# Patient Record
Sex: Female | Born: 1955
Health system: Southern US, Community
[De-identification: ages and names within clinical notes are randomized; demographics above are authoritative.]

## PROBLEM LIST (undated history)

## (undated) DIAGNOSIS — N9089 Other specified noninflammatory disorders of vulva and perineum: Secondary | ICD-10-CM

## (undated) DIAGNOSIS — D369 Benign neoplasm, unspecified site: Secondary | ICD-10-CM

## (undated) DIAGNOSIS — Z8719 Personal history of other diseases of the digestive system: Secondary | ICD-10-CM

## (undated) DIAGNOSIS — K7460 Unspecified cirrhosis of liver: Secondary | ICD-10-CM

## (undated) DIAGNOSIS — Z87440 Personal history of urinary (tract) infections: Secondary | ICD-10-CM

## (undated) DIAGNOSIS — C183 Malignant neoplasm of hepatic flexure: Secondary | ICD-10-CM

## (undated) DIAGNOSIS — D696 Thrombocytopenia, unspecified: Secondary | ICD-10-CM

## (undated) DIAGNOSIS — Z87442 Personal history of urinary calculi: Secondary | ICD-10-CM

## (undated) DIAGNOSIS — N200 Calculus of kidney: Secondary | ICD-10-CM

## (undated) DIAGNOSIS — K649 Unspecified hemorrhoids: Secondary | ICD-10-CM

## (undated) DIAGNOSIS — L732 Hidradenitis suppurativa: Secondary | ICD-10-CM

## (undated) DIAGNOSIS — R161 Splenomegaly, not elsewhere classified: Secondary | ICD-10-CM

## (undated) DIAGNOSIS — K5732 Diverticulitis of large intestine without perforation or abscess without bleeding: Secondary | ICD-10-CM

## (undated) DIAGNOSIS — R7303 Prediabetes: Secondary | ICD-10-CM

## (undated) DIAGNOSIS — K8020 Calculus of gallbladder without cholecystitis without obstruction: Secondary | ICD-10-CM

## (undated) DIAGNOSIS — B1920 Unspecified viral hepatitis C without hepatic coma: Secondary | ICD-10-CM

## (undated) DIAGNOSIS — R002 Palpitations: Secondary | ICD-10-CM

## (undated) DIAGNOSIS — R772 Abnormality of alphafetoprotein: Secondary | ICD-10-CM

## (undated) DIAGNOSIS — D126 Benign neoplasm of colon, unspecified: Principal | ICD-10-CM

## (undated) DIAGNOSIS — R319 Hematuria, unspecified: Secondary | ICD-10-CM

## (undated) HISTORY — PX: BREAST EXCISIONAL BIOPSY: SUR124

## (undated) HISTORY — DX: Palpitations: R00.2

## (undated) HISTORY — DX: Personal history of urinary (tract) infections: Z87.440

## (undated) HISTORY — DX: Hematuria, unspecified: R31.9

## (undated) HISTORY — DX: Hidradenitis suppurativa: L73.2

## (undated) HISTORY — DX: Diverticulitis of large intestine without perforation or abscess without bleeding: K57.32

## (undated) HISTORY — DX: Benign neoplasm of colon, unspecified: D12.6

## (undated) HISTORY — DX: Unspecified hemorrhoids: K64.9

## (undated) HISTORY — DX: Thrombocytopenia, unspecified: D69.6

## (undated) HISTORY — DX: Abnormality of alphafetoprotein: R77.2

## (undated) HISTORY — DX: Benign neoplasm, unspecified site: D36.9

## (undated) HISTORY — DX: Prediabetes: R73.03

## (undated) HISTORY — DX: Splenomegaly, not elsewhere classified: R16.1

## (undated) HISTORY — DX: Other specified noninflammatory disorders of vulva and perineum: N90.89

## (undated) HISTORY — PX: HEMORRHOID BANDING: SHX5850

## (undated) HISTORY — DX: Unspecified cirrhosis of liver: K74.60

## (undated) HISTORY — DX: Calculus of kidney: N20.0

## (undated) HISTORY — DX: Unspecified viral hepatitis C without hepatic coma: B19.20

## (undated) HISTORY — PX: SKIN LESION EXCISION: SHX2412

---

## 1977-05-04 HISTORY — PX: AXILLARY SURGERY: SHX892

## 2000-09-01 ENCOUNTER — Emergency Department (HOSPITAL_COMMUNITY): Admission: EM | Admit: 2000-09-01 | Discharge: 2000-09-01 | Payer: Self-pay | Admitting: *Deleted

## 2000-09-08 ENCOUNTER — Other Ambulatory Visit: Admission: RE | Admit: 2000-09-08 | Discharge: 2000-09-08 | Payer: Self-pay | Admitting: Internal Medicine

## 2000-09-27 ENCOUNTER — Ambulatory Visit (HOSPITAL_COMMUNITY): Admission: RE | Admit: 2000-09-27 | Discharge: 2000-09-27 | Payer: Self-pay | Admitting: Internal Medicine

## 2000-09-27 ENCOUNTER — Encounter: Payer: Self-pay | Admitting: Internal Medicine

## 2000-10-01 ENCOUNTER — Ambulatory Visit (HOSPITAL_COMMUNITY): Admission: RE | Admit: 2000-10-01 | Discharge: 2000-10-01 | Payer: Self-pay | Admitting: Internal Medicine

## 2000-10-01 ENCOUNTER — Encounter: Payer: Self-pay | Admitting: Internal Medicine

## 2001-01-06 ENCOUNTER — Encounter: Payer: Self-pay | Admitting: Internal Medicine

## 2001-01-06 ENCOUNTER — Ambulatory Visit (HOSPITAL_COMMUNITY): Admission: RE | Admit: 2001-01-06 | Discharge: 2001-01-06 | Payer: Self-pay | Admitting: Internal Medicine

## 2001-10-07 ENCOUNTER — Encounter: Payer: Self-pay | Admitting: Internal Medicine

## 2001-10-07 ENCOUNTER — Ambulatory Visit (HOSPITAL_COMMUNITY): Admission: RE | Admit: 2001-10-07 | Discharge: 2001-10-07 | Payer: Self-pay | Admitting: Internal Medicine

## 2001-10-29 ENCOUNTER — Inpatient Hospital Stay (HOSPITAL_COMMUNITY): Admission: EM | Admit: 2001-10-29 | Discharge: 2001-10-30 | Payer: Self-pay | Admitting: Emergency Medicine

## 2001-10-29 ENCOUNTER — Encounter: Payer: Self-pay | Admitting: Emergency Medicine

## 2001-10-31 ENCOUNTER — Encounter: Payer: Self-pay | Admitting: Family Medicine

## 2001-10-31 ENCOUNTER — Ambulatory Visit (HOSPITAL_COMMUNITY): Admission: RE | Admit: 2001-10-31 | Discharge: 2001-10-31 | Payer: Self-pay | Admitting: Family Medicine

## 2002-04-24 ENCOUNTER — Encounter: Payer: Self-pay | Admitting: Family Medicine

## 2002-04-24 ENCOUNTER — Ambulatory Visit (HOSPITAL_COMMUNITY): Admission: RE | Admit: 2002-04-24 | Discharge: 2002-04-24 | Payer: Self-pay | Admitting: Family Medicine

## 2002-11-07 ENCOUNTER — Ambulatory Visit (HOSPITAL_COMMUNITY): Admission: RE | Admit: 2002-11-07 | Discharge: 2002-11-07 | Payer: Self-pay | Admitting: Internal Medicine

## 2002-11-07 ENCOUNTER — Encounter: Payer: Self-pay | Admitting: Internal Medicine

## 2002-12-05 ENCOUNTER — Encounter: Payer: Self-pay | Admitting: Family Medicine

## 2002-12-05 ENCOUNTER — Ambulatory Visit (HOSPITAL_COMMUNITY): Admission: RE | Admit: 2002-12-05 | Discharge: 2002-12-05 | Payer: Self-pay | Admitting: Family Medicine

## 2003-01-05 ENCOUNTER — Ambulatory Visit (HOSPITAL_COMMUNITY): Admission: RE | Admit: 2003-01-05 | Discharge: 2003-01-05 | Payer: Self-pay | Admitting: Family Medicine

## 2003-01-05 ENCOUNTER — Encounter: Payer: Self-pay | Admitting: Family Medicine

## 2003-04-05 ENCOUNTER — Ambulatory Visit (HOSPITAL_COMMUNITY): Admission: RE | Admit: 2003-04-05 | Discharge: 2003-04-05 | Payer: Self-pay | Admitting: Family Medicine

## 2003-08-01 ENCOUNTER — Ambulatory Visit (HOSPITAL_COMMUNITY): Admission: RE | Admit: 2003-08-01 | Discharge: 2003-08-01 | Payer: Self-pay | Admitting: Family Medicine

## 2003-11-13 ENCOUNTER — Ambulatory Visit (HOSPITAL_COMMUNITY): Admission: RE | Admit: 2003-11-13 | Discharge: 2003-11-13 | Payer: Self-pay | Admitting: Family Medicine

## 2004-06-18 ENCOUNTER — Ambulatory Visit (HOSPITAL_COMMUNITY): Admission: RE | Admit: 2004-06-18 | Discharge: 2004-06-18 | Payer: Self-pay | Admitting: Family Medicine

## 2005-01-06 ENCOUNTER — Ambulatory Visit (HOSPITAL_COMMUNITY): Admission: RE | Admit: 2005-01-06 | Discharge: 2005-01-06 | Payer: Self-pay | Admitting: Internal Medicine

## 2005-01-21 ENCOUNTER — Ambulatory Visit (HOSPITAL_COMMUNITY): Admission: RE | Admit: 2005-01-21 | Discharge: 2005-01-21 | Payer: Self-pay | Admitting: Family Medicine

## 2005-01-26 ENCOUNTER — Ambulatory Visit (HOSPITAL_COMMUNITY): Admission: RE | Admit: 2005-01-26 | Discharge: 2005-01-26 | Payer: Self-pay | Admitting: Family Medicine

## 2005-07-31 ENCOUNTER — Ambulatory Visit: Payer: Self-pay | Admitting: Internal Medicine

## 2005-07-31 ENCOUNTER — Ambulatory Visit (HOSPITAL_COMMUNITY): Admission: RE | Admit: 2005-07-31 | Discharge: 2005-07-31 | Payer: Self-pay | Admitting: Internal Medicine

## 2005-07-31 HISTORY — PX: COLONOSCOPY: SHX174

## 2005-09-17 ENCOUNTER — Ambulatory Visit: Payer: Self-pay | Admitting: Internal Medicine

## 2005-10-20 ENCOUNTER — Ambulatory Visit (HOSPITAL_COMMUNITY): Admission: RE | Admit: 2005-10-20 | Discharge: 2005-10-20 | Payer: Self-pay | Admitting: Internal Medicine

## 2005-11-20 ENCOUNTER — Ambulatory Visit: Payer: Self-pay | Admitting: Gastroenterology

## 2006-01-18 ENCOUNTER — Ambulatory Visit (HOSPITAL_COMMUNITY): Admission: RE | Admit: 2006-01-18 | Discharge: 2006-01-18 | Payer: Self-pay | Admitting: Gastroenterology

## 2006-01-18 ENCOUNTER — Encounter (INDEPENDENT_AMBULATORY_CARE_PROVIDER_SITE_OTHER): Payer: Self-pay | Admitting: *Deleted

## 2006-01-22 ENCOUNTER — Ambulatory Visit: Payer: Self-pay | Admitting: Gastroenterology

## 2006-08-26 ENCOUNTER — Ambulatory Visit: Payer: Self-pay | Admitting: Gastroenterology

## 2006-10-23 ENCOUNTER — Encounter: Admission: RE | Admit: 2006-10-23 | Discharge: 2006-10-23 | Payer: Self-pay | Admitting: Gastroenterology

## 2007-12-22 ENCOUNTER — Other Ambulatory Visit: Admission: RE | Admit: 2007-12-22 | Discharge: 2007-12-22 | Payer: Self-pay | Admitting: Obstetrics and Gynecology

## 2007-12-27 ENCOUNTER — Encounter (HOSPITAL_COMMUNITY): Admission: RE | Admit: 2007-12-27 | Discharge: 2008-01-26 | Payer: Self-pay | Admitting: Obstetrics & Gynecology

## 2008-07-04 ENCOUNTER — Ambulatory Visit (HOSPITAL_COMMUNITY): Admission: RE | Admit: 2008-07-04 | Discharge: 2008-07-04 | Payer: Self-pay | Admitting: Obstetrics & Gynecology

## 2008-08-30 ENCOUNTER — Ambulatory Visit: Payer: Self-pay | Admitting: Gastroenterology

## 2008-09-08 ENCOUNTER — Other Ambulatory Visit: Admission: RE | Admit: 2008-09-08 | Discharge: 2008-09-08 | Payer: Self-pay | Admitting: Gastroenterology

## 2008-09-26 ENCOUNTER — Encounter: Payer: Self-pay | Admitting: Internal Medicine

## 2008-09-28 ENCOUNTER — Encounter: Admission: RE | Admit: 2008-09-28 | Discharge: 2008-09-28 | Payer: Self-pay | Admitting: Gastroenterology

## 2008-10-09 ENCOUNTER — Ambulatory Visit (HOSPITAL_COMMUNITY): Admission: RE | Admit: 2008-10-09 | Discharge: 2008-10-09 | Payer: Self-pay | Admitting: Family Medicine

## 2009-01-23 ENCOUNTER — Other Ambulatory Visit: Admission: RE | Admit: 2009-01-23 | Discharge: 2009-01-23 | Payer: Self-pay | Admitting: Obstetrics and Gynecology

## 2009-01-24 ENCOUNTER — Ambulatory Visit (HOSPITAL_COMMUNITY): Payer: Self-pay | Admitting: Internal Medicine

## 2009-01-24 ENCOUNTER — Encounter (HOSPITAL_COMMUNITY): Admission: RE | Admit: 2009-01-24 | Discharge: 2009-01-30 | Payer: Self-pay | Admitting: Oncology

## 2009-01-24 ENCOUNTER — Encounter: Payer: Self-pay | Admitting: Internal Medicine

## 2009-01-25 ENCOUNTER — Ambulatory Visit (HOSPITAL_COMMUNITY): Admission: RE | Admit: 2009-01-25 | Discharge: 2009-01-25 | Payer: Self-pay | Admitting: Obstetrics & Gynecology

## 2009-05-04 HISTORY — PX: BREAST BIOPSY: SHX20

## 2009-08-08 ENCOUNTER — Ambulatory Visit: Payer: Self-pay | Admitting: Gastroenterology

## 2009-08-19 ENCOUNTER — Ambulatory Visit (HOSPITAL_COMMUNITY): Admission: RE | Admit: 2009-08-19 | Discharge: 2009-08-19 | Payer: Self-pay | Admitting: Internal Medicine

## 2009-08-26 ENCOUNTER — Encounter: Admission: RE | Admit: 2009-08-26 | Discharge: 2009-08-26 | Payer: Self-pay | Admitting: Internal Medicine

## 2009-08-27 ENCOUNTER — Ambulatory Visit (HOSPITAL_COMMUNITY): Admission: RE | Admit: 2009-08-27 | Discharge: 2009-08-27 | Payer: Self-pay | Admitting: Family Medicine

## 2009-08-28 ENCOUNTER — Encounter: Admission: RE | Admit: 2009-08-28 | Discharge: 2009-08-28 | Payer: Self-pay | Admitting: Family Medicine

## 2009-09-20 ENCOUNTER — Encounter: Payer: Self-pay | Admitting: Internal Medicine

## 2009-10-02 ENCOUNTER — Other Ambulatory Visit: Admission: RE | Admit: 2009-10-02 | Discharge: 2009-10-02 | Payer: Self-pay | Admitting: Obstetrics and Gynecology

## 2009-10-21 ENCOUNTER — Encounter: Payer: Self-pay | Admitting: Internal Medicine

## 2009-11-19 ENCOUNTER — Emergency Department (HOSPITAL_COMMUNITY): Admission: EM | Admit: 2009-11-19 | Discharge: 2009-11-19 | Payer: Self-pay | Admitting: Emergency Medicine

## 2010-01-23 ENCOUNTER — Encounter (HOSPITAL_COMMUNITY)
Admission: RE | Admit: 2010-01-23 | Discharge: 2010-02-22 | Payer: Self-pay | Source: Home / Self Care | Admitting: Oncology

## 2010-01-23 ENCOUNTER — Ambulatory Visit (HOSPITAL_COMMUNITY): Payer: Self-pay | Admitting: Oncology

## 2010-05-25 ENCOUNTER — Encounter: Payer: Self-pay | Admitting: Family Medicine

## 2010-05-25 ENCOUNTER — Encounter: Payer: Self-pay | Admitting: Internal Medicine

## 2010-05-25 ENCOUNTER — Encounter: Payer: Self-pay | Admitting: Obstetrics and Gynecology

## 2010-05-25 ENCOUNTER — Encounter: Payer: Self-pay | Admitting: Gastroenterology

## 2010-06-05 NOTE — Letter (Signed)
Summary: [facility]  [facility]   Imported By: Ave Filter 09/20/2009 09:56:32  _____________________________________________________________________  External Attachment:    Type:   Image     Comment:   External Document

## 2010-06-05 NOTE — Letter (Signed)
Summary: [facility]  [facility]   Imported By: Rosine Beat 10/21/2009 10:36:38  _____________________________________________________________________  External Attachment:    Type:   Image     Comment:   External Document

## 2010-07-17 LAB — CBC
HCT: 40 % (ref 36.0–46.0)
MCV: 96.8 fL (ref 78.0–100.0)
Platelets: 109 10*3/uL — ABNORMAL LOW (ref 150–400)
RDW: 13 % (ref 11.5–15.5)
WBC: 4.4 10*3/uL (ref 4.0–10.5)

## 2010-07-17 LAB — DIFFERENTIAL
Basophils Relative: 1 % (ref 0–1)
Eosinophils Absolute: 0.1 10*3/uL (ref 0.0–0.7)
Monocytes Absolute: 0.5 10*3/uL (ref 0.1–1.0)
Neutrophils Relative %: 39 % — ABNORMAL LOW (ref 43–77)

## 2010-07-19 LAB — URINE CULTURE

## 2010-07-19 LAB — POCT URINALYSIS DIP (DEVICE)
Bilirubin Urine: NEGATIVE
Glucose, UA: NEGATIVE mg/dL
Ketones, ur: NEGATIVE mg/dL
Nitrite: NEGATIVE
Protein, ur: NEGATIVE mg/dL
Specific Gravity, Urine: 1.02 (ref 1.005–1.030)
Urobilinogen, UA: 0.2 mg/dL (ref 0.0–1.0)

## 2010-08-08 LAB — IRON AND TIBC
Iron: 222 ug/dL — ABNORMAL HIGH (ref 42–135)
UIBC: 117 ug/dL

## 2010-08-08 LAB — CBC
Hemoglobin: 13.9 g/dL (ref 12.0–15.0)
MCHC: 34.7 g/dL (ref 30.0–36.0)
MCV: 97.5 fL (ref 78.0–100.0)
Platelets: 109 10*3/uL — ABNORMAL LOW (ref 150–400)
RDW: 12.8 % (ref 11.5–15.5)

## 2010-08-08 LAB — DIFFERENTIAL
Basophils Relative: 1 % (ref 0–1)
Eosinophils Absolute: 0.1 10*3/uL (ref 0.0–0.7)
Eosinophils Relative: 2 % (ref 0–5)
Monocytes Absolute: 0.3 10*3/uL (ref 0.1–1.0)
Monocytes Relative: 8 % (ref 3–12)
Neutro Abs: 1.5 10*3/uL — ABNORMAL LOW (ref 1.7–7.7)
Neutrophils Relative %: 38 % — ABNORMAL LOW (ref 43–77)

## 2010-08-08 LAB — FERRITIN: Ferritin: 1446 ng/mL — ABNORMAL HIGH (ref 10–291)

## 2010-08-08 LAB — COMPREHENSIVE METABOLIC PANEL
ALT: 150 U/L — ABNORMAL HIGH (ref 0–35)
AST: 155 U/L — ABNORMAL HIGH (ref 0–37)
Alkaline Phosphatase: 92 U/L (ref 39–117)
BUN: 10 mg/dL (ref 6–23)
Calcium: 9.5 mg/dL (ref 8.4–10.5)
GFR calc non Af Amer: >60 mL/min (ref >60)
Glucose, Bld: 87 mg/dL (ref 70–99)
Potassium: 4.2 mEq/L (ref 3.5–5.1)

## 2010-09-19 NOTE — Op Note (Signed)
[name], [name]               ACCOUNT NO.:  [id number]   MEDICAL RECORD NO.:  [id number]          PATIENT TYPE:  AMB   LOCATION:  DAY                           FACILITY:  [facility]   PHYSICIAN:  R. Roetta Sessions, M.D. DATE OF BIRTH:  [birth year]   DATE OF PROCEDURE:  07/31/2005  DATE OF DISCHARGE:  07/31/2005                                 OPERATIVE REPORT   INDICATIONS FOR PROCEDURE:  The patient is a 55 year old African-American  female, seen by Dr. Nobie Putnam for colorectal cancer screening.  She has never  had her lower GI tract imaged.  She has no lower GI tract symptoms and there  is no family history of colorectal neoplasia.  Colonoscopy is now being done  as a screening maneuver.  This approach has been discussed with the patient  at length, potential risks, benefits and alternatives have been reviewed.  Questions answered and agreeable.  Please see documentation in medical  record.   PROCEDURE NOTE:  O2 saturation, blood pressure, pulse on this patient were  monitored throughout the entire procedure.  Conscious sedation Versed 3 mg,  Demerol 50 mg IV in divided doses.   INSTRUMENT:  Olympus video chip system.   FINDINGS:  Digital rectal exam revealed no abnormalities.  Endoscopic prep  was good.   Rectum:  Edematous rectal mucosa.  Retroflexion of the anal verge revealed  no abnormalities.   Colon:  Colonic mucosa was surveyed from the rectosigmoid junction through  the left, transverse, and right colon to the area of the appendiceal  orifice, ileocecal valve, and cecum. These structures were well seen and  photographed for the record. From this level, the scope was slowly  withdrawn, and all previously mentioned mucosal surfaces were again seen.  The colonic mucosa appeared normal.  The patient tolerated the procedure  well and was reactive to endoscopy.   IMPRESSION:  1.  Normal rectum.  2.  Normal colonoscopy.   RECOMMENDATIONS:  Repeat screening colonoscopy ten  years.      Ariana Long, M.D.  Electronically Signed     RMR/MEDQ  D:  07/31/2005  T:  08/03/2005  Job:  [id number]   cc:   Patrica Duel, M.D.  Fax: [phone]

## 2010-09-19 NOTE — H&P (Signed)
[facility]  Patient:    Ariana Long, Ariana Long Visit Number: [id number] MRN: [id number]          Service Type: OUT Location: RAD Attending Physician:  Kirk Ruths Dictated by:   Renne Musca, M.D. Admit Date:  10/31/2001                           History and Physical  DATE OF BIRTH:  Feb 08, 1956  HISTORY OF PRESENT ILLNESS:  The patient is a 55 year old African American female followed by Dr. Sherwood Gambler with past medical history remarkable for hepatitis C, chronic active, followed by Dr. Jena Gauss who has recently presented her PEG interferon/ribavirin therapy.  Three days ago after eating a hamburger developed epigastric fullness, nausea, no vomiting or other symptoms except burping.  She felt fatigued, but otherwise no other symptoms, and it resolved spontaneously.  Yesterday, while driving, she developed left upper arm pain without radiation, and some intermittent left anterior chest pain which persisted throughout most of the day until she fell asleep, and again, noted this morning, and then presented to the emergency room to be "checked out." The patient has no exertional symptoms.  She has had no previous symptoms. She says she has chronic fatigue, but no change in her exercise tolerance. She denies any respiratory complaint, GI complaints, or GU complaints.  She has noted some headache, but not anything out of the ordinary.  The patient is very stressed secondary to family issues as she is caring for her mother since her dad passed away suddenly about a year ago.  She has had no down time.  PAST MEDICAL HISTORY:  Hepatitis C on interferon which she just recently resumed which she takes q.Friday and ribavirin therapy.  She also has a history of a benign lymph node resection many years ago and C-section x2 without complication.  ALLERGIES:  No known drug allergies.  MEDICATIONS:  Interferon q.Friday, ribavirin three q.a.m. and two  q.p.m., Motrin b.i.d. post her interferon treatment, alpha lipoic acid, milk thistle, and multivitamins.  FAMILY MEDICAL HISTORY:  Father died at age 63 of an aortic aneurysm.  Mother is living.  She has diabetes, hypertension, and status post recent CABG.  Two sisters and one brother, alive and well.  Two sons alive and well.  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  No alcohol or tobacco abuse.  She is married with two children.  REVIEW OF SYSTEMS:  A 17 pound weight loss, though now is stable.  PHYSICAL EXAMINATION:  GENERAL:  Reveals a well-developed and well-nourished female who is alert and appropriate.  Neuro status is completely intact.  HEENT:  Oropharynx is moist.  Pupils are equal, round and reactive to light.  NECK:  Supple.  There is no JVD, adenopathy, and no bruits are noted.  The carotid upstrokes are full.  LUNGS:  Clear to auscultation anteriorly and posteriorly.  HEART:  Regular rate and rhythm.  There is a split S2.  No gallop is noted. No murmur is noted.  ABDOMEN:  Soft and nontender.  No mass is noted.  EXTREMITIES:  Without clubbing, cyanosis, or edema.  The dorsalis pedis is intact.  Femorals are intact and full.  SKIN:  Without rash, lesion, or breakdown.  LABORATORY DATA:  White count 2.9, hemoglobin 13, hematocrit 37, platelet count 117, 30 neutrophils, 64 lymphs.  CK 168, CK-MB is 6.7, troponin is 0.01. Her  relative index is 4.  Potassium 3, CO2 35, glucose is 113, SGOT 110, SGPT 95.  No comparisons are available.  Electrocardiogram reveals normal sinus rhythm.  There is some flattening in the T-waves in V3 through V6 and aVL, but no acute ischemic changes are noted. Chest x-ray is reportedly negative.  ASSESSMENT AND PLAN: 1. Chest pain in this 55 year old female who is postmenopausal with CK-MB    positive with normal CK.  Will do serial enzymes.  Most likely will need a    Cardiolite  provided that she remains stable.  The plan was discussed with    the patient. 2. Chronic hepatitis C.  Discuss with Dr. Jena Gauss.  We can hold the Rebetol over    the next day or two, which is what the patient is requesting.  There should    not be any adverse effects. 3. Leukopenia with an absolute neutrophil count of 900 secondary to interferon    and ribavirin therapy. 4. Thrombocytopenia, most likely secondary to her antiviral therapy, possibly    underlying disease, i.e., liver. 5. Stress with probable anxiety.  To consider grief counseling as the patient    becomes very tearful when discussing these issues.  Questioned if she has    ever tried on SSRI.  She certainly would respond to counseling in a    favorable way. 6. Hypokalemia with a CO2 of 35 which may be pseudo.  Will check magnesium,    check TSH.  Replete with 40 mEq of potassium this evening, and can be    followed up as per primary care physician. Dictated by:   Renne Musca, M.D. Attending Physician:  Kirk Ruths DD:  10/29/01 TD:  10/31/01 Job: [id number]

## 2010-09-19 NOTE — Discharge Summary (Signed)
[facility]  Patient:    Ariana Long, Ariana Long Visit Number: [id number] MRN: [id number]          Service Type: OUT Location: RAD Attending Physician:  Kirk Ruths Dictated by:   Karleen Hampshire, M.D. Admit Date:  10/31/2001 Disc. Date: 10/30/01                             Discharge Summary  DISCHARGE DIAGNOSES:  1. Chest pain, myocardial infarction ruled out.  2. Chronic hepatitis C.  HOSPITAL COURSE:  This 55 year old white female stated she had some left upper abdomen and left-sided nonspecific chest pain approximately a week before this admission.  Discomfort began after the patient ate a hamburger but seemed to be relieved by Tums.  The day before admission the patient had eaten a hotdog with subsequent discomfort in the left upper quadrant and left chest, and some mild dyspepsia, also associated with some left arm pain and left neck pain. The patient was seen and evaluated in the emergency room and admitted for rule out MI.  The patients EKG showed regular sinus rhythm with nonspecific ST changes.  Also, the patients cardiac enzymes serially were negative.  The patients WBC was 2900, hemoglobin 13.  The patient had had slightly low WBC before secondary to Rebetol and Interferon which she takes for her hepatitis C, which is followed by Dr. Jena Gauss.  The patient states chest pain was not relieved by nitroglycerin, it just gave her a headache.  She is pain-free after Protonix therapy.  The patient is a nonsmoker.  She does have a family history of coronary artery disease.  She states she has never had any lipid problems in the past.  The patient, again, is pain-free at the time of discharge.  DISPOSITION:  We will arrange for outpatient ultrasound of the gallbladder as well as outpatient stress Cardiolite.  DISCHARGE DIET:  The patient was told to avoid fatty foods.  DISCHARGE MEDICATIONS:  Otherwise, no new medications except  for Protonix.Dictated by:   Karleen Hampshire, M.D. Attending Physician:  Kirk Ruths DD:  10/30/01 TD:  11/01/01 Job: [id number]

## 2010-12-25 ENCOUNTER — Ambulatory Visit (INDEPENDENT_AMBULATORY_CARE_PROVIDER_SITE_OTHER): Payer: Self-pay | Admitting: Gastroenterology

## 2010-12-25 ENCOUNTER — Other Ambulatory Visit: Payer: Self-pay | Admitting: Gastroenterology

## 2010-12-25 DIAGNOSIS — B182 Chronic viral hepatitis C: Secondary | ICD-10-CM

## 2010-12-25 LAB — AFP TUMOR MARKER: AFP-Tumor Marker: 24.4 ng/mL — ABNORMAL HIGH (ref 0.0–8.0)

## 2011-01-08 NOTE — Progress Notes (Signed)
NAME:  Ariana Long, Ariana Long  MR#:  [id number]      DATE:  12/25/2010  DOB:  [birth year]    cc: Consulting Physician:  Glenford Peers, MD, [facility], [address], [place], [place] [address] Primary care physician:  Modena Nunnery, PA-C c/o Emelia Loron., MD, [facility], [address], [place], [place] [address], Fax [phone] Referring Physician:  Jonathon Bellows, MD, [facility], [address], [place], [place] [address], Fax [phone]    REASON FOR FOLLOW UP:  Genotype 1 hepatitis C induced cirrhosis.   History:  The patient returns today unaccompanied. She is a 55 year old woman with genotype 1 hepatitis C. She was treated with 3 months of pegylated interferon and ribavirin in the past, before she was  referred to [place], but had to stop and restart therapy due to cytopenias and other side effects such as tinnitus.  After 3 months her treatment was discontinued. After an incomplete course of therapy, she  was seen at [facility] in 2005 and was told there was no other therapy. She underwent a biopsy on 01/18/2006, showing grade 1-2, stage III disease. She has been untreated since that time. She was last seen on  08/08/2009. Subsequently, she returns without any symptoms referable to her history of hepatitis C. Although the notes from 08/08/2009 indicate that she was supposed to have an upper endoscopy to screen for varices , this was never done.  In addition, she was supposed to be scheduled for an MRI, which was also never done, so that her last imaging of the liver, that I have record of, was an MRI on 09/28/2008, as acknowledged today by the patient. There are no symptoms to suggest decompensated liver disease and there is no history of variceal  bleeding in the past. There is no history of symptoms to suggest cryoglobulin mediated disease.   Past medical history:  Otherwise significant for osteoporosis.    CURRENT MEDICATIONS:  Vitamin D 400 units p.o. daily, calcium 1800 mg p.o. daily. Milk thistle 250 mg p.o. daily, which she was suppose to stop at last visit, Luvena weekly, hydrocortisone 25 mg suppositories p.r.n.    allergies:  None.   habits:  Smoking never smoked. Alcohol, denies interval consumption.   REVIEW OF SYSTEMS:  All 10 systems reviewed today with the patient and they are negative other than which was mentioned above.   PHYSICAL EXAMINATION:  Constitutional:  Well-appearing without significant peripheral wasting. Vital signs: Height 66.5 inches, weight 157 pounds, blood pressure 122/82, pulse of 72, temperature 97.1 Fahrenheit. Ears, nose,  mouth and throat:  Unremarkable oropharynx.  No thyromegaly or neck masses.  Chest:  Resonant to percussion.  Clear to auscultation.  Cardiovascular:  Heart sounds normal S1, S2 without murmurs or rubs.   There is no peripheral edema.  Abdominal:  Normal bowel sounds.  No masses or tenderness.  I could not appreciate a liver edge or spleen tip.  I could not appreciate any hernias.  Lymphatics:  No cervical or  inguinal lymphadenopathy.  Central Nervous System:  No asterixis or focal neurologic findings.  Dermatologic:  Anicteric without palmar  erythema or spider angiomata.  Eyes:  Anicteric sclerae.  Pupils are equal and reactive to light.   LABORATORY STUDIES:  From 12/09/2010, that she brought with her today, hemoglobin 14.3, platelets 136. Creatinine 0.82. White count 5.7. AST was 54, ALT 64,  ALP 93, total bilirubin 0.6, albumin 4.1.  An INR  was not checked.  TSH was 1.551.   ASSESSMENT:  The patient is a 55 year old woman with a history of genotype 1 hepatitis C, who previously was intolerant to a course of pegylated interferon and ribavirin prior to 2005 and a biopsy on 01/18/2006,  showing grade 1-2 stage III disease, IL 28B unknown. The patient remains well compensated.    There may be some indication of progressive fibrosis  in that her platelet count is somewhat low, but is not low  enough to require a screening EGD for varices despite previous notes. Considering the advanced fibrosis, and the previous biopsy, she is out of date for screening for hepatocellular carcinoma.  In terms of treating her hepatitis C, the best we can offer her for now currently is a combination of pegylated interferon and ribavirin with a protease inhibitor. This would not get around the problem of  her ability to tolerate the interferon based regimen. Her cytopenias, however are not significant enough to prevent her from being treated. It would be more whether she is able to tolerate the constitutional  side effects, and psychiatric side effects of therapy.  In my discussion today with the patient, we discussed her previous data. We discussed the fact that the only current available therapy is interferon based, which would not get around some of the side effects  she experienced in the past. She had previously not been interested in treatment. Today she stated that she would like to think about  treatment before committing. We discussed the need to update her MRI, which she is agreeable to.   plan:  1. She is hepatitis A and B immune. 2. She should receive Pneumovax through her primary physician if not already given. 3. She will need an annual flu shot through her primary physician. 4. Will book for MRI in the coming weeks. 5. Will check a PT/INR and AFP. 6. I have given her the name of the drug telaprevir for her to research on her own, as she asked. She knows to contact us if she decides she would like to start on therapy. 7. There is no history of variceal bleeding, and she does not have thrombocytopenia to any significant degree to require an endoscopy to screen for varices at this time.  8. Otherwise, I will see her again in 6 months' time in follow up.             Brooke Dare, MD   ADDENDUM  AFP 24.4   INR 1.01    [phone]    D:  Thu Aug 23 18:43:43 2012 ; T:  Thu Aug 23 20:06:03 2012  Job #:  [id number]

## 2011-01-16 ENCOUNTER — Encounter (HOSPITAL_COMMUNITY): Payer: Self-pay | Admitting: Oncology

## 2011-01-16 ENCOUNTER — Ambulatory Visit (HOSPITAL_COMMUNITY): Payer: Self-pay

## 2011-01-23 ENCOUNTER — Ambulatory Visit (HOSPITAL_COMMUNITY): Payer: Self-pay | Admitting: Oncology

## 2011-01-26 ENCOUNTER — Encounter (HOSPITAL_COMMUNITY): Payer: Self-pay | Admitting: Oncology

## 2011-01-26 ENCOUNTER — Encounter (HOSPITAL_COMMUNITY): Payer: 59 | Attending: Oncology | Admitting: Oncology

## 2011-01-26 DIAGNOSIS — D6959 Other secondary thrombocytopenia: Secondary | ICD-10-CM | POA: Insufficient documentation

## 2011-01-26 DIAGNOSIS — B182 Chronic viral hepatitis C: Secondary | ICD-10-CM | POA: Insufficient documentation

## 2011-01-26 DIAGNOSIS — K7460 Unspecified cirrhosis of liver: Secondary | ICD-10-CM

## 2011-01-26 DIAGNOSIS — R161 Splenomegaly, not elsewhere classified: Secondary | ICD-10-CM

## 2011-01-26 DIAGNOSIS — B1920 Unspecified viral hepatitis C without hepatic coma: Secondary | ICD-10-CM

## 2011-01-26 DIAGNOSIS — R772 Abnormality of alphafetoprotein: Secondary | ICD-10-CM

## 2011-01-26 DIAGNOSIS — D696 Thrombocytopenia, unspecified: Secondary | ICD-10-CM

## 2011-01-26 HISTORY — DX: Unspecified cirrhosis of liver: K74.60

## 2011-01-26 HISTORY — DX: Unspecified viral hepatitis C without hepatic coma: B19.20

## 2011-01-26 HISTORY — DX: Splenomegaly, not elsewhere classified: R16.1

## 2011-01-26 HISTORY — DX: Thrombocytopenia, unspecified: D69.6

## 2011-01-26 HISTORY — DX: Abnormality of alphafetoprotein: R77.2

## 2011-01-26 LAB — CBC
Hemoglobin: 14.7 g/dL (ref 12.0–15.0)
MCH: 32.5 pg (ref 26.0–34.0)
MCHC: 34.7 g/dL (ref 30.0–36.0)
MCV: 93.6 fL (ref 78.0–100.0)

## 2011-01-26 NOTE — Patient Instructions (Addendum)
[facility]  Discharge Instructions  RECOMMENDATIONS MADE BY THE CONSULTANT AND ANY TEST RESULTS WILL BE SENT TO YOUR REFERRING DOCTOR.        SPECIAL INSTRUCTIONS/FOLLOW-UP:Lab work here today and have your MRI as planned. Return to Clinic in 1 year.   I acknowledge that I have been informed and understand all the instructions given to me and received a copy. I do not have any more questions at this time, but understand that I may call the Specialty Clinic at [facility] at [phone] during business hours should I have any further questions or need assistance in obtaining follow-up care.    __________________________________________  _____________  __________ Signature of Patient or Authorized Representative            Date                   Time    __________________________________________ Nurse's Signature

## 2011-01-26 NOTE — Progress Notes (Signed)
No primary provider on file. No primary provider on file.  1. Thrombocytopenia   2. Hepatitis C   3. Splenomegaly   4. Cirrhosis of liver   5. Elevated AFP     INTERVAL HISTORY: Ariana Long 55 y.o. female returns for  regular  visit for followup of thrombocytopenia secondary to liver cirrhosis secondary to chronic hepatitis C and splenomegaly.  The patient and I spent some time going over her recent lab work performed by her PCP.  I answered the patient's questions regarding the lab tests.  The patient reports BRBPR which she associates with hemorrhoids.  She admits to occasional straining with bowel movements.  She denies any blood in stool, black tarry stool, and blood tinged toilet bowl water.    The patient denies any other complaints.  She is working 3rd shift at [employer].   I personally reviewed and went over laboratory results with the patient.  The patient's most recent AFP is increased.  We will check that again today.  I explained that this value is slightly suspicious.  She explains that Dr. Brooke Dare has ordered an MRI of abdomen.  She will call his office today to follow-up on scheduling of that.  I stressed the importance of that test.  She understands that the AFP is elevated in hepatocellular carcinoma.  Past Medical History  Diagnosis Date  . Osteoporosis   . Hepatitis C   . Thrombocytopenia   . Hidradenitis   . Thrombocytopenia 01/26/2011  . Hepatitis C 01/26/2011  . Splenomegaly 01/26/2011  . Cirrhosis of liver 01/26/2011  . Elevated AFP 01/26/2011    has Thrombocytopenia; Hepatitis C; Splenomegaly; Cirrhosis of liver; and Elevated AFP on her problem list.      has no known allergies.  Ms. Wages does not currently have medications on file.  Past Surgical History  Procedure Date  . Cesarean section     x 2  . Axillary gland removal     Denies any headaches, dizziness, double vision, fevers, chills, night sweats, nausea, vomiting, diarrhea,  constipation, chest pain, heart palpitations, shortness of breath, blood in stool, black tarry stool, urinary pain, urinary burning, urinary frequency, hematuria.   PHYSICAL EXAMINATION  ECOG PERFORMANCE STATUS: 0 - Asymptomatic  Filed Vitals:   01/26/11 1040  BP: 124/83  Pulse: 63  Temp: 98 F (36.7 C)    GENERAL:alert, healthy, no distress, well nourished, well developed, comfortable, cooperative and smiling SKIN: skin color, texture, turgor are normal HEAD: Normocephalic EYES: normal EARS: External ears normal OROPHARYNX:mucous membranes are moist  NECK: trachea midline LYMPH:  no hepatosplenomegaly BREAST:not examined LUNGS: clear to auscultation and percussion HEART: regular rate & rhythm, no murmurs, no gallops, S1 normal and S2 normal ABDOMEN:abdomen soft, non-tender, normal bowel sounds and no hepatosplenomegaly BACK: Back symmetric, no curvature., No CVA tenderness EXTREMITIES:less then 2 second capillary refill, no joint deformities, effusion, or inflammation, no edema, no skin discoloration, no clubbing, no cyanosis  NEURO: alert & oriented x 3 with fluent speech, no focal motor/sensory deficits, gait normal   LABORATORY DATA:  Recent lab work performed by her PCP reveals a WBC 5.7, Hgb 14.3, Hct 42.1%, and platelets 136.  Na 140, K+ 4.0, Cl- 104, BUN 15, Creat 0.82, Alk Phos 93, AST 54, ALT 64.   PENDING LABS: AFP and CBC    ASSESSMENT:  1. Cirrhosis of liver 2. Splenomegaly 3. Thrombocytopenia secondary to 1 and 2 4. Chronic Hepatits C, followed by Dr. Brooke Dare   PLAN:  1. Lab work today: CBC, AFP 2. Follow-up in one year.  3. MRI of abdomen with and without contrast ordered and scheduled by Dr. Brooke Dare.  This test is important to be performed in light of an increased AFP level. 4. Recommended Preparation H or Tucks pads for hemorrhoids. 5. Encouraged the patient to increase fiber and fluids in diet.  Suggested trying a stool softener. 6. I  personally reviewed and went over laboratory results with the patient. 7. Answered all questions regarding recent lab work obtained by her PCP.  Will scan these results into [facility].   All questions were answered. The patient knows to call the clinic with any problems, questions or concerns. We can certainly see the patient much sooner if necessary.  The patient and plan discussed with Glenford Peers, MD and he is in agreement with the aforementioned.  More than 50% of the time spent with the patient was utilized for counseling.   KEFALAS,THOMAS

## 2011-01-28 ENCOUNTER — Telehealth (HOSPITAL_COMMUNITY): Payer: Self-pay

## 2011-01-28 NOTE — Telephone Encounter (Signed)
Per patient she will be having the MRI next week.

## 2011-01-30 ENCOUNTER — Other Ambulatory Visit: Payer: Self-pay | Admitting: Adult Health

## 2011-01-30 ENCOUNTER — Other Ambulatory Visit (HOSPITAL_COMMUNITY)
Admission: RE | Admit: 2011-01-30 | Discharge: 2011-01-30 | Disposition: A | Discharge status: Home / Self Care | Payer: 59 | Source: Ambulatory Visit | Attending: Obstetrics and Gynecology | Admitting: Obstetrics and Gynecology

## 2011-01-30 ENCOUNTER — Other Ambulatory Visit: Payer: Self-pay | Admitting: Obstetrics & Gynecology

## 2011-01-30 DIAGNOSIS — Z01419 Encounter for gynecological examination (general) (routine) without abnormal findings: Secondary | ICD-10-CM | POA: Insufficient documentation

## 2011-01-30 DIAGNOSIS — M8580 Other specified disorders of bone density and structure, unspecified site: Secondary | ICD-10-CM

## 2011-02-02 ENCOUNTER — Other Ambulatory Visit: Payer: Self-pay | Admitting: Gastroenterology

## 2011-02-02 DIAGNOSIS — B1920 Unspecified viral hepatitis C without hepatic coma: Secondary | ICD-10-CM

## 2011-02-04 ENCOUNTER — Other Ambulatory Visit: Payer: Self-pay | Admitting: Adult Health

## 2011-02-04 DIAGNOSIS — Z139 Encounter for screening, unspecified: Secondary | ICD-10-CM

## 2011-02-05 ENCOUNTER — Ambulatory Visit (HOSPITAL_COMMUNITY): Payer: 59

## 2011-02-06 ENCOUNTER — Other Ambulatory Visit (HOSPITAL_COMMUNITY): Payer: 59

## 2011-02-07 ENCOUNTER — Other Ambulatory Visit: Payer: 59

## 2011-02-09 ENCOUNTER — Ambulatory Visit
Admission: RE | Admit: 2011-02-09 | Discharge: 2011-02-09 | Disposition: A | Discharge status: Home / Self Care | Payer: 59 | Source: Ambulatory Visit | Attending: Gastroenterology | Admitting: Gastroenterology

## 2011-02-09 DIAGNOSIS — B1920 Unspecified viral hepatitis C without hepatic coma: Secondary | ICD-10-CM

## 2011-02-09 MED ORDER — GADOBENATE DIMEGLUMINE 529 MG/ML IV SOLN
15.0000 mL | Freq: Once | INTRAVENOUS | Status: AC | PRN
  Administered 2011-02-09: 15 mL via INTRAVENOUS

## 2011-02-13 ENCOUNTER — Ambulatory Visit (HOSPITAL_COMMUNITY): Payer: 59

## 2011-02-13 ENCOUNTER — Ambulatory Visit (HOSPITAL_COMMUNITY)
Admission: RE | Admit: 2011-02-13 | Discharge: 2011-02-13 | Disposition: A | Discharge status: Home / Self Care | Payer: 59 | Source: Ambulatory Visit | Attending: Adult Health | Admitting: Adult Health

## 2011-02-13 DIAGNOSIS — Z139 Encounter for screening, unspecified: Secondary | ICD-10-CM

## 2011-02-13 DIAGNOSIS — Z1231 Encounter for screening mammogram for malignant neoplasm of breast: Secondary | ICD-10-CM | POA: Insufficient documentation

## 2011-02-20 ENCOUNTER — Ambulatory Visit (HOSPITAL_COMMUNITY)
Admission: RE | Admit: 2011-02-20 | Discharge: 2011-02-20 | Disposition: A | Discharge status: Home / Self Care | Payer: 59 | Source: Ambulatory Visit | Attending: Obstetrics & Gynecology | Admitting: Obstetrics & Gynecology

## 2011-02-20 DIAGNOSIS — Z780 Asymptomatic menopausal state: Secondary | ICD-10-CM | POA: Insufficient documentation

## 2011-02-20 DIAGNOSIS — M8580 Other specified disorders of bone density and structure, unspecified site: Secondary | ICD-10-CM

## 2011-02-20 DIAGNOSIS — M899 Disorder of bone, unspecified: Secondary | ICD-10-CM | POA: Insufficient documentation

## 2011-02-24 ENCOUNTER — Encounter (HOSPITAL_COMMUNITY): Payer: Self-pay

## 2011-03-19 ENCOUNTER — Encounter: Payer: Self-pay | Admitting: Gastroenterology

## 2011-03-20 ENCOUNTER — Other Ambulatory Visit: Payer: Self-pay | Admitting: Gastroenterology

## 2011-09-03 ENCOUNTER — Encounter: Payer: Self-pay | Admitting: Internal Medicine

## 2011-09-04 ENCOUNTER — Encounter: Payer: Self-pay | Admitting: Gastroenterology

## 2011-09-04 ENCOUNTER — Encounter: Payer: Self-pay | Admitting: Urgent Care

## 2011-09-04 ENCOUNTER — Ambulatory Visit (INDEPENDENT_AMBULATORY_CARE_PROVIDER_SITE_OTHER): Payer: 59 | Admitting: Urgent Care

## 2011-09-04 VITALS — BP 117/75 | HR 63 | Temp 97.3°F | Ht 66.5 in | Wt 160.8 lb

## 2011-09-04 DIAGNOSIS — B1920 Unspecified viral hepatitis C without hepatic coma: Secondary | ICD-10-CM

## 2011-09-04 DIAGNOSIS — R799 Abnormal finding of blood chemistry, unspecified: Secondary | ICD-10-CM

## 2011-09-04 DIAGNOSIS — K921 Melena: Secondary | ICD-10-CM | POA: Insufficient documentation

## 2011-09-04 DIAGNOSIS — K602 Anal fissure, unspecified: Secondary | ICD-10-CM | POA: Insufficient documentation

## 2011-09-04 DIAGNOSIS — R772 Abnormality of alphafetoprotein: Secondary | ICD-10-CM

## 2011-09-04 MED ORDER — PEG-KCL-NACL-NASULF-NA ASC-C 100 G PO SOLR: 1.0000 | Freq: Once | ORAL | Status: DC

## 2011-09-04 MED ORDER — NITROGLYCERIN 0.4 % RE OINT: 1.0000 [in_us] | TOPICAL_OINTMENT | Freq: Two times a day (BID) | RECTAL | Status: DC

## 2011-09-04 NOTE — Assessment & Plan Note (Signed)
Ariana Long is a pleasant 56 y.o. female with recent small volume hematochezia suspected secondary to hemorrhoids & anal fissure.  Two tiny external ulcers query traumatic excoriation injury secondary to itching.  Last colonoscopy over 6 years ago.  Colonoscopy with Dr Jena Gauss to help differentiate source of hematochezia.  I have discussed risks & benefits which include, but are not limited to, bleeding, infection, perforation & drug reaction.  The patient agrees with this plan & written consent will be obtained.    Rectiv BID-discussed application, possible side effects Discussed hygeine Continue proctofoam, alternate with [place]

## 2011-09-04 NOTE — Patient Instructions (Addendum)
Begin rectiv twice daily for up to 3 weeks You will need a colonoscopy with Dr. Jena Gauss

## 2011-09-04 NOTE — Progress Notes (Signed)
Referring Provider: [name], MD Primary Care Physician:  [name], MD, MD Primary Gastroenterologist:  Dr. Jena Gauss  Chief Complaint  Patient presents with  . Rectal Pain    tear    HPI:  Ariana Long is a 56 y.o. female here as a referral from Dr. Phillips Odor for proctalgia & hematochezia.  Gives 3 month hx "hemorrhoids".  Dx with internal hemorrhoids & treated w/ anusol suppositories & proctofoam.  Minimal relief.  C/o feeling a "tear" in rectum w/ severe proctalgia w/ defecation.  Using dial soap, getting better with imorved hygeine & attention to this area.  She has seen bright red small amts of blood on tissue with wiping.  Tried miralax for hard stools.  Having BM normal 2-3 per day.  Denies abdominal pain, nausea, vomiting, anorexia, or weight loss.  .Denies heartburn or indigestion.  Occasional Ibuprofen.  No other NSAIDS.   Past Medical History  Diagnosis Date  . Osteoporosis   . Hidradenitis   . Thrombocytopenia 01/26/2011  . Hepatitis C 01/26/2011  . Splenomegaly 01/26/2011  . Cirrhosis of liver 01/26/2011    [facility] [place]-nonresponder-HCC surveillance  . Elevated AFP 01/26/2011    Past Surgical History  Procedure Date  . Cesarean section     x 2  . Axillary gland removal   . Colonoscopy 07/31/2005    [facility]k-Normal rectum/Normal colonoscopy/Repeat screening colonoscopy ten years    Current Outpatient Prescriptions  Medication Sig Dispense Refill  . calcium carbonate (OS-CAL) 600 MG TABS Take 1,800 mg by mouth daily.        . cholecalciferol (VITAMIN D) 1000 UNITS tablet Take 800 Units by mouth daily.        . magnesium oxide (MAG-OX) 400 MG tablet Take 200 mg by mouth 3 (three) times daily.        . milk thistle 175 MG tablet Take 175 mg by mouth daily.        Marland Kitchen omega-3 acid ethyl esters (LOVAZA) 1 G capsule Take 1 g by mouth daily.        . pramoxine-hydrocortisone 1-1 % foam Apply topically 3 (three) times daily.        Allergies as of  09/04/2011  . (No Known Allergies)    Family History:There is no known family history of colorectal carcinoma , liver disease, or inflammatory bowel disease.  Problem Relation Age of Onset  . Heart attack Mother   . Stroke Mother   . Aneurysm Father     History   Social History  . Marital Status: Married    Spouse Name: N/A    Number of Children: 2  . Years of Education: N/A   Occupational History  . [employer]   Social History Main Topics  . Smoking status: Never Smoker   . Smokeless tobacco: Never Used  . Alcohol Use: No  . Drug Use: No  . Sexually Active: Not on file  Review of Systems: Gen: Denies any fever, chills, sweats, anorexia, fatigue, weakness, malaise, weight loss, and sleep disorder CV: Denies chest pain, angina, palpitations, syncope, orthopnea, PND, peripheral edema, and claudication. Resp: Denies dyspnea at rest, dyspnea with exercise, cough, sputum, wheezing, coughing up blood, and pleurisy. GI: Denies vomiting blood, jaundice, and fecal incontinence.   Denies dysphagia or odynophagia. GU : Denies urinary burning, blood in urine, urinary frequency, urinary hesitancy, nocturnal urination, and urinary incontinence. MS: Denies joint pain, limitation of movement, and swelling, stiffness, low back pain, extremity pain. Denies muscle  weakness, cramps, atrophy.  Derm: Denies rash, itching, dry skin, hives, moles, warts, or unhealing ulcers.  Psych: Denies depression, anxiety, memory loss, suicidal ideation, hallucinations, paranoia, and confusion. Heme: Denies bruising, bleeding, and enlarged lymph nodes. Neuro:  Denies any headaches, dizziness, paresthesias. Endo:  Denies any problems with DM, thyroid, adrenal function.  Physical Exam: BP 117/75  Pulse 63  Temp(Src) 97.3 F (36.3 C) (Temporal)  Ht 5' 6.5" (1.689 m)  Wt 160 lb 12.8 oz (72.938 kg)  BMI 25.56 kg/m2 General:   Alert,  Well-developed, well-nourished, pleasant and cooperative  in NAD Head:  Normocephalic and atraumatic. Eyes:  Sclera clear, no icterus.   Conjunctiva pink. Ears:  Normal auditory acuity. Nose:  No deformity, discharge, or lesions. Mouth:  No deformity or lesions,oropharynx pink & moist. Neck:  Supple; no masses or thyromegaly. Lungs:  Clear throughout to auscultation.   No wheezes, crackles, or rhonchi. No acute distress. Heart:  Regular rate and rhythm; no murmurs, clicks, rubs,  or gallops. Abdomen:  Normal bowel sounds.  No bruits.  Soft, non-tender and non-distended without masses, hepatosplenomegaly or hernias noted.  No guarding or rebound tenderness.   Rectal:  2 very small punctate external anal ulcerated lesions & linear fissure extending from one,  Small external hemorrhoids.  No active bleeding.  Internal exam not completed. Msk:  Symmetrical without gross deformities. Normal posture. Pulses:  Normal pulses noted. Extremities:  No clubbing or edema. Neurologic:  Alert and oriented x4;  grossly normal neurologically. Skin:  Intact without significant lesions or rashes. Lymph Nodes:  No significant cervical adenopathy. Psych:  Alert and cooperative. Normal mood and affect.

## 2011-09-07 NOTE — Progress Notes (Signed)
Faxed to PCP

## 2011-09-22 ENCOUNTER — Encounter (HOSPITAL_COMMUNITY): Payer: Self-pay | Admitting: Pharmacy Technician

## 2011-09-25 ENCOUNTER — Ambulatory Visit (HOSPITAL_COMMUNITY): Admission: RE | Admit: 2011-09-25 | Payer: 59 | Source: Ambulatory Visit | Admitting: Internal Medicine

## 2011-09-25 ENCOUNTER — Encounter (HOSPITAL_COMMUNITY): Admission: RE | Payer: Self-pay | Source: Ambulatory Visit

## 2011-09-25 SURGERY — COLONOSCOPY
Anesthesia: Moderate Sedation

## 2011-10-03 DIAGNOSIS — N200 Calculus of kidney: Secondary | ICD-10-CM

## 2011-10-03 HISTORY — DX: Calculus of kidney: N20.0

## 2011-10-22 ENCOUNTER — Ambulatory Visit (INDEPENDENT_AMBULATORY_CARE_PROVIDER_SITE_OTHER): Payer: 59 | Admitting: Urgent Care

## 2011-10-22 ENCOUNTER — Encounter: Payer: Self-pay | Admitting: Urgent Care

## 2011-10-22 ENCOUNTER — Other Ambulatory Visit: Payer: Self-pay | Admitting: Internal Medicine

## 2011-10-22 VITALS — BP 112/72 | HR 71 | Temp 98.3°F | Ht 66.0 in | Wt 158.8 lb

## 2011-10-22 DIAGNOSIS — B1920 Unspecified viral hepatitis C without hepatic coma: Secondary | ICD-10-CM

## 2011-10-22 DIAGNOSIS — K921 Melena: Secondary | ICD-10-CM

## 2011-10-22 MED ORDER — PEG-KCL-NACL-NASULF-NA ASC-C 100 G PO SOLR: 1.0000 | Freq: Once | ORAL | Status: DC

## 2011-10-22 NOTE — Progress Notes (Signed)
Faxed to PCP

## 2011-10-22 NOTE — Progress Notes (Signed)
Referring Provider: [name], MD Primary Care Physician:  [name], MD Primary Gastroenterologist:  Dr. Jena Gauss  Chief Complaint  Patient presents with  . Follow-up    reschedule colonoscopy    HPI:  Ariana Long is a 56 y.o. female here to reschedule colonoscopy.  She was seen by me w/ hematochezia May 2013.  Colonoscopy was postponed due to her husband having an MI & stent placement.  She is here to reschedule.  No major changes since last office visit.  She still occasionally sees scant bright red bleeding especially w/ hard BMs.  Completed rectiv & proctofoam for anal fissure & this helped tremendously.  Overall, she feels much better.   Denies abdominal pain, nausea, vomiting, anorexia, or weight loss. Past Medical History  Diagnosis Date  . Osteoporosis   . Hidradenitis   . Thrombocytopenia 01/26/2011  . Hepatitis C 01/26/2011    2005 non responder, genotype 1, Gr 1-2, Stage 3   . Splenomegaly 01/26/2011  . Cirrhosis of liver 01/26/2011    [facility] [place]nonresponder-HCC surveillance  . Elevated AFP 01/26/2011    Past Surgical History  Procedure Date  . Cesarean section     x 2  . Axillary gland removal   . Colonoscopy 07/31/2005    [facility]-Normal rectum/Normal colonoscopy/Repeat screening colonoscopy ten years    Current Outpatient Prescriptions  Medication Sig Dispense Refill  . calcium carbonate (OS-CAL) 600 MG TABS Take 1,200-1,800 mg by mouth daily.       . Cholecalciferol (VITAMIN D-3 PO) Take 4,000 Units by mouth daily.      . magnesium oxide (MAG-OX) 400 MG tablet Take 400 mg by mouth 2 (two) times daily.       . milk thistle 175 MG tablet Take 175 mg by mouth daily.        . Milk Thistle 250 MG CAPS Take 1 capsule by mouth daily.      . vitamin E 400 UNIT capsule Take 400 Units by mouth daily.      . peg 3350 powder (MOVIPREP) 100 G SOLR Take 1 kit (100 g total) by mouth once. As directed Please purchase 1 Fleets enema to use with the  prep  1 kit  0    Allergies as of 10/22/2011  . (No Known Allergies)    Family History:There is no known family history of colorectal carcinoma , liver disease, or inflammatory bowel disease.  Problem Relation Age of Onset  . Heart attack Mother   . Stroke Mother   . Aneurysm Father     History   Social History  . Marital Status: Married    Spouse Name: N/A    Number of Children: 2  . Years of Education: N/A   Occupational History  . [employer]   Social History Main Topics  . Smoking status: Never Smoker   . Smokeless tobacco: Never Used  . Alcohol Use: No  . Drug Use: No  . Sexually Active: Not on file  Review of Systems: Gen: Denies any fever, chills, sweats, anorexia, fatigue, weakness, malaise, weight loss, and sleep disorder CV: Denies chest pain, angina, palpitations, syncope, orthopnea, PND, peripheral edema, and claudication. Resp: Denies dyspnea at rest, dyspnea with exercise, cough, sputum, wheezing, coughing up blood, and pleurisy. GI: Denies vomiting blood, jaundice, and fecal incontinence.   Denies dysphagia or odynophagia. GU : Denies urinary burning, blood in urine, urinary frequency, urinary hesitancy, nocturnal urination, and urinary incontinence. MS: Denies joint pain, limitation  of movement, and swelling, stiffness, low back pain, extremity pain. Denies muscle weakness, cramps, atrophy.  Derm: Denies rash, itching, dry skin, hives, moles, warts, or unhealing ulcers.  Psych: Denies depression, anxiety, memory loss, suicidal ideation, hallucinations, paranoia, and confusion. Heme: Denies bruising, bleeding, and enlarged lymph nodes. Neuro:  Denies any headaches, dizziness, paresthesias. Endo:  Denies any problems with DM, thyroid, adrenal function.  Physical Exam: BP 112/72  Pulse 71  Temp 98.3 F (36.8 C) (Temporal)  Ht 5\' 6"  (1.676 m)  Wt 158 lb 12.8 oz (72.031 kg)  BMI 25.63 kg/m2 General:   Alert,  Well-developed,  well-nourished, pleasant and cooperative in NAD Head:  Normocephalic and atraumatic. Eyes:  Sclera clear, no icterus.   Conjunctiva pink. Ears:  Normal auditory acuity. Nose:  No deformity, discharge, or lesions. Mouth:  No deformity or lesions,oropharynx pink & moist. Neck:  Supple; no masses or thyromegaly. Lungs:  Clear throughout to auscultation.   No wheezes, crackles, or rhonchi. No acute distress. Heart:  Regular rate and rhythm; no murmurs, clicks, rubs,  or gallops. Abdomen:  Normal bowel sounds.  No bruits.  Soft, non-tender and non-distended without masses, hepatosplenomegaly or hernias noted.  No guarding or rebound tenderness.   Rectal:  Deferred. Msk:  Symmetrical without gross deformities. Normal posture. Pulses:  Normal pulses noted. Extremities:  No clubbing or edema. Neurologic:  Alert and oriented x4;  grossly normal neurologically. Skin:  Intact without significant lesions or rashes. Lymph Nodes:  No significant cervical adenopathy. Psych:  Alert and cooperative. Normal mood and affect.

## 2011-10-22 NOTE — Patient Instructions (Addendum)
Colonoscopy w/ Dr Jena Gauss Call if you decide to proceed with upper endoscopy (EGD) at the same time

## 2011-10-22 NOTE — Assessment & Plan Note (Addendum)
Pt offered EGD to screen for varices at the same time as colonoscopy.  Pt declined.  She should FU w/ Dr Jacqualine Mau.

## 2011-10-22 NOTE — Assessment & Plan Note (Signed)
Ariana Long is a pleasant 56 y.o. female here to reschedule colonoscopy for recent small volume hematochezia.  Recently treated hemorrhoids & anal fissure w/ continued hematochezia.  Last colonoscopy 2007.  I have discussed risks & benefits which include, but are not limited to, bleeding, infection, perforation & drug reaction.  The patient agrees with this plan & written consent will be obtained.

## 2011-10-30 ENCOUNTER — Encounter (HOSPITAL_COMMUNITY): Payer: Self-pay | Admitting: Pharmacy Technician

## 2011-10-30 ENCOUNTER — Encounter (HOSPITAL_COMMUNITY): Payer: Self-pay | Admitting: *Deleted

## 2011-10-30 ENCOUNTER — Emergency Department (HOSPITAL_COMMUNITY): Payer: 59

## 2011-10-30 ENCOUNTER — Emergency Department (HOSPITAL_COMMUNITY)
Admission: EM | Admit: 2011-10-30 | Discharge: 2011-10-30 | Disposition: A | Discharge status: Home / Self Care | Payer: 59 | Attending: Emergency Medicine | Admitting: Emergency Medicine

## 2011-10-30 DIAGNOSIS — R1084 Generalized abdominal pain: Secondary | ICD-10-CM | POA: Insufficient documentation

## 2011-10-30 DIAGNOSIS — N390 Urinary tract infection, site not specified: Secondary | ICD-10-CM

## 2011-10-30 DIAGNOSIS — Z79899 Other long term (current) drug therapy: Secondary | ICD-10-CM | POA: Insufficient documentation

## 2011-10-30 DIAGNOSIS — N200 Calculus of kidney: Secondary | ICD-10-CM

## 2011-10-30 DIAGNOSIS — R10817 Generalized abdominal tenderness: Secondary | ICD-10-CM | POA: Insufficient documentation

## 2011-10-30 LAB — URINALYSIS, ROUTINE W REFLEX MICROSCOPIC
Glucose, UA: NEGATIVE mg/dL
Specific Gravity, Urine: 1.03 (ref 1.005–1.030)

## 2011-10-30 LAB — COMPREHENSIVE METABOLIC PANEL
ALT: 87 U/L — ABNORMAL HIGH (ref 0–35)
Alkaline Phosphatase: 104 U/L (ref 39–117)
CO2: 23 mEq/L (ref 19–32)
Chloride: 101 mEq/L (ref 96–112)
GFR calc Af Amer: 85 mL/min — ABNORMAL LOW (ref >90)
GFR calc non Af Amer: 73 mL/min — ABNORMAL LOW (ref >90)
Glucose, Bld: 123 mg/dL — ABNORMAL HIGH (ref 70–99)
Potassium: 3.4 mEq/L — ABNORMAL LOW (ref 3.5–5.1)
Sodium: 138 mEq/L (ref 135–145)
Total Protein: 8.4 g/dL — ABNORMAL HIGH (ref 6.0–8.3)

## 2011-10-30 LAB — CBC WITH DIFFERENTIAL/PLATELET
Lymphocytes Relative: 47 % — ABNORMAL HIGH (ref 12–46)
Lymphs Abs: 2.4 10*3/uL (ref 0.7–4.0)
Neutro Abs: 2.1 10*3/uL (ref 1.7–7.7)
Neutrophils Relative %: 42 % — ABNORMAL LOW (ref 43–77)
Platelets: 116 10*3/uL — ABNORMAL LOW (ref 150–400)
RBC: 4.41 MIL/uL (ref 3.87–5.11)
WBC: 5.1 10*3/uL (ref 4.0–10.5)

## 2011-10-30 LAB — PREGNANCY, URINE: Preg Test, Ur: NEGATIVE

## 2011-10-30 MED ORDER — ONDANSETRON HCL 4 MG PO TABS: 4.0000 mg | ORAL_TABLET | Freq: Four times a day (QID) | ORAL | Status: AC

## 2011-10-30 MED ORDER — SODIUM CHLORIDE 0.9 % IV BOLUS (SEPSIS)
1000.0000 mL | Freq: Once | INTRAVENOUS | Status: AC
  Administered 2011-10-30: 1000 mL via INTRAVENOUS

## 2011-10-30 MED ORDER — OXYCODONE-ACETAMINOPHEN 5-325 MG PO TABS: 2.0000 | ORAL_TABLET | ORAL | Status: AC | PRN

## 2011-10-30 MED ORDER — IBUPROFEN 800 MG PO TABS: 800.0000 mg | ORAL_TABLET | Freq: Three times a day (TID) | ORAL | Status: AC

## 2011-10-30 MED ORDER — ONDANSETRON HCL 4 MG/2ML IJ SOLN
4.0000 mg | Freq: Once | INTRAMUSCULAR | Status: AC
Start: 2011-10-30 — End: 2011-10-30
  Administered 2011-10-30: 4 mg via INTRAVENOUS
  Filled 2011-10-30: qty 2

## 2011-10-30 MED ORDER — HYDROMORPHONE HCL PF 1 MG/ML IJ SOLN
1.0000 mg | Freq: Once | INTRAMUSCULAR | Status: AC
  Administered 2011-10-30: 1 mg via INTRAVENOUS
  Filled 2011-10-30: qty 1

## 2011-10-30 MED ORDER — ONDANSETRON HCL 4 MG/2ML IJ SOLN
INTRAMUSCULAR | Status: AC
  Administered 2011-10-30: 4 mg via INTRAVENOUS
  Filled 2011-10-30: qty 2

## 2011-10-30 MED ORDER — IOHEXOL 300 MG/ML  SOLN
100.0000 mL | Freq: Once | INTRAMUSCULAR | Status: AC | PRN
  Administered 2011-10-30: 100 mL via INTRAVENOUS

## 2011-10-30 MED ORDER — ONDANSETRON 8 MG PO TBDP
8.0000 mg | ORAL_TABLET | Freq: Once | ORAL | Status: AC
  Administered 2011-10-30: 8 mg via ORAL
  Filled 2011-10-30: qty 1

## 2011-10-30 MED ORDER — MORPHINE SULFATE 4 MG/ML IJ SOLN
4.0000 mg | Freq: Once | INTRAMUSCULAR | Status: AC
  Administered 2011-10-30: 4 mg via INTRAVENOUS
  Filled 2011-10-30: qty 1

## 2011-10-30 MED ORDER — CIPROFLOXACIN HCL 500 MG PO TABS: 500.0000 mg | ORAL_TABLET | Freq: Two times a day (BID) | ORAL | Status: AC

## 2011-10-30 MED ORDER — ONDANSETRON HCL 4 MG/2ML IJ SOLN
4.0000 mg | Freq: Once | INTRAMUSCULAR | Status: AC
  Administered 2011-10-30: 4 mg via INTRAVENOUS

## 2011-10-30 NOTE — ED Notes (Signed)
Reports last normal BM yesterday.

## 2011-10-30 NOTE — ED Notes (Signed)
Lower abd pain radiating to left side that started this morning with nausea.  Reports has had several BM's this morning.  Denies vomiting.

## 2011-10-30 NOTE — Discharge Instructions (Signed)
Kidney Stones Take your medications as prescribed. Follow up with Dr. Jerre Simon on Monday. Return to the ED if you develop new or worsening symptoms. Kidney stones (ureteral lithiasis) are deposits that form inside your kidneys. The intense pain is caused by the stone moving through the urinary tract. When the stone moves, the ureter goes into spasm around the stone. The stone is usually passed in the urine.  CAUSES   A disorder that makes certain neck glands produce too much parathyroid hormone (primary hyperparathyroidism).   A buildup of uric acid crystals.   Narrowing (stricture) of the ureter.   A kidney obstruction present at birth (congenital obstruction).   Previous surgery on the kidney or ureters.   Numerous kidney infections.  SYMPTOMS   Feeling sick to your stomach (nauseous).   Throwing up (vomiting).   Blood in the urine (hematuria).   Pain that usually spreads (radiates) to the groin.   Frequency or urgency of urination.  DIAGNOSIS   Taking a history and physical exam.   Blood or urine tests.   Computerized X-ray scan (CT scan).   Occasionally, an examination of the inside of the urinary bladder (cystoscopy) is performed.  TREATMENT   Observation.   Increasing your fluid intake.   Surgery may be needed if you have severe pain or persistent obstruction.  The size, location, and chemical composition are all important variables that will determine the proper choice of action for you. Talk to your caregiver to better understand your situation so that you will minimize the risk of injury to yourself and your kidney.  HOME CARE INSTRUCTIONS   Drink enough water and fluids to keep your urine clear or pale yellow.   Strain all urine through the provided strainer. Keep all particulate matter and stones for your caregiver to see. The stone causing the pain may be as small as a grain of salt. It is very important to use the strainer each and every time you pass your  urine. The collection of your stone will allow your caregiver to analyze it and verify that a stone has actually passed.   Only take over-the-counter or prescription medicines for pain, discomfort, or fever as directed by your caregiver.   Make a follow-up appointment with your caregiver as directed.   Get follow-up X-rays if required. The absence of pain does not always mean that the stone has passed. It may have only stopped moving. If the urine remains completely obstructed, it can cause loss of kidney function or even complete destruction of the kidney. It is your responsibility to make sure X-rays and follow-ups are completed. Ultrasounds of the kidney can show blockages and the status of the kidney. Ultrasounds are not associated with any radiation and can be performed easily in a matter of minutes.  SEEK IMMEDIATE MEDICAL CARE IF:   Pain cannot be controlled with the prescribed medicine.   You have a fever.   The severity or intensity of pain increases over 18 hours and is not relieved by pain medicine.   You develop a new onset of abdominal pain.   You feel faint or pass out.  MAKE SURE YOU:   Understand these instructions.   Will watch your condition.   Will get help right away if you are not doing well or get worse.  Document Released: 04/20/2005 Document Revised: 04/09/2011 Document Reviewed: 08/16/2009 [facility] Patient Information 2012 [place], [place].

## 2011-10-30 NOTE — ED Notes (Signed)
Pt unable to keep CT contrast down after 2 doses of zofran.  edp notified and ordered to proceed with CT.  Pt notified and verbalized understanding.

## 2011-10-30 NOTE — ED Notes (Signed)
Pt vomited x 1 after receiving dilaudid.  meds given.  Pt sipping on ct contrast at this time.  nad noted.

## 2011-10-30 NOTE — ED Notes (Signed)
Pt vomited contrast. EMD aware, Zofran repeated. CT tech aware. Pt is feeling better and will attempt 2nd bottle of contrast. NAD.

## 2011-10-30 NOTE — ED Provider Notes (Signed)
History   This chart was scribed for Glynn Octave, MD by Shari Heritage. The patient was seen in room APA06/APA06. Patient's care was started at 1051.    CSN: [id number]  Arrival date & time 10/30/11  1051   First MD Initiated Contact with Patient 10/30/11 1137      Chief Complaint  Patient presents with  . Abdominal Pain    (Consider location/radiation/quality/duration/timing/severity/associated sxs/prior treatment) Patient is a 56 y.o. female presenting with abdominal pain. The history is provided by the patient. No language interpreter was used.  Abdominal Pain The primary symptoms of the illness include abdominal pain. The current episode started 1 to 2 hours ago. The onset of the illness was sudden. The problem has been gradually worsening.  The patient states that she believes she is currently not pregnant. Symptoms associated with the illness do not include hematuria. Significant associated medical issues include liver disease. Significant associated medical issues do not include diabetes.   Ariana Long is a 56 y.o. female who presents to the Emergency Department complaining of sudden onset lower abdominal pain radiated to left side onset a few hours ago with associated nausea. Patient says that she has had a similar pain several years ago. Patient says she was seen at that time at [facility], but pain had subsided by the time she was seen by the physician who ultimately thought patient may have had kidney stones. Patient denies vomiting, fever, dysuria, hematuria. Patient with h/o hidradenitis, thrombocytopenia, hepatitis C, cirrhosis of the liver, and splenomegaly. Patient with surgical h/o C-section x2, axillary gland removal, and colonoscopy. Patient has never smoked.  PCP -  Past Medical History  Diagnosis Date  . Osteoporosis   . Hidradenitis   . Thrombocytopenia 01/26/2011  . Hepatitis C 01/26/2011    2005 non responder, genotype 1, Gr 1-2, Stage 3   . Splenomegaly  01/26/2011  . Cirrhosis of liver 01/26/2011    [facility] [place]-nonresponder-HCC surveillance  . Elevated AFP 01/26/2011    Past Surgical History  Procedure Date  . Cesarean section     x 2  . Axillary gland removal   . Colonoscopy 07/31/2005    Rourk-Normal rectum/Normal colonoscopy/Repeat screening colonoscopy ten years    Family History  Problem Relation Age of Onset  . Heart attack Mother   . Stroke Mother   . Aneurysm Father     History  Substance Use Topics  . Smoking status: Never Smoker   . Smokeless tobacco: Never Used  . Alcohol Use: No    OB History    Grav Para Term Preterm Abortions TAB SAB Ect Mult Living                  Review of Systems  Gastrointestinal: Positive for abdominal pain.  Genitourinary: Negative for hematuria.   A complete 10 system review of systems was obtained and all systems are negative except as noted in the HPI and PMH.   Allergies  Review of patient's allergies indicates no known allergies.  Home Medications   Current Outpatient Rx  Name Route Sig Dispense Refill  . CALCIUM CARBONATE 600 MG PO TABS Oral Take 1,200-1,800 mg by mouth daily.     Marland Kitchen VITAMIN D-3 PO Oral Take 4,000 Units by mouth daily.    Marland Kitchen MAGNESIUM OXIDE 400 MG PO TABS Oral Take 400 mg by mouth 2 (two) times daily.     Marland Kitchen MILK THISTLE 175 MG PO TABS Oral Take 175 mg by mouth daily.      Marland Kitchen  MILK THISTLE 250 MG PO CAPS Oral Take 1 capsule by mouth daily.    Marland Kitchen PEG-KCL-NACL-NASULF-NA ASC-C 100 G PO SOLR Oral Take 1 kit by mouth once. As directed Please purchase 1 Fleets enema to use with the prep    . VITAMIN E 400 UNITS PO CAPS Oral Take 400 Units by mouth daily.    Marland Kitchen CIPROFLOXACIN HCL 500 MG PO TABS Oral Take 1 tablet (500 mg total) by mouth 2 (two) times daily. 20 tablet 0  . IBUPROFEN 800 MG PO TABS Oral Take 1 tablet (800 mg total) by mouth 3 (three) times daily. 21 tablet 0  . ONDANSETRON HCL 4 MG PO TABS Oral Take 1 tablet (4 mg total) by mouth every 6  (six) hours. 12 tablet 0  . OXYCODONE-ACETAMINOPHEN 5-325 MG PO TABS Oral Take 2 tablets by mouth every 4 (four) hours as needed for pain. 15 tablet 0    BP 106/71  Pulse 61  Temp 97.4 F (36.3 C) (Oral)  Resp 16  Ht 5\' 6"  (1.676 m)  Wt 158 lb (71.668 kg)  BMI 25.50 kg/m2  SpO2 95%  Physical Exam  Nursing note and vitals reviewed. Constitutional: She is oriented to person, place, and time. She appears well-developed and well-nourished.  HENT:  Head: Normocephalic and atraumatic.  Eyes: Conjunctivae and EOM are normal. Pupils are equal, round, and reactive to light.  Neck: Normal range of motion. Neck supple.  Cardiovascular: Normal rate and regular rhythm.   Pulmonary/Chest: Effort normal and breath sounds normal.  Abdominal: Soft. Bowel sounds are normal. She exhibits no distension. There is tenderness (LLQ and suprapubic.). There is no rebound and no guarding.  Musculoskeletal: Normal range of motion. She exhibits no tenderness (No CVA tenderness).  Neurological: She is alert and oriented to person, place, and time.  Skin: Skin is warm and dry.  Psychiatric: She has a normal mood and affect.    ED Course  Procedures (including critical care time) DIAGNOSTIC STUDIES: Oxygen Saturation is 95% on room air, adequate by my interpretation.    COORDINATION OF CARE: 11:54AM- Patient informed of current plan for treatment and evaluation and agrees with plan at this time. Will administer pain medication.  Results for orders placed during the hospital encounter of 10/30/11  URINALYSIS, ROUTINE W REFLEX MICROSCOPIC      Component Value Range   Color, Urine YELLOW  YELLOW   APPearance CLOUDY (*) CLEAR   Specific Gravity, Urine 1.030  1.005 - 1.030   pH 5.5  5.0 - 8.0   Glucose, UA NEGATIVE  NEGATIVE mg/dL   Hgb urine dipstick TRACE (*) NEGATIVE   Bilirubin Urine NEGATIVE  NEGATIVE   Ketones, ur NEGATIVE  NEGATIVE mg/dL   Protein, ur NEGATIVE  NEGATIVE mg/dL   Urobilinogen, UA  0.2  0.0 - 1.0 mg/dL   Nitrite NEGATIVE  NEGATIVE   Leukocytes, UA NEGATIVE  NEGATIVE  PREGNANCY, URINE      Component Value Range   Preg Test, Ur NEGATIVE  NEGATIVE  CBC WITH DIFFERENTIAL      Component Value Range   WBC 5.1  4.0 - 10.5 K/uL   RBC 4.41  3.87 - 5.11 MIL/uL   Hemoglobin 14.0  12.0 - 15.0 g/dL   HCT 47.8  29.5 - 62.1 %   MCV 91.4  78.0 - 100.0 fL   MCH 31.7  26.0 - 34.0 pg   MCHC 34.7  30.0 - 36.0 g/dL   RDW 30.8  65.7 - 84.6 %  Platelets 116 (*) 150 - 400 K/uL   Neutrophils Relative 42 (*) 43 - 77 %   Neutro Abs 2.1  1.7 - 7.7 K/uL   Lymphocytes Relative 47 (*) 12 - 46 %   Lymphs Abs 2.4  0.7 - 4.0 K/uL   Monocytes Relative 9  3 - 12 %   Monocytes Absolute 0.5  0.1 - 1.0 K/uL   Eosinophils Relative 2  0 - 5 %   Eosinophils Absolute 0.1  0.0 - 0.7 K/uL   Basophils Relative 1  0 - 1 %   Basophils Absolute 0.0  0.0 - 0.1 K/uL  COMPREHENSIVE METABOLIC PANEL      Component Value Range   Sodium 138  135 - 145 mEq/L   Potassium 3.4 (*) 3.5 - 5.1 mEq/L   Chloride 101  96 - 112 mEq/L   CO2 23  19 - 32 mEq/L   Glucose, Bld 123 (*) 70 - 99 mg/dL   BUN 17  6 - 23 mg/dL   Creatinine, Ser 1.61  0.50 - 1.10 mg/dL   Calcium 9.7  8.4 - 09.6 mg/dL   Total Protein 8.4 (*) 6.0 - 8.3 g/dL   Albumin 4.1  3.5 - 5.2 g/dL   AST 74 (*) 0 - 37 U/L   ALT 87 (*) 0 - 35 U/L   Alkaline Phosphatase 104  39 - 117 U/L   Total Bilirubin 0.6  0.3 - 1.2 mg/dL   GFR calc non Af Amer 73 (*) >90 mL/min   GFR calc Af Amer 85 (*) >90 mL/min  URINE MICROSCOPIC-ADD ON      Component Value Range   RBC / HPF 3-6  <3 RBC/hpf   Bacteria, UA MANY (*) RARE  LACTIC ACID, PLASMA      Component Value Range   Lactic Acid, Venous 1.2  0.5 - 2.2 mmol/L   Ct Abdomen Pelvis W Contrast  10/30/2011  *RADIOLOGY REPORT*  Clinical Data: Left lower quadrant pain  CT ABDOMEN AND PELVIS WITH CONTRAST  Technique:  Multidetector CT imaging of the abdomen and pelvis was performed following the standard protocol  during bolus administration of intravenous contrast.  Contrast: OMNIPAQUE IOHEXOL 300 MG/ML  SOLN  Comparison: 06/18/2004  Findings: There is mild left hydronephrosis.  There is extensive left perinephric stranding.  The left nephrographic phase is delayed.  There is no excretion of contrast on the delayed images from the left kidney.  Tiny calculi are present in the right kidney.  No left nephrolithiasis.  2 mm calculus is in the left ureteral vesicle junction.  Liver, gallbladder, spleen, pancreas, adrenal glands are within normal limits.  Normal appendix.  Bowel is nondistended.  Bladder is otherwise within normal limits.  Uterus and adnexa are unremarkable.  Prominent lumbar degenerative disc disease.  IMPRESSION: 2 mm left ureteral vesicle junction calculus associated with secondary findings of left ureteral obstruction.  An inflammatory process in the left kidney is not excluded.  Right nephrolithiasis.  Original Report Authenticated By: Donavan Burnet, M.D.     1. Nephrolithiasis   2. Urinary tract infection       MDM  LLQ pain radiating to left flank that has been constant since this morning.  Multiple loose stools this AM.  No fever or vomiting.  Labs, UA, CT to evaluate for diverticulitis.  2mm obstructing stone at L UVJ.  UA appears negative but many bacteria on microscopic, no comment on squamous cells. Patient feels improved.  D/w Dr.  Javaid. Small obstructing stone. With questionable UA. Will give antibitoics. Recommends followup in office on Monday.     I personally performed the services described in this documentation, which was scribed in my presence.  The recorded information has been reviewed and considered.   Glynn Octave, MD 10/30/11 [phone]

## 2011-11-02 DIAGNOSIS — D126 Benign neoplasm of colon, unspecified: Secondary | ICD-10-CM

## 2011-11-02 HISTORY — DX: Benign neoplasm of colon, unspecified: D12.6

## 2011-11-16 ENCOUNTER — Ambulatory Visit (HOSPITAL_COMMUNITY)
Admission: RE | Admit: 2011-11-16 | Discharge: 2011-11-16 | Disposition: A | Discharge status: Home / Self Care | Payer: 59 | Source: Ambulatory Visit | Attending: Internal Medicine | Admitting: Internal Medicine

## 2011-11-16 ENCOUNTER — Encounter (HOSPITAL_COMMUNITY)
Admission: RE | Disposition: A | Discharge status: Home / Self Care | Payer: Self-pay | Source: Ambulatory Visit | Attending: Internal Medicine

## 2011-11-16 ENCOUNTER — Encounter (HOSPITAL_COMMUNITY): Payer: Self-pay | Admitting: *Deleted

## 2011-11-16 DIAGNOSIS — K921 Melena: Secondary | ICD-10-CM

## 2011-11-16 DIAGNOSIS — K5900 Constipation, unspecified: Secondary | ICD-10-CM

## 2011-11-16 DIAGNOSIS — D126 Benign neoplasm of colon, unspecified: Secondary | ICD-10-CM

## 2011-11-16 DIAGNOSIS — K6289 Other specified diseases of anus and rectum: Secondary | ICD-10-CM | POA: Insufficient documentation

## 2011-11-16 HISTORY — PX: COLONOSCOPY: SHX5424

## 2011-11-16 SURGERY — COLONOSCOPY
Anesthesia: Moderate Sedation

## 2011-11-16 MED ORDER — SODIUM CHLORIDE 0.45 % IV SOLN
Freq: Once | INTRAVENOUS | Status: AC
  Administered 2011-11-16: 1000 mL via INTRAVENOUS

## 2011-11-16 MED ORDER — STERILE WATER FOR IRRIGATION IR SOLN
Status: DC | PRN
  Administered 2011-11-16: 09:00:00

## 2011-11-16 MED ORDER — MIDAZOLAM HCL 5 MG/5ML IJ SOLN
INTRAMUSCULAR | Status: DC | PRN
  Administered 2011-11-16 (×3): 2 mg via INTRAVENOUS
  Administered 2011-11-16: 1 mg via INTRAVENOUS

## 2011-11-16 MED ORDER — SPOT INK MARKER SYRINGE KIT
PACK | SUBMUCOSAL | Status: DC | PRN
  Administered 2011-11-16: 2 mL via SUBMUCOSAL

## 2011-11-16 MED ORDER — MEPERIDINE HCL 100 MG/ML IJ SOLN
INTRAMUSCULAR | Status: AC
  Filled 2011-11-16: qty 2

## 2011-11-16 MED ORDER — MEPERIDINE HCL 100 MG/ML IJ SOLN
INTRAMUSCULAR | Status: DC | PRN
  Administered 2011-11-16: 50 mg via INTRAVENOUS
  Administered 2011-11-16 (×2): 25 mg via INTRAVENOUS

## 2011-11-16 MED ORDER — MIDAZOLAM HCL 5 MG/5ML IJ SOLN
INTRAMUSCULAR | Status: AC
  Filled 2011-11-16: qty 10

## 2011-11-16 NOTE — H&P (View-Only) (Signed)
Referring Provider: Golding, John Cabot, MD Primary Care Physician:  GOLDING,JOHN CABOT, MD Primary Gastroenterologist:  Dr. Rourk  Chief Complaint  Patient presents with  . Follow-up    reschedule colonoscopy    HPI:  Ariana Long is a 56 y.o. female here to reschedule colonoscopy.  She was seen by me w/ hematochezia May 2013.  Colonoscopy was postponed due to her husband having an MI & stent placement.  She is here to reschedule.  No major changes since last office visit.  She still occasionally sees scant bright red bleeding especially w/ hard BMs.  Completed rectiv & proctofoam for anal fissure & this helped tremendously.  Overall, she feels much better.   Denies abdominal pain, nausea, vomiting, anorexia, or weight loss. Past Medical History  Diagnosis Date  . Osteoporosis   . Hidradenitis   . Thrombocytopenia 01/26/2011  . Hepatitis C 01/26/2011    2005 non responder, genotype 1, Gr 1-2, Stage 3   . Splenomegaly 01/26/2011  . Cirrhosis of liver 01/26/2011    [facility] [place]-nonresponder-HCC surveillance  . Elevated AFP 01/26/2011    Past Surgical History  Procedure Date  . Cesarean section     x 2  . Axillary gland removal   . Colonoscopy 07/31/2005    Rourk-Normal rectum/Normal colonoscopy/Repeat screening colonoscopy ten years    Current Outpatient Prescriptions  Medication Sig Dispense Refill  . calcium carbonate (OS-CAL) 600 MG TABS Take 1,200-1,800 mg by mouth daily.       . Cholecalciferol (VITAMIN D-3 PO) Take 4,000 Units by mouth daily.      . magnesium oxide (MAG-OX) 400 MG tablet Take 400 mg by mouth 2 (two) times daily.       . milk thistle 175 MG tablet Take 175 mg by mouth daily.        . Milk Thistle 250 MG CAPS Take 1 capsule by mouth daily.      . vitamin E 400 UNIT capsule Take 400 Units by mouth daily.      . peg 3350 powder (MOVIPREP) 100 G SOLR Take 1 kit (100 g total) by mouth once. As directed Please purchase 1 Fleets enema to use with the  prep  1 kit  0    Allergies as of 10/22/2011  . (No Known Allergies)    Family History:There is no known family history of colorectal carcinoma , liver disease, or inflammatory bowel disease.  Problem Relation Age of Onset  . Heart attack Mother   . Stroke Mother   . Aneurysm Father     History   Social History  . Marital Status: Married    Spouse Name: N/A    Number of Children: 2  . Years of Education: N/A   Occupational History  . Machine [employer]   Social History Main Topics  . Smoking status: Never Smoker   . Smokeless tobacco: Never Used  . Alcohol Use: No  . Drug Use: No  . Sexually Active: Not on file  Review of Systems: Gen: Denies any fever, chills, sweats, anorexia, fatigue, weakness, malaise, weight loss, and sleep disorder CV: Denies chest pain, angina, palpitations, syncope, orthopnea, PND, peripheral edema, and claudication. Resp: Denies dyspnea at rest, dyspnea with exercise, cough, sputum, wheezing, coughing up blood, and pleurisy. GI: Denies vomiting blood, jaundice, and fecal incontinence.   Denies dysphagia or odynophagia. GU : Denies urinary burning, blood in urine, urinary frequency, urinary hesitancy, nocturnal urination, and urinary incontinence. MS: Denies joint pain, limitation   of movement, and swelling, stiffness, low back pain, extremity pain. Denies muscle weakness, cramps, atrophy.  Derm: Denies rash, itching, dry skin, hives, moles, warts, or unhealing ulcers.  Psych: Denies depression, anxiety, memory loss, suicidal ideation, hallucinations, paranoia, and confusion. Heme: Denies bruising, bleeding, and enlarged lymph nodes. Neuro:  Denies any headaches, dizziness, paresthesias. Endo:  Denies any problems with DM, thyroid, adrenal function.  Physical Exam: BP 112/72  Pulse 71  Temp 98.3 F (36.8 C) (Temporal)  Ht 5' 6" (1.676 m)  Wt 158 lb 12.8 oz (72.031 kg)  BMI 25.63 kg/m2 General:   Alert,  Well-developed,  well-nourished, pleasant and cooperative in NAD Head:  Normocephalic and atraumatic. Eyes:  Sclera clear, no icterus.   Conjunctiva pink. Ears:  Normal auditory acuity. Nose:  No deformity, discharge, or lesions. Mouth:  No deformity or lesions,oropharynx pink & moist. Neck:  Supple; no masses or thyromegaly. Lungs:  Clear throughout to auscultation.   No wheezes, crackles, or rhonchi. No acute distress. Heart:  Regular rate and rhythm; no murmurs, clicks, rubs,  or gallops. Abdomen:  Normal bowel sounds.  No bruits.  Soft, non-tender and non-distended without masses, hepatosplenomegaly or hernias noted.  No guarding or rebound tenderness.   Rectal:  Deferred. Msk:  Symmetrical without gross deformities. Normal posture. Pulses:  Normal pulses noted. Extremities:  No clubbing or edema. Neurologic:  Alert and oriented x4;  grossly normal neurologically. Skin:  Intact without significant lesions or rashes. Lymph Nodes:  No significant cervical adenopathy. Psych:  Alert and cooperative. Normal mood and affect. 

## 2011-11-16 NOTE — Interval H&P Note (Signed)
History and Physical Interval Note:  11/16/2011 9:03 AM  Ariana Long  has presented today for surgery, with the diagnosis of hematochezia  The various methods of treatment have been discussed with the patient and family. After consideration of risks, benefits and other options for treatment, the patient has consented to  Procedure(s) (LRB): COLONOSCOPY (N/A) as a surgical intervention .  The patient's history has been reviewed, patient examined, no change in status, stable for surgery.  I have reviewed the patients' chart and labs.  Questions were answered to the patient's satisfaction.     [name]

## 2011-11-16 NOTE — Op Note (Signed)
[facility] [address] [place], [place]  [address]  COLONOSCOPY PROCEDURE REPORT  PATIENT:  Ariana Long, Ariana Long  MR#:  [id number] BIRTHDATE:  04/17/1956, 56 yrs. old  GENDER:  female ENDOSCOPIST:  R. Roetta Sessions, MD FACP [facility] REF. BY:  Julaine Fusi, D.O. Assunta Found, M.D. PROCEDURE DATE:  11/16/2011 PROCEDURE:  Colonoscopy with snare polypectomy  INDICATIONS:  Hematochezia in the setting of constipation.  INFORMED CONSENT:  The risks, benefits, alternatives and imponderables including but not limited to bleeding, perforation as well as the possibility of a missed lesion have been reviewed. The potential for biopsy, lesion removal, etc. have also been discussed.  Questions have been answered.  All parties agreeable. Please see the history and physical in the medical record for more information.  MEDICATIONS:  Versed 7 mg IV and Demerol 100 mg IV in divided doses.  DESCRIPTION OF PROCEDURE:  After a digital rectal exam was performed, the EC-3890Li ([id number]) colonoscope was advanced from the anus through the rectum and colon to the area of the cecum, ileocecal valve and appendiceal orifice.  The cecum was deeply intubated.  These structures were well-seen and photographed for the record.  From the level of the cecum and ileocecal valve, the scope was slowly and cautiously withdrawn.  The mucosal surfaces were carefully surveyed utilizing scope tip deflection to facilitate fold flattening as needed.  The scope was pulled down into the rectum where a thorough examination including retroflexion was performed. <<PROCEDUREIMAGES>>  FINDINGS: Good preparation. Friable anorectum. Hyperplastic appearing polyp straddling a fold in the ascending colon approximately 7 cm distal to the ileocecal valve. Please see image 6. ( approximately 1.2 cm x 2.5 cm). The remainder of colonic mucosa appeared normal aside from a single 6 bulbar polyp in the mid D.;.  THERAPEUTIC /  DIAGNOSTIC MANEUVERS PERFORMED:  The polyp in the ascending colon was resected with snare cautery. It is possible that some of this polyp was left behind. There was a large polypectomy site left behind. This area was tattooed. It was difficult to see behind the fold. The polyp in the descending site was hot snare removed.  COMPLICATIONS:  None  CECAL WITHDRAWAL TIME:    37 minutes  IMPRESSION:  Friable anorectum-likely source of hematochezia. Colonic polyps removed as described above  RECOMMENDATIONS: Followup on pathology.. Daily fiber supplement Benefiber 2 tablespoons. Colace 100 mg orally twice daily. MiraLax nightly as needed for bowel function.  ______________________________ R. Roetta Sessions, MD Caleen Essex  CC:  Julaine Fusi, D.O. Assunta Found, M.D.  n. eSIGNED:   R. Roetta Sessions at 11/16/2011 10:16 AM  Waynette Buttery, [phone]

## 2011-11-17 ENCOUNTER — Encounter: Payer: Self-pay | Admitting: *Deleted

## 2011-11-17 ENCOUNTER — Encounter: Payer: Self-pay | Admitting: Internal Medicine

## 2011-11-19 NOTE — Progress Notes (Signed)
Letter and path sent to PCP, Dr Phillips Odor, letter sent to Patient

## 2011-11-19 NOTE — Progress Notes (Signed)
Reminder in epic to have tcs in 6 months

## 2011-11-19 NOTE — Progress Notes (Unsigned)
Letter mailed to pt. Dawn, please cc letter and path to pcp. Darl Pikes, please nic tcs 6 months.      Per RMR- Send letter to patient.  Send copy of letter with path to referring provider and PCP.

## 2011-11-24 ENCOUNTER — Encounter (HOSPITAL_COMMUNITY): Payer: Self-pay | Admitting: Internal Medicine

## 2012-01-22 ENCOUNTER — Encounter (HOSPITAL_COMMUNITY): Payer: Self-pay | Admitting: Oncology

## 2012-01-22 ENCOUNTER — Encounter (HOSPITAL_COMMUNITY): Payer: 59 | Attending: Oncology | Admitting: Oncology

## 2012-01-22 VITALS — BP 102/66 | HR 61 | Temp 98.4°F | Resp 16 | Wt 159.2 lb

## 2012-01-22 DIAGNOSIS — D696 Thrombocytopenia, unspecified: Secondary | ICD-10-CM

## 2012-01-22 DIAGNOSIS — K7460 Unspecified cirrhosis of liver: Secondary | ICD-10-CM

## 2012-01-22 DIAGNOSIS — R161 Splenomegaly, not elsewhere classified: Secondary | ICD-10-CM

## 2012-01-22 NOTE — Progress Notes (Signed)
Ariana Ribas, MD [address] [place] [address]  1. Thrombocytopenia   2. Splenomegaly   3. Cirrhosis of liver     CURRENT THERAPY: Observation with labs  INTERVAL HISTORY: Ariana Long 56 y.o. female returns for  regular  visit for followup of thrombocytopenia secondary to liver cirrhosis secondary to chronic hepatitis C and splenomegaly.  Ariana Long underwent a colonoscopy by Dr. Jena Gauss on 11/16/2011 and it revealed a friable ano-rectal region  And removal of colon polyps which was negative for hyperplasia and malignancy.    I personally reviewed and went over laboratory results with the patient.  Her platelet count remains very stable at 116,000.    She is following up with GI and Dr. Brooke Dare as directed.  So we will perform lab work every 6 months and see her back in 1 year.    Hematologic ROS questioning is negative.  She does have occasional BRBPR which is from her internal hemorrhoids.   Past Medical History  Diagnosis Date  . Osteoporosis   . Hidradenitis   . Thrombocytopenia 01/26/2011  . Hepatitis C 01/26/2011    2005 non responder, genotype 1, Gr 1-2, Stage 3   . Splenomegaly 01/26/2011  . Cirrhosis of liver 01/26/2011    [facility] [place]-nonresponder-HCC surveillance  . Elevated AFP 01/26/2011  . Kidney stones 10/2011  . Hx: UTI (urinary tract infection)   . Colon polyps   . Hemorrhoids     has Thrombocytopenia; Hepatitis C; Splenomegaly; Cirrhosis of liver; Elevated AFP; Anal fissure; and Hematochezia on her problem list.      has no known allergies.  Ariana Long had no medications administered during this visit.  Past Surgical History  Procedure Date  . Cesarean section     x 2  . Axillary gland removal   . Colonoscopy 07/31/2005    Rourk-Normal rectum/Normal colonoscopy/Repeat screening colonoscopy ten years  . Breast biopsy 2011    Right  . Colonoscopy 11/16/2011    Procedure: COLONOSCOPY;  Surgeon: Corbin Ade, MD;  Location: [facility];  Service: Endoscopy;  Laterality: N/A;  8:30    Denies any headaches, dizziness, double vision, fevers, chills, night sweats, nausea, vomiting, diarrhea, constipation, chest pain, heart palpitations, shortness of breath, black tarry stool, urinary pain, urinary burning, urinary frequency, hematuria.   PHYSICAL EXAMINATION  ECOG PERFORMANCE STATUS: 0 - Asymptomatic  Filed Vitals:   01/22/12 1046  BP: 102/66  Pulse: 61  Temp: 98.4 F (36.9 C)  Resp: 16    GENERAL:alert, no distress, well nourished, well developed, comfortable, cooperative, obese and smiling SKIN: skin color, texture, turgor are normal, no rashes or significant lesions HEAD: Normocephalic, No masses, lesions, tenderness or abnormalities EYES: normal, Conjunctiva are pink and non-injected EARS: External ears normal OROPHARYNX:lips, buccal mucosa, and tongue normal and mucous membranes are moist  NECK: supple, no adenopathy, thyroid normal size, non-tender, without nodularity, no stridor, non-tender, trachea midline LYMPH:  no palpable lymphadenopathy, no hepatosplenomegaly BREAST:not examined LUNGS: clear to auscultation and percussion HEART: regular rate & rhythm, no murmurs, no gallops, S1 normal and S2 normal ABDOMEN:abdomen soft, non-tender, obese, normal bowel sounds and no masses or organomegaly BACK: Back symmetric, no curvature., No CVA tenderness EXTREMITIES:less then 2 second capillary refill, no joint deformities, effusion, or inflammation, no edema, no skin discoloration, no clubbing, no cyanosis  NEURO: alert & oriented x 3 with fluent speech, no focal motor/sensory deficits, gait normal    LABORATORY DATA: CBC  Component Value Date/Time   WBC 5.1 10/30/2011 1115   RBC 4.41 10/30/2011 1115   HGB 14.0 10/30/2011 1115   HCT 40.3 10/30/2011 1115   PLT 116* 10/30/2011 1115   MCV 91.4 10/30/2011 1115   MCH 31.7 10/30/2011 1115   MCHC 34.7 10/30/2011 1115   RDW 13.0  10/30/2011 1115   LYMPHSABS 2.4 10/30/2011 1115   MONOABS 0.5 10/30/2011 1115   EOSABS 0.1 10/30/2011 1115   BASOSABS 0.0 10/30/2011 1115      Chemistry      Component Value Date/Time   NA 138 10/30/2011 1115   K 3.4* 10/30/2011 1115   CL 101 10/30/2011 1115   CO2 23 10/30/2011 1115   BUN 17 10/30/2011 1115   CREATININE 0.86 10/30/2011 1115      Component Value Date/Time   CALCIUM 9.7 10/30/2011 1115   ALKPHOS 104 10/30/2011 1115   AST 74* 10/30/2011 1115   ALT 87* 10/30/2011 1115   BILITOT 0.6 10/30/2011 1115       ASSESSMENT:  1. Cirrhosis of liver  2. Splenomegaly  3. Thrombocytopenia secondary to 1 and 2  4. Chronic Hepatits C, followed by Dr. Brooke Dare   PLAN:  1. I personally reviewed and went over laboratory results with the patient. 2. Follow-up with Dr. Brooke Dare as scheduled.  3. Lab work every 6 months: CBC diff 4. Will gladly consolidate labs with other appointments if needed or cancel or lab appointments if CBC done recently.  5. Return in 1 year for follow-up.   All questions were answered. The patient knows to call the clinic with any problems, questions or concerns. We can certainly see the patient much sooner if necessary.  The patient and plan discussed with Glenford Peers, MD and he is in agreement with the aforementioned.  KEFALAS,THOMAS

## 2012-01-22 NOTE — Patient Instructions (Addendum)
[facility]  Discharge Instructions  RECOMMENDATIONS MADE BY THE CONSULTANT AND ANY TEST RESULTS WILL BE SENT TO YOUR REFERRING DOCTOR.   EXAM FINDINGS BY MD TODAY AND SIGNS AND SYMPTOMS TO REPORT TO CLINIC OR PRIMARY MD: Discussion per PA.  Will check labs every 6 months and will see you back in 1 year.  MEDICATIONS PRESCRIBED: none   INSTRUCTIONS GIVEN AND DISCUSSED: Other: Report unusual bruising or bleeding.  SPECIAL INSTRUCTIONS/FOLLOW-UP: Lab work Needed every 6 months and Return to Clinic in 1 year for follow-up.   I acknowledge that I have been informed and understand all the instructions given to me and received a copy. I do not have any more questions at this time, but understand that I may call the Specialty Clinic at [facility] at [phone] during business hours should I have any further questions or need assistance in obtaining follow-up care.    __________________________________________  _____________  __________ Signature of Patient or Authorized Representative            Date                   Time    __________________________________________ Nurse's Signature

## 2012-02-25 LAB — BASIC METABOLIC PANEL
ALT: 70 U/L — AB (ref 7–35)
Alkaline Phosphatase: 86 U/L
BUN: 13 mg/dL (ref 4–21)
CO2: 26 mmol/L
Glucose: 76
Total Bilirubin: 0.5 mg/dL

## 2012-02-25 LAB — ANEMIA PANEL
HCT: 42 %
Hemoglobin: 15.2 g/dL (ref 12.0–16.0)
Sed Rate: 4 mm

## 2012-02-25 LAB — CBC: WBC: 4.3

## 2012-02-29 ENCOUNTER — Other Ambulatory Visit (HOSPITAL_COMMUNITY): Payer: Self-pay | Admitting: Family Medicine

## 2012-02-29 DIAGNOSIS — Z139 Encounter for screening, unspecified: Secondary | ICD-10-CM

## 2012-03-04 ENCOUNTER — Ambulatory Visit (HOSPITAL_COMMUNITY): Payer: 59

## 2012-03-08 ENCOUNTER — Ambulatory Visit (HOSPITAL_COMMUNITY)
Admission: RE | Admit: 2012-03-08 | Discharge: 2012-03-08 | Disposition: A | Discharge status: Home / Self Care | Payer: 59 | Source: Ambulatory Visit | Attending: Family Medicine | Admitting: Family Medicine

## 2012-03-08 DIAGNOSIS — Z1231 Encounter for screening mammogram for malignant neoplasm of breast: Secondary | ICD-10-CM | POA: Insufficient documentation

## 2012-03-08 DIAGNOSIS — Z139 Encounter for screening, unspecified: Secondary | ICD-10-CM

## 2012-04-19 ENCOUNTER — Encounter: Payer: Self-pay | Admitting: *Deleted

## 2012-04-29 ENCOUNTER — Encounter (HOSPITAL_COMMUNITY): Payer: 59 | Attending: Oncology

## 2012-04-29 DIAGNOSIS — D696 Thrombocytopenia, unspecified: Secondary | ICD-10-CM | POA: Insufficient documentation

## 2012-04-29 DIAGNOSIS — R161 Splenomegaly, not elsewhere classified: Secondary | ICD-10-CM

## 2012-04-29 DIAGNOSIS — K7460 Unspecified cirrhosis of liver: Secondary | ICD-10-CM | POA: Insufficient documentation

## 2012-04-29 LAB — CBC WITH DIFFERENTIAL/PLATELET
Basophils Absolute: 0.1 10*3/uL (ref 0.0–0.1)
Eosinophils Absolute: 0.1 10*3/uL (ref 0.0–0.7)
Eosinophils Relative: 3 % (ref 0–5)
HCT: 41.7 % (ref 36.0–46.0)
Lymphocytes Relative: 53 % — ABNORMAL HIGH (ref 12–46)
MCH: 32.9 pg (ref 26.0–34.0)
MCV: 93.9 fL (ref 78.0–100.0)
Monocytes Absolute: 0.3 10*3/uL (ref 0.1–1.0)
Platelets: 125 10*3/uL — ABNORMAL LOW (ref 150–400)
RDW: 13.2 % (ref 11.5–15.5)
WBC: 4 10*3/uL (ref 4.0–10.5)

## 2012-04-29 NOTE — Progress Notes (Signed)
Ariana Long presented for labwork. Labs per MD order drawn via Peripheral Line 23 gauge needle inserted in left AC  Good blood return present. Procedure without incident.  Needle removed intact. Patient tolerated procedure well.  Has bruise right upper arm approximately 2 inches in diameter.  Hit arm on door @ church.  Plans to have dental work on Monday and wanted to make sure platelet count was ok.

## 2012-05-16 ENCOUNTER — Other Ambulatory Visit: Payer: Self-pay | Admitting: Family Medicine

## 2012-05-16 DIAGNOSIS — R928 Other abnormal and inconclusive findings on diagnostic imaging of breast: Secondary | ICD-10-CM

## 2012-05-17 ENCOUNTER — Ambulatory Visit: Payer: 59 | Admitting: Urgent Care

## 2012-05-17 ENCOUNTER — Encounter: Payer: Self-pay | Admitting: Internal Medicine

## 2012-05-18 ENCOUNTER — Ambulatory Visit (HOSPITAL_COMMUNITY)
Admission: RE | Admit: 2012-05-18 | Discharge: 2012-05-18 | Disposition: A | Discharge status: Home / Self Care | Payer: 59 | Source: Ambulatory Visit | Attending: Family Medicine | Admitting: Family Medicine

## 2012-05-18 ENCOUNTER — Ambulatory Visit: Payer: 59 | Admitting: Gastroenterology

## 2012-05-18 DIAGNOSIS — R928 Other abnormal and inconclusive findings on diagnostic imaging of breast: Secondary | ICD-10-CM

## 2012-06-07 ENCOUNTER — Ambulatory Visit (INDEPENDENT_AMBULATORY_CARE_PROVIDER_SITE_OTHER): Payer: 59 | Admitting: Urgent Care

## 2012-06-07 ENCOUNTER — Encounter: Payer: Self-pay | Admitting: Urgent Care

## 2012-06-07 VITALS — BP 115/79 | HR 74 | Temp 98.4°F | Ht 65.0 in | Wt 164.8 lb

## 2012-06-07 DIAGNOSIS — D126 Benign neoplasm of colon, unspecified: Secondary | ICD-10-CM

## 2012-06-07 DIAGNOSIS — B1920 Unspecified viral hepatitis C without hepatic coma: Secondary | ICD-10-CM

## 2012-06-07 DIAGNOSIS — K5900 Constipation, unspecified: Secondary | ICD-10-CM

## 2012-06-07 DIAGNOSIS — R799 Abnormal finding of blood chemistry, unspecified: Secondary | ICD-10-CM

## 2012-06-07 DIAGNOSIS — R772 Abnormality of alphafetoprotein: Secondary | ICD-10-CM

## 2012-06-07 MED ORDER — PEG 3350-KCL-NA BICARB-NACL 420 G PO SOLR: 4000.0000 mL | ORAL | Status: DC

## 2012-06-07 MED ORDER — LIDOCAINE-HYDROCORTISONE ACE 3-1 % RE KIT: 1.0000 "application " | PACK | Freq: Two times a day (BID) | RECTAL | Status: DC

## 2012-06-07 NOTE — Assessment & Plan Note (Signed)
See HCV

## 2012-06-07 NOTE — Assessment & Plan Note (Addendum)
Ariana Long is a pleasant 57 y.o. female with chronic constipation & friable anorectum with pruritis & hemorrhoids.  She is doing well with miralax PRN.  Anamantle HC forte twice daily to your rectum Titrate your miralax 17 grams as needed Colonoscopy with Dr Jena Gauss

## 2012-06-07 NOTE — Progress Notes (Signed)
Referring Provider: Corrie Mckusick, MD Primary Care Physician:  [name], MD Primary Gastroenterologist:  Dr. Jena Gauss  Chief Complaint  Patient presents with  . Follow-up    pt doing ok    HPI:  Ariana Long is a 57 y.o. female here for follow-up & to schedule a surveillance colonoscopy. She had a large serrated adenoma 1.2cm x 2.5 cm located 7cm distal to ICV resected on last colonoscopy by Dr Jena Gauss 11/2011 & is due for follow-up.  She also had a friable anorectum.  She has noticed rectal pruritis.  She has had small volume gross hematuria last weekend.  She has hx renal lithiasis & thrombocytopenia followed by Dr Mariel Sleet.  She has been followed by [facility] Clinic with Dr Jacqualine Mau in [place] for chronic hepatitis C, however she admits to not following up last year.  She is planning on keeping management with him, however she reports difficulty scheduling follow-up.  We will fax her information to see if we can help.  She thinks her last annual MRI was in 2012.  She did have a CT as below 7 months ago at [facility].  C/o fatigue, some sinus drainage in AM.  Her constipation is much better w/ miralax 17 grams daily PRN.    CT abdomen/pelvis June 2013 with IV contrast-2 mm left ureteral vesicle junction calculus associated with secondary findings of left ureteral obstruction. An inflammatory process in the left kidney is not excluded. Right nephrolithiasis.    Past Medical History  Diagnosis Date  . Osteoporosis   . Hidradenitis   . Thrombocytopenia 01/26/2011  . Hepatitis C 01/26/2011    2005 non responder, genotype 1, Gr 1-2, Stage 3   . Splenomegaly 01/26/2011  . Cirrhosis of liver 01/26/2011    [facility] [place]-nonresponder-HCC surveillance  . Elevated AFP 01/26/2011  . Kidney stones 10/2011  . Hx: UTI (urinary tract infection)   . Serrated adenoma of colon 11/2011  . Hemorrhoids     Past Surgical History  Procedure Date  . Cesarean section     x 2  . Axillary gland  removal   . Colonoscopy 07/31/2005    Rourk-Normal rectum/Normal colonoscopy/Repeat screening colonoscopy ten years  . Breast biopsy 2011    Right  . Colonoscopy 11/16/2011    RMR: Friable anorectum-likely source of hematochezia/ LARGE 1.2x2.5CM SERRATED ADENOMA resected from ascending colon 7CM DISTAL TO ICV, hyperplastic polyp removed (NEXT COLONOSCOPY  DUE 05/2012)    Current Outpatient Prescriptions  Medication Sig Dispense Refill  . calcium carbonate (OS-CAL) 600 MG TABS Take 1,200-1,800 mg by mouth daily.       . Cholecalciferol (VITAMIN D-3 PO) Take 4,000 Units by mouth daily.      . magnesium oxide (MAG-OX) 400 MG tablet Take 400 mg by mouth 2 (two) times daily.       . Milk Thistle 250 MG CAPS Take 1 capsule by mouth daily.      . polyethylene glycol powder (GLYCOLAX/MIRALAX) powder Take 17 g by mouth daily.      Marland Kitchen triamcinolone cream (KENALOG) 0.5 % as needed. Applies to neck as needed      . vitamin E 400 UNIT capsule Take 400 Units by mouth daily.      Marland Kitchen lidocaine-hydrocortisone (ANAMANTLE) 3-1 % KIT Place 1 application rectally 2 (two) times daily.  20 each  0    Allergies as of 06/07/2012  . (No Known Allergies)    Family History:There is no known family history of  colorectal carcinoma , liver disease, or inflammatory bowel disease.  Problem Relation Age of Onset  . Heart attack Mother   . Stroke Mother   . Aneurysm Father     History   Social History  . Marital Status: Married    Spouse Name: N/A    Number of Children: 2  . Years of Education: N/A   Occupational History  . [employer]   Social History Main Topics  . Smoking status: Never Smoker   . Smokeless tobacco: Never Used  . Alcohol Use: No  . Drug Use: No  . Sexually Active: Not on file  Review of Systems: Gen: see HPI CV: Denies chest pain, angina, palpitations, syncope, orthopnea, PND, peripheral edema, and claudication. Resp: Denies dyspnea at rest, dyspnea with exercise,  cough, sputum, wheezing, coughing up blood, and pleurisy. GI: Denies vomiting blood, jaundice, and fecal incontinence.   Denies dysphagia or odynophagia. GU : Denies urinary burning, urinary frequency, urinary hesitancy, nocturnal urination, and urinary incontinence. MS: Denies joint pain, limitation of movement, and swelling, stiffness, low back pain, extremity pain. Denies muscle weakness, cramps, atrophy.  Derm: Denies rash, dry skin, hives, moles, warts, or unhealing ulcers.  Psych: Denies depression, anxiety, memory loss, suicidal ideation, hallucinations, paranoia, and confusion. Heme: Denies bruising or enlarged lymph nodes. Neuro:  Denies any headaches, dizziness, paresthesias. Endo:  Denies any problems with DM, thyroid, adrenal function.  Physical Exam: BP 115/79  Pulse 74  Temp 98.4 F (36.9 C) (Oral)  Ht 5\' 5"  (1.651 m)  Wt 164 lb 12.8 oz (74.753 kg)  BMI 27.42 kg/m2 General:   Alert,  Well-developed, well-nourished, pleasant and cooperative in NAD Head:  Normocephalic and atraumatic. Eyes:  Sclera clear, no icterus.   Conjunctiva pink. Ears:  Normal auditory acuity. Nose:  No deformity, discharge, or lesions. Mouth:  No deformity or lesions,oropharynx pink & moist. Neck:  Supple; no masses or thyromegaly. Lungs:  Clear throughout to auscultation.   No wheezes, crackles, or rhonchi. No acute distress. Heart:  Regular rate and rhythm; no murmurs, clicks, rubs,  or gallops. Abdomen:  Normal bowel sounds.  No bruits.  Soft, non-tender and non-distended without masses, hepatosplenomegaly or hernias noted.  No guarding or rebound tenderness.   Rectal:  Small external hemorrhoid.  No internal masses palpated.  Due for colonoscopy. Msk:  Symmetrical without gross deformities. Normal posture. Pulses:  Normal pulses noted. Extremities:  No clubbing or edema. Neurologic:  Alert and oriented x4;  grossly normal neurologically. Skin:  Intact without significant lesions or  rashes. Lymph Nodes:  No significant cervical adenopathy. Psych:  Alert and cooperative. Normal mood and affect.

## 2012-06-07 NOTE — Assessment & Plan Note (Signed)
She is overdue for follow-up with Dr Jacqualine Mau as she admits to not seeing him in 2013.  She desires to follow-up with him.  I stressed importance, especially for HCC screening.  We re-faxed her notes asking to expedite appt for her at Liver Clinic.    Follow up with [facility] Clinic in [place] We have requested your last labs from Dr Lamar Blinks office  Be sure to discuss your blood in urine with Dr Phillips Odor right away

## 2012-06-07 NOTE — Progress Notes (Signed)
Labs Reviewed from Dr Phillips Odor: 02/25/12: Hgb 15.2, Hct 42.1, Platelets 142, WBC 4.2. AST 58, ALT 70, otherwise normal LFTS, Met 7 normal. 01/08/12 :  ESR 4

## 2012-06-07 NOTE — Patient Instructions (Addendum)
Anamantle HC forte twice daily to your rectum Follow up with [facility] Clinic in [place] Patient is scheduled with the Liver Clinic on 06/09/2012 Titrate your miralax 17 grams as needed Colonoscopy with Dr Jena Gauss Be sure to discuss your blood in urine with Dr Phillips Odor right away WE have requested your last labs from Dr Lamar Blinks office

## 2012-06-07 NOTE — Assessment & Plan Note (Signed)
Due for surveillance colonoscopy with Dr Jena Gauss.  I have discussed risks & benefits which include, but are not limited to, bleeding, infection, perforation & drug reaction.  The patient agrees with this plan & written consent will be obtained.

## 2012-06-07 NOTE — Progress Notes (Signed)
Faxed to PCP

## 2012-06-15 ENCOUNTER — Other Ambulatory Visit (HOSPITAL_COMMUNITY): Payer: Self-pay | Admitting: Urology

## 2012-06-15 DIAGNOSIS — N201 Calculus of ureter: Secondary | ICD-10-CM

## 2012-06-20 ENCOUNTER — Telehealth: Payer: Self-pay | Admitting: *Deleted

## 2012-06-20 ENCOUNTER — Encounter (HOSPITAL_COMMUNITY): Payer: Self-pay | Admitting: Pharmacy Technician

## 2012-06-20 NOTE — Telephone Encounter (Signed)
Spoke with pt- she stated she had an episode last week that was very similar to the episode she had in July 2013. She got a severe cramping pain in her lower abd (that felt like she was giving birth), that lasted for about 2 hours, she had 7 normal bm's and the pain eased some. No N/V, no fever but she did have chills that night.  She has been on a "light diet" for a week.    She is scheduled for tcs next week and also wanted to know if she needed to take any type of abx prior to her procedure for this?   She still has a slight pain in her lower abd and wants to know if she can have something for pain. Please advise.

## 2012-06-20 NOTE — Telephone Encounter (Signed)
Ariana Long called today. She had a diverticulitis attack last week, the pain has subsided a little, but she would like something for the pain and also any other medication if needed. She is scheduled for a colonoscopy next week, and is not sure if this will affect it. Please call her back.

## 2012-06-20 NOTE — Telephone Encounter (Signed)
LMOM for return call.  I will gladly speak w/ her or you may give her the following information. There were no reports of diverticula on last 2 colonoscopies, so it would be unlikely that she has diverticulitis, although not impossible. So long as she feels better, has no tenderness or fever, Keep colonoscopy as planned. If she has severe pain, we need to reevaluate her since this is a new problem prior to colonoscopy. I cannot call in pain meds without evaluating her in person as this is a new problem. Thanks

## 2012-06-21 ENCOUNTER — Ambulatory Visit (HOSPITAL_COMMUNITY)
Admission: RE | Admit: 2012-06-21 | Discharge: 2012-06-21 | Disposition: A | Discharge status: Home / Self Care | Payer: 59 | Source: Ambulatory Visit | Attending: Urology | Admitting: Urology

## 2012-06-21 ENCOUNTER — Encounter (HOSPITAL_COMMUNITY): Payer: Self-pay

## 2012-06-21 DIAGNOSIS — N201 Calculus of ureter: Secondary | ICD-10-CM

## 2012-06-21 DIAGNOSIS — K449 Diaphragmatic hernia without obstruction or gangrene: Secondary | ICD-10-CM | POA: Insufficient documentation

## 2012-06-21 DIAGNOSIS — N200 Calculus of kidney: Secondary | ICD-10-CM | POA: Insufficient documentation

## 2012-06-21 DIAGNOSIS — R109 Unspecified abdominal pain: Secondary | ICD-10-CM | POA: Insufficient documentation

## 2012-06-21 NOTE — Telephone Encounter (Signed)
Pt is aware of OV with AS tomorrow at 0900

## 2012-06-21 NOTE — Telephone Encounter (Signed)
I discussed w/ pt.  Still w/ pain She needs urgent OV. Please arrange.

## 2012-06-22 ENCOUNTER — Encounter: Payer: Self-pay | Admitting: Gastroenterology

## 2012-06-22 ENCOUNTER — Telehealth: Payer: Self-pay | Admitting: *Deleted

## 2012-06-22 ENCOUNTER — Ambulatory Visit (INDEPENDENT_AMBULATORY_CARE_PROVIDER_SITE_OTHER): Payer: 59 | Admitting: Gastroenterology

## 2012-06-22 VITALS — BP 118/77 | HR 74 | Temp 97.4°F | Ht 66.0 in | Wt 159.2 lb

## 2012-06-22 DIAGNOSIS — B1920 Unspecified viral hepatitis C without hepatic coma: Secondary | ICD-10-CM

## 2012-06-22 DIAGNOSIS — D126 Benign neoplasm of colon, unspecified: Secondary | ICD-10-CM

## 2012-06-22 DIAGNOSIS — R161 Splenomegaly, not elsewhere classified: Secondary | ICD-10-CM

## 2012-06-22 MED ORDER — POLYETHYLENE GLYCOL 3350 17 GM/SCOOP PO POWD: 17.0000 g | Freq: Every day | ORAL | Status: DC

## 2012-06-22 MED ORDER — ALIGN PO CAPS: 1.0000 | ORAL_CAPSULE | Freq: Every day | ORAL | Status: DC

## 2012-06-22 NOTE — Progress Notes (Signed)
Referring Provider: Corrie Mckusick, MD Primary Care Physician:  [name], MD Primary Gastroenterologist: Dr. Jena Gauss   Chief Complaint  Patient presents with  . Abdominal Pain    HPI:   Ms. Currin is a pleasant 57 year old female with a history of chronic Hep C, constipation. Followed by Dr. Jacqualine Mau in remote past, and she has had some difficulty in keeping follow-ups. She has not had an EGD to screen for esophageal varices, and she declined this in the past. However, I do not see where there is evidence of cirrhosis on imaging; Dr. Jacqualine Mau mentioned in his note in Aug 2012 that although there was evidence of advanced fibrosis, no need for variceal screening.   She was seen by our practice a few weeks ago to schedule surveillance colonoscopy due to  a large serrated adenoma resected on last colonoscopy July 2013 by Dr. Jena Gauss. However, she called our office yesterday with reports of abdominal pain. Due to this change, we have asked her to present today for further assessment prior to colonoscopy.   Notes last week had lower abdominal cramps like having a baby or kidney stones, had soft bowel movements every 3-4 minutes until emptied out. Ate a fish sandwich from [facility] prior to this episode. Still notes lower abdominal annoying cramping, trying to eat light. Symptoms are improving. Yesterday didn't feel anything. Had chills after the "attack". Normally will have a bowel movement three times a day. No recent antibiotics.    A CT of abd/pelvis was performed 2/18 with right nephrolithiasis, no ureteral stones or hydronephrosis, small hiatal hernia.    Past Medical History  Diagnosis Date  . Osteoporosis   . Hidradenitis   . Thrombocytopenia 01/26/2011  . Hepatitis C 01/26/2011    2005 non responder, genotype 1, Gr 1-2, Stage 3   . Splenomegaly 01/26/2011  . Cirrhosis of liver 01/26/2011    [facility] [place]-nonresponder-HCC surveillance  . Elevated AFP 01/26/2011  . Kidney  stones 10/2011  . Hx: UTI (urinary tract infection)   . Serrated adenoma of colon 11/2011  . Hemorrhoids     Past Surgical History  Procedure Laterality Date  . Cesarean section      x 2  . Axillary gland removal    . Colonoscopy  07/31/2005    Rourk-Normal rectum/Normal colonoscopy/Repeat screening colonoscopy ten years  . Breast biopsy  2011    Right  . Colonoscopy  11/16/2011    RMR: Friable anorectum-likely source of hematochezia/ LARGE 1.2x2.5CM SERRATED ADENOMA resected from ascending colon 7CM DISTAL TO ICV, hyperplastic polyp removed (NEXT COLONOSCOPY  DUE 05/2012)    Current Outpatient Prescriptions  Medication Sig Dispense Refill  . calcium carbonate (OS-CAL) 600 MG TABS Take 1,200-1,800 mg by mouth daily.       . Cholecalciferol (VITAMIN D-3 PO) Take 4,000 Units by mouth daily.      Marland Kitchen lidocaine-hydrocortisone (ANAMANTLE) 3-1 % KIT Place 1 application rectally 2 (two) times daily.  20 each  0  . magnesium oxide (MAG-OX) 400 MG tablet Take 400 mg by mouth 2 (two) times daily.       . Milk Thistle 250 MG CAPS Take 1 capsule by mouth daily.      . polyethylene glycol powder (GLYCOLAX/MIRALAX) powder Take 17 g by mouth daily.      Marland Kitchen triamcinolone cream (KENALOG) 0.5 % Apply 1 application topically daily as needed (to neck.).       Marland Kitchen vitamin E 400 UNIT capsule Take 400 Units by  mouth daily.      . polyethylene glycol-electrolytes (TRILYTE) 420 G solution Take 4,000 mLs by mouth as directed.  4000 mL  0   No current facility-administered medications for this visit.    Allergies as of 06/22/2012  . (No Known Allergies)    Family History  Problem Relation Age of Onset  . Heart attack Mother   . Stroke Mother   . Aneurysm Father     History   Social History  . Marital Status: Married    Spouse Name: N/A    Number of Children: 2  . Years of Education: N/A   Occupational History  . [employer]   Social History Main Topics  . Smoking status: Never  Smoker   . Smokeless tobacco: Never Used  . Alcohol Use: No  . Drug Use: No  . Sexually Active: None   Other Topics Concern  . None   Social History Narrative  . None    Review of Systems: Gen: SEE HPI CV: Denies chest pain, palpitations, syncope, peripheral edema, and claudication. Resp: Denies dyspnea at rest, cough, wheezing, coughing up blood, and pleurisy. GI: SEE HPI Derm: Denies rash, itching, dry skin Psych: Denies depression, anxiety, memory loss, confusion. No homicidal or suicidal ideation.  Heme: Denies bruising, bleeding, and enlarged lymph nodes.  Physical Exam: BP 118/77  Pulse 74  Temp(Src) 97.4 F (36.3 C) (Oral)  Ht 5\' 6"  (1.676 m)  Wt 159 lb 3.2 oz (72.213 kg)  BMI 25.71 kg/m2 General:   Alert and oriented. No distress noted. Pleasant and cooperative.  Head:  Normocephalic and atraumatic. Eyes:  Conjuctiva clear without scleral icterus. Mouth:  Oral mucosa pink and moist. Good dentition. No lesions. Heart:  S1, S2 present without murmurs, rubs, or gallops. Regular rate and rhythm. Abdomen:  +BS, soft, non-tender and non-distended. No rebound or guarding. No HSM or masses noted. Msk:  Symmetrical without gross deformities. Normal posture. Extremities:  Without edema. Neurologic:  Alert and  oriented x4;  grossly normal neurologically. Skin:  Intact without significant lesions or rashes. Psych:  Alert and cooperative. Normal mood and affect.

## 2012-06-22 NOTE — Telephone Encounter (Signed)
Routed to refill box 

## 2012-06-22 NOTE — Progress Notes (Signed)
Faxed to PCP

## 2012-06-22 NOTE — Assessment & Plan Note (Signed)
57 year old pleasant female with large serrated adenoma on colonoscopy July 2013; she is due for surveillance at this time. Full H&P done several weeks ago, but pt has recently noted lower abdominal pain and change in stools. She was therefore brought in for assessment. She notes eating a fish sandwich from [facility] prior to this "attack", and noted lower abdominal cramping with multiple soft bowel movements, no diarrhea. She is continuing to improve and following a bland diet. No fever. Bowel movements have returned to normal. CT on file yesterday without evidence of colitis, diverticulitis, other concerns other than known renal stones (this was ordered by nephrology and will be reviewed by them as well). Prior colonoscopies have not demonstrated diverticulosis, so I doubt diverticulitis. Pt was questioning the need for antibiotics. I feel at this time, no antibiotics are necessary. Rather, continue bland diet, add probiotic, contact us if no improvement in next 24-48 hours OR if any changes in symptoms. Otherwise, continue with colonoscopy.   Proceed with TCS with Dr. Jena Gauss in near future as already scheduled: the risks, benefits, and alternatives have been discussed with the patient in detail. The patient states understanding and desires to proceed. Probiotic daily Continue Miralax

## 2012-06-22 NOTE — Patient Instructions (Addendum)
We have referred you back to the Hepatitis clinic. Please call us if you don't hear from them.  I have sent the prescription for Miralax to your pharmacy.   Unfortunately, we do not have any probiotic samples available. However, you can get this over-the-counter. Some options are [facility], [employer], [facility] brand, [facility], [facility].   Please call us in the next 24-48 hours if things do not improve with a bland diet. We will keep you scheduled for the colonoscopy, but we can postpone this if necessary.  Referral has been faxed over to the Hep C Clinic in [place] Patient is scheduled on 08/10/12 at 2:30

## 2012-06-22 NOTE — Addendum Note (Signed)
Addended by: Joselyn Arrow on: 06/22/2012 10:42 AM   Modules accepted: Orders

## 2012-06-22 NOTE — Telephone Encounter (Signed)
Ariana Long called today after leaving our office to see if getting a prescription for probiotics is cheaper than buying them over the counter, and if so, can she get a prescription for them. Please follow up with her. Thank you.

## 2012-06-22 NOTE — Telephone Encounter (Signed)
I can send Rx but I don't know of any insurance that will pay. We can try.

## 2012-06-22 NOTE — Assessment & Plan Note (Signed)
Seen by Dr. Jacqualine Mau last in 2012. Although PMH states cirrhosis, I do not see on imaging where this is definitive. There is advanced fibrosis, and patient needs to follow-up with Hep C clinic for further consideration of treatment, hepatoma surveillance. Pt has declined EGD in the past, and we will hold off on this now for variceal screening unless cirrhosis becomes evident.

## 2012-06-23 ENCOUNTER — Ambulatory Visit: Payer: 59 | Admitting: Gastroenterology

## 2012-06-27 ENCOUNTER — Telehealth: Payer: Self-pay | Admitting: Internal Medicine

## 2012-06-27 ENCOUNTER — Telehealth: Payer: Self-pay

## 2012-06-27 NOTE — Telephone Encounter (Signed)
Spoke with patient on the phone this afternoon. Concerned she may have had an attack of diverticulitis recently. Awoke from a sound sleep with a right lower quadrant abdominal pain it lasted for 2-1/2 hours and then subsided. It is notable recent CT demonstrated a kidney stone on the right side but no colonic or other inflammation, whatsoever. I told this nice lady it  was unlikely a bout of diverticulitis. Needs a colonoscopy now because of probabl partial resection of the ascending colon polyp. I gave her the option of holding off this coming Friday and  rescheduling her colonoscopy in March. That's what she desires to do. We will reschedule this lady's a followup colonoscopy for sometime in March

## 2012-06-27 NOTE — Telephone Encounter (Signed)
RMR called office and said to cancel tcs and to [facility] colonoscopy in March.

## 2012-06-27 NOTE — Telephone Encounter (Signed)
Left Message with family to return my call

## 2012-06-27 NOTE — Telephone Encounter (Signed)
Pt called- she is extremely concerned about having her tcs on Friday. She stated she still has some pain in her side and wasn't given any abx and is worried that she has diverticulitis  She is taking the probiotics as recommended  Pt wants to cancel her tcs but would like to talk with RMR prior to cancelling it.   Pt is requesting a call from RMR anytime today if possible. Her number is [phone]

## 2012-06-27 NOTE — Telephone Encounter (Signed)
See the prior telephone note made today by me

## 2012-06-28 ENCOUNTER — Other Ambulatory Visit: Payer: Self-pay | Admitting: Internal Medicine

## 2012-06-28 NOTE — Telephone Encounter (Signed)
Patient has been R/S to March 17th with RMR and new instructions have been mailed

## 2012-06-28 NOTE — Telephone Encounter (Signed)
LMOM for patient to return my call.

## 2012-07-18 ENCOUNTER — Ambulatory Visit (HOSPITAL_COMMUNITY)
Admission: RE | Admit: 2012-07-18 | Discharge: 2012-07-18 | Disposition: A | Discharge status: Home / Self Care | Payer: 59 | Source: Ambulatory Visit | Attending: Internal Medicine | Admitting: Internal Medicine

## 2012-07-18 ENCOUNTER — Encounter (HOSPITAL_COMMUNITY): Payer: Self-pay | Admitting: *Deleted

## 2012-07-18 ENCOUNTER — Encounter (HOSPITAL_COMMUNITY)
Admission: RE | Disposition: A | Discharge status: Home / Self Care | Payer: Self-pay | Source: Ambulatory Visit | Attending: Internal Medicine

## 2012-07-18 DIAGNOSIS — K621 Rectal polyp: Secondary | ICD-10-CM

## 2012-07-18 DIAGNOSIS — K620 Anal polyp: Secondary | ICD-10-CM

## 2012-07-18 DIAGNOSIS — K5900 Constipation, unspecified: Secondary | ICD-10-CM

## 2012-07-18 DIAGNOSIS — Z860100 Personal history of colon polyps, unspecified: Secondary | ICD-10-CM | POA: Insufficient documentation

## 2012-07-18 DIAGNOSIS — D126 Benign neoplasm of colon, unspecified: Secondary | ICD-10-CM

## 2012-07-18 DIAGNOSIS — D128 Benign neoplasm of rectum: Secondary | ICD-10-CM | POA: Insufficient documentation

## 2012-07-18 HISTORY — PX: COLONOSCOPY: SHX5424

## 2012-07-18 SURGERY — COLONOSCOPY
Anesthesia: Moderate Sedation

## 2012-07-18 MED ORDER — MEPERIDINE HCL 100 MG/ML IJ SOLN
INTRAMUSCULAR | Status: AC
  Filled 2012-07-18: qty 2

## 2012-07-18 MED ORDER — MIDAZOLAM HCL 5 MG/5ML IJ SOLN
INTRAMUSCULAR | Status: AC
  Filled 2012-07-18: qty 10

## 2012-07-18 MED ORDER — ONDANSETRON HCL 4 MG/2ML IJ SOLN
INTRAMUSCULAR | Status: DC | PRN
  Administered 2012-07-18: 4 mg via INTRAVENOUS

## 2012-07-18 MED ORDER — MEPERIDINE HCL 100 MG/ML IJ SOLN
INTRAMUSCULAR | Status: DC | PRN
  Administered 2012-07-18 (×2): 25 mg via INTRAVENOUS
  Administered 2012-07-18: 50 mg via INTRAVENOUS

## 2012-07-18 MED ORDER — SODIUM CHLORIDE 0.45 % IV SOLN: INTRAVENOUS | Status: DC

## 2012-07-18 MED ORDER — STERILE WATER FOR IRRIGATION IR SOLN
Status: DC | PRN
  Administered 2012-07-18: 10:00:00

## 2012-07-18 MED ORDER — MIDAZOLAM HCL 5 MG/5ML IJ SOLN
INTRAMUSCULAR | Status: DC | PRN
  Administered 2012-07-18 (×2): 1 mg via INTRAVENOUS
  Administered 2012-07-18: 2 mg via INTRAVENOUS

## 2012-07-18 MED ORDER — ONDANSETRON HCL 4 MG/2ML IJ SOLN
INTRAMUSCULAR | Status: AC
  Filled 2012-07-18: qty 2

## 2012-07-18 MED ORDER — SODIUM CHLORIDE 0.9 % IV SOLN
INTRAVENOUS | Status: DC
  Administered 2012-07-18: 09:00:00 via INTRAVENOUS

## 2012-07-18 NOTE — Interval H&P Note (Signed)
History and Physical Interval Note:  07/18/2012 9:39 AM  Ariana Long  has presented today for surgery, with the diagnosis of CONSTIPATION, Serrated adenoma of colon  The various methods of treatment have been discussed with the patient and family. After consideration of risks, benefits and other options for treatment, the patient has consented to  Procedure(s) with comments: COLONOSCOPY (N/A) - 7:30-rescheduled to 9:15am [name] notified pt as a surgical intervention .  The patient's history has been reviewed, patient examined, no change in status, stable for surgery.  I have reviewed the patient's chart and labs.  Questions were answered to the patient's satisfaction.     [name]  Colonoscopy per plan.The risks, benefits, limitations, alternatives and imponderables have been reviewed with the patient. Questions have been answered. All parties are agreeable.

## 2012-07-18 NOTE — H&P (View-Only) (Signed)
Faxed to PCP

## 2012-07-18 NOTE — H&P (View-Only) (Signed)
Referring Provider: Corrie Mckusick, MD Primary Care Physician:  [name], MD Primary Gastroenterologist: Dr. Jena Gauss   Chief Complaint  Patient presents with  . Abdominal Pain    HPI:   [name] is a pleasant 57 year old female with a history of chronic Hep C, constipation. Followed by Dr. Jacqualine Mau in remote past, and she has had some difficulty in keeping follow-ups. She has not had an EGD to screen for esophageal varices, and she declined this in the past. However, I do not see where there is evidence of cirrhosis on imaging; Dr. Jacqualine Mau mentioned in his note in Aug 2012 that although there was evidence of advanced fibrosis, no need for variceal screening.   She was seen by our practice a few weeks ago to schedule surveillance colonoscopy due to  a large serrated adenoma resected on last colonoscopy July 2013 by Dr. Jena Gauss. However, she called our office yesterday with reports of abdominal pain. Due to this change, we have asked her to present today for further assessment prior to colonoscopy.   Notes last week had lower abdominal cramps like having a baby or kidney stones, had soft bowel movements every 3-4 minutes until emptied out. Ate a fish sandwich from [facility] prior to this episode. Still notes lower abdominal annoying cramping, trying to eat light. Symptoms are improving. Yesterday didn't feel anything. Had chills after the "attack". Normally will have a bowel movement three times a day. No recent antibiotics.    A CT of abd/pelvis was performed 2/18 with right nephrolithiasis, no ureteral stones or hydronephrosis, small hiatal hernia.    Past Medical History  Diagnosis Date  . Osteoporosis   . Hidradenitis   . Thrombocytopenia 01/26/2011  . Hepatitis C 01/26/2011    2005 non responder, genotype 1, Gr 1-2, Stage 3   . Splenomegaly 01/26/2011  . Cirrhosis of liver 01/26/2011    [facility] [place]-nonresponder-HCC surveillance  . Elevated AFP 01/26/2011  . Kidney  stones 10/2011  . Hx: UTI (urinary tract infection)   . Serrated adenoma of colon 11/2011  . Hemorrhoids     Past Surgical History  Procedure Laterality Date  . Cesarean section      x 2  . Axillary gland removal    . Colonoscopy  07/31/2005    Rourk-Normal rectum/Normal colonoscopy/Repeat screening colonoscopy ten years  . Breast biopsy  2011    Right  . Colonoscopy  11/16/2011    RMR: Friable anorectum-likely source of hematochezia/ LARGE 1.2x2.5CM SERRATED ADENOMA resected from ascending colon 7CM DISTAL TO ICV, hyperplastic polyp removed (NEXT COLONOSCOPY  DUE 05/2012)    Current Outpatient Prescriptions  Medication Sig Dispense Refill  . calcium carbonate (OS-CAL) 600 MG TABS Take 1,200-1,800 mg by mouth daily.       . Cholecalciferol (VITAMIN D-3 PO) Take 4,000 Units by mouth daily.      Marland Kitchen lidocaine-hydrocortisone (ANAMANTLE) 3-1 % KIT Place 1 application rectally 2 (two) times daily.  20 each  0  . magnesium oxide (MAG-OX) 400 MG tablet Take 400 mg by mouth 2 (two) times daily.       . Milk Thistle 250 MG CAPS Take 1 capsule by mouth daily.      . polyethylene glycol powder (GLYCOLAX/MIRALAX) powder Take 17 g by mouth daily.      Marland Kitchen triamcinolone cream (KENALOG) 0.5 % Apply 1 application topically daily as needed (to neck.).       Marland Kitchen vitamin E 400 UNIT capsule Take 400 Units by  mouth daily.      . polyethylene glycol-electrolytes (TRILYTE) 420 G solution Take 4,000 mLs by mouth as directed.  4000 mL  0   No current facility-administered medications for this visit.    Allergies as of 06/22/2012  . (No Known Allergies)    Family History  Problem Relation Age of Onset  . Heart attack Mother   . Stroke Mother   . Aneurysm Father     History   Social History  . Marital Status: Married    Spouse Name: N/A    Number of Children: 2  . Years of Education: N/A   Occupational History  . [employer]   Social History Main Topics  . Smoking status: Never  Smoker   . Smokeless tobacco: Never Used  . Alcohol Use: No  . Drug Use: No  . Sexually Active: None   Other Topics Concern  . None   Social History Narrative  . None    Review of Systems: Gen: SEE HPI CV: Denies chest pain, palpitations, syncope, peripheral edema, and claudication. Resp: Denies dyspnea at rest, cough, wheezing, coughing up blood, and pleurisy. GI: SEE HPI Derm: Denies rash, itching, dry skin Psych: Denies depression, anxiety, memory loss, confusion. No homicidal or suicidal ideation.  Heme: Denies bruising, bleeding, and enlarged lymph nodes.  Physical Exam: BP 118/77  Pulse 74  Temp(Src) 97.4 F (36.3 C) (Oral)  Ht 5\' 6"  (1.676 m)  Wt 159 lb 3.2 oz (72.213 kg)  BMI 25.71 kg/m2 General:   Alert and oriented. No distress noted. Pleasant and cooperative.  Head:  Normocephalic and atraumatic. Eyes:  Conjuctiva clear without scleral icterus. Mouth:  Oral mucosa pink and moist. Good dentition. No lesions. Heart:  S1, S2 present without murmurs, rubs, or gallops. Regular rate and rhythm. Abdomen:  +BS, soft, non-tender and non-distended. No rebound or guarding. No HSM or masses noted. Msk:  Symmetrical without gross deformities. Normal posture. Extremities:  Without edema. Neurologic:  Alert and  oriented x4;  grossly normal neurologically. Skin:  Intact without significant lesions or rashes. Psych:  Alert and cooperative. Normal mood and affect.

## 2012-07-18 NOTE — Interval H&P Note (Signed)
History and Physical Interval Note:  07/18/2012 9:29 AM  Ariana Long  has presented today for surgery, with the diagnosis of CONSTIPATION, Serrated adenoma of colon  The various methods of treatment have been discussed with the patient and family. After consideration of risks, benefits and other options for treatment, the patient has consented to  Procedure(s) with comments: COLONOSCOPY (N/A) - 7:30-rescheduled to 9:15am Ariana Long notified pt as a surgical intervention .  The patient's history has been reviewed, patient examined, no change in status, stable for surgery.  I have reviewed the patient's chart and labs.  Questions were answered to the patient's satisfaction.     Ariana Long  Colonoscopy per plan.The risks, benefits, limitations, alternatives and imponderables have been reviewed with the patient. Questions have been answered. All parties are agreeable.

## 2012-07-18 NOTE — Op Note (Signed)
[facility] [address] [place], [address]   COLONOSCOPY PROCEDURE REPORT  PATIENT: Ariana Long, Ariana Long  MR#:         [id number] BIRTHDATE: Jul 25, 1955 , 57  yrs. old GENDER: Female ENDOSCOPIST: R.  Roetta Sessions, MD FACP FACG REFERRED BY:  Assunta Found, M.D. PROCEDURE DATE:  07/18/2012 PROCEDURE:     Colonoscopy with biopsy, ablation and snare polypectomy  INDICATIONS: History of colonic adenoma  INFORMED CONSENT:  The risks, benefits, alternatives and imponderables including but not limited to bleeding, perforation as well as the possibility of a missed lesion have been reviewed.  The potential for biopsy, lesion removal, etc. have also been discussed.  Questions have been answered.  All parties agreeable. Please see the history and physical in the medical record for more information.  MEDICATIONS: Versed 4 mg IV and Demerol 100 mg IV in divided doses Zofran 4 mg IV  DESCRIPTION OF PROCEDURE:  After a digital rectal exam was performed, the Pentax Colonoscope [phone]  colonoscope was advanced from the anus through the rectum and colon to the area of the cecum, ileocecal valve and appendiceal orifice.  The cecum was deeply intubated.  These structures were well-seen and photographed for the record.  From the level of the cecum and ileocecal valve, the scope was slowly and cautiously withdrawn.  The mucosal surfaces were carefully surveyed utilizing scope tip deflection to facilitate fold flattening as needed.  The scope was pulled down into the rectum where a thorough examination including retroflexion was performed.    FINDINGS:  Adequate preparation. 3-4 diminutive polyps in the distal rectum; otherwise, normal-appearing rectal mucosa; site of prior tattooing noted in the ascending segment. No residual polyp tissue seen in this location. Just proximal to the  tattooing, behind a fold, there was a 5 mm sessile polyp. The remainder of the colonic mucosa  appeared entirely normal. I did not identify a single diverticulum.  THERAPEUTIC / DIAGNOSTIC MANEUVERS PERFORMED:  The sessile polyp in the ascending segment was hot snare removed.  (1) of the diminutive polyps in the distal rectum was biopsied for histologic study. The remaining diminutive polyps were ablated with the tip of the hot snare loop.  COMPLICATIONS: None  CECAL WITHDRAWAL TIME:  15 minutes  IMPRESSION:  Colonic polyp-removed as described above. Rectal polyps biopsied and ablated as described above  RECOMMENDATIONS: Followup on pathology the   _______________________________ eSigned:  R. Roetta Sessions, MD FACP [facility] 07/18/2012 10:29 AM   CC:

## 2012-07-19 ENCOUNTER — Encounter: Payer: Self-pay | Admitting: Internal Medicine

## 2012-07-20 ENCOUNTER — Telehealth (HOSPITAL_COMMUNITY): Payer: Self-pay | Admitting: *Deleted

## 2012-07-20 ENCOUNTER — Other Ambulatory Visit (HOSPITAL_COMMUNITY): Payer: Self-pay | Admitting: Oncology

## 2012-07-20 ENCOUNTER — Encounter: Payer: Self-pay | Admitting: *Deleted

## 2012-07-20 ENCOUNTER — Encounter (HOSPITAL_COMMUNITY): Payer: Self-pay | Admitting: Internal Medicine

## 2012-07-20 ENCOUNTER — Other Ambulatory Visit: Payer: Self-pay | Admitting: Urgent Care

## 2012-07-20 NOTE — Telephone Encounter (Signed)
Rx written for CBC diff in March 2014 and September 2014.  This will be faxed to [facility]

## 2012-07-20 NOTE — Telephone Encounter (Signed)
Tashema has changed shifts at work and cannot get here to get her labs done while we are open and needs you to write out a order for [facility] across the street so she can get them in the evenings or on Saturday.  I will be glad to fax over the order.Marland KitchenMarland KitchenMarland Kitchen

## 2012-07-21 ENCOUNTER — Other Ambulatory Visit (HOSPITAL_COMMUNITY): Payer: 59

## 2012-08-03 ENCOUNTER — Telehealth: Payer: Self-pay | Admitting: Internal Medicine

## 2012-08-03 NOTE — Telephone Encounter (Signed)
Spoke with pt- went over procedure results with her.

## 2012-08-03 NOTE — Telephone Encounter (Signed)
Patient called this morning asking to speak with RMR's nurse. She wants to know if she has diverticulitis or not. She is unclear about what was told to her. [phone]

## 2012-08-03 NOTE — Telephone Encounter (Signed)
Tried to call pt- LMOM 

## 2012-08-17 ENCOUNTER — Encounter: Payer: Self-pay | Admitting: Oncology

## 2012-09-08 NOTE — Progress Notes (Signed)
TCS MAR 2014  REVIEWED.

## 2012-10-12 ENCOUNTER — Ambulatory Visit (INDEPENDENT_AMBULATORY_CARE_PROVIDER_SITE_OTHER): Payer: 59 | Admitting: Cardiovascular Disease

## 2012-10-12 ENCOUNTER — Encounter: Payer: Self-pay | Admitting: Cardiovascular Disease

## 2012-10-12 VITALS — BP 118/90 | HR 71 | Ht 66.0 in | Wt 160.2 lb

## 2012-10-12 DIAGNOSIS — R002 Palpitations: Secondary | ICD-10-CM | POA: Insufficient documentation

## 2012-10-12 DIAGNOSIS — R011 Cardiac murmur, unspecified: Secondary | ICD-10-CM | POA: Insufficient documentation

## 2012-10-12 MED ORDER — NADOLOL 20 MG PO TABS: 20.0000 mg | ORAL_TABLET | Freq: Every day | ORAL | Status: DC

## 2012-10-12 NOTE — Patient Instructions (Addendum)
Your physician has requested that you have an echocardiogram. Echocardiography is a painless test that uses sound waves to create images of your heart. It provides your doctor with information about the size and shape of your heart and how well your heart's chambers and valves are working. This procedure takes approximately one hour. There are no restrictions for this procedure.  Your physician has recommended that you wear a holter monitor. Holter monitors are medical devices that record the heart's electrical activity. Doctors most often use these monitors to diagnose arrhythmias. Arrhythmias are problems with the speed or rhythm of the heartbeat. The monitor is a small, portable device. You can wear one while you do your normal daily activities. This is usually used to diagnose what is causing palpitations/syncope (passing out).  Your physician has recommended you make the following change in your medication: Nadolol 20 mg daily at bedtime.  Your physician recommends that you schedule a follow-up appointment in: 6 months

## 2012-10-12 NOTE — Progress Notes (Signed)
Patient ID: DONYETTA OGLETREE, female   DOB: 07-23-1955, 57 y.o.   MRN: [id number]     Reason for office visit Palpitations   Mrs. Hebb is a 57 year old woman without previously no structural heart disease who has long-standing chronic hepatitis C with biopsy confirmed cirrhosis but with well compensated liver function.  The last 6 weeks she has noticed very frequent palpitations. These are often present when she wakes up in the morning. They improve slightly when she discontinued caffeine. They do not occur when she exercises (she goes to weekly aerobics and strength exercises). They're not associated with dizziness, syncope, chest pain or dyspnea. She also denies exertional angina or dyspnea.  She does not have systemic hypertension, diabetes, history of smoking or a strong family history of premature coronary disease or other cardiac familial disorders.  She has had hepatitis C probably since 1980 when she received a blood transfusion for complications of pregnancy. Since the 1990s she has repeatedly tried treatment with interferon with multiple side effects but without virological cure. She has elevated alpha-fetoprotein. To her knowledge she does not have esophageal varices or other complications of portal hypertension. However she has a history of thrombocytopenia which may indicate hypersplenism due to portal hypertension.   No Known Allergies  Current Outpatient Prescriptions  Medication Sig Dispense Refill  . calcium carbonate (OS-CAL) 600 MG TABS Take 1,200-1,800 mg by mouth daily.       . Cholecalciferol (VITAMIN D-3 PO) Take 4,000 Units by mouth daily.      Marland Kitchen DIGESTIVE ENZYMES PO Take 1 capsule by mouth 3 (three) times daily before meals.      Marland Kitchen glucosamine-chondroitin 500-400 MG tablet Take 3 tablets by mouth 3 (three) times daily.      . magnesium oxide (MAG-OX) 400 MG tablet Take 400 mg by mouth 2 (two) times daily.       . Milk Thistle 250 MG CAPS Take 1 capsule by mouth  daily.      . Multiple Vitamins-Minerals (COMPLETE MULTIVITAMIN/MINERAL) LIQD Take 1 scoop by mouth daily.      Ailene Ards 3-6-9 Fatty Acids CAPS Take 1 capsule by mouth 3 (three) times daily before meals.      . polyethylene glycol powder (GLYCOLAX/MIRALAX) powder Take 17 g by mouth daily.  255 g  5  . vitamin E 400 UNIT capsule Take 400 Units by mouth daily.      . bifidobacterium infantis (ALIGN) capsule Take 1 capsule by mouth daily.  30 capsule  2  . lidocaine-hydrocortisone (ANAMANTLE) 3-1 % KIT Place 1 application rectally 2 (two) times daily.  20 each  0  . nadolol (CORGARD) 20 MG tablet Take 1 tablet (20 mg total) by mouth at bedtime.  30 tablet  5   No current facility-administered medications for this visit.    Past Medical History  Diagnosis Date  . Osteoporosis   . Hidradenitis   . Thrombocytopenia 01/26/2011  . Hepatitis C 01/26/2011    2005 non responder, genotype 1, Gr 1-2, Stage 3   . Splenomegaly 01/26/2011  . Cirrhosis of liver 01/26/2011    [facility] [place]-nonresponder-HCC surveillance  . Elevated AFP 01/26/2011  . Kidney stones 10/2011  . Hx: UTI (urinary tract infection)   . Serrated adenoma of colon 11/2011  . Hemorrhoids     Past Surgical History  Procedure Laterality Date  . Cesarean section      x 2  . Axillary gland removal  1979    bilateral  .  Colonoscopy  07/31/2005    Rourk-Normal rectum/Normal colonoscopy/Repeat screening colonoscopy ten years  . Breast biopsy  2011    Right  . Colonoscopy  11/16/2011    RMR: Friable anorectum-likely source of hematochezia/ LARGE 1.2x2.5CM SERRATED ADENOMA resected from ascending colon 7CM DISTAL TO ICV, hyperplastic polyp removed (NEXT COLONOSCOPY  DUE 05/2012)  . Colonoscopy N/A 07/18/2012    Procedure: COLONOSCOPY;  Surgeon: Corbin Ade, MD;  Location: [facility];  Service: Endoscopy;  Laterality: N/A;  7:30-rescheduled to 9:15am [name] notified pt    Family History  Problem Relation Age of Onset  .  Heart attack Mother   . Stroke Mother   . Aneurysm Father     History   Social History  . Marital Status: Married    Spouse Name: N/A    Number of Children: 2  . Years of Education: N/A   Occupational History  . [employer]   Social History Main Topics  . Smoking status: Never Smoker   . Smokeless tobacco: Never Used  . Alcohol Use: No  . Drug Use: No  . Sexually Active: Not on file   Other Topics Concern  . Not on file   Social History Narrative  . No narrative on file    Review of systems: The patient specifically denies any chest pain at rest or with exertion, dyspnea at rest or with exertion, orthopnea, paroxysmal nocturnal dyspnea, syncope, focal neurological deficits, intermittent claudication, lower extremity edema, unexplained weight gain, cough, hemoptysis or wheezing.  The patient also denies abdominal pain, nausea, vomiting, dysphagia, diarrhea, constipation, polyuria, polydipsia, dysuria, hematuria, frequency, urgency, abnormal bleeding or bruising, fever, chills, unexpected weight changes, mood swings, change in skin or hair texture, change in voice quality, auditory or visual problems, allergic reactions or rashes, new musculoskeletal complaints other than usual "aches and pains".   PHYSICAL EXAM BP 118/90  Pulse 71  Ht 5\' 6"  (1.676 m)  Wt 160 lb 3.2 oz (72.666 kg)  BMI 25.87 kg/m2  General: Alert, oriented x3, no distress Head: no evidence of trauma, PERRL, EOMI, no exophtalmos or lid lag, no myxedema, no xanthelasma; normal ears, nose and oropharynx Neck: normal jugular venous pulsations and no hepatojugular reflux; brisk carotid pulses without delay and no carotid bruits Chest: clear to auscultation, no signs of consolidation by percussion or palpation, normal fremitus, symmetrical and full respiratory excursions Cardiovascular: normal position and quality of the apical impulse, regular rhythm, normal first and second heart sounds,  grade 1-2/6 early peaking systolic ejection murmur in the aortic focus that seems to change with position, rubs or gallops Abdomen: no tenderness or distention, no masses by palpation, no abnormal pulsatility or arterial bruits, normal bowel sounds, no hepatosplenomegaly, no signs of collateral venous circulation or ascites Extremities: no clubbing, cyanosis or edema; 2+ radial, ulnar and brachial pulses bilaterally; 2+ right femoral, posterior tibial and dorsalis pedis pulses; 2+ left femoral, posterior tibial and dorsalis pedis pulses; no subclavian or femoral bruits Neurological: grossly nonfocal   EKG: Normal sinus rhythm without signs of chamber enlargement or repolarization abnormalities  Lipid Panel  No results found for this basename: chol, trig, hdl, cholhdl, vldl, ldlcalc    BMET    Component Value Date/Time   NA 142 02/25/2012 1030   NA 138 10/30/2011 1115   K 4.1 02/25/2012 1030   K 3.4* 10/30/2011 1115   CL 107 02/25/2012 1030   CL 101 10/30/2011 1115   CO2 26 02/25/2012 1030  CO2 23 10/30/2011 1115   GLUCOSE 123* 10/30/2011 1115   BUN 13 02/25/2012 1030   BUN 17 10/30/2011 1115   CREATININE 0.79 02/25/2012 1030   CREATININE 0.86 10/30/2011 1115   CALCIUM 9.8 02/25/2012 1030   CALCIUM 9.7 10/30/2011 1115   GFRNONAA 73* 10/30/2011 1115   GFRAA 85* 10/30/2011 1115     ASSESSMENT AND PLAN  Ms. Mings has isolated with frequent palpitations which are most likely due to premature atrial contractions or premature ventricular contractions. Have recommended that she wear a 24 Holter monitor to diagnose the exact arrhythmia.  By physical exam there are very few signs of suggest structural heart disease, other than an undiagnosed murmur. Since the murmur appears to be positional it could simply be a innocent flow murmur. Because of her somatic complaints though I have recommended that she have an echocardiogram.  I have offered palliative control of her palpitations with a low  dose of Nadolol, especially since this may actually be beneficial for portal hypertension.  Orders Placed This Encounter  Procedures  . EKG 12-Lead  . Holter monitor - 24 hour  . 2D Echocardiogram without contrast   Meds ordered this encounter  Medications  . Multiple Vitamins-Minerals (COMPLETE MULTIVITAMIN/MINERAL) LIQD    Sig: Take 1 scoop by mouth daily.  Marland Kitchen DIGESTIVE ENZYMES PO    Sig: Take 1 capsule by mouth 3 (three) times daily before meals.  Ailene Ards 3-6-9 Fatty Acids CAPS    Sig: Take 1 capsule by mouth 3 (three) times daily before meals.  Marland Kitchen glucosamine-chondroitin 500-400 MG tablet    Sig: Take 3 tablets by mouth 3 (three) times daily.  . nadolol (CORGARD) 20 MG tablet    Sig: Take 1 tablet (20 mg total) by mouth at bedtime.    Dispense:  30 tablet    Refill:  5    Shironda Kain  Thurmon Fair, MD, [facility] [phone] office [phone] pager

## 2012-10-14 ENCOUNTER — Other Ambulatory Visit: Payer: Self-pay | Admitting: Cardiovascular Disease

## 2012-10-14 DIAGNOSIS — R002 Palpitations: Secondary | ICD-10-CM

## 2012-10-19 ENCOUNTER — Ambulatory Visit: Payer: 59 | Admitting: Cardiovascular Disease

## 2012-10-19 ENCOUNTER — Telehealth: Payer: Self-pay | Admitting: Cardiovascular Disease

## 2012-10-19 ENCOUNTER — Ambulatory Visit (HOSPITAL_COMMUNITY)
Admission: RE | Admit: 2012-10-19 | Discharge: 2012-10-19 | Disposition: A | Discharge status: Home / Self Care | Payer: 59 | Source: Ambulatory Visit | Attending: Cardiovascular Disease | Admitting: Cardiovascular Disease

## 2012-10-19 DIAGNOSIS — R011 Cardiac murmur, unspecified: Secondary | ICD-10-CM | POA: Insufficient documentation

## 2012-10-19 DIAGNOSIS — R002 Palpitations: Secondary | ICD-10-CM

## 2012-10-19 NOTE — Telephone Encounter (Signed)
Returned call and spoke w/ pt.  Pt will come pick up letter.  Retrieved letter from outgoing mailbox and given to front desk to hold for pt pick-up.

## 2012-10-19 NOTE — Telephone Encounter (Signed)
Patient was here this am and had Echo.  Needs note for work.  Please fax to [phone].

## 2012-10-19 NOTE — Telephone Encounter (Signed)
Returned call.  Left message to call back before 4pm. (Mobile and Home)  Note drafted and mailed to pt.  Cannot fax anything w/o signed ROI on file.

## 2012-10-19 NOTE — Telephone Encounter (Signed)
Returning phone call °

## 2012-10-19 NOTE — Progress Notes (Signed)
2D Echo Performed 10/19/2012    Denelda Akerley, RCS  

## 2012-10-21 ENCOUNTER — Telehealth: Payer: Self-pay | Admitting: *Deleted

## 2012-10-21 NOTE — Telephone Encounter (Signed)
LMOM with results of Holter Monitor-normal w/rare PAC's

## 2012-11-26 ENCOUNTER — Encounter: Payer: Self-pay | Admitting: Cardiovascular Disease

## 2012-12-01 ENCOUNTER — Other Ambulatory Visit (HOSPITAL_COMMUNITY)
Admission: RE | Admit: 2012-12-01 | Discharge: 2012-12-01 | Disposition: A | Discharge status: Home / Self Care | Payer: 59 | Source: Ambulatory Visit | Attending: Adult Health | Admitting: Adult Health

## 2012-12-01 ENCOUNTER — Ambulatory Visit (INDEPENDENT_AMBULATORY_CARE_PROVIDER_SITE_OTHER): Payer: 59 | Admitting: Adult Health

## 2012-12-01 ENCOUNTER — Encounter: Payer: Self-pay | Admitting: Adult Health

## 2012-12-01 VITALS — BP 110/68 | HR 76 | Ht 65.0 in | Wt 162.0 lb

## 2012-12-01 DIAGNOSIS — K5900 Constipation, unspecified: Secondary | ICD-10-CM

## 2012-12-01 DIAGNOSIS — K649 Unspecified hemorrhoids: Secondary | ICD-10-CM

## 2012-12-01 DIAGNOSIS — Z1151 Encounter for screening for human papillomavirus (HPV): Secondary | ICD-10-CM | POA: Insufficient documentation

## 2012-12-01 DIAGNOSIS — Z1212 Encounter for screening for malignant neoplasm of rectum: Secondary | ICD-10-CM

## 2012-12-01 DIAGNOSIS — Z01419 Encounter for gynecological examination (general) (routine) without abnormal findings: Secondary | ICD-10-CM | POA: Insufficient documentation

## 2012-12-01 DIAGNOSIS — K7460 Unspecified cirrhosis of liver: Secondary | ICD-10-CM

## 2012-12-01 HISTORY — DX: Unspecified hemorrhoids: K64.9

## 2012-12-01 LAB — HEMOCCULT GUIAC POC 1CARD (OFFICE): Fecal Occult Blood, POC: NEGATIVE

## 2012-12-01 NOTE — Patient Instructions (Addendum)
Physical in 1 year Mammogram yearly(october) colonoscopy per gi Labs with GI Try luvena Try 1/2 cup of applesauce,real oatmeal, and prune juice daily

## 2012-12-01 NOTE — Progress Notes (Signed)
Patient ID: Ariana Long, female   DOB: [birth year], 57 y.o.   MRN: [id number] History of Present Illness: Ariana Long is a 57 year old black female married in for a pap and physical.   Current Medications, Allergies, Past Medical History, Past Surgical History, Family History and Social History were reviewed in [employer] electronic medical record.     Review of Systems: Patient denies any headaches, blurred vision, shortness of breath, chest pain, abdominal pain, problems with urination, she has some dryness with sex and she has constipation. No joint pain or mood changes.She sees Dr Kevin Fenton in [place] for her liver problems.     Physical Exam:BP 110/68  Pulse 76  Ht 5\' 5"  (1.651 m)  Wt 162 lb (73.483 kg)  BMI 26.96 kg/m2 General:  Well developed, well nourished, no acute distress Skin:  Warm and dry Neck:  Midline trachea, normal thyroid Lungs; Clear to auscultation bilaterally Breast:  No dominant palpable mass, retraction, or nipple discharge Cardiovascular: Regular rate and rhythm Abdomen:  Soft, non tender, no hepatosplenomegaly Pelvic:  External genitalia is normal in appearance.  The vagina is normal in appearance.  The cervix is bulbous.Pap performed with HPV.  Uterus is felt to be normal size, shape, and contour.  No                adnexal masses or tenderness noted. Rectal: Good sphincter tone, no polyps, internal hemorrhoids felt.  Hemoccult negative. Extremities:  No swelling or varicosities noted Psych:  Alert and cooperative, seems happy   Impression: Yearly gyn exam Hemorrhoids Constipation Cirrhosis of liver/hept. C    Plan: Yearly physical Mammogram yearly Colonoscopy per GI Labs per GI Try luvena and lubricate with sex Try 1/2 cup each of applesauce,real oatmeal, and prune juice daily.

## 2012-12-26 ENCOUNTER — Ambulatory Visit: Payer: 59 | Admitting: Adult Health

## 2013-01-18 ENCOUNTER — Ambulatory Visit: Payer: 59 | Admitting: Internal Medicine

## 2013-01-20 ENCOUNTER — Other Ambulatory Visit (HOSPITAL_COMMUNITY): Payer: 59

## 2013-01-20 DIAGNOSIS — D731 Hypersplenism: Secondary | ICD-10-CM | POA: Insufficient documentation

## 2013-01-23 ENCOUNTER — Ambulatory Visit (HOSPITAL_COMMUNITY): Payer: 59

## 2013-02-06 ENCOUNTER — Encounter (HOSPITAL_COMMUNITY): Payer: 59 | Attending: Hematology and Oncology

## 2013-02-06 ENCOUNTER — Encounter (HOSPITAL_COMMUNITY): Payer: Self-pay

## 2013-02-06 VITALS — BP 120/80 | HR 67 | Temp 98.7°F | Resp 18 | Wt 161.2 lb

## 2013-02-06 DIAGNOSIS — K7460 Unspecified cirrhosis of liver: Secondary | ICD-10-CM | POA: Insufficient documentation

## 2013-02-06 DIAGNOSIS — M810 Age-related osteoporosis without current pathological fracture: Secondary | ICD-10-CM

## 2013-02-06 DIAGNOSIS — D61818 Other pancytopenia: Secondary | ICD-10-CM

## 2013-02-06 DIAGNOSIS — K625 Hemorrhage of anus and rectum: Secondary | ICD-10-CM

## 2013-02-06 DIAGNOSIS — R161 Splenomegaly, not elsewhere classified: Secondary | ICD-10-CM

## 2013-02-06 DIAGNOSIS — D731 Hypersplenism: Secondary | ICD-10-CM | POA: Insufficient documentation

## 2013-02-06 DIAGNOSIS — D696 Thrombocytopenia, unspecified: Secondary | ICD-10-CM

## 2013-02-06 LAB — COMPREHENSIVE METABOLIC PANEL
ALT: 79 U/L — ABNORMAL HIGH (ref 0–35)
Alkaline Phosphatase: 95 U/L (ref 39–117)
CO2: 25 mEq/L (ref 19–32)
Chloride: 100 mEq/L (ref 96–112)
GFR calc Af Amer: >90 mL/min (ref >90)
GFR calc non Af Amer: >90 mL/min (ref >90)
Glucose, Bld: 87 mg/dL (ref 70–99)
Potassium: 3.9 mEq/L (ref 3.5–5.1)
Sodium: 137 mEq/L (ref 135–145)
Total Bilirubin: 0.5 mg/dL (ref 0.3–1.2)

## 2013-02-06 LAB — CBC WITH DIFFERENTIAL/PLATELET
Eosinophils Relative: 3 % (ref 0–5)
Lymphocytes Relative: 51 % — ABNORMAL HIGH (ref 12–46)
Lymphs Abs: 2.8 10*3/uL (ref 0.7–4.0)
MCV: 94.3 fL (ref 78.0–100.0)
Neutrophils Relative %: 37 % — ABNORMAL LOW (ref 43–77)
Platelets: 144 10*3/uL — ABNORMAL LOW (ref 150–400)
RBC: 4.39 MIL/uL (ref 3.87–5.11)
WBC: 5.4 10*3/uL (ref 4.0–10.5)

## 2013-02-06 NOTE — Patient Instructions (Addendum)
[facility] Discharge Instructions  RECOMMENDATIONS MADE BY THE CONSULTANT AND ANY TEST RESULTS WILL BE SENT TO YOUR REFERRING PHYSICIAN.  Lab work today. Return to clinic in 1 year to see MD. Report any issues/concerns to clinic as needed prior to appointments.  Thank you for choosing [facility] to provide your oncology and hematology care.  To afford each patient quality time with our providers, please arrive at least 15 minutes before your scheduled appointment time.  With your help, our goal is to use those 15 minutes to complete the necessary work-up to ensure our physicians have the information they need to help with your evaluation and healthcare recommendations.    Effective January 1st, 2014, we ask that you re-schedule your appointment with our physicians should you arrive 10 or more minutes late for your appointment.  We strive to give you quality time with our providers, and arriving late affects you and other patients whose appointments are after yours.    Again, thank you for choosing [facility].  Our hope is that these requests will decrease the amount of time that you wait before being seen by our physicians.       _____________________________________________________________  Should you have questions after your visit to [facility], please contact our office at [phone] between the hours of 8:30 a.m. and 5:00 p.m.  Voicemails left after 4:30 p.m. will not be returned until the following business day.  For prescription refill requests, have your pharmacy contact our office with your prescription refill request.

## 2013-02-06 NOTE — Progress Notes (Signed)
[facility] OFFICE PROGRESS NOTE  [name], MD [address] [place] [address]  DIAGNOSIS: Cirrhosis of liver - Plan: AFP tumor marker  Hypersplenism  Chief Complaint  Patient presents with  . Anemia    CURRENT THERAPY: No active therapy  INTERVAL HISTORY: Ariana Long 57 y.o. female returns for followup of hypersplenism in association with cirrhosis due to hepatitis C with splenomegaly and pancytopenia. She does work full-time. Appetite good with no nausea, vomiting, diarrhea, or constipation. Denies easy satiety. No dominant or swelling, lower extremity swelling or redness, cough, shortness of breath, chest pain, palpitations, skin rash, headache, or seizures. One episode of urinary tract infection requiring antibiotics. Minimal bruising. No focal weakness.Jill Alexanders a new therapy for hepatitis C which Dr. Kevin Fenton is investigating.   MEDICAL HISTORY: Past Medical History  Diagnosis Date  . Osteoporosis   . Hidradenitis   . Thrombocytopenia 01/26/2011  . Hepatitis C 01/26/2011    2005 non responder, genotype 1, Gr 1-2, Stage 3   . Splenomegaly 01/26/2011  . Cirrhosis of liver 01/26/2011    FIBROSIS on liver biopsy in 2007, most recent NON-contrast CT without mention of cirrhotic changes. per Dr. Jacqualine Mau (2012) pt is immune to hep A and Hep B.   . Elevated AFP 01/26/2011  . Kidney stones 10/2011  . Hx: UTI (urinary tract infection)   . Serrated adenoma of colon 11/2011  . Hemorrhoids   . Hemorrhoid 12/01/2012  . Tubular adenoma     INTERIM HISTORY: has Thrombocytopenia; Hepatitis C; Splenomegaly; Cirrhosis of liver; Elevated AFP; Anal fissure; Hematochezia; Serrated adenoma of colon; Constipation; Palpitations; Murmur, cardiac; Hemorrhoid; and Hypersplenism on her problem list.    ALLERGIES:  has No Known Allergies.  MEDICATIONS: has a current medication list which includes the following prescription(s): cholecalciferol,  digestive enzymes, glucosamine-chondroitin, lidocaine-hydrocortisone, magnesium oxide, milk thistle, complete multivitamin/mineral, omega 3-6-9 fatty acids, polyethylene glycol powder, nadolol, and vitamin e.  SURGICAL HISTORY:  Past Surgical History  Procedure Laterality Date  . Cesarean section      x 2  . Axillary gland removal  1979    bilateral  . Colonoscopy  07/31/2005    Rourk-Normal rectum/Normal colonoscopy/Repeat screening colonoscopy ten years  . Breast biopsy  2011    Right  . Colonoscopy  11/16/2011    RMR: Friable anorectum-likely source of hematochezia/ LARGE 1.2x2.5CM SERRATED ADENOMA resected from ascending colon 7CM DISTAL TO ICV, hyperplastic polyp removed (NEXT COLONOSCOPY  DUE 05/2012)  . Colonoscopy N/A 07/18/2012    Dr. Jena Gauss- normal appearing rectal mucosa. no residual polpy tissue seen at tatoo location. remainder of colonic mucosa appeared entirely normal. bx = tubular adenoma    FAMILY HISTORY: family history includes Aneurysm in her father; Heart attack in her mother; Stroke in her mother.  SOCIAL HISTORY:  reports that she has never smoked. She has never used smokeless tobacco. She reports that she does not drink alcohol or use illicit drugs.  REVIEW OF SYSTEMS:  Other than that discussed above is noncontributory.  PHYSICAL EXAMINATION: ECOG PERFORMANCE STATUS: 0 - Asymptomatic  Blood pressure 120/80, pulse 67, temperature 98.7 F (37.1 C), temperature source Oral, resp. rate 18, weight 161 lb 3.2 oz (73.12 kg).  GENERAL:alert, no distress and comfortable SKIN: skin color, texture, turgor are normal, no rashes or significant lesions EYES: PERLA; Conjunctiva are pink and non-injected, sclera clear OROPHARYNX:no exudate, no erythema on lips, buccal mucosa, or tongue. NECK: supple, thyroid normal size, non-tender,  without nodularity. No masses CHEST: Normal AP diameter with no breast masses. LYMPH:  no palpable lymphadenopathy in the cervical, axillary or  inguinal LUNGS: clear to auscultation and percussion with normal breathing effort HEART: regular rate & rhythm and no murmurs and no lower extremity edema ABDOMEN:abdomen soft, non-tender and normal bowel sounds. spleen tip palpable. MUSCULOSKELETAL:no cyanosis of digits and no clubbing. Range of motion normal.  NEURO: alert & oriented x 3 with fluent speech, no focal motor/sensory deficits. No evidence of asterixis.   LABORATORY DATA: No visits with results within 30 Day(s) from this visit. Latest known visit with results is:  Office Visit on 12/01/2012  Component Date Value Range Status  . Fecal Occult Blood, POC 12/01/2012 Negative   Final    PATHOLOGY:  Urinalysis    Component Value Date/Time   COLORURINE YELLOW 10/30/2011 1120   APPEARANCEUR CLOUDY* 10/30/2011 1120   LABSPEC 1.030 10/30/2011 1120   PHURINE 5.5 10/30/2011 1120   GLUCOSEU NEGATIVE 10/30/2011 1120   HGBUR TRACE* 10/30/2011 1120   BILIRUBINUR NEGATIVE 10/30/2011 1120   KETONESUR NEGATIVE 10/30/2011 1120   PROTEINUR NEGATIVE 10/30/2011 1120   UROBILINOGEN 0.2 10/30/2011 1120   NITRITE NEGATIVE 10/30/2011 1120   LEUKOCYTESUR NEGATIVE 10/30/2011 1120    RADIOGRAPHIC STUDIES: No results found.  ASSESSMENT: #1. Pancytopenia due to hypersplenism due to cirrhosis of the liver due to hepatitis C. #2. Osteoporosis. #3. Rectal bleeding due to hemorrhoids and colonic polyps, status post resection. #4. History of kidney stones, quiescent.   PLAN: #1. Alpha-fetoprotein was done and is pending. #2. Followup in one year unless new problems arise that are troublesome and persistent. Patient was encouraged to call should these events occur.   All questions were answered. The patient knows to call the clinic with any problems, questions or concerns. We can certainly see the patient much sooner if necessary.   I spent 30 minutes counseling the patient face to face. The total time spent in the appointment was 25 minutes.     Maurilio Lovely, MD 02/06/2013 3:47 PM

## 2013-02-06 NOTE — Progress Notes (Signed)
This encounter was created in error - please disregard.

## 2013-02-06 NOTE — Progress Notes (Signed)
Ariana Long presented for labwork. Labs per MD order drawn via Peripheral Line 23 gauge needle inserted in left antecubital.  Good blood return present. Procedure without incident.  Needle removed intact. Patient tolerated procedure well.

## 2013-02-13 ENCOUNTER — Ambulatory Visit: Payer: 59 | Admitting: Adult Health

## 2013-02-14 ENCOUNTER — Ambulatory Visit: Payer: 59 | Admitting: Internal Medicine

## 2013-03-15 ENCOUNTER — Other Ambulatory Visit: Payer: Self-pay | Admitting: Internal Medicine

## 2013-03-15 DIAGNOSIS — C220 Liver cell carcinoma: Secondary | ICD-10-CM

## 2013-03-23 ENCOUNTER — Other Ambulatory Visit: Payer: 59

## 2013-03-31 ENCOUNTER — Ambulatory Visit
Admission: RE | Admit: 2013-03-31 | Discharge: 2013-03-31 | Disposition: A | Discharge status: Home / Self Care | Payer: 59 | Source: Ambulatory Visit | Attending: Internal Medicine | Admitting: Internal Medicine

## 2013-03-31 DIAGNOSIS — C220 Liver cell carcinoma: Secondary | ICD-10-CM

## 2013-04-03 ENCOUNTER — Other Ambulatory Visit: Payer: 59

## 2013-06-08 ENCOUNTER — Encounter: Payer: Self-pay | Admitting: Internal Medicine

## 2013-06-21 ENCOUNTER — Ambulatory Visit: Payer: 59 | Admitting: Gastroenterology

## 2013-07-07 ENCOUNTER — Other Ambulatory Visit (HOSPITAL_COMMUNITY): Payer: Self-pay | Admitting: Family Medicine

## 2013-07-07 DIAGNOSIS — Z1231 Encounter for screening mammogram for malignant neoplasm of breast: Secondary | ICD-10-CM

## 2013-07-10 ENCOUNTER — Ambulatory Visit (HOSPITAL_COMMUNITY)
Admission: RE | Admit: 2013-07-10 | Discharge: 2013-07-10 | Disposition: A | Discharge status: Home / Self Care | Payer: 59 | Source: Ambulatory Visit | Attending: Family Medicine | Admitting: Family Medicine

## 2013-07-10 DIAGNOSIS — Z1231 Encounter for screening mammogram for malignant neoplasm of breast: Secondary | ICD-10-CM | POA: Insufficient documentation

## 2013-07-11 ENCOUNTER — Ambulatory Visit: Payer: 59 | Admitting: Internal Medicine

## 2013-07-14 ENCOUNTER — Ambulatory Visit: Payer: 59 | Admitting: Internal Medicine

## 2013-08-03 ENCOUNTER — Ambulatory Visit (INDEPENDENT_AMBULATORY_CARE_PROVIDER_SITE_OTHER): Payer: 59 | Admitting: Cardiovascular Disease

## 2013-08-03 ENCOUNTER — Encounter: Payer: Self-pay | Admitting: Cardiovascular Disease

## 2013-08-03 VITALS — BP 114/67 | HR 78 | Resp 16 | Ht 66.0 in | Wt 161.8 lb

## 2013-08-03 DIAGNOSIS — R002 Palpitations: Secondary | ICD-10-CM

## 2013-08-03 NOTE — Patient Instructions (Signed)
Dr Sallyanne Kuster has recommended that you follow-up with him as needed.

## 2013-08-04 ENCOUNTER — Ambulatory Visit: Payer: 59 | Admitting: Cardiovascular Disease

## 2013-08-20 ENCOUNTER — Encounter: Payer: Self-pay | Admitting: Cardiovascular Disease

## 2013-08-20 NOTE — Progress Notes (Signed)
Patient ID: Ariana Long, female   DOB: Feb 09, 1956, 58 y.o.   MRN: [id number]      Reason for office visit F/U palpitations  Her palpitations resolved after she started eating raisins regularly. Echo was normal. Holter only showed rare PACs.  Ariana Long is a 58 year old woman without previously no structural heart disease who has long-standing chronic hepatitis C with biopsy confirmed cirrhosis but with well compensated liver function.  She had noticed very frequent palpitations. These are often present when she wakes up in the morning. They improve slightly when she discontinued caffeine. They do not occur when she exercises (she goes to weekly aerobics and strength exercises). They're not associated with dizziness, syncope, chest pain or dyspnea. She also denies exertional angina or dyspnea.  She does not have systemic hypertension, diabetes, history of smoking or a strong family history of premature coronary disease or other cardiac familial disorders.  She has had hepatitis C probably since 1980 when she received a blood transfusion for complications of pregnancy. Since the 1990s she has repeatedly tried treatment with interferon with multiple side effects but without virological cure. She has elevated alpha-fetoprotein. To her knowledge she does not have esophageal varices or other complications of portal hypertension. However she has a history of thrombocytopenia which may indicate hypersplenism due to portal hypertension.    No Known Allergies  Current Outpatient Prescriptions  Medication Sig Dispense Refill  . Cholecalciferol (VITAMIN D-3 PO) Take 4,000 Units by mouth daily.      Marland Kitchen DIGESTIVE ENZYMES PO Take 1 capsule by mouth 3 (three) times daily before meals.      Marland Kitchen glucosamine-chondroitin 500-400 MG tablet Take 3 tablets by mouth 3 (three) times daily.      Marland Kitchen lidocaine-hydrocortisone (ANAMANTLE) 3-1 % KIT Place 1 application rectally 2 (two) times daily.  20 each  0  .  magnesium oxide (MAG-OX) 400 MG tablet Take 400 mg by mouth 2 (two) times daily.       . Milk Thistle 250 MG CAPS Take 1 capsule by mouth daily.      . Multiple Vitamins-Minerals (COMPLETE MULTIVITAMIN/MINERAL) LIQD Take 1 scoop by mouth daily.      Ariana Long 3-6-9 Fatty Acids CAPS Take 1 capsule by mouth 3 (three) times daily before meals.      . polyethylene glycol powder (GLYCOLAX/MIRALAX) powder Take 17 g by mouth daily.  255 g  5  . vitamin E 400 UNIT capsule Take 400 Units by mouth daily.       No current facility-administered medications for this visit.    Past Medical History  Diagnosis Date  . Osteoporosis   . Hidradenitis   . Thrombocytopenia 01/26/2011  . Hepatitis C 01/26/2011    2005 non responder, genotype 1, Gr 1-2, Stage 3   . Splenomegaly 01/26/2011  . Cirrhosis of liver 01/26/2011    FIBROSIS on liver biopsy in 2007, U/S  03/31/2013= mild diffuse hepatic steatosis and /or hepatocellular disease without focal hepatic parenchymal abnormality. per Dr. Patsy Baltimore (2012) pt is immune to hep A and Hep B.   . Elevated AFP 01/26/2011  . Kidney stones 10/2011  . Hx: UTI (urinary tract infection)   . Serrated adenoma of colon 11/2011  . Hemorrhoids   . Hemorrhoid 12/01/2012  . Tubular adenoma     Past Surgical History  Procedure Laterality Date  . Cesarean section      x 2  . Axillary gland removal  1979    bilateral  .  Colonoscopy  07/31/2005    Rourk-Normal rectum/Normal colonoscopy/Repeat screening colonoscopy ten years  . Breast biopsy  2011    Right  . Colonoscopy  11/16/2011    RMR: Friable anorectum-likely source of hematochezia/ LARGE 1.2x2.5CM SERRATED ADENOMA resected from ascending colon 7CM DISTAL TO ICV, hyperplastic polyp removed   . Colonoscopy N/A 07/18/2012    Dr. Gala Romney- normal appearing rectal mucosa. no residual polpy tissue seen at tatoo location. remainder of colonic mucosa appeared entirely normal. bx = tubular adenoma    Family History  Problem Relation Age  of Onset  . Heart attack Mother   . Stroke Mother   . Aneurysm Father     History   Social History  . Marital Status: Married    Spouse Name: N/A    Number of Children: 2  . Years of Education: N/A   Occupational History  . Machine [employer]   Social History Main Topics  . Smoking status: Never Smoker   . Smokeless tobacco: Never Used  . Alcohol Use: No  . Drug Use: No  . Sexual Activity: Yes    Birth Control/ Protection: Post-menopausal   Other Topics Concern  . Not on file   Social History Narrative  . No narrative on file    Review of systems: The patient specifically denies any chest pain at rest or with exertion, dyspnea at rest or with exertion, orthopnea, paroxysmal nocturnal dyspnea, syncope, palpitations, focal neurological deficits, intermittent claudication, lower extremity edema, unexplained weight gain, cough, hemoptysis or wheezing.  The patient also denies abdominal pain, nausea, vomiting, dysphagia, diarrhea, constipation, polyuria, polydipsia, dysuria, hematuria, frequency, urgency, abnormal bleeding or bruising, fever, chills, unexpected weight changes, mood swings, change in skin or hair texture, change in voice quality, auditory or visual problems, allergic reactions or rashes, new musculoskeletal complaints other than usual "aches and pains".   PHYSICAL EXAM BP 114/67  Pulse 78  Resp 16  Ht '5\' 6"'  (1.676 m)  Wt 161 lb 12.8 oz (73.392 kg)  BMI 26.13 kg/m2  General: Alert, oriented x3, no distress Head: no evidence of trauma, PERRL, EOMI, no exophtalmos or lid lag, no myxedema, no xanthelasma; normal ears, nose and oropharynx Neck: normal jugular venous pulsations and no hepatojugular reflux; brisk carotid pulses without delay and no carotid bruits Chest: clear to auscultation, no signs of consolidation by percussion or palpation, normal fremitus, symmetrical and full respiratory excursions Cardiovascular: normal position and quality of  the apical impulse, regular rhythm, normal first and second heart sounds, no murmurs, rubs or gallops Abdomen: no tenderness or distention, no masses by palpation, no abnormal pulsatility or arterial bruits, normal bowel sounds, no hepatosplenomegaly Extremities: no clubbing, cyanosis or edema; 2+ radial, ulnar and brachial pulses bilaterally; 2+ right femoral, posterior tibial and dorsalis pedis pulses; 2+ left femoral, posterior tibial and dorsalis pedis pulses; no subclavian or femoral bruits Neurological: grossly nonfocal   EKG: NSR  Lipid Panel  No results found for this basename: chol, trig, hdl, cholhdl, vldl, ldlcalc    BMET    Component Value Date/Time   NA 137 02/06/2013 1600   NA 142 02/25/2012 1030   K 3.9 02/06/2013 1600   K 4.1 02/25/2012 1030   CL 100 02/06/2013 1600   CL 107 02/25/2012 1030   CO2 25 02/06/2013 1600   CO2 26 02/25/2012 1030   GLUCOSE 87 02/06/2013 1600   BUN 14 02/06/2013 1600   BUN 13 02/25/2012 1030   CREATININE 0.74 02/06/2013 1600  CREATININE 0.79 02/25/2012 1030   CALCIUM 9.7 02/06/2013 1600   CALCIUM 9.8 02/25/2012 1030   GFRNONAA >90 02/06/2013 1600   GFRAA >90 02/06/2013 1600     ASSESSMENT AND PLAN  PACs manifesting as palpitations, improved with a potassium rich diet, no evidence of structural heart disease. No further investigation or pharmacological therapy is needed.  Patient Instructions  Dr Sallyanne Kuster has recommended that you follow-up with him as needed.    Orders Placed This Encounter  Procedures  . EKG 12-Lead   No orders of the defined types were placed in this encounter.    Stephany Poorman  Sanda Klein, MD, [facility] [phone] office [phone] pager

## 2013-10-03 ENCOUNTER — Other Ambulatory Visit: Payer: Self-pay | Admitting: Nurse Practitioner

## 2013-10-03 DIAGNOSIS — C220 Liver cell carcinoma: Secondary | ICD-10-CM

## 2013-10-12 ENCOUNTER — Other Ambulatory Visit: Payer: Self-pay | Admitting: Nurse Practitioner

## 2013-10-12 ENCOUNTER — Ambulatory Visit: Payer: 59 | Admitting: Adult Health

## 2013-10-12 DIAGNOSIS — C220 Liver cell carcinoma: Secondary | ICD-10-CM

## 2013-10-16 ENCOUNTER — Other Ambulatory Visit: Payer: 59

## 2013-10-17 ENCOUNTER — Emergency Department (HOSPITAL_COMMUNITY): Payer: 59

## 2013-10-17 ENCOUNTER — Emergency Department (HOSPITAL_COMMUNITY)
Admission: EM | Admit: 2013-10-17 | Discharge: 2013-10-17 | Disposition: A | Discharge status: Home / Self Care | Payer: 59 | Attending: Emergency Medicine | Admitting: Emergency Medicine

## 2013-10-17 ENCOUNTER — Encounter (HOSPITAL_COMMUNITY): Payer: Self-pay | Admitting: Emergency Medicine

## 2013-10-17 DIAGNOSIS — N200 Calculus of kidney: Secondary | ICD-10-CM

## 2013-10-17 DIAGNOSIS — Z79899 Other long term (current) drug therapy: Secondary | ICD-10-CM | POA: Insufficient documentation

## 2013-10-17 DIAGNOSIS — Z87440 Personal history of urinary (tract) infections: Secondary | ICD-10-CM | POA: Insufficient documentation

## 2013-10-17 DIAGNOSIS — Z860100 Personal history of colon polyps, unspecified: Secondary | ICD-10-CM | POA: Insufficient documentation

## 2013-10-17 DIAGNOSIS — Z862 Personal history of diseases of the blood and blood-forming organs and certain disorders involving the immune mechanism: Secondary | ICD-10-CM | POA: Insufficient documentation

## 2013-10-17 DIAGNOSIS — Z8619 Personal history of other infectious and parasitic diseases: Secondary | ICD-10-CM | POA: Insufficient documentation

## 2013-10-17 DIAGNOSIS — Z8679 Personal history of other diseases of the circulatory system: Secondary | ICD-10-CM | POA: Insufficient documentation

## 2013-10-17 DIAGNOSIS — Z872 Personal history of diseases of the skin and subcutaneous tissue: Secondary | ICD-10-CM | POA: Insufficient documentation

## 2013-10-17 DIAGNOSIS — Z9889 Other specified postprocedural states: Secondary | ICD-10-CM | POA: Insufficient documentation

## 2013-10-17 DIAGNOSIS — Z8719 Personal history of other diseases of the digestive system: Secondary | ICD-10-CM | POA: Insufficient documentation

## 2013-10-17 DIAGNOSIS — Z8739 Personal history of other diseases of the musculoskeletal system and connective tissue: Secondary | ICD-10-CM | POA: Insufficient documentation

## 2013-10-17 LAB — URINALYSIS, ROUTINE W REFLEX MICROSCOPIC
BILIRUBIN URINE: NEGATIVE
GLUCOSE, UA: NEGATIVE mg/dL
Ketones, ur: NEGATIVE mg/dL
LEUKOCYTES UA: NEGATIVE
Nitrite: NEGATIVE
PROTEIN: NEGATIVE mg/dL
Specific Gravity, Urine: 1.025 (ref 1.005–1.030)
Urobilinogen, UA: 0.2 mg/dL (ref 0.0–1.0)
pH: 5.5 (ref 5.0–8.0)

## 2013-10-17 LAB — CBC WITH DIFFERENTIAL/PLATELET
BASOS PCT: 1 % (ref 0–1)
Basophils Absolute: 0 10*3/uL (ref 0.0–0.1)
Eosinophils Absolute: 0.1 10*3/uL (ref 0.0–0.7)
Eosinophils Relative: 1 % (ref 0–5)
HCT: 39.2 % (ref 36.0–46.0)
Hemoglobin: 13.9 g/dL (ref 12.0–15.0)
LYMPHS ABS: 1.4 10*3/uL (ref 0.7–4.0)
Lymphocytes Relative: 24 % (ref 12–46)
MCH: 32.4 pg (ref 26.0–34.0)
MCHC: 35.5 g/dL (ref 30.0–36.0)
MCV: 91.4 fL (ref 78.0–100.0)
MONOS PCT: 9 % (ref 3–12)
Monocytes Absolute: 0.6 10*3/uL (ref 0.1–1.0)
NEUTROS ABS: 3.9 10*3/uL (ref 1.7–7.7)
NEUTROS PCT: 65 % (ref 43–77)
Platelets: 130 10*3/uL — ABNORMAL LOW (ref 150–400)
RBC: 4.29 MIL/uL (ref 3.87–5.11)
RDW: 12.6 % (ref 11.5–15.5)
WBC: 6 10*3/uL (ref 4.0–10.5)

## 2013-10-17 LAB — COMPREHENSIVE METABOLIC PANEL
ALBUMIN: 3.8 g/dL (ref 3.5–5.2)
ALT: 61 U/L — ABNORMAL HIGH (ref 0–35)
AST: 57 U/L — ABNORMAL HIGH (ref 0–37)
Alkaline Phosphatase: 92 U/L (ref 39–117)
BILIRUBIN TOTAL: 0.4 mg/dL (ref 0.3–1.2)
BUN: 15 mg/dL (ref 6–23)
CHLORIDE: 104 meq/L (ref 96–112)
CO2: 25 mEq/L (ref 19–32)
Calcium: 9.3 mg/dL (ref 8.4–10.5)
Creatinine, Ser: 0.8 mg/dL (ref 0.50–1.10)
GFR calc Af Amer: >90 mL/min (ref >90)
GFR calc non Af Amer: 79 mL/min — ABNORMAL LOW (ref >90)
Glucose, Bld: 139 mg/dL — ABNORMAL HIGH (ref 70–99)
POTASSIUM: 4.1 meq/L (ref 3.7–5.3)
Sodium: 141 mEq/L (ref 137–147)
Total Protein: 8.1 g/dL (ref 6.0–8.3)

## 2013-10-17 LAB — URINE MICROSCOPIC-ADD ON

## 2013-10-17 MED ORDER — SODIUM CHLORIDE 0.9 % IV BOLUS (SEPSIS)
1000.0000 mL | Freq: Once | INTRAVENOUS | Status: AC
  Administered 2013-10-17: 1000 mL via INTRAVENOUS

## 2013-10-17 MED ORDER — HYDROCODONE-ACETAMINOPHEN 5-325 MG PO TABS: 1.0000 | ORAL_TABLET | Freq: Four times a day (QID) | ORAL | Status: DC | PRN

## 2013-10-17 MED ORDER — ONDANSETRON 4 MG PO TBDP: ORAL_TABLET | ORAL | Status: DC

## 2013-10-17 MED ORDER — KETOROLAC TROMETHAMINE 30 MG/ML IJ SOLN
30.0000 mg | Freq: Once | INTRAMUSCULAR | Status: AC
  Administered 2013-10-17: 30 mg via INTRAVENOUS
  Filled 2013-10-17: qty 1

## 2013-10-17 MED ORDER — TAMSULOSIN HCL 0.4 MG PO CAPS: 0.4000 mg | ORAL_CAPSULE | Freq: Every day | ORAL | Status: DC

## 2013-10-17 MED ORDER — FENTANYL CITRATE 0.05 MG/ML IJ SOLN
100.0000 ug | Freq: Once | INTRAMUSCULAR | Status: AC
  Administered 2013-10-17: 100 ug via INTRAVENOUS
  Filled 2013-10-17: qty 2

## 2013-10-17 NOTE — ED Provider Notes (Signed)
CSN: [id number]     Arrival date & time 10/17/13  0532 History   First MD Initiated Contact with Patient 10/17/13 [phone]     Chief Complaint  Patient presents with  . Abdominal Pain     (Consider location/radiation/quality/duration/timing/severity/associated sxs/prior Treatment) HPI This is a 58 year old female with a history of hepatitis C. She is here with abdominal pain that began about 1:00 this morning. The pain is in the lower abdomen and is described as cramping like labor pains. It has been accompanied by watery diarrhea. She was already having loose stools as a result of taking an herbal hawthorn derivative to "clean her out", last dose two days ago. The pain is intermittent and not completely relieved by voiding her bowels. It is mild at the present moment but has been severe. She was nauseated earlier and had one episode of emesis which relieved her nausea. She has had similar symptoms in the past with a kidney stone.  Past Medical History  Diagnosis Date  . Osteoporosis   . Hidradenitis   . Thrombocytopenia 01/26/2011  . Hepatitis C 01/26/2011    2005 non responder, genotype 1, Gr 1-2, Stage 3   . Splenomegaly 01/26/2011  . Cirrhosis of liver 01/26/2011    FIBROSIS on liver biopsy in 2007, U/S  03/31/2013= mild diffuse hepatic steatosis and /or hepatocellular disease without focal hepatic parenchymal abnormality. per Dr. Patsy Baltimore (2012) pt is immune to hep A and Hep B.   . Elevated AFP 01/26/2011  . Kidney stones 10/2011  . Hx: UTI (urinary tract infection)   . Serrated adenoma of colon 11/2011  . Hemorrhoids   . Hemorrhoid 12/01/2012  . Tubular adenoma    Past Surgical History  Procedure Laterality Date  . Cesarean section      x 2  . Axillary gland removal  1979    bilateral  . Colonoscopy  07/31/2005    Rourk-Normal rectum/Normal colonoscopy/Repeat screening colonoscopy ten years  . Breast biopsy  2011    Right  . Colonoscopy  11/16/2011    RMR: Friable anorectum-likely  source of hematochezia/ LARGE 1.2x2.5CM SERRATED ADENOMA resected from ascending colon 7CM DISTAL TO ICV, hyperplastic polyp removed   . Colonoscopy N/A 07/18/2012    Dr. Gala Romney- normal appearing rectal mucosa. no residual polpy tissue seen at tatoo location. remainder of colonic mucosa appeared entirely normal. bx = tubular adenoma   Family History  Problem Relation Age of Onset  . Heart attack Mother   . Stroke Mother   . Aneurysm Father    History  Substance Use Topics  . Smoking status: Never Smoker   . Smokeless tobacco: Never Used  . Alcohol Use: No   OB History   Grav Para Term Preterm Abortions TAB SAB Ect Mult Living   3 3  1      2      Review of Systems  All other systems reviewed and are negative.   Allergies  Review of patient's allergies indicates no known allergies.  Home Medications   Prior to Admission medications   Medication Sig Start Date End Date Taking? Authorizing Provider  Cholecalciferol (VITAMIN D-3 PO) Take 4,000 Units by mouth daily.   Yes Historical Provider, MD  DIGESTIVE ENZYMES PO Take 1 capsule by mouth 3 (three) times daily before meals.   Yes Historical Provider, MD  glucosamine-chondroitin 500-400 MG tablet Take 3 tablets by mouth 3 (three) times daily.   Yes Historical Provider, MD  magnesium oxide (MAG-OX) 400 MG tablet  Take 400 mg by mouth 2 (two) times daily.    Yes Historical Provider, MD  Milk Thistle 250 MG CAPS Take 1 capsule by mouth daily.   Yes Historical Provider, MD  Multiple Vitamins-Minerals (COMPLETE MULTIVITAMIN/MINERAL) LIQD Take 1 scoop by mouth daily.   Yes Historical Provider, MD  Omega 3-6-9 Fatty Acids CAPS Take 1 capsule by mouth 3 (three) times daily before meals.   Yes Historical Provider, MD  polyethylene glycol powder (GLYCOLAX/MIRALAX) powder Take 17 g by mouth daily. 06/22/12  Yes Orvil Feil, NP  vitamin E 400 UNIT capsule Take 400 Units by mouth daily.   Yes Historical Provider, MD   BP 120/76  Pulse 53   Temp(Src) 98.1 F (36.7 C) (Oral)  Resp 18  Ht 5' 5.5" (1.664 m)  Wt 152 lb (68.947 kg)  BMI 24.90 kg/m2  SpO2 96%  Physical Exam General: Well-developed, well-nourished female in no acute distress; appearance consistent with age of record HENT: normocephalic; atraumatic Eyes: pupils equal, round and reactive to light; extraocular muscles intact; scleral icterus Neck: supple Heart: regular rate and rhythm Lungs: clear to auscultation bilaterally Abdomen: soft; nondistended; nontender; no masses or hepatosplenomegaly; bowel sounds present GU: No CVA tenderness Extremities: No deformity; full range of motion; pulses normal; no edema Neurologic: Awake, alert and oriented; motor function intact in all extremities and symmetric; no facial droop Skin: Warm and dry Psychiatric: Normal mood and affect    ED Course  Procedures (including critical care time)   MDM   Nursing notes and vitals signs, including pulse oximetry, reviewed.  Summary of this visit's results, reviewed by myself:  Labs:  Results for orders placed during the hospital encounter of 10/17/13 (from the past 24 hour(s))  CBC WITH DIFFERENTIAL     Status: Abnormal   Collection Time    10/17/13  6:13 AM      Result Value Ref Range   WBC 6.0  4.0 - 10.5 K/uL   RBC 4.29  3.87 - 5.11 MIL/uL   Hemoglobin 13.9  12.0 - 15.0 g/dL   HCT 39.2  36.0 - 46.0 %   MCV 91.4  78.0 - 100.0 fL   MCH 32.4  26.0 - 34.0 pg   MCHC 35.5  30.0 - 36.0 g/dL   RDW 12.6  11.5 - 15.5 %   Platelets 130 (*) 150 - 400 K/uL   Neutrophils Relative % 65  43 - 77 %   Neutro Abs 3.9  1.7 - 7.7 K/uL   Lymphocytes Relative 24  12 - 46 %   Lymphs Abs 1.4  0.7 - 4.0 K/uL   Monocytes Relative 9  3 - 12 %   Monocytes Absolute 0.6  0.1 - 1.0 K/uL   Eosinophils Relative 1  0 - 5 %   Eosinophils Absolute 0.1  0.0 - 0.7 K/uL   Basophils Relative 1  0 - 1 %   Basophils Absolute 0.0  0.0 - 0.1 K/uL  COMPREHENSIVE METABOLIC PANEL     Status: Abnormal    Collection Time    10/17/13  6:13 AM      Result Value Ref Range   Sodium 141  137 - 147 mEq/L   Potassium 4.1  3.7 - 5.3 mEq/L   Chloride 104  96 - 112 mEq/L   CO2 25  19 - 32 mEq/L   Glucose, Bld 139 (*) 70 - 99 mg/dL   BUN 15  6 - 23 mg/dL   Creatinine, Ser 0.80  0.50 - 1.10 mg/dL   Calcium 9.3  8.4 - 10.5 mg/dL   Total Protein 8.1  6.0 - 8.3 g/dL   Albumin 3.8  3.5 - 5.2 g/dL   AST 57 (*) 0 - 37 U/L   ALT 61 (*) 0 - 35 U/L   Alkaline Phosphatase 92  39 - 117 U/L   Total Bilirubin 0.4  0.3 - 1.2 mg/dL   GFR calc non Af Amer 79 (*) >90 mL/min   GFR calc Af Amer >90  >90 mL/min  URINALYSIS, ROUTINE W REFLEX MICROSCOPIC     Status: Abnormal   Collection Time    10/17/13  6:17 AM      Result Value Ref Range   Color, Urine YELLOW  YELLOW   APPearance CLEAR  CLEAR   Specific Gravity, Urine 1.025  1.005 - 1.030   pH 5.5  5.0 - 8.0   Glucose, UA NEGATIVE  NEGATIVE mg/dL   Hgb urine dipstick LARGE (*) NEGATIVE   Bilirubin Urine NEGATIVE  NEGATIVE   Ketones, ur NEGATIVE  NEGATIVE mg/dL   Protein, ur NEGATIVE  NEGATIVE mg/dL   Urobilinogen, UA 0.2  0.0 - 1.0 mg/dL   Nitrite NEGATIVE  NEGATIVE   Leukocytes, UA NEGATIVE  NEGATIVE  URINE MICROSCOPIC-ADD ON     Status: Abnormal   Collection Time    10/17/13  6:17 AM      Result Value Ref Range   RBC / HPF TOO NUMEROUS TO COUNT  <3 RBC/hpf   Bacteria, UA FEW (*) RARE    Imaging Studies: Ct Abdomen Pelvis Wo Contrast  10/17/2013   CLINICAL DATA:  Left lower quadrant pain and hematuria. History of kidney stones.  EXAM: CT ABDOMEN AND PELVIS WITHOUT CONTRAST  TECHNIQUE: Multidetector CT imaging of the abdomen and pelvis was performed following the standard protocol without IV contrast.  COMPARISON:  06/21/2012  FINDINGS: Dependent opacities in the right greater than left lung bases most likely represent atelectasis. There is a small sliding hiatal hernia.  The liver, gallbladder, spleen, adrenal glands, and pancreas have an  unremarkable unenhanced appearance. 4 mm right upper pole and 2 mm right lower pole calculi are unchanged. There are a few punctate, 1 mm calculi in the upper pole of the right kidney which were not present on the prior study. There is no right-sided hydronephrosis. The right ureter is nondilated.  No left renal calculi are identified. There is new, mild left hydronephrosis. There is mild dilatation of the left ureter, and a 2 mm stone is present at the left ureterovesical junction, new from the prior study. Additional calcifications in the pelvis are unchanged and compatible with phleboliths.  The small and large bowel are nondilated. Appendix is unremarkable. Uterus and ovaries are identified. No free fluid or enlarged lymph nodes are identified. Moderate disc degeneration is noted at L3-4 and L4-5.  IMPRESSION: 1. 2 mm left UVJ stone with mild left hydroureteronephrosis. 2. Nonobstructing right nephrolithiasis, slightly increased from prior.   Electronically Signed   By: Logan Bores   On: 10/17/2013 08:17        Wynetta Fines, MD 10/17/13 [phone]

## 2013-10-17 NOTE — Discharge Instructions (Signed)
Follow up with Dr. Jeffie Pollock in [place] or one of his partners at [facility] urology in one week.

## 2013-10-17 NOTE — ED Notes (Signed)
Pt c/o abd pain with diarrhea.

## 2013-10-28 ENCOUNTER — Other Ambulatory Visit: Payer: 59

## 2013-10-29 ENCOUNTER — Ambulatory Visit
Admission: RE | Admit: 2013-10-29 | Discharge: 2013-10-29 | Disposition: A | Discharge status: Home / Self Care | Payer: 59 | Source: Ambulatory Visit | Attending: Nurse Practitioner | Admitting: Nurse Practitioner

## 2013-10-29 DIAGNOSIS — C220 Liver cell carcinoma: Secondary | ICD-10-CM

## 2013-10-29 MED ORDER — GADOXETATE DISODIUM 0.25 MMOL/ML IV SOLN
7.0000 mL | Freq: Once | INTRAVENOUS | Status: AC | PRN
  Administered 2013-10-29: 7 mL via INTRAVENOUS

## 2013-11-27 ENCOUNTER — Other Ambulatory Visit: Payer: 59 | Admitting: Adult Health

## 2013-12-05 ENCOUNTER — Other Ambulatory Visit: Payer: 59 | Admitting: Adult Health

## 2013-12-14 ENCOUNTER — Encounter: Payer: Self-pay | Admitting: Adult Health

## 2013-12-14 ENCOUNTER — Ambulatory Visit (INDEPENDENT_AMBULATORY_CARE_PROVIDER_SITE_OTHER): Payer: 59 | Admitting: Adult Health

## 2013-12-14 VITALS — BP 100/64 | HR 74 | Ht 65.5 in | Wt 155.0 lb

## 2013-12-14 DIAGNOSIS — Z1212 Encounter for screening for malignant neoplasm of rectum: Secondary | ICD-10-CM

## 2013-12-14 DIAGNOSIS — Z01419 Encounter for gynecological examination (general) (routine) without abnormal findings: Secondary | ICD-10-CM

## 2013-12-14 DIAGNOSIS — K648 Other hemorrhoids: Secondary | ICD-10-CM

## 2013-12-14 DIAGNOSIS — N9089 Other specified noninflammatory disorders of vulva and perineum: Secondary | ICD-10-CM | POA: Insufficient documentation

## 2013-12-14 HISTORY — DX: Other specified noninflammatory disorders of vulva and perineum: N90.89

## 2013-12-14 LAB — HEMOCCULT GUIAC POC 1CARD (OFFICE): Fecal Occult Blood, POC: NEGATIVE

## 2013-12-14 MED ORDER — CLOBETASOL PROPIONATE 0.05 % EX OINT: TOPICAL_OINTMENT | CUTANEOUS | Status: DC

## 2013-12-14 NOTE — Patient Instructions (Signed)
Use temovate as directed Follow up in 3 months Physical in 1 year Mammogram yearly Call Dr Gala Romney about BANDING of hemorrhoids Labs with PCP Try astro glide with sex

## 2013-12-14 NOTE — Progress Notes (Signed)
Patient ID: Ariana Long, female   DOB: 05-02-1956, 58 y.o.   MRN: [id number] History of Present Illness: Telissa is a 58 year old black female, married in for a physical, she had a normal pap with negative HPV 12/01/12.She says her Hep C is not detectable now.   Current Medications, Allergies, Past Medical History, Past Surgical History, Family History and Social History were reviewed in [employer] record.     Review of Systems: Patient denies any headaches, blurred vision, shortness of breath, chest pain, abdominal pain, problems with bowel movements, urination, Has pain with sex and her hemorrhoids bother her.No joint pain or mood swings.     Physical Exam:BP 100/64  Pulse 74  Ht 5' 5.5" (1.664 m)  Wt 155 lb (70.308 kg)  BMI 25.39 kg/m2 General:  Well developed, well nourished, no acute distress Skin:  Warm and dry Neck:  Midline trachea, normal thyroid, no carotid bruits heard. Lungs; Clear to auscultation bilaterally Breast:  No dominant palpable mass, retraction, or nipple discharge Cardiovascular: Regular rate and rhythm Abdomen:  Soft, non tender, no hepatosplenomegaly felt Pelvic:  External genitalia has kissing lesions near base of vulva, that are paler in color than surrounding tissue and are puffy and thin in appearance about 5-6 mm round. Dr Glo Herring in to co exam.The vagina is pale with loss of moisture and rugae. The cervix is smooth.  Uterus is felt to be normal size, shape, and contour.  No  adnexal masses or tenderness noted.I gave her a mirror so she could see the lesions Rectal: Good sphincter tone, no polyps, internal hemorrhoids felt.  Hemoccult negative.Has pale spot between buttocks,near top. Extremities:  No swelling or varicosities noted Psych:  No mood changes,alert and cooperative,seems happy   Impression: Yearly gyn exam no pap Vulva lesions Hemorrhoids     Plan: Rx temovate 0.05% ointment use bid x 2 weeks then 2-3 x weekly  #30 gm with 1 refill Try astro glide with sex and increase foreplay, declines estrogen cream at present Physical in 1 year Mammogram yearly  Labs with PCP Call Dr Gala Romney about banding hemorrhoids Review handout on banding Return in 3 months to recheck lesions, will biopsy if persists

## 2014-01-22 ENCOUNTER — Other Ambulatory Visit (HOSPITAL_COMMUNITY): Payer: Self-pay | Admitting: Family Medicine

## 2014-01-22 DIAGNOSIS — M810 Age-related osteoporosis without current pathological fracture: Secondary | ICD-10-CM

## 2014-01-30 ENCOUNTER — Ambulatory Visit (INDEPENDENT_AMBULATORY_CARE_PROVIDER_SITE_OTHER): Payer: 59 | Admitting: Internal Medicine

## 2014-01-30 ENCOUNTER — Encounter: Payer: Self-pay | Admitting: Internal Medicine

## 2014-01-30 VITALS — BP 118/77 | HR 83 | Temp 97.5°F | Ht 65.0 in | Wt 160.2 lb

## 2014-01-30 DIAGNOSIS — K921 Melena: Secondary | ICD-10-CM

## 2014-01-30 DIAGNOSIS — Z86010 Personal history of colonic polyps: Secondary | ICD-10-CM

## 2014-01-30 DIAGNOSIS — K5900 Constipation, unspecified: Secondary | ICD-10-CM

## 2014-01-30 DIAGNOSIS — K648 Other hemorrhoids: Secondary | ICD-10-CM

## 2014-01-30 NOTE — Patient Instructions (Signed)
Avoid straining.  Benefiber 2 teaspoons twice daily  Continue MiraLax daily to avoid constipation.  Limit toilet time to 2-3 minutes  Call with any interim problems  Schedule followup appointment in 2-3 weeks from now

## 2014-01-30 NOTE — Progress Notes (Addendum)
Primary Care Physician:  Purvis Kilts, MD Primary Gastroenterologist:  Dr. Gala Romney  Pre-Procedure History & Physical: HPI:  Ariana Long is a 58 y.o. female here for "hemorrhoids". Has a protruding hemorrhoid he goes in and out from time to time. When constipated his bleeding. History of multiple colonic adenomas removed in the past couple of years; due for surveillance colonoscopy in 2 years. Has HCV infection is on are pending. I understand that she's respond appropriately. Takes MiraLax when necessary constipation no fiber supplement.  Patient may have advanced liver fibrosis and early cirrhosis. Patient did not have any stigmata of portal hypertension in her rectum or colon at recent colonoscopy.  Past Medical History  Diagnosis Date  . Osteoporosis   . Hidradenitis   . Thrombocytopenia 01/26/2011  . Hepatitis C 01/26/2011    2005 non responder, genotype 1, Gr 1-2, Stage 3 . being treated with Harvoni at [facility].  Marland Kitchen Splenomegaly 01/26/2011  . Cirrhosis of liver 01/26/2011    FIBROSIS on liver biopsy in 2007, U/S  03/31/2013= mild diffuse hepatic steatosis and /or hepatocellular disease without focal hepatic parenchymal abnormality. per Dr. Patsy Baltimore (2012) pt is immune to hep A and Hep B. per 12/06/13 note from Liver clinic-pt had MRI which showed cirrhosis.  . Elevated AFP 01/26/2011  . Kidney stones 10/2011  . Hx: UTI (urinary tract infection)   . Serrated adenoma of colon 11/2011  . Hemorrhoids   . Hemorrhoid 12/01/2012  . Tubular adenoma   . Vulval lesion 12/14/2013    Has kissing lesions thin and puffy and paler i color about 5-6- mm in diameter.Dr Glo Herring in to co examine will try temovate and recheck in 3 months  . Diverticulitis of colon     Past Surgical History  Procedure Laterality Date  . Cesarean section      x 2  . Axillary gland removal  1979    bilateral  . Colonoscopy  07/31/2005    Adriena Manfre-Normal rectum/Normal colonoscopy/Repeat screening colonoscopy  ten years  . Breast biopsy  2011    Right  . Colonoscopy  11/16/2011    RMR: Friable anorectum-likely source of hematochezia/ LARGE 1.2x2.5CM SERRATED ADENOMA resected from ascending colon 7CM DISTAL TO ICV, hyperplastic polyp removed   . Colonoscopy N/A 07/18/2012    Dr. Gala Romney- normal appearing rectal mucosa. no residual polpy tissue seen at tatoo location. remainder of colonic mucosa appeared entirely normal. bx = tubular adenoma    Prior to Admission medications   Medication Sig Start Date End Date Taking? Authorizing Provider  Cholecalciferol (VITAMIN D-3 PO) Take 4,000 Units by mouth daily.   Yes Historical Provider, MD  clobetasol ointment (TEMOVATE) 0.05 % Use 2 x daily x 2 weeks then 2-3 x weekly thereafter to affected area 12/14/13  Yes Estill Dooms, NP  DIGESTIVE ENZYMES PO Take 1 capsule by mouth 3 (three) times daily before meals.   Yes Historical Provider, MD  glucosamine-chondroitin 500-400 MG tablet Take 3 tablets by mouth 3 (three) times daily.   Yes Historical Provider, MD  HARVONI 90-400 MG TABS  12/05/13  Yes Historical Provider, MD  magnesium oxide (MAG-OX) 400 MG tablet Take 400 mg by mouth 2 (two) times daily.    Yes Historical Provider, MD  Milk Thistle 250 MG CAPS Take 1 capsule by mouth daily.   Yes Historical Provider, MD  Multiple Vitamins-Minerals (COMPLETE MULTIVITAMIN/MINERAL) LIQD Take 1 scoop by mouth daily.   Yes Historical Provider, MD  Omega 3-6-9  Fatty Acids CAPS Take 1 capsule by mouth 3 (three) times daily before meals.   Yes Historical Provider, MD  polyethylene glycol powder (GLYCOLAX/MIRALAX) powder Take 17 g by mouth daily. 06/22/12  Yes Orvil Feil, NP  vitamin E 400 UNIT capsule Take 400 Units by mouth daily.   Yes Historical Provider, MD    Allergies as of 01/30/2014  . (No Known Allergies)    Family History  Problem Relation Age of Onset  . Heart attack Mother   . Stroke Mother   . Aneurysm Father     History   Social History  .  Marital Status: Married    Spouse Name: N/A    Number of Children: 2  . Years of Education: N/A   Occupational History  . Machine [employer]   Social History Main Topics  . Smoking status: Never Smoker   . Smokeless tobacco: Never Used  . Alcohol Use: No  . Drug Use: No  . Sexual Activity: Yes    Birth Control/ Protection: Post-menopausal   Other Topics Concern  . Not on file   Social History Narrative  . No narrative on file    Review of Systems: See HPI, otherwise negative ROS  Physical Exam: BP 118/77  Pulse 83  Temp(Src) 97.5 F (36.4 C) (Oral)  Ht 5\' 5"  (1.651 m)  Wt 160 lb 3.2 oz (72.666 kg)  BMI 26.66 kg/m2 General:   Alert,  Well-developed, well-nourished, pleasant and cooperative in NAD Skin:  Intact without significant lesions or rashes. Eyes:  Sclera clear, no icterus.   Conjunctiva pink. Ears:  Normal auditory acuity. Nose:  No deformity, discharge,  or lesions. Mouth:  No deformity or lesions. Neck:  Supple; no masses or thyromegaly. No significant cervical adenopathy. Lungs:  Clear throughout to auscultation.   No wheezes, crackles, or rhonchi. No acute distress. Heart:  Regular rate and rhythm; no murmurs, clicks, rubs,  or gallops. Abdomen: Non-distended, normal bowel sounds.  Soft and nontender without appreciable mass or hepatosplenomegaly.  Pulses:  Normal pulses noted. Extremities:  Without clubbing or edema. Rectal:  Small external hemorrhoid tag emanating from the right side of the rectum;  Easily reducible otherwise negative exam Impression:  58 year old lady with HCV infection undergoing treatment for harvoni. She has grade 3 hemorrhoids which is bothersome. She has intermittent constipation. She has failed topical agents. She desires banding. Constipation managed with MiraLax. No fiber supplementation. History of colonic adenoma; due for surveillance colonoscopy in 2 years.  Recommendations: Continue MiraLax 17 g orally daily as  needed. Benefiber 2 teaspoons twice daily to her regimen. We'll plan to put her first band on today.  Risks, benefits, limitations, alternatives reviewed.     Notice: This dictation was prepared with Dragon dictation along with smaller phrase technology. Any transcriptional errors that result from this process are unintentional and may not be corrected upon review.      Jerome banding procedure note:  The patient presents with symptomatic grade 3 hemorrhoids - unresponsive to maximal medical therapy - requesting rubber band ligation of her hemorrhoidal disease. All risks, benefits, and alternative forms of therapy were described and informed consent was obtained.  In the left lateral decubitus position, digital rectal exam performed. Patient had one small grade 3 hemorrhoid tag coming from the right side of her rectum. Otherwise negative.  The decision was made to band the right anterior internal hemorrhoid;  the [place] was used to perform band ligation without complication. Digital  anorectal examination was then performed to assure proper positioning of the band and to adjust the banded tissue as required. The patient had some pain following deployment although the band was felt to be in excellent position. I loosened it up a bit digitally. She thought she was having some pain emanating from the anal canal. I did apply pea-sized amount of .125% nitroglycerin ointment. We observed for 10 minutes. She continued to have some degree of pinching /pain going back in digitally palpated the band. It was felt to be most likely well above the anal verge as I could just reaching with the tip of my finger. I loosened up additionally. It was fairly loose; I felt further manipulation and it would completely fall off. I subsequently applied some 5% lidocaine ointment to the anorectum. Discomfort resolved. The patient was discharged home without pain or other issues. Dietary and behavioral recommendations  were given. No complications were encountered and the patient tolerated the procedure well.

## 2014-01-31 ENCOUNTER — Telehealth: Payer: Self-pay | Admitting: Internal Medicine

## 2014-01-31 NOTE — Telephone Encounter (Signed)
Patient forgot to get letter for work at her appointment, would like one faxed to the medical department at [place]. Fax number is [phone]

## 2014-01-31 NOTE — Telephone Encounter (Signed)
Letter done and faxed. Tried to call pt- NA. Need to know how she is doing if she calls back.

## 2014-02-05 ENCOUNTER — Ambulatory Visit (HOSPITAL_COMMUNITY): Payer: 59

## 2014-02-06 ENCOUNTER — Other Ambulatory Visit (HOSPITAL_COMMUNITY): Payer: 59

## 2014-02-07 ENCOUNTER — Other Ambulatory Visit (HOSPITAL_COMMUNITY): Payer: 59

## 2014-02-20 ENCOUNTER — Encounter: Payer: Self-pay | Admitting: Internal Medicine

## 2014-02-20 ENCOUNTER — Ambulatory Visit (INDEPENDENT_AMBULATORY_CARE_PROVIDER_SITE_OTHER): Payer: 59 | Admitting: Internal Medicine

## 2014-02-20 VITALS — BP 113/72 | HR 65 | Temp 97.0°F | Resp 18 | Ht 65.5 in | Wt 161.0 lb

## 2014-02-20 DIAGNOSIS — K642 Third degree hemorrhoids: Secondary | ICD-10-CM

## 2014-02-20 NOTE — Patient Instructions (Signed)
Avoid straining.  Benefiber 2 teaspoons twice daily  Limit toilet time to 2-3 minutes  Call with any interim problems  Schedule followup appointment in 3 months

## 2014-02-20 NOTE — Progress Notes (Signed)
Patient returns today after having the right anterior hemorrhoid columns banded a few weeks ago. She states leakage, soilage, bleeding and protrusion improved although they returned a bit just 2 days ago. She had significant pain after band placement in the office previously. The band was loosened. By the next day, she had no pain. 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 She's not interested in having any further bands placed today.   Impression: Grade 3 hemorrhoids  -  status post placement of one band previously   Recommendations:\  Avoid straining.  Benefiber 2 teaspoons twice daily  Limit toilet time to 2-3 minutes  Call with any interim problems  Schedule followup appointment in 3 months

## 2014-03-01 ENCOUNTER — Encounter: Payer: Self-pay | Admitting: Internal Medicine

## 2014-03-05 ENCOUNTER — Encounter: Payer: Self-pay | Admitting: Internal Medicine

## 2014-03-14 ENCOUNTER — Ambulatory Visit (HOSPITAL_COMMUNITY)
Admission: RE | Admit: 2014-03-14 | Discharge: 2014-03-14 | Disposition: A | Discharge status: Home / Self Care | Payer: 59 | Source: Ambulatory Visit | Attending: Family Medicine | Admitting: Family Medicine

## 2014-03-14 DIAGNOSIS — Z780 Asymptomatic menopausal state: Secondary | ICD-10-CM | POA: Insufficient documentation

## 2014-03-14 DIAGNOSIS — M810 Age-related osteoporosis without current pathological fracture: Secondary | ICD-10-CM

## 2014-03-14 DIAGNOSIS — M8580 Other specified disorders of bone density and structure, unspecified site: Secondary | ICD-10-CM | POA: Diagnosis not present

## 2014-03-16 ENCOUNTER — Ambulatory Visit: Payer: 59 | Admitting: Adult Health

## 2014-03-30 ENCOUNTER — Emergency Department (HOSPITAL_COMMUNITY)
Admission: EM | Admit: 2014-03-30 | Discharge: 2014-03-30 | Disposition: A | Discharge status: Home / Self Care | Payer: 59 | Source: Home / Self Care | Attending: Emergency Medicine | Admitting: Emergency Medicine

## 2014-03-30 ENCOUNTER — Encounter (HOSPITAL_COMMUNITY): Payer: Self-pay | Admitting: Emergency Medicine

## 2014-03-30 DIAGNOSIS — H16201 Unspecified keratoconjunctivitis, right eye: Secondary | ICD-10-CM

## 2014-03-30 MED ORDER — TETRACAINE HCL 0.5 % OP SOLN
OPHTHALMIC | Status: AC
  Filled 2014-03-30: qty 2

## 2014-03-30 NOTE — ED Notes (Signed)
Patient c/o right eye pain onset x 2 days ago. Patient reports she felt like something was in her eye and so she rubbed it. Now eye is visibly red and sensitive to light. Patient reports she has not experienced visual changes. Patient is in NAD.

## 2014-03-30 NOTE — ED Provider Notes (Signed)
  Chief Complaint   Eye Pain   History of Present Illness   DEJAE BERNET is a 58 year old female who has had a three-day history of pain, redness, and light sensitivity the right eye with clear discharge and tearing. She denies any injury to the eye or foreign body. There is no swelling of the lids. Her vision has been normal. The patient states she had something similar to this several years ago. She was told by an optometrist that she had "arthritis in her eye." It got better on its own.  Review of Systems   Other than as noted above, the patient denies any of the following symptoms: Systemic:  No fever, chills, or headache. Eye:  No blurred vision, or diplopia. ENT:  No nasal congestion, rhinorrhea, or sore throat. Lymphatic:  No adenopathy. Skin:  No rash or pruritis.  [place]   Past medical history, family history, social history, meds, and allergies were reviewed.    Physical Examination    Vital signs:  BP 133/95 mmHg  Pulse 64  Temp(Src) 98.2 F (36.8 C) (Oral)  Resp 18  SpO2 96%  Visual Acuity:  Right Eye Distance: 20/40 Left Eye Distance: 20/20 Bilateral Distance: 20/20  General:  Alert and in no distress. Eye:  The eyelids are normal. Conjunctiva is markedly injected. There is lots of tearing but no purulent drainage. No foreign body. Staining of the cornea reveals uptake around the limbus in a spotty distribution. The central portion of the cornea was normal and had no forcing uptake. The anterior chamber was normal, the pupil was constricted, small, early reactive. She had a full range of extraocular movements. Fundi were benign. ENT:  TMs and canals clear.  Nasal mucosa normal.  No intra-oral lesions, mucous membranes moist, pharynx clear. Neck:  No adenopathy tenderness or mass. Skin:  Clear, warm and dry.  Assessment   The encounter diagnosis was Keratoconjunctivitis, right.  Differential diagnosis includes glaucoma or iritis.  I called the on-call  ophthalmologist for today, Dr. Nino Glow who agreed to see her at his office immediately. She was told to go straight there.  Plan     1.  Meds:  The following meds were prescribed:   Discharge Medication List as of 03/30/2014 10:49 AM      2.  Patient Education/Counseling:  The patient was given appropriate handouts, self care instructions, and instructed in symptomatic relief.    3.  Follow up:  The patient was told to straight to Dr. Mellissa Kohut office, and given address and phone number.     Harden Mo, MD 03/30/14 [phone]

## 2014-03-30 NOTE — Discharge Instructions (Signed)
Viral Conjunctivitis Conjunctivitis is an irritation (inflammation) of the clear membrane that covers the white part of the eye (the conjunctiva). The irritation can also happen on the underside of the eyelids. Conjunctivitis makes the eye red or pink in color. This is what is commonly known as pink eye. Viral conjunctivitis can spread easily (contagious). CAUSES   Infection from virus on the surface of the eye.  Infection from the irritation or injury of nearby tissues such as the eyelids or cornea.  More serious inflammation or infection on the inside of the eye.  Other eye diseases.  The use of certain eye medications. SYMPTOMS  The normally white color of the eye or the underside of the eyelid is usually pink or red in color. The pink eye is usually associated with irritation, tearing and some sensitivity to light. Viral conjunctivitis is often associated with a clear, watery discharge. If a discharge is present, there may also be some blurred vision in the affected eye. DIAGNOSIS  Conjunctivitis is diagnosed by an eye exam. The eye specialist looks for changes in the surface tissues of the eye which take on changes characteristic of the specific types of conjunctivitis. A sample of any discharge may be collected on a Q-Tip (sterile swap). The sample will be sent to a lab to see whether or not the inflammation is caused by bacterial or viral infection. TREATMENT  Viral conjunctivitis will not respond to medicines that kill germs (antibiotics). Treatment is aimed at stopping a bacterial infection on top of the viral infection. The goal of treatment is to relieve symptoms (such as itching) with antihistamine drops or other eye medications.  HOME CARE INSTRUCTIONS   To ease discomfort, apply a cool, clean wash cloth to your eye for 10 to 20 minutes, 3 to 4 times a day.  Gently wipe away any drainage from the eye with a warm, wet washcloth or a cotton ball.  Wash your hands often with soap  and use paper towels to dry.  Do not share towels or washcloths. This may spread the infection.  Change or wash your pillowcase every day.  You should not use eye make-up until the infection is gone.  Stop using contacts lenses. Ask your eye professional how to sterilize or replace them before using again. This depends on the type of contact lenses used.  Do not touch the edge of the eyelid with the eye drop bottle or ointment tube when applying medications to the affected eye. This will stop you from spreading the infection to the other eye or to others. SEEK IMMEDIATE MEDICAL CARE IF:   The infection has not improved within 3 days of beginning treatment.  A watery discharge from the eye develops.  Pain in the eye increases.  The redness is spreading.  Vision becomes blurred.  An oral temperature above 102 F (38.9 C) develops, or as your caregiver suggests.  Facial pain, redness or swelling develops.  Any problems that may be related to the prescribed medicine develop. MAKE SURE YOU:   Understand these instructions.  Will watch your condition.  Will get help right away if you are not doing well or get worse. Document Released: 04/20/2005 Document Revised: 07/13/2011 Document Reviewed: 12/08/2007 ExitCare Patient Information 2015 [employer]. This information is not intended to replace advice given to you by your health care provider. Make sure you discuss any questions you have with your health care provider.  

## 2014-04-10 ENCOUNTER — Ambulatory Visit: Payer: 59 | Admitting: Adult Health

## 2014-04-10 ENCOUNTER — Other Ambulatory Visit: Payer: Self-pay | Admitting: Nurse Practitioner

## 2014-04-10 DIAGNOSIS — B1920 Unspecified viral hepatitis C without hepatic coma: Secondary | ICD-10-CM

## 2014-04-10 DIAGNOSIS — R772 Abnormality of alphafetoprotein: Secondary | ICD-10-CM

## 2014-04-10 DIAGNOSIS — K7469 Other cirrhosis of liver: Secondary | ICD-10-CM

## 2014-05-01 ENCOUNTER — Ambulatory Visit (INDEPENDENT_AMBULATORY_CARE_PROVIDER_SITE_OTHER): Payer: 59 | Admitting: Adult Health

## 2014-05-01 ENCOUNTER — Encounter: Payer: Self-pay | Admitting: Adult Health

## 2014-05-01 VITALS — BP 116/72 | Ht 65.0 in | Wt 158.5 lb

## 2014-05-01 DIAGNOSIS — N9089 Other specified noninflammatory disorders of vulva and perineum: Secondary | ICD-10-CM

## 2014-05-01 NOTE — Patient Instructions (Signed)
Return in 2 days for vulva biopsy with Dr Glo Herring

## 2014-05-01 NOTE — Progress Notes (Signed)
Subjective:     Patient ID: Ariana Long, female   DOB: [birth year], 58 y.o.   MRN: [id number]  HPI Ariana Long is a 58 year old black female in for vulva check, has been using temovate.  Review of Systems See HPI Reviewed past medical,surgical, social and family history. Reviewed medications and allergies.     Objective:   Physical Exam BP 116/72 mmHg  Ht 5\' 5"  (1.651 m)  Wt 158 lb 8 oz (71.895 kg)  BMI 26.38 kg/m2   Skin warm and dry.Pelvic: external genitalia is normal in appearance,except has kissing lesions at base of vulva, still puffy but not has red, vagina:dry with loss of color and rugae, cervix:smooth, uterus: normal size, shape and contour, non tender, no masses felt, adnexa: no masses or tenderness noted.Discussed having Dr Ariana Long look at lesion on right and biopsy in 2 days while on vacation.  Assessment:     Labial lesion     Plan:     Return in 2 days for vulva biopsy with Dr Ariana Long

## 2014-05-03 ENCOUNTER — Other Ambulatory Visit: Payer: 59

## 2014-05-03 ENCOUNTER — Ambulatory Visit (INDEPENDENT_AMBULATORY_CARE_PROVIDER_SITE_OTHER): Payer: 59 | Admitting: Obstetrics and Gynecology

## 2014-05-03 ENCOUNTER — Encounter: Payer: Self-pay | Admitting: Obstetrics and Gynecology

## 2014-05-03 VITALS — BP 120/80 | Ht 65.0 in | Wt 157.0 lb

## 2014-05-03 DIAGNOSIS — L309 Dermatitis, unspecified: Secondary | ICD-10-CM

## 2014-05-03 NOTE — Progress Notes (Signed)
Patient ID: Ariana Long, female   DOB: 1956-01-02, 58 y.o.   MRN: [id number] Pt here today for vulvar biopsy. Pt seen by Anderson Malta the other day.    [facility] Visit  Patient name: Ariana Long MRN [id number]  Date of birth: [birth year]  CC & HPI:  Ariana Long is a 58 y.o. female presenting today for possible biopsy.  ROS:  See note by Jag.  Pertinent History Reviewed:   Reviewed: Significant for  Medical          Social History: Reviewed -  reports that she has never smoked. She has never used smokeless tobacco.  Objective Findings:  Vitals: Blood pressure 120/80, height 5\' 5"  (1.651 m), weight 157 lb (71.215 kg).  Physical Examination: Pelvic - VULVA: lichenification of both labial majora in hair bearing areas and mons pubis. Longstanding. Kissing lesion s have essentially resolved n response to topical creams Rx'd.   Assessment & Plan:   A:  1. Eczema   P:  1. Continue steroid creams.

## 2014-05-08 ENCOUNTER — Ambulatory Visit
Admission: RE | Admit: 2014-05-08 | Discharge: 2014-05-08 | Disposition: A | Discharge status: Home / Self Care | Payer: 59 | Source: Ambulatory Visit | Attending: Nurse Practitioner | Admitting: Nurse Practitioner

## 2014-05-08 DIAGNOSIS — B1920 Unspecified viral hepatitis C without hepatic coma: Secondary | ICD-10-CM

## 2014-05-08 DIAGNOSIS — R772 Abnormality of alphafetoprotein: Secondary | ICD-10-CM

## 2014-05-08 DIAGNOSIS — K7469 Other cirrhosis of liver: Secondary | ICD-10-CM

## 2014-05-18 ENCOUNTER — Ambulatory Visit: Payer: Self-pay | Admitting: Gastroenterology

## 2014-05-23 ENCOUNTER — Ambulatory Visit: Payer: 59 | Admitting: Gastroenterology

## 2014-06-27 ENCOUNTER — Ambulatory Visit: Payer: 59 | Admitting: Gastroenterology

## 2014-06-27 ENCOUNTER — Encounter: Payer: Self-pay | Admitting: Gastroenterology

## 2014-06-27 ENCOUNTER — Telehealth: Payer: Self-pay | Admitting: Gastroenterology

## 2014-06-27 NOTE — Telephone Encounter (Signed)
PATIENT WAS A NO SHOW 06/27/14 AND LETTER WAS SENT  °

## 2014-06-29 ENCOUNTER — Other Ambulatory Visit (HOSPITAL_COMMUNITY): Payer: Self-pay | Admitting: Family Medicine

## 2014-06-29 ENCOUNTER — Ambulatory Visit (HOSPITAL_COMMUNITY)
Admission: RE | Admit: 2014-06-29 | Discharge: 2014-06-29 | Disposition: A | Discharge status: Home / Self Care | Payer: 59 | Source: Ambulatory Visit | Attending: Family Medicine | Admitting: Family Medicine

## 2014-06-29 DIAGNOSIS — M545 Low back pain: Secondary | ICD-10-CM

## 2014-07-09 ENCOUNTER — Other Ambulatory Visit (HOSPITAL_COMMUNITY): Payer: Self-pay | Admitting: Family Medicine

## 2014-07-09 DIAGNOSIS — Z1231 Encounter for screening mammogram for malignant neoplasm of breast: Secondary | ICD-10-CM

## 2014-07-16 ENCOUNTER — Ambulatory Visit (HOSPITAL_COMMUNITY)
Admission: RE | Admit: 2014-07-16 | Discharge: 2014-07-16 | Disposition: A | Discharge status: Home / Self Care | Payer: 59 | Source: Ambulatory Visit | Attending: Family Medicine | Admitting: Family Medicine

## 2014-07-16 DIAGNOSIS — Z1231 Encounter for screening mammogram for malignant neoplasm of breast: Secondary | ICD-10-CM | POA: Insufficient documentation

## 2014-09-11 ENCOUNTER — Ambulatory Visit (INDEPENDENT_AMBULATORY_CARE_PROVIDER_SITE_OTHER): Payer: 59 | Admitting: Internal Medicine

## 2014-09-11 ENCOUNTER — Encounter: Payer: Self-pay | Admitting: Internal Medicine

## 2014-09-11 VITALS — BP 119/75 | HR 85 | Temp 98.4°F | Ht 65.0 in | Wt 158.0 lb

## 2014-09-11 DIAGNOSIS — K649 Unspecified hemorrhoids: Secondary | ICD-10-CM | POA: Diagnosis not present

## 2014-09-11 DIAGNOSIS — K5900 Constipation, unspecified: Secondary | ICD-10-CM | POA: Diagnosis not present

## 2014-09-11 DIAGNOSIS — K7469 Other cirrhosis of liver: Secondary | ICD-10-CM | POA: Diagnosis not present

## 2014-09-11 NOTE — Patient Instructions (Signed)
Continue to follow-up at [facility] liver clinic for periodic liver x-rays  Use Benefiber twice daily; use Miralax as needed for constipation  Office visit in 1 year and as needed

## 2014-09-11 NOTE — Progress Notes (Signed)
Primary Care Physician:  Purvis Kilts, MD Primary Gastroenterologist:  Dr. Gala Romney  Pre-Procedure History & Physical: HPI:  Ariana Long is a 59 y.o. female here for followup. Status post hemorrhoid banding therapy previously. Still has a tendency towards constipation;   one small tag that protrudes from time to time. It's worse when she takes MiraLax. She endorses that she does not use Benefiber on a regular basis. She's not had any bleeding. She completed Harvoni treatment for hep C. Reportedly, she has cleared the virus. History of mildly elevated alpha-fetoprotein. She has had an MRI last year and an ultrasound earlier this year with no evidence of hepatoma. Clinically, she is doing well. Large serrated adenoma removed in 2013 with followup colonoscopy 2014 demonstrating no residual tumor. Will be due for surveillance examination in about 3 years.  Past Medical History  Diagnosis Date  . Osteoporosis   . Hidradenitis   . Thrombocytopenia 01/26/2011  . Hepatitis C 01/26/2011    2005 non responder, genotype 1, Gr 1-2, Stage 3 . being treated with Harvoni at the Liver clinic.  Ariana Long Kitchen Splenomegaly 01/26/2011  . Cirrhosis of liver 01/26/2011    FIBROSIS on liver biopsy in 2007, U/S  03/31/2013= mild diffuse hepatic steatosis and /or hepatocellular disease without focal hepatic parenchymal abnormality. per Dr. Patsy Baltimore (2012) pt is immune to hep A and Hep B. per 12/06/13 note from Liver clinic-pt had MRI which showed cirrhosis.  . Elevated AFP 01/26/2011  . Kidney stones 10/2011  . Hx: UTI (urinary tract infection)   . Serrated adenoma of colon 11/2011  . Hemorrhoids   . Hemorrhoid 12/01/2012  . Tubular adenoma   . Vulval lesion 12/14/2013    Has kissing lesions thin and puffy and paler i color about 5-6- mm in diameter.Dr Glo Herring in to co examine will try temovate and recheck in 3 months  . Diverticulitis of colon     Past Surgical History  Procedure Laterality Date  . Cesarean section     x 2  . Axillary gland removal  1979    bilateral  . Colonoscopy  07/31/2005    Eduard Penkala-Normal rectum/Normal colonoscopy/Repeat screening colonoscopy ten years  . Breast biopsy  2011    Right  . Colonoscopy  11/16/2011    RMR: Friable anorectum-likely source of hematochezia/ LARGE 1.2x2.5CM SERRATED ADENOMA resected from ascending colon 7CM DISTAL TO ICV, hyperplastic polyp removed   . Colonoscopy N/A 07/18/2012    Dr. Gala Romney- normal appearing rectal mucosa. no residual polpy tissue seen at tatoo location. remainder of colonic mucosa appeared entirely normal. bx = tubular adenoma  . Hemorrhoid banding    . Skin lesion excision      Prior to Admission medications   Medication Sig Start Date End Date Taking? Authorizing Provider  Cholecalciferol (VITAMIN D-3 PO) Take 4,000 Units by mouth daily.   Yes Historical Provider, MD  clobetasol ointment (TEMOVATE) 0.05 % Use 2 x daily x 2 weeks then 2-3 x weekly thereafter to affected area 12/14/13  Yes Estill Dooms, NP  DIGESTIVE ENZYMES PO Take 1 capsule by mouth 3 (three) times daily before meals.   Yes Historical Provider, MD  glucosamine-chondroitin 500-400 MG tablet Take 3 tablets by mouth 3 (three) times daily.   Yes Historical Provider, MD  magnesium oxide (MAG-OX) 400 MG tablet Take 400 mg by mouth 2 (two) times daily.    Yes Historical Provider, MD  Milk Thistle 250 MG CAPS Take 1 capsule by mouth daily.  Yes Historical Provider, MD  Multiple Vitamins-Minerals (COMPLETE MULTIVITAMIN/MINERAL) LIQD Take 1 scoop by mouth daily.   Yes Historical Provider, MD  Omega 3-6-9 Fatty Acids CAPS Take 1 capsule by mouth 3 (three) times daily before meals.   Yes Historical Provider, MD  VIGAMOX 0.5 % ophthalmic solution Place 1 drop into the right eye every 4 (four) hours.  04/28/14  Yes Historical Provider, MD  vitamin E 400 UNIT capsule Take 400 Units by mouth daily.   Yes Historical Provider, MD  polyethylene glycol powder (GLYCOLAX/MIRALAX) powder  Take 17 g by mouth daily. Patient not taking: Reported on 09/11/2014 06/22/12   Orvil Feil, NP    Allergies as of 09/11/2014  . (No Known Allergies)    Family History  Problem Relation Age of Onset  . Heart attack Mother   . Stroke Mother   . Aneurysm Father     History   Social History  . Marital Status: Married    Spouse Name: N/A  . Number of Children: 2  . Years of Education: N/A   Occupational History  . Machine [employer]   Social History Main Topics  . Smoking status: Never Smoker   . Smokeless tobacco: Never Used  . Alcohol Use: No  . Drug Use: No  . Sexual Activity: Yes    Birth Control/ Protection: Post-menopausal   Other Topics Concern  . Not on file   Social History Narrative    Review of Systems: See HPI, otherwise negative ROS  Physical Exam: BP 119/75 mmHg  Pulse 85  Temp(Src) 98.4 F (36.9 C)  Ht 5\' 5"  (1.651 m)  Wt 158 lb (71.668 kg)  BMI 26.29 kg/m2 General:   Alert,  Well-developed, well-nourished, pleasant and cooperative in NAD Skin:  Intact without significant lesions or rashes. Eyes:  Sclera clear, no icterus.   Conjunctiva pink. Ears:  Normal auditory acuity. Nose:  No deformity, discharge,  or lesions. Mouth:  No deformity or lesions. Neck:  Supple; no masses or thyromegaly. No significant cervical adenopathy. Lungs:  Clear throughout to auscultation.   No wheezes, crackles, or rhonchi. No acute distress. Heart:  Regular rate and rhythm; no murmurs, clicks, rubs,  or gallops. Abdomen: Non-distended, normal bowel sounds.  Soft and nontender without appreciable mass or hepatosplenomegaly.  Pulses:  Normal pulses noted. Extremities:  Without clubbing or edema.  Impression:  Pleasant 59 year old lady status post eradication of hepatitis C genotype 1. She does have cirrhosis. Mildly elevated alpha-fetoprotein previously without evidence of hepatoma on cross sectional imaging. She is now actively followed by the [facility] liver  clinic. Large serrated adenoma removed 2013 with negative followup 2014. By and large a very good result from hemorrhoid banding. Intermittent constipation  Recommendations:  Continue to follow-up at [facility] liver clinic for periodic liver x-rays,  etc  Use Benefiber twice daily every day; use Miralax as needed for constipation  Office visit in 1 year and as needed  Future surveillance TCS as planned    Notice: This dictation was prepared with Dragon dictation along with smaller phrase technology. Any transcriptional errors that result from this process are unintentional and may not be corrected upon review.

## 2014-12-03 ENCOUNTER — Other Ambulatory Visit: Payer: Self-pay | Admitting: Nurse Practitioner

## 2014-12-03 DIAGNOSIS — C220 Liver cell carcinoma: Secondary | ICD-10-CM

## 2014-12-04 ENCOUNTER — Encounter: Payer: Self-pay | Admitting: Internal Medicine

## 2014-12-12 ENCOUNTER — Encounter: Payer: Self-pay | Admitting: Adult Health

## 2014-12-12 ENCOUNTER — Ambulatory Visit (INDEPENDENT_AMBULATORY_CARE_PROVIDER_SITE_OTHER): Payer: Commercial Managed Care - HMO | Admitting: Adult Health

## 2014-12-12 VITALS — BP 108/74 | HR 60 | Ht 66.5 in | Wt 157.5 lb

## 2014-12-12 DIAGNOSIS — N898 Other specified noninflammatory disorders of vagina: Secondary | ICD-10-CM

## 2014-12-12 DIAGNOSIS — L298 Other pruritus: Secondary | ICD-10-CM | POA: Diagnosis not present

## 2014-12-12 DIAGNOSIS — R319 Hematuria, unspecified: Secondary | ICD-10-CM | POA: Diagnosis not present

## 2014-12-12 HISTORY — DX: Hematuria, unspecified: R31.9

## 2014-12-12 LAB — POCT URINALYSIS DIPSTICK
Glucose, UA: NEGATIVE
Leukocytes, UA: NEGATIVE
Nitrite, UA: NEGATIVE
PROTEIN UA: NEGATIVE

## 2014-12-12 LAB — POCT WET PREP (WET MOUNT)

## 2014-12-12 NOTE — Patient Instructions (Signed)
Use temovate 2-3 x weekly  Increase water Follow up prn

## 2014-12-12 NOTE — Progress Notes (Signed)
Subjective:     Patient ID: Ariana Long, female   DOB: [birth year], 59 y.o.   MRN: [id number]  HPI Ariana Long is a 59 year old black female in complaining of vaginal itch, and vulva dry at times, has some stomach and low back pain and is tired, but is working 3rd shift now and daughter is getting married Saturday, so she has not rested much.She is getting abdominal US soon to assess liver, has history of cirrhosis and her hepatitis C is not detected now.  Review of Systems Patient denies any headaches, hearing loss, blurred vision, shortness of breath, chest pain, problems with bowel movements, urination, or intercourse. No joint pain or mood swings. See HPI for positives.  Reviewed past medical,surgical, social and family history. Reviewed medications and allergies.     Objective:   Physical Exam BP 108/74 mmHg  Pulse 60  Ht 5' 6.5" (1.689 m)  Wt 157 lb 8 oz (71.442 kg)  BMI 25.04 kg/m2urine trace blood, Skin warm and dry.Pelvic: external genitalia is normal in appearance no lesions, vagina:scant white discharge without odor,urethra has no lesions or masses noted, cervix:smooth and bulbous, uterus: normal size, shape and contour, non tender, no masses felt, adnexa: no masses or tenderness noted. Bladder is non tender and no masses felt. Wet prep: few WBC, No CVAT    Assessment:     Vaginal itch Hematuria     Plan:     UA C&S sent, will call if + Increase water Use temovate 2-3 x a week for a while on labia Follow up prn

## 2014-12-13 LAB — URINALYSIS, ROUTINE W REFLEX MICROSCOPIC
Bilirubin, UA: NEGATIVE
GLUCOSE, UA: NEGATIVE
Ketones, UA: NEGATIVE
Leukocytes, UA: NEGATIVE
NITRITE UA: NEGATIVE
Protein, UA: NEGATIVE
RBC, UA: NEGATIVE
SPEC GRAV UA: 1.015 (ref 1.005–1.030)
UUROB: 0.2 mg/dL (ref 0.2–1.0)
pH, UA: 6 (ref 5.0–7.5)

## 2014-12-14 LAB — URINE CULTURE

## 2014-12-17 ENCOUNTER — Other Ambulatory Visit: Payer: 59

## 2014-12-26 ENCOUNTER — Other Ambulatory Visit: Payer: Commercial Managed Care - HMO

## 2014-12-27 ENCOUNTER — Ambulatory Visit: Payer: 59 | Admitting: Gastroenterology

## 2015-01-04 ENCOUNTER — Ambulatory Visit
Admission: RE | Admit: 2015-01-04 | Discharge: 2015-01-04 | Disposition: A | Discharge status: Home / Self Care | Payer: Commercial Managed Care - HMO | Source: Ambulatory Visit | Attending: Nurse Practitioner | Admitting: Nurse Practitioner

## 2015-01-04 DIAGNOSIS — C220 Liver cell carcinoma: Secondary | ICD-10-CM

## 2015-01-17 ENCOUNTER — Ambulatory Visit: Payer: Commercial Managed Care - HMO | Admitting: Gastroenterology

## 2015-01-22 ENCOUNTER — Encounter: Payer: Self-pay | Admitting: Gastroenterology

## 2015-01-22 ENCOUNTER — Ambulatory Visit (INDEPENDENT_AMBULATORY_CARE_PROVIDER_SITE_OTHER): Payer: Commercial Managed Care - HMO | Admitting: Gastroenterology

## 2015-01-22 VITALS — BP 107/71 | HR 84 | Temp 97.0°F | Ht 65.0 in | Wt 157.8 lb

## 2015-01-22 DIAGNOSIS — K7469 Other cirrhosis of liver: Secondary | ICD-10-CM | POA: Diagnosis not present

## 2015-01-22 NOTE — Progress Notes (Signed)
Please send copy to Roosevelt Locks, NP at [place].

## 2015-01-22 NOTE — Progress Notes (Signed)
Primary Care Physician:  Purvis Kilts, MD  Primary Gastroenterologist:  Garfield Cornea, MD   Chief Complaint  Patient presents with  . EGD    HPI:  Ariana Long is a 59 y.o. female here for follow-up. Received request to perform an EGD to screen for varices at the recommendations of [place] with [facility] liver care. Patient most recently seen at [facility] liver care in August of this year. She is approximately 9 months status post completion of 24 weeks of therapy with Harvoni. She has cirrhosis based on MRI imaging last year (10/2013). She's had thrombocytopenia and likely has some portal hypertension. Recent ultrasound negative for hepatoma.  Overall patient feels well. She is due for surveillance colonoscopy in March 2017 for history of tubular adenomas. She has never had an EGD. She continues to have some bright red blood per rectum which she relates to her hemorrhoids. Underwent hemorrhoid banding 1, she had difficulty with rectal pain and therefore decided not to complete further hemorrhoid banding. Bowel movements are regular. Denies abdominal pain. Appetite is good. No problems with heartburn, vomiting, dysphagia, unintentional weight loss.    Current Outpatient Prescriptions  Medication Sig Dispense Refill  . Cholecalciferol (VITAMIN D-3 PO) Take 4,000 Units by mouth daily.    . clobetasol ointment (TEMOVATE) 0.05 % Use 2 x daily x 2 weeks then 2-3 x weekly thereafter to affected area 30 g 1  . magnesium oxide (MAG-OX) 400 MG tablet Take 400 mg by mouth daily.     . Milk Thistle 250 MG CAPS Take 1 capsule by mouth daily.    . Multiple Vitamins-Minerals (COMPLETE MULTIVITAMIN/MINERAL) LIQD Take 1 scoop by mouth daily.    [name] 3-6-9 Fatty Acids CAPS Take 1 capsule by mouth daily.     . vitamin E 400 UNIT capsule Take 400 Units by mouth daily.     No current facility-administered medications for this visit.    Allergies as of 01/22/2015  . (No Known Allergies)    Past  Medical History  Diagnosis Date  . Osteoporosis   . Hidradenitis   . Thrombocytopenia 01/26/2011  . Hepatitis C 01/26/2011    2005 non responder, genotype 1, Gr 1-2, Stage 3 . Treated with Harvoni at the Liver clinic, responder  . Splenomegaly 01/26/2011  . Cirrhosis of liver 01/26/2011    FIBROSIS on liver biopsy in 2007, U/S  03/31/2013= mild diffuse hepatic steatosis and /or hepatocellular disease without focal hepatic parenchymal abnormality. per Dr. Patsy Baltimore (2012) pt is immune to hep A and Hep B. per 12/06/13 note from Liver clinic-pt had MRI which showed cirrhosis 2015.  Marland Kitchen Elevated AFP 01/26/2011  . Kidney stones 10/2011  . Hx: UTI (urinary tract infection)   . Serrated adenoma of colon 11/2011  . Hemorrhoids   . Hemorrhoid 12/01/2012  . Tubular adenoma   . Vulval lesion 12/14/2013    Has kissing lesions thin and puffy and paler i color about 5-6- mm in diameter.Dr Glo Herring in to co examine will try temovate and recheck in 3 months  . Diverticulitis of colon   . Hematuria 12/12/2014    Past Surgical History  Procedure Laterality Date  . Cesarean section      x 2  . Axillary gland removal  1979    bilateral  . Colonoscopy  07/31/2005    Rourk-Normal rectum/Normal colonoscopy/Repeat screening colonoscopy ten years  . Breast biopsy  2011    Right  . Colonoscopy  11/16/2011    RMR:  Friable anorectum-likely source of hematochezia/ LARGE 1.2x2.5CM SERRATED ADENOMA resected from ascending colon 7CM DISTAL TO ICV, hyperplastic polyp removed   . Colonoscopy N/A 07/18/2012    Dr. Gala Romney- normal appearing rectal mucosa. no residual polpy tissue seen at tatoo location. remainder of colonic mucosa appeared entirely normal. bx = tubular adenoma  . Hemorrhoid banding    . Skin lesion excision      Family History  Problem Relation Age of Onset  . Heart attack Mother   . Stroke Mother   . Aneurysm Father     Social History   Social History  . Marital Status: Married    Spouse Name: N/A  .  Number of Children: 2  . Years of Education: N/A   Occupational History  . Machine [employer]   Social History Main Topics  . Smoking status: Never Smoker   . Smokeless tobacco: Never Used  . Alcohol Use: No  . Drug Use: No  . Sexual Activity: Yes    Birth Control/ Protection: Post-menopausal   Other Topics Concern  . Not on file   Social History Narrative      ROS:  General: Negative for anorexia, weight loss, fever, chills, fatigue, weakness. Eyes: Negative for vision changes.  ENT: Negative for hoarseness, difficulty swallowing , nasal congestion. CV: Negative for chest pain, angina, palpitations, dyspnea on exertion, peripheral edema.  Respiratory: Negative for dyspnea at rest, dyspnea on exertion, cough, sputum, wheezing.  GI: See history of present illness. GU:  Negative for dysuria, hematuria, urinary incontinence, urinary frequency, nocturnal urination.  MS: Negative for joint pain, low back pain.  Derm: Negative for rash or itching.  Neuro: Negative for weakness, abnormal sensation, seizure, frequent headaches, memory loss, confusion.  Psych: Negative for anxiety, depression, suicidal ideation, hallucinations.  Endo: Negative for unusual weight change.  Heme: Negative for bruising or bleeding. Allergy: Negative for rash or hives.    Physical Examination:  BP 107/71 mmHg  Pulse 84  Temp(Src) 97 F (36.1 C) (Oral)  Ht 5\' 5"  (1.651 m)  Wt 157 lb 12.8 oz (71.578 kg)  BMI 26.26 kg/m2   General: Well-nourished, well-developed in no acute distress.  Head: Normocephalic, atraumatic.   Eyes: Conjunctiva pink, no icterus. Mouth: Oropharyngeal mucosa moist and pink , no lesions erythema or exudate. Neck: Supple without thyromegaly, masses, or lymphadenopathy.  Lungs: Clear to auscultation bilaterally.  Heart: Regular rate and rhythm, no murmurs rubs or gallops.  Abdomen: Bowel sounds are normal, nontender, nondistended, no hepatosplenomegaly or masses,  no abdominal bruits or    hernia , no rebound or guarding.   Rectal: not performed Extremities: No lower extremity edema. No clubbing or deformities.  Neuro: Alert and oriented x 4 , grossly normal neurologically.  Skin: Warm and dry, no rash or jaundice.   Psych: Alert and cooperative, normal mood and affect.  Labs: Her [facility] liver care note, latest labs from June 2016. White blood cell count 4900, hemoglobin 13.1, platelets 140,000, total bilirubin 0.4, alkaline phosphatase 74, AST 28, ALT 25, HCV RNA not detected.  Imaging Studies: US Abdomen Limited Ruq  2015/01/06   CLINICAL DATA:  History of hepatitis C.  Question liver mass.  EXAM: US ABDOMEN LIMITED - RIGHT UPPER QUADRANT  COMPARISON:  Right upper quadrant ultrasound 05/08/2014. CT abdomen and pelvis 10/16/2013.  FINDINGS: Gallbladder:  No gallstones or wall thickening visualized. No sonographic Murphy sign noted.  Common bile duct:  Diameter: 0.3 cm  Liver:  No focal lesion identified. Within  normal limits in parenchymal echogenicity.  IMPRESSION: Negative for mass.  Negative exam.   Electronically Signed   By: Inge Rise M.D.   On: 01/04/2015 08:52

## 2015-01-22 NOTE — Assessment & Plan Note (Signed)
59 year old female with history of hepatitis C who completed 24 weeks of treatment with Harvoni last year and has a sustained viral response he was documented to have cirrhosis based on MRI findings June 2015. She is up-to-date on hepatoma screening which is been orchestrated by [facility] liver care. We've been asked to consider EGD for screening for esophageal varices.  I have discussed this with the patient today. She is due for surveillance colonoscopy next March and would like to postpone EGD until that time. This is a reasonable request. We will plan on seeing her back in March 2017 and schedule both EGD and colonoscopy at that time.  In the interim, patient is aware that we cannot guarantee that she would not have upper GI bleeding related to potential esophageal varices. Warning signs discussed at length with patient. She is willing to accept this risk.

## 2015-01-22 NOTE — Patient Instructions (Signed)
1. Return to the office in 07/2015 to schedule your colonoscopy and upper endoscopy at that time. Please call in the interim with any questions or concerns.

## 2015-01-22 NOTE — Progress Notes (Signed)
CC'ED TO PCP 

## 2015-01-22 NOTE — Progress Notes (Signed)
cc'ed to [place] attn [facility]

## 2015-04-02 ENCOUNTER — Other Ambulatory Visit: Payer: Self-pay | Admitting: *Deleted

## 2015-05-17 ENCOUNTER — Encounter (HOSPITAL_COMMUNITY): Payer: Self-pay | Admitting: Emergency Medicine

## 2015-05-17 ENCOUNTER — Emergency Department (HOSPITAL_COMMUNITY)
Admission: EM | Admit: 2015-05-17 | Discharge: 2015-05-17 | Disposition: A | Discharge status: Home / Self Care | Payer: Commercial Managed Care - HMO | Attending: Emergency Medicine | Admitting: Emergency Medicine

## 2015-05-17 DIAGNOSIS — Z79899 Other long term (current) drug therapy: Secondary | ICD-10-CM | POA: Insufficient documentation

## 2015-05-17 DIAGNOSIS — N202 Calculus of kidney with calculus of ureter: Secondary | ICD-10-CM | POA: Diagnosis not present

## 2015-05-17 DIAGNOSIS — Z87440 Personal history of urinary (tract) infections: Secondary | ICD-10-CM | POA: Insufficient documentation

## 2015-05-17 DIAGNOSIS — Z8619 Personal history of other infectious and parasitic diseases: Secondary | ICD-10-CM | POA: Diagnosis not present

## 2015-05-17 DIAGNOSIS — Z8719 Personal history of other diseases of the digestive system: Secondary | ICD-10-CM | POA: Insufficient documentation

## 2015-05-17 DIAGNOSIS — R197 Diarrhea, unspecified: Secondary | ICD-10-CM | POA: Diagnosis not present

## 2015-05-17 DIAGNOSIS — Z862 Personal history of diseases of the blood and blood-forming organs and certain disorders involving the immune mechanism: Secondary | ICD-10-CM | POA: Insufficient documentation

## 2015-05-17 DIAGNOSIS — Z9889 Other specified postprocedural states: Secondary | ICD-10-CM | POA: Diagnosis not present

## 2015-05-17 DIAGNOSIS — N200 Calculus of kidney: Secondary | ICD-10-CM

## 2015-05-17 DIAGNOSIS — Z86018 Personal history of other benign neoplasm: Secondary | ICD-10-CM | POA: Diagnosis not present

## 2015-05-17 DIAGNOSIS — Z872 Personal history of diseases of the skin and subcutaneous tissue: Secondary | ICD-10-CM | POA: Diagnosis not present

## 2015-05-17 DIAGNOSIS — Z8739 Personal history of other diseases of the musculoskeletal system and connective tissue: Secondary | ICD-10-CM | POA: Insufficient documentation

## 2015-05-17 DIAGNOSIS — Z8742 Personal history of other diseases of the female genital tract: Secondary | ICD-10-CM | POA: Diagnosis not present

## 2015-05-17 DIAGNOSIS — N201 Calculus of ureter: Secondary | ICD-10-CM

## 2015-05-17 DIAGNOSIS — R109 Unspecified abdominal pain: Secondary | ICD-10-CM | POA: Diagnosis present

## 2015-05-17 LAB — URINE MICROSCOPIC-ADD ON
BACTERIA UA: NONE SEEN
WBC, UA: NONE SEEN WBC/hpf (ref 0–5)

## 2015-05-17 LAB — URINALYSIS, ROUTINE W REFLEX MICROSCOPIC
Bilirubin Urine: NEGATIVE
GLUCOSE, UA: NEGATIVE mg/dL
Ketones, ur: NEGATIVE mg/dL
LEUKOCYTES UA: NEGATIVE
Nitrite: NEGATIVE
Protein, ur: NEGATIVE mg/dL
Specific Gravity, Urine: >1.03 — ABNORMAL HIGH (ref 1.005–1.030)
pH: 5 (ref 5.0–8.0)

## 2015-05-17 MED ORDER — OXYCODONE-ACETAMINOPHEN 5-325 MG PO TABS: 1.0000 | ORAL_TABLET | ORAL | Status: DC | PRN

## 2015-05-17 MED ORDER — IBUPROFEN 800 MG PO TABS: 800.0000 mg | ORAL_TABLET | Freq: Three times a day (TID) | ORAL | Status: DC

## 2015-05-17 MED ORDER — HYDROMORPHONE HCL 1 MG/ML IJ SOLN
1.0000 mg | INTRAMUSCULAR | Status: AC
  Administered 2015-05-17: 1 mg via INTRAMUSCULAR
  Filled 2015-05-17: qty 1

## 2015-05-17 MED ORDER — TAMSULOSIN HCL 0.4 MG PO CAPS: 0.4000 mg | ORAL_CAPSULE | Freq: Two times a day (BID) | ORAL | Status: DC

## 2015-05-17 MED ORDER — ONDANSETRON 4 MG PO TBDP: 4.0000 mg | ORAL_TABLET | Freq: Three times a day (TID) | ORAL | Status: DC | PRN

## 2015-05-17 MED ORDER — KETOROLAC TROMETHAMINE 60 MG/2ML IM SOLN
60.0000 mg | Freq: Once | INTRAMUSCULAR | Status: AC
  Administered 2015-05-17: 60 mg via INTRAMUSCULAR
  Filled 2015-05-17: qty 2

## 2015-05-17 NOTE — ED Notes (Signed)
Pt made aware to return if symptoms worsen or if any life threatening symptoms occur.  Pt given urine to strain all urine.

## 2015-05-17 NOTE — ED Notes (Signed)
Patient with right flank pain that started this morning. H/o kidney stone

## 2015-05-17 NOTE — Discharge Instructions (Signed)

## 2015-05-17 NOTE — ED Provider Notes (Signed)
CSN: [id number]     Arrival date & time 05/17/15  [id number] History  By signing my name below, I, Jolayne Panther, attest that this documentation has been prepared under the direction and in the presence of Noemi Chapel, MD. Electronically Signed: Jolayne Panther, Scribe. 05/17/2015. 8:11 AM.     Chief Complaint  Patient presents with  . Flank Pain   The history is provided by the patient. No language interpreter was used.  HPI Comments: Ariana Long is a 60 y.o. female who presents to the Emergency Department complaining of right sided flank pain with radiation into her RLQ and right lower back onset this morning when she woke up. Pt also reports associated vomiting and diarrhea. She states that her pain feels similar to labor pains. Pt notes that she has not taken any medication for relief of her symptoms. She denies dysuria and hx of abdominal surgery, oophorectomy, appendectomy and cholecystectomy.   Past Medical History  Diagnosis Date  . Osteoporosis   . Hidradenitis   . Thrombocytopenia ([place]) 01/26/2011  . Hepatitis C 01/26/2011    2005 non responder, genotype 1, Gr 1-2, Stage 3 . Treated with Harvoni at the Liver clinic, responder  . Splenomegaly 01/26/2011  . Cirrhosis of liver ([place]) 01/26/2011    FIBROSIS on liver biopsy in 2007, U/S  03/31/2013= mild diffuse hepatic steatosis and /or hepatocellular disease without focal hepatic parenchymal abnormality. per Dr. Patsy Baltimore (2012) pt is immune to hep A and Hep B. per 12/06/13 note from Liver clinic-pt had MRI which showed cirrhosis 2015.  Marland Kitchen Elevated AFP 01/26/2011  . Kidney stones 10/2011  . Hx: UTI (urinary tract infection)   . Serrated adenoma of colon 11/2011  . Hemorrhoids   . Hemorrhoid 12/01/2012  . Tubular adenoma   . Vulval lesion 12/14/2013    Has kissing lesions thin and puffy and paler i color about 5-6- mm in diameter.Dr Glo Herring in to co examine will try temovate and recheck in 3 months  . Diverticulitis of colon   .  Hematuria 12/12/2014   Past Surgical History  Procedure Laterality Date  . Cesarean section      x 2  . Axillary gland removal  1979    bilateral  . Colonoscopy  07/31/2005    Rourk-Normal rectum/Normal colonoscopy/Repeat screening colonoscopy ten years  . Breast biopsy  2011    Right  . Colonoscopy  11/16/2011    RMR: Friable anorectum-likely source of hematochezia/ LARGE 1.2x2.5CM SERRATED ADENOMA resected from ascending colon 7CM DISTAL TO ICV, hyperplastic polyp removed   . Colonoscopy N/A 07/18/2012    Dr. Gala Romney- normal appearing rectal mucosa. no residual polpy tissue seen at tatoo location. remainder of colonic mucosa appeared entirely normal. bx = tubular adenoma  . Hemorrhoid banding    . Skin lesion excision     Family History  Problem Relation Age of Onset  . Heart attack Mother   . Stroke Mother   . Aneurysm Father    Social History  Substance Use Topics  . Smoking status: Never Smoker   . Smokeless tobacco: Never Used  . Alcohol Use: No   OB History    Gravida Para Term Preterm AB TAB SAB Ectopic Multiple Living   3 3  1      2      Review of Systems  Gastrointestinal: Positive for vomiting, abdominal pain and diarrhea.  Genitourinary: Positive for flank pain.  Musculoskeletal: Positive for back pain.  All other systems reviewed  and are negative.  Allergies  Review of patient's allergies indicates no known allergies.  Home Medications   Prior to Admission medications   Medication Sig Start Date End Date Taking? Authorizing Provider  Cholecalciferol (VITAMIN D-3 PO) Take 4,000 Units by mouth daily.   Yes Historical Provider, MD  Cyanocobalamin (VITAMIN B-12 PO) Take 1 tablet by mouth daily.   Yes Historical Provider, MD  magnesium oxide (MAG-OX) 400 MG tablet Take 400 mg by mouth daily.    Yes Historical Provider, MD  Multiple Vitamins-Minerals (COMPLETE MULTIVITAMIN/MINERAL) LIQD Take 1 scoop by mouth daily.   Yes Historical Provider, MD  Omega 3-6-9  Fatty Acids CAPS Take 1 capsule by mouth daily.    Yes Historical Provider, MD  vitamin E 400 UNIT capsule Take 400 Units by mouth daily.   Yes Historical Provider, MD  clobetasol ointment (TEMOVATE) 0.05 % Use 2 x daily x 2 weeks then 2-3 x weekly thereafter to affected area Patient not taking: Reported on 05/17/2015 12/14/13   Estill Dooms, NP  ibuprofen (ADVIL,MOTRIN) 800 MG tablet Take 1 tablet (800 mg total) by mouth 3 (three) times daily. 05/17/15   Noemi Chapel, MD  ondansetron (ZOFRAN ODT) 4 MG disintegrating tablet Take 1 tablet (4 mg total) by mouth every 8 (eight) hours as needed for nausea. 05/17/15   Noemi Chapel, MD  oxyCODONE-acetaminophen (PERCOCET) 5-325 MG tablet Take 1 tablet by mouth every 4 (four) hours as needed. 05/17/15   Noemi Chapel, MD  tamsulosin (FLOMAX) 0.4 MG CAPS capsule Take 1 capsule (0.4 mg total) by mouth 2 (two) times daily. 05/17/15   Noemi Chapel, MD   BP 130/67 mmHg  Pulse 80  Temp(Src) 97.7 F (36.5 C) (Oral)  Resp 19  Ht 5\' 6"  (1.676 m)  Wt 159 lb (72.122 kg)  BMI 25.68 kg/m2  SpO2 98% Physical Exam  Constitutional: She appears well-developed and well-nourished. No distress.  HENT:  Head: Normocephalic and atraumatic.  Mouth/Throat: Oropharynx is clear and moist. No oropharyngeal exudate.  Eyes: Conjunctivae and EOM are normal. Pupils are equal, round, and reactive to light. Right eye exhibits no discharge. Left eye exhibits no discharge. No scleral icterus.  Neck: Normal range of motion. Neck supple. No JVD present. No thyromegaly present.  Cardiovascular: Normal rate, regular rhythm, normal heart sounds and intact distal pulses.  Exam reveals no gallop and no friction rub.   No murmur heard. Pulmonary/Chest: Effort normal and breath sounds normal. No respiratory distress. She has no wheezes. She has no rales.  Abdominal: Soft. Bowel sounds are normal. She exhibits no distension and no mass. There is no tenderness.  Soft non tender No  tenderness over the right flank Appears colicky  Musculoskeletal: Normal range of motion. She exhibits no edema or tenderness.  Lymphadenopathy:    She has no cervical adenopathy.  Neurological: She is alert. Coordination normal.  Skin: Skin is warm and dry. No rash noted. No erythema.  Psychiatric: She has a normal mood and affect. Her behavior is normal.  Nursing note and vitals reviewed.   ED Course  Procedures  DIAGNOSTIC STUDIES:    Oxygen Saturation is 98% on RA, normal by my interpretation.   COORDINATION OF CARE:  8:02 AM Will perform ultra sound in the ED. Will prescribe pt flomax. Discussed treatment plan with pt at bedside and pt agreed to plan.   Labs Review Labs Reviewed  URINALYSIS, ROUTINE W REFLEX MICROSCOPIC (NOT AT [facility]) - Abnormal; Notable for the following:  Specific Gravity, Urine >1.030 (*)    Hgb urine dipstick TRACE (*)    All other components within normal limits  URINE MICROSCOPIC-ADD ON - Abnormal; Notable for the following:    Squamous Epithelial / LPF 6-30 (*)    All other components within normal limits   Imaging Review No results found. I have personally reviewed and evaluated these images and lab results as part of my medical decision-making.   MDM   Final diagnoses:  Kidney stone  Ureterolithiasis   EMERGENCY DEPARTMENT US RENAL EXAM  "Study: Limited Retroperitoneal Ultrasound of Kidneys"  INDICATIONS: Flank pain  Long and short axis of both kidneys were obtained.   PERFORMED BY: Myself  IMAGES ARCHIVED?: Yes  LIMITATIONS: Body habitus  VIEWS USED: Long axis and Short axis   INTERPRETATION: Right Hydronephrosis mild, otherwise other veiws normal   CPT Code: [phone] (limited retroperitoneal)  Pain meds ordered R/o UTI Pt stable - has had w/u with confirmed stones in the past - reviewed EMR, CT showing small distal ureteral stones.  The pt initially declined pain meds and then agreed prior to d/c. UA clean - no  infection. Hydro suggests small stone.  Meds given in ED:  Medications  HYDROmorphone (DILAUDID) injection 1 mg (1 mg Intramuscular Given 05/17/15 0850)  ketorolac (TORADOL) injection 60 mg (60 mg Intramuscular Given 05/17/15 0849)    New Prescriptions   IBUPROFEN (ADVIL,MOTRIN) 800 MG TABLET    Take 1 tablet (800 mg total) by mouth 3 (three) times daily.   ONDANSETRON (ZOFRAN ODT) 4 MG DISINTEGRATING TABLET    Take 1 tablet (4 mg total) by mouth every 8 (eight) hours as needed for nausea.   OXYCODONE-ACETAMINOPHEN (PERCOCET) 5-325 MG TABLET    Take 1 tablet by mouth every 4 (four) hours as needed.   TAMSULOSIN (FLOMAX) 0.4 MG CAPS CAPSULE    Take 1 capsule (0.4 mg total) by mouth 2 (two) times daily.    I personally performed the services described in this documentation, which was scribed in my presence. The recorded information has been reviewed and is accurate.       Noemi Chapel, MD 05/17/15 [phone]

## 2015-05-17 NOTE — ED Notes (Signed)
Pt not wanting medication at this time. Pt in no pain.

## 2015-05-31 ENCOUNTER — Emergency Department (HOSPITAL_COMMUNITY)
Admission: EM | Admit: 2015-05-31 | Discharge: 2015-05-31 | Disposition: A | Discharge status: Home / Self Care | Payer: Commercial Managed Care - HMO | Attending: Emergency Medicine | Admitting: Emergency Medicine

## 2015-05-31 ENCOUNTER — Encounter (HOSPITAL_COMMUNITY): Payer: Self-pay

## 2015-05-31 DIAGNOSIS — M810 Age-related osteoporosis without current pathological fracture: Secondary | ICD-10-CM | POA: Diagnosis not present

## 2015-05-31 DIAGNOSIS — N200 Calculus of kidney: Secondary | ICD-10-CM | POA: Diagnosis not present

## 2015-05-31 DIAGNOSIS — Z791 Long term (current) use of non-steroidal anti-inflammatories (NSAID): Secondary | ICD-10-CM | POA: Insufficient documentation

## 2015-05-31 DIAGNOSIS — Z87440 Personal history of urinary (tract) infections: Secondary | ICD-10-CM | POA: Insufficient documentation

## 2015-05-31 DIAGNOSIS — Z872 Personal history of diseases of the skin and subcutaneous tissue: Secondary | ICD-10-CM | POA: Insufficient documentation

## 2015-05-31 DIAGNOSIS — Z8619 Personal history of other infectious and parasitic diseases: Secondary | ICD-10-CM | POA: Insufficient documentation

## 2015-05-31 DIAGNOSIS — Z8742 Personal history of other diseases of the female genital tract: Secondary | ICD-10-CM | POA: Insufficient documentation

## 2015-05-31 DIAGNOSIS — Z862 Personal history of diseases of the blood and blood-forming organs and certain disorders involving the immune mechanism: Secondary | ICD-10-CM | POA: Diagnosis not present

## 2015-05-31 DIAGNOSIS — Z86018 Personal history of other benign neoplasm: Secondary | ICD-10-CM | POA: Diagnosis not present

## 2015-05-31 DIAGNOSIS — Z79899 Other long term (current) drug therapy: Secondary | ICD-10-CM | POA: Insufficient documentation

## 2015-05-31 DIAGNOSIS — R109 Unspecified abdominal pain: Secondary | ICD-10-CM | POA: Diagnosis present

## 2015-05-31 DIAGNOSIS — Z8719 Personal history of other diseases of the digestive system: Secondary | ICD-10-CM | POA: Diagnosis not present

## 2015-05-31 LAB — URINE MICROSCOPIC-ADD ON: SQUAMOUS EPITHELIAL / LPF: NONE SEEN

## 2015-05-31 LAB — I-STAT CHEM 8, ED
BUN: 16 mg/dL (ref 6–20)
Calcium, Ion: 1.23 mmol/L (ref 1.13–1.30)
Chloride: 104 mmol/L (ref 101–111)
Creatinine, Ser: 0.8 mg/dL (ref 0.44–1.00)
Glucose, Bld: 103 mg/dL — ABNORMAL HIGH (ref 65–99)
HEMATOCRIT: 43 % (ref 36.0–46.0)
HEMOGLOBIN: 14.6 g/dL (ref 12.0–15.0)
POTASSIUM: 3.3 mmol/L — AB (ref 3.5–5.1)
SODIUM: 143 mmol/L (ref 135–145)
TCO2: 23 mmol/L (ref 0–100)

## 2015-05-31 LAB — URINALYSIS, ROUTINE W REFLEX MICROSCOPIC
BILIRUBIN URINE: NEGATIVE
Glucose, UA: NEGATIVE mg/dL
KETONES UR: NEGATIVE mg/dL
NITRITE: NEGATIVE
PROTEIN: 30 mg/dL — AB
pH: 5 (ref 5.0–8.0)

## 2015-05-31 MED ORDER — CEPHALEXIN 500 MG PO CAPS: 500.0000 mg | ORAL_CAPSULE | Freq: Four times a day (QID) | ORAL | Status: DC

## 2015-05-31 MED ORDER — OXYCODONE HCL 5 MG PO TABS: 5.0000 mg | ORAL_TABLET | Freq: Four times a day (QID) | ORAL | Status: DC | PRN

## 2015-05-31 MED ORDER — SODIUM CHLORIDE 0.9 % IV BOLUS (SEPSIS)
1000.0000 mL | Freq: Once | INTRAVENOUS | Status: AC
  Administered 2015-05-31: 1000 mL via INTRAVENOUS

## 2015-05-31 MED ORDER — DEXTROSE 5 % IV SOLN
1.0000 g | Freq: Once | INTRAVENOUS | Status: AC
  Administered 2015-05-31: 1 g via INTRAVENOUS
  Filled 2015-05-31: qty 10

## 2015-05-31 MED ORDER — KETOROLAC TROMETHAMINE 30 MG/ML IJ SOLN
30.0000 mg | Freq: Once | INTRAMUSCULAR | Status: AC
  Administered 2015-05-31: 30 mg via INTRAVENOUS
  Filled 2015-05-31: qty 1

## 2015-05-31 MED ORDER — OXYCODONE-ACETAMINOPHEN 5-325 MG PO TABS: 1.0000 | ORAL_TABLET | Freq: Four times a day (QID) | ORAL | Status: DC | PRN

## 2015-05-31 MED ORDER — MORPHINE SULFATE (PF) 4 MG/ML IV SOLN
4.0000 mg | Freq: Once | INTRAVENOUS | Status: AC
  Administered 2015-05-31: 4 mg via INTRAVENOUS
  Filled 2015-05-31: qty 1

## 2015-05-31 MED ORDER — IBUPROFEN 800 MG PO TABS: 800.0000 mg | ORAL_TABLET | Freq: Three times a day (TID) | ORAL | Status: DC | PRN

## 2015-05-31 MED ORDER — ONDANSETRON 4 MG PO TBDP: 4.0000 mg | ORAL_TABLET | Freq: Three times a day (TID) | ORAL | Status: DC | PRN

## 2015-05-31 MED ORDER — ONDANSETRON HCL 4 MG/2ML IJ SOLN
4.0000 mg | Freq: Once | INTRAMUSCULAR | Status: AC
  Administered 2015-05-31: 4 mg via INTRAVENOUS
  Filled 2015-05-31: qty 2

## 2015-05-31 MED ORDER — POTASSIUM CHLORIDE CRYS ER 20 MEQ PO TBCR
40.0000 meq | EXTENDED_RELEASE_TABLET | Freq: Once | ORAL | Status: AC
  Administered 2015-05-31: 40 meq via ORAL
  Filled 2015-05-31: qty 2

## 2015-05-31 MED ORDER — TAMSULOSIN HCL 0.4 MG PO CAPS: 0.4000 mg | ORAL_CAPSULE | Freq: Every day | ORAL | Status: DC

## 2015-05-31 NOTE — Discharge Instructions (Signed)
You have blood in your urine which is typical with a kidney stone. There is a trace amount of white blood cells and many bacteria. Because of this we are treating as if he may have a urinary tract infection as well. Ultrasound of your kidney. Normal. Your kidney function and your blood work is also normal. I recommended she drink plenty of water and follow-up with your urologist. If you develop worsening pain, vomiting that will not stop, fever, or unable to urinate, please return to the hospital.   Dietary Guidelines to Help Prevent Kidney Stones Your risk of kidney stones can be decreased by adjusting the foods you eat. The most important thing you can do is drink enough fluid. You should drink enough fluid to keep your urine clear or pale yellow. The following guidelines provide specific information for the type of kidney stone you have had. GUIDELINES ACCORDING TO TYPE OF KIDNEY STONE Calcium Oxalate Kidney Stones  Reduce the amount of salt you eat. Foods that have a lot of salt cause your body to release excess calcium into your urine. The excess calcium can combine with a substance called oxalate to form kidney stones.  Reduce the amount of animal protein you eat if the amount you eat is excessive. Animal protein causes your body to release excess calcium into your urine. Ask your dietitian how much protein from animal sources you should be eating.  Avoid foods that are high in oxalates. If you take vitamins, they should have less than 500 mg of vitamin C. Your body turns vitamin C into oxalates. You do not need to avoid fruits and vegetables high in vitamin C. Calcium Phosphate Kidney Stones  Reduce the amount of salt you eat to help prevent the release of excess calcium into your urine.  Reduce the amount of animal protein you eat if the amount you eat is excessive. Animal protein causes your body to release excess calcium into your urine. Ask your dietitian how much protein from animal  sources you should be eating.  Get enough calcium from food or take a calcium supplement (ask your dietitian for recommendations). Food sources of calcium that do not increase your risk of kidney stones include:  Broccoli.  Dairy products, such as cheese and yogurt.  Pudding. Uric Acid Kidney Stones  Do not have more than 6 oz of animal protein per day. FOOD SOURCES Animal Protein Sources  Meat (all types).  Poultry.  Eggs.  Fish, seafood. Foods High in [employer] seasonings.  Soy sauce.  Teriyaki sauce.  Cured and processed meats.  Salted crackers and snack foods.  Fast food.  Canned soups and most canned foods. Foods High in Oxalates  Grains:  Amaranth.  Barley.  Grits.  Wheat germ.  Bran.  Buckwheat flour.  All bran cereals.  Pretzels.  Whole wheat bread.  Vegetables:  Beans (wax).  Beets and beet greens.  Collard greens.  Eggplant.  [place].  Leeks.  Okra.  Parsley.  Rutabagas.  Spinach.  Swiss chard.  Tomato paste.  Fried potatoes.  Sweet potatoes.  Fruits:  Red currants.  Figs.  Kiwi.  Rhubarb.  Meat and Other Protein Sources:  Beans (dried).  Soy burgers and other soybean products.  Miso.  Nuts (peanuts, almonds, pecans, cashews, hazelnuts).  Nut butters.  Sesame seeds and tahini (paste made of sesame seeds).  Poppy seeds.  Beverages:  Chocolate drink mixes.  Soy milk.  Instant iced tea.  Juices made from high-oxalate fruits or vegetables.  Other:  Carob.  Chocolate.  Fruitcake.  Marmalades.   This information is not intended to replace advice given to you by your health care provider. Make sure you discuss any questions you have with your health care provider.   Document Released: 08/15/2010 Document Revised: 04/25/2013 Document Reviewed: 03/17/2013 Elsevier Interactive Patient Education 2016 Elsevier Inc.  Kidney Stones Kidney stones (urolithiasis) are deposits that  form inside your kidneys. The intense pain is caused by the stone moving through the urinary tract. When the stone moves, the ureter goes into spasm around the stone. The stone is usually passed in the urine.  CAUSES   A disorder that makes certain neck glands produce too much parathyroid hormone (primary hyperparathyroidism).  A buildup of uric acid crystals, similar to gout in your joints.  Narrowing (stricture) of the ureter.  A kidney obstruction present at birth (congenital obstruction).  Previous surgery on the kidney or ureters.  Numerous kidney infections. SYMPTOMS   Feeling sick to your stomach (nauseous).  Throwing up (vomiting).  Blood in the urine (hematuria).  Pain that usually spreads (radiates) to the groin.  Frequency or urgency of urination. DIAGNOSIS   Taking a history and physical exam.  Blood or urine tests.  CT scan.  Occasionally, an examination of the inside of the urinary bladder (cystoscopy) is performed. TREATMENT   Observation.  Increasing your fluid intake.  Extracorporeal shock wave lithotripsy--This is a noninvasive procedure that uses shock waves to break up kidney stones.  Surgery may be needed if you have severe pain or persistent obstruction. There are various surgical procedures. Most of the procedures are performed with the use of small instruments. Only small incisions are needed to accommodate these instruments, so recovery time is minimized. The size, location, and chemical composition are all important variables that will determine the proper choice of action for you. Talk to your health care provider to better understand your situation so that you will minimize the risk of injury to yourself and your kidney.  HOME CARE INSTRUCTIONS   Drink enough water and fluids to keep your urine clear or pale yellow. This will help you to pass the stone or stone fragments.  Strain all urine through the provided strainer. Keep all particulate  matter and stones for your health care provider to see. The stone causing the pain may be as small as a grain of salt. It is very important to use the strainer each and every time you pass your urine. The collection of your stone will allow your health care provider to analyze it and verify that a stone has actually passed. The stone analysis will often identify what you can do to reduce the incidence of recurrences.  Only take over-the-counter or prescription medicines for pain, discomfort, or fever as directed by your health care provider.  Keep all follow-up visits as told by your health care provider. This is important.  Get follow-up X-rays if required. The absence of pain does not always mean that the stone has passed. It may have only stopped moving. If the urine remains completely obstructed, it can cause loss of kidney function or even complete destruction of the kidney. It is your responsibility to make sure X-rays and follow-ups are completed. Ultrasounds of the kidney can show blockages and the status of the kidney. Ultrasounds are not associated with any radiation and can be performed easily in a matter of minutes.  Make changes to your daily diet as told by your health care provider. You  may be told to:  Limit the amount of salt that you eat.  Eat 5 or more servings of fruits and vegetables each day.  Limit the amount of meat, poultry, fish, and eggs that you eat.  Collect a 24-hour urine sample as told by your health care provider.You may need to collect another urine sample every 6-12 months. SEEK MEDICAL CARE IF:  You experience pain that is progressive and unresponsive to any pain medicine you have been prescribed. SEEK IMMEDIATE MEDICAL CARE IF:   Pain cannot be controlled with the prescribed medicine.  You have a fever or shaking chills.  The severity or intensity of pain increases over 18 hours and is not relieved by pain medicine.  You develop a new onset of  abdominal pain.  You feel faint or pass out.  You are unable to urinate.   This information is not intended to replace advice given to you by your health care provider. Make sure you discuss any questions you have with your health care provider.   Document Released: 04/20/2005 Document Revised: 01/09/2015 Document Reviewed: 09/21/2012 Elsevier Interactive Patient Education [employer].

## 2015-05-31 NOTE — ED Notes (Signed)
Pt made aware to return if symptoms worsen or if any life threatening symptoms occur.   

## 2015-05-31 NOTE — ED Notes (Signed)
Pt reports right flank pain onset approx 0100 this am, states she had just passed a stone 3 weeks ago

## 2015-05-31 NOTE — ED Provider Notes (Signed)
TIME SEEN: 6:10 AM  CHIEF COMPLAINT: Right-sided flank pain  HPI: Pt is a 60 y.o. female with history of hepatitis C, liver cirrhosis, kidney stones who presents emergency department with right flank pain that started early this morning around 1 AM. Feels exactly like prior kidney stones. As sharp it radiated to the right lower abdomen. No dysuria but has noticed hematuria. No urinary frequency or urgency. Has had nausea but no vomiting or diarrhea. No fever. Had similar symptoms 10 days ago and states she used a urine strainer and saw that she passed a stone and her pain resolved. Denies vaginal bleeding or discharge. Last period was years ago. No prior history of abdominal surgery. Has never had to have surgery to remove kidney stones. Is followed by [facility].  ROS: See HPI Constitutional: no fever  Eyes: no drainage  ENT: no runny nose   Cardiovascular:  no chest pain  Resp: no SOB  GI: no vomiting GU: no dysuria Integumentary: no rash  Allergy: no hives  Musculoskeletal: no leg swelling  Neurological: no slurred speech ROS otherwise negative  PAST MEDICAL HISTORY/PAST SURGICAL HISTORY:  Past Medical History  Diagnosis Date  . Osteoporosis   . Hidradenitis   . Thrombocytopenia ([place]) 01/26/2011  . Hepatitis C 01/26/2011    2005 non responder, genotype 1, Gr 1-2, Stage 3 . Treated with Harvoni at the Liver clinic, responder  . Splenomegaly 01/26/2011  . Cirrhosis of liver ([place]) 01/26/2011    FIBROSIS on liver biopsy in 2007, U/S  03/31/2013= mild diffuse hepatic steatosis and /or hepatocellular disease without focal hepatic parenchymal abnormality. per Dr. Patsy Baltimore (2012) pt is immune to hep A and Hep B. per 12/06/13 note from Liver clinic-pt had MRI which showed cirrhosis 2015.  Marland Kitchen Elevated AFP 01/26/2011  . Kidney stones 10/2011  . Hx: UTI (urinary tract infection)   . Serrated adenoma of colon 11/2011  . Hemorrhoids   . Hemorrhoid 12/01/2012  . Tubular adenoma   . Vulval lesion  12/14/2013    Has kissing lesions thin and puffy and paler i color about 5-6- mm in diameter.Dr Glo Herring in to co examine will try temovate and recheck in 3 months  . Diverticulitis of colon   . Hematuria 12/12/2014    MEDICATIONS:  Prior to Admission medications   Medication Sig Start Date End Date Taking? Authorizing Provider  Cholecalciferol (VITAMIN D-3 PO) Take 4,000 Units by mouth daily.    Historical Provider, MD  clobetasol ointment (TEMOVATE) 0.05 % Use 2 x daily x 2 weeks then 2-3 x weekly thereafter to affected area Patient not taking: Reported on 05/17/2015 12/14/13   Estill Dooms, NP  Cyanocobalamin (VITAMIN B-12 PO) Take 1 tablet by mouth daily.    Historical Provider, MD  ibuprofen (ADVIL,MOTRIN) 800 MG tablet Take 1 tablet (800 mg total) by mouth 3 (three) times daily. 05/17/15   Noemi Chapel, MD  magnesium oxide (MAG-OX) 400 MG tablet Take 400 mg by mouth daily.     Historical Provider, MD  Multiple Vitamins-Minerals (COMPLETE MULTIVITAMIN/MINERAL) LIQD Take 1 scoop by mouth daily.    Historical Provider, MD  Omega 3-6-9 Fatty Acids CAPS Take 1 capsule by mouth daily.     Historical Provider, MD  ondansetron (ZOFRAN ODT) 4 MG disintegrating tablet Take 1 tablet (4 mg total) by mouth every 8 (eight) hours as needed for nausea. 05/17/15   Noemi Chapel, MD  oxyCODONE-acetaminophen (PERCOCET) 5-325 MG tablet Take 1 tablet by mouth every 4 (four) hours  as needed. 05/17/15   Noemi Chapel, MD  tamsulosin (FLOMAX) 0.4 MG CAPS capsule Take 1 capsule (0.4 mg total) by mouth 2 (two) times daily. 05/17/15   Noemi Chapel, MD  vitamin E 400 UNIT capsule Take 400 Units by mouth daily.    Historical Provider, MD    ALLERGIES:  No Known Allergies  SOCIAL HISTORY:  Social History  Substance Use Topics  . Smoking status: Never Smoker   . Smokeless tobacco: Never Used  . Alcohol Use: No    FAMILY HISTORY: Family History  Problem Relation Age of Onset  . Heart attack Mother   . Stroke  Mother   . Aneurysm Father     EXAM: BP 139/79 mmHg  Pulse 89  Temp(Src) 97.9 F (36.6 C) (Oral)  Resp 20  Ht 5\' 5"  (1.651 m)  Wt 159 lb (72.122 kg)  BMI 26.46 kg/m2  SpO2 98% CONSTITUTIONAL: Alert and oriented and responds appropriately to questions. Appears uncomfortable but is afebrile and nontoxic HEAD: Normocephalic EYES: Conjunctivae clear, PERRL ENT: normal nose; no rhinorrhea; moist mucous membranes; pharynx without lesions noted NECK: Supple, no meningismus, no LAD  CARD: RRR; S1 and S2 appreciated; no murmurs, no clicks, no rubs, no gallops RESP: Normal chest excursion without splinting or tachypnea; breath sounds clear and equal bilaterally; no wheezes, no rhonchi, no rales, no hypoxia or respiratory distress, speaking full sentences ABD/GI: Normal bowel sounds; non-distended; soft, non-tender, no rebound, no guarding, no peritoneal signs BACK:  The back appears normal and is non-tender to palpation, there is no CVA tenderness EXT: Normal ROM in all joints; non-tender to palpation; no edema; normal capillary refill; no cyanosis, no calf tenderness or swelling    SKIN: Normal color for age and race; warm NEURO: Moves all extremities equally, sensation to light touch intact diffusely, cranial nerves II through XII intact PSYCH: The patient's mood and manner are appropriate. Grooming and personal hygiene are appropriate.  MEDICAL DECISION MAKING: Patient here with right flank pain radiating to right lower abdomen. Exam is benign. Hemodynamically stable, nontoxic. We'll obtain labs, urine. Differential diagnosis includes kidney stone, pyelonephritis. Doubt cholecystitis, cholelithiasis, appendicitis, pancreatitis based on benign abdominal exam. IV fluids, Zofran, Toradol, morphine.  ED PROGRESS: Patient's labs show potassium of 3.3. Have given oral potassium. Creatinine is normal at 0.8. Urine shows large hemoglobin, trace leukocytes with 0-5 WBCs and many bacteria. Urine culture  is pending. She is not having dysuria. She reports her pain is markedly improved. She appears uncomfortable on exam. Bedside ultrasound reveals no hydronephrosis. She has had many CT scans in the past. I do not feel she warrants another CT scan today. I will give her dose of IV ceftriaxone given her questionable urine and discharge her on Keflex 4 times a day for 1 week. Have advised her to follow-up with urology today. We'll discharge with Percocet, ibuprofen, Zofran and Flomax. Discussed return precautions. She verbalized understanding and is comfortable with this plan.   Emergency Focused Ultrasound Exam Limited retroperitoneal ultrasound of kidneys  Performed and interpreted by Dr. Leonides Schanz Indication: flank pain Focused abdominal ultrasound with both kidneys imaged in transverse and longitudinal planes in real-time. Interpretation: No significant hydronephrosis visualized.   Images archived electronically  CPT Code: [phone] (limited retroperitoneal)   [place], DO 05/31/15 [phone]

## 2015-06-02 LAB — URINE CULTURE

## 2015-06-18 ENCOUNTER — Encounter: Payer: Self-pay | Admitting: Internal Medicine

## 2015-06-20 ENCOUNTER — Encounter: Payer: Self-pay | Admitting: Internal Medicine

## 2015-08-13 ENCOUNTER — Ambulatory Visit (INDEPENDENT_AMBULATORY_CARE_PROVIDER_SITE_OTHER): Payer: Commercial Managed Care - HMO | Admitting: Nurse Practitioner

## 2015-08-13 ENCOUNTER — Encounter: Payer: Self-pay | Admitting: Nurse Practitioner

## 2015-08-13 ENCOUNTER — Other Ambulatory Visit: Payer: Self-pay

## 2015-08-13 VITALS — BP 122/82 | HR 77 | Temp 97.7°F | Ht 66.0 in | Wt 162.0 lb

## 2015-08-13 DIAGNOSIS — Z860101 Personal history of adenomatous and serrated colon polyps: Secondary | ICD-10-CM | POA: Insufficient documentation

## 2015-08-13 DIAGNOSIS — Z86010 Personal history of colonic polyps: Secondary | ICD-10-CM

## 2015-08-13 DIAGNOSIS — K7469 Other cirrhosis of liver: Secondary | ICD-10-CM

## 2015-08-13 MED ORDER — PEG-KCL-NACL-NASULF-NA ASC-C 100 G PO SOLR: 1.0000 | ORAL | Status: DC

## 2015-08-13 NOTE — Assessment & Plan Note (Signed)
Hepatitis C related cirrhosis status post Harvoni treatment and sustained viral response. Is due for screening endoscopy for varices currently. Receives routine cirrhosis care at [facility] hepatology clinic in [place]. At this point we'll proceed with endoscopy and possible banding if needed.  Proceed with EGD with possible variceal banding with Dr. Gala Romney in near future: the risks, benefits, and alternatives have been discussed with the patient in detail. The patient states understanding and desires to proceed.  Patient is not on any anticoagulants, anxiolytics, chronic pain medications, or antidepressants. Conscious sedation previously effective for her last procedure and should be this time as well.

## 2015-08-13 NOTE — Progress Notes (Signed)
Referring Provider: Sharilyn Sites, MD Primary Care Physician:  Purvis Kilts, MD Primary GI:  Dr. Gala Romney  Chief Complaint  Patient presents with  . set up TCS/EGD    HPI:   Ariana Long is a 60 y.o. female who presents to schedule colonoscopy and endoscopy. She was last seen in our office 01/22/2015 for cirrhosis due to hepatitis see status post treatment with Harvoni and sustained viral response. She is followed by [facility] who follows her hepatoma screening. She was referred to [place] for consideration of endoscopy for esophageal variceal screening. This procedure was postponed until 2017 due to her needing a repeat colonoscopy at that time in order to accomplish procedures at same time. At the time of her last visit she noted continuing rectal bleeding which she attributed to hemorrhoids with hemorrhoid banding 1 completed but no further banding due to difficulty with rectal pain post procedure.  Last colonoscopy was completed on 07/18/2012 for history of colonic adenoma which found a 5 mm sessile polyp in the ascending colon as well as 3-4 diminutive polyps in the distal rectum. Colon polyp was found to be tubular adenoma. Recommended 3 year repeat exam. She has never had an endoscopy before.  Today she states she still has rectal bleeding with every bowel movement as scant toilet tissue hematochezia, unless she takes Miralax. Denies abdominal pain, melena, N/V, fever, chills, unintentional weight loss, changes in bowel habits. Denies chest pain, dyspnea, dizziness, lightheadedness, syncope, near syncope. Denies any other upper or lower GI symptoms.  Past Medical History  Diagnosis Date  . Osteoporosis   . Hidradenitis   . Thrombocytopenia ([place]) 01/26/2011  . Hepatitis C 01/26/2011    2005 non responder, genotype 1, Gr 1-2, Stage 3 . Treated with Harvoni at the Liver clinic, responder  . Splenomegaly 01/26/2011  . Cirrhosis of liver ([place])  01/26/2011    FIBROSIS on liver biopsy in 2007, U/S  03/31/2013= mild diffuse hepatic steatosis and /or hepatocellular disease without focal hepatic parenchymal abnormality. per Dr. Patsy Baltimore (2012) pt is immune to hep A and Hep B. per 12/06/13 note from Liver clinic-pt had MRI which showed cirrhosis 2015.  Marland Kitchen Elevated AFP 01/26/2011  . Kidney stones 10/2011  . Hx: UTI (urinary tract infection)   . Serrated adenoma of colon 11/2011  . Hemorrhoids   . Hemorrhoid 12/01/2012  . Tubular adenoma   . Vulval lesion 12/14/2013    Has kissing lesions thin and puffy and paler i color about 5-6- mm in diameter.Dr Glo Herring in to co examine will try temovate and recheck in 3 months  . Diverticulitis of colon   . Hematuria 12/12/2014    Past Surgical History  Procedure Laterality Date  . Cesarean section      x 2  . Axillary gland removal  1979    bilateral  . Colonoscopy  07/31/2005    Rourk-Normal rectum/Normal colonoscopy/Repeat screening colonoscopy ten years  . Breast biopsy  2011    Right  . Colonoscopy  11/16/2011    RMR: Friable anorectum-likely source of hematochezia/ LARGE 1.2x2.5CM SERRATED ADENOMA resected from ascending colon 7CM DISTAL TO ICV, hyperplastic polyp removed   . Colonoscopy N/A 07/18/2012    Dr. Gala Romney- normal appearing rectal mucosa. no residual polpy tissue seen at tatoo location. remainder of colonic mucosa appeared entirely normal. bx = tubular adenoma  . Hemorrhoid banding    . Skin lesion excision      Current Outpatient Prescriptions  Medication  Sig Dispense Refill  . Cholecalciferol (VITAMIN D-3 PO) Take 4,000 Units by mouth daily.    . Cyanocobalamin (VITAMIN B-12 PO) Take 1 tablet by mouth daily.    . Multiple Vitamins-Minerals (COMPLETE MULTIVITAMIN/MINERAL) LIQD Take 1 scoop by mouth daily.    [name] 3-6-9 Fatty Acids CAPS Take 1 capsule by mouth daily.     . vitamin E 400 UNIT capsule Take 400 Units by mouth daily.     No current facility-administered medications for  this visit.    Allergies as of 08/13/2015  . (No Known Allergies)    Family History  Problem Relation Age of Onset  . Heart attack Mother   . Stroke Mother   . Aneurysm Father   . Colon cancer Neg Hx     Social History   Social History  . Marital Status: Married    Spouse Name: N/A  . Number of Children: 2  . Years of Education: N/A   Occupational History  . Machine [employer]   Social History Main Topics  . Smoking status: Never Smoker   . Smokeless tobacco: Never Used  . Alcohol Use: No  . Drug Use: No  . Sexual Activity: Yes    Birth Control/ Protection: Post-menopausal   Other Topics Concern  . None   Social History Narrative    Review of Systems: General: Negative for anorexia, weight loss, fever, chills, fatigue, weakness. ENT: Negative for hoarseness, difficulty swallowing. CV: Negative for chest pain, angina, palpitations, peripheral edema.  Respiratory: Negative for dyspnea at rest, cough, sputum, wheezing.  GI: See history of present illness. Endo: Negative for unusual weight change.  Heme: Negative for bruising or bleeding.   Physical Exam: BP 122/82 mmHg  Pulse 77  Temp(Src) 97.7 F (36.5 C)  Ht 5\' 6"  (1.676 m)  Wt 162 lb (73.483 kg)  BMI 26.16 kg/m2 General:   Alert and oriented. Pleasant and cooperative. Well-nourished and well-developed.  Eyes:  Without icterus, sclera clear and conjunctiva pink.  Cardiovascular:  S1, S2 present without murmurs appreciated. Extremities without clubbing or edema. Respiratory:  Clear to auscultation bilaterally. No wheezes, rales, or rhonchi. No distress.  Gastrointestinal:  +BS, soft, non-tender and non-distended. No HSM noted. No guarding or rebound. No masses appreciated.  Rectal:  Deferred  Musculoskalatal:  Symmetrical without gross deformities. Skin:  Intact without significant lesions or rashes. Neurologic:  Alert and oriented x4;  grossly normal neurologically. Psych:  Alert and  cooperative. Normal mood and affect. Heme/Lymph/Immune: No excessive bruising noted.    08/13/2015 9:09 AM   Disclaimer: This note was dictated with voice recognition software. Similar sounding words can inadvertently be transcribed and may not be corrected upon review.

## 2015-08-13 NOTE — Assessment & Plan Note (Signed)
Patient with a history of serrated adenoma. Last colonoscopy 3 years ago found a 5 mm sessile polyp found to be tubular adenoma, recommended 3 year repeat exam and is currently due. Continued regular scant toilet tissue hematochezia related to hemorrhoids. Attempted hemorrhoid banding with significant pain afterward and decided to not complete additional banding sessions. This point we will proceed with a recommended colonoscopy.  Proceed with TCS with Dr. Gala Romney in near future: the risks, benefits, and alternatives have been discussed with the patient in detail. The patient states understanding and desires to proceed.  Patient is not on any anticoagulants, anxiolytics, chronic pain medications, or antidepressants. Conscious sedation previously effective for her last procedure and should be this time as well.

## 2015-08-13 NOTE — Patient Instructions (Signed)
1. We will schedule your procedures for you. 2. Further recommendations to be based on results of your procedures. 3. Continue to see Dawn at the [facility] hepatology clinic for your routine cirrhosis care.

## 2015-08-13 NOTE — Progress Notes (Signed)
PA# [id number] for TCS/EGD

## 2015-08-13 NOTE — Progress Notes (Signed)
CC'ED TO PCP 

## 2015-08-20 ENCOUNTER — Other Ambulatory Visit (HOSPITAL_COMMUNITY): Payer: Self-pay | Admitting: Family Medicine

## 2015-08-20 DIAGNOSIS — Z1231 Encounter for screening mammogram for malignant neoplasm of breast: Secondary | ICD-10-CM

## 2015-08-23 ENCOUNTER — Ambulatory Visit (HOSPITAL_COMMUNITY): Payer: Commercial Managed Care - HMO

## 2015-08-26 ENCOUNTER — Telehealth: Payer: Self-pay | Admitting: General Practice

## 2015-08-26 NOTE — Telephone Encounter (Signed)
Patient called wanting to ask if she had a mammogram the day before her procedure will it be ok.  I told the patient that it should not be a problem.    Routing to [employer] as a [employer]

## 2015-08-28 ENCOUNTER — Ambulatory Visit (HOSPITAL_COMMUNITY): Payer: Commercial Managed Care - HMO

## 2015-08-28 NOTE — Telephone Encounter (Signed)
Noted, thanks!

## 2015-08-29 ENCOUNTER — Encounter (HOSPITAL_COMMUNITY)
Admission: RE | Disposition: A | Discharge status: Home / Self Care | Payer: Self-pay | Source: Ambulatory Visit | Attending: Internal Medicine

## 2015-08-29 ENCOUNTER — Ambulatory Visit (HOSPITAL_COMMUNITY)
Admission: RE | Admit: 2015-08-29 | Discharge: 2015-08-29 | Disposition: A | Discharge status: Home / Self Care | Payer: Commercial Managed Care - HMO | Source: Ambulatory Visit | Attending: Internal Medicine | Admitting: Internal Medicine

## 2015-08-29 ENCOUNTER — Telehealth: Payer: Self-pay | Admitting: Internal Medicine

## 2015-08-29 ENCOUNTER — Encounter (HOSPITAL_COMMUNITY): Payer: Self-pay | Admitting: *Deleted

## 2015-08-29 DIAGNOSIS — K7469 Other cirrhosis of liver: Secondary | ICD-10-CM

## 2015-08-29 DIAGNOSIS — K317 Polyp of stomach and duodenum: Secondary | ICD-10-CM | POA: Insufficient documentation

## 2015-08-29 DIAGNOSIS — B1920 Unspecified viral hepatitis C without hepatic coma: Secondary | ICD-10-CM | POA: Insufficient documentation

## 2015-08-29 DIAGNOSIS — Z860100 Personal history of colon polyps, unspecified: Secondary | ICD-10-CM | POA: Insufficient documentation

## 2015-08-29 DIAGNOSIS — Q394 Esophageal web: Secondary | ICD-10-CM | POA: Diagnosis not present

## 2015-08-29 DIAGNOSIS — D122 Benign neoplasm of ascending colon: Secondary | ICD-10-CM | POA: Diagnosis not present

## 2015-08-29 DIAGNOSIS — Z1211 Encounter for screening for malignant neoplasm of colon: Secondary | ICD-10-CM | POA: Diagnosis not present

## 2015-08-29 DIAGNOSIS — D696 Thrombocytopenia, unspecified: Secondary | ICD-10-CM | POA: Insufficient documentation

## 2015-08-29 DIAGNOSIS — K222 Esophageal obstruction: Secondary | ICD-10-CM | POA: Diagnosis not present

## 2015-08-29 DIAGNOSIS — K449 Diaphragmatic hernia without obstruction or gangrene: Secondary | ICD-10-CM | POA: Diagnosis not present

## 2015-08-29 DIAGNOSIS — Z13810 Encounter for screening for upper gastrointestinal disorder: Secondary | ICD-10-CM | POA: Insufficient documentation

## 2015-08-29 DIAGNOSIS — K7460 Unspecified cirrhosis of liver: Secondary | ICD-10-CM | POA: Diagnosis not present

## 2015-08-29 HISTORY — PX: ESOPHAGEAL BANDING: SHX5518

## 2015-08-29 HISTORY — PX: COLONOSCOPY: SHX5424

## 2015-08-29 HISTORY — PX: ESOPHAGOGASTRODUODENOSCOPY: SHX5428

## 2015-08-29 SURGERY — COLONOSCOPY
Anesthesia: Moderate Sedation

## 2015-08-29 MED ORDER — STERILE WATER FOR IRRIGATION IR SOLN
Status: DC | PRN
  Administered 2015-08-29: 08:00:00

## 2015-08-29 MED ORDER — MIDAZOLAM HCL 5 MG/5ML IJ SOLN
INTRAMUSCULAR | Status: AC
  Filled 2015-08-29: qty 10

## 2015-08-29 MED ORDER — MEPERIDINE HCL 100 MG/ML IJ SOLN
INTRAMUSCULAR | Status: DC | PRN
  Administered 2015-08-29 (×2): 25 mg via INTRAVENOUS
  Administered 2015-08-29: 50 mg via INTRAVENOUS

## 2015-08-29 MED ORDER — ONDANSETRON HCL 4 MG/2ML IJ SOLN
INTRAMUSCULAR | Status: DC | PRN
  Administered 2015-08-29: 4 mg via INTRAVENOUS

## 2015-08-29 MED ORDER — LIDOCAINE VISCOUS 2 % MT SOLN
OROMUCOSAL | Status: DC | PRN
  Administered 2015-08-29: 3 mL via OROMUCOSAL

## 2015-08-29 MED ORDER — ONDANSETRON HCL 4 MG/2ML IJ SOLN
INTRAMUSCULAR | Status: AC
  Filled 2015-08-29: qty 2

## 2015-08-29 MED ORDER — SODIUM CHLORIDE 0.9 % IV SOLN
INTRAVENOUS | Status: DC
  Administered 2015-08-29: 07:00:00 via INTRAVENOUS

## 2015-08-29 MED ORDER — LIDOCAINE VISCOUS 2 % MT SOLN
OROMUCOSAL | Status: AC
  Filled 2015-08-29: qty 15

## 2015-08-29 MED ORDER — MIDAZOLAM HCL 5 MG/5ML IJ SOLN
INTRAMUSCULAR | Status: DC | PRN
  Administered 2015-08-29: 2 mg via INTRAVENOUS
  Administered 2015-08-29 (×3): 1 mg via INTRAVENOUS

## 2015-08-29 MED ORDER — MEPERIDINE HCL 100 MG/ML IJ SOLN
INTRAMUSCULAR | Status: AC
  Filled 2015-08-29: qty 2

## 2015-08-29 NOTE — Interval H&P Note (Signed)
History and Physical Interval Note:  08/29/2015 7:32 AM  Ariana Long  has presented today for surgery, with the diagnosis of history of polyps/cirrhosis  The various methods of treatment have been discussed with the patient and family. After consideration of risks, benefits and other options for treatment, the patient has consented to  Procedure(s) with comments: COLONOSCOPY (N/A) - 0730 ESOPHAGOGASTRODUODENOSCOPY (EGD) (N/A) ESOPHAGEAL BANDING (N/A) as a surgical intervention .  The patient's history has been reviewed, patient examined, no change in status, stable for surgery.  I have reviewed the patient's chart and labs.  Questions were answered to the patient's satisfaction.     Ariana Long  No change; EGD and TCS per plan The risks, benefits, limitations, imponderables and alternatives regarding both EGD and colonoscopy have been reviewed with the patient. Questions have been answered. All parties agreeable.

## 2015-08-29 NOTE — Progress Notes (Signed)
Ariana Long at Dr Rourk's office notified to schedule Upper GI Series to evaluate Hernia

## 2015-08-29 NOTE — Discharge Instructions (Signed)
Colonoscopy Discharge Instructions  Read the instructions outlined below and refer to this sheet in the next few weeks. These discharge instructions provide you with general information on caring for yourself after you leave the hospital. Your doctor may also give you specific instructions. While your treatment has been planned according to the most current medical practices available, unavoidable complications occasionally occur. If you have any problems or questions after discharge, call Dr. Gala Romney at [phone]. ACTIVITY  You may resume your regular activity, but move at a slower pace for the next 24 hours.   Take frequent rest periods for the next 24 hours.   Walking will help get rid of the air and reduce the bloated feeling in your belly (abdomen).   No driving for 24 hours (because of the medicine (anesthesia) used during the test).    Do not sign any important legal documents or operate any machinery for 24 hours (because of the anesthesia used during the test).  NUTRITION  Drink plenty of fluids.   You may resume your normal diet as instructed by your doctor.   Begin with a light meal and progress to your normal diet. Heavy or fried foods are harder to digest and may make you feel sick to your stomach (nauseated).   Avoid alcoholic beverages for 24 hours or as instructed.  MEDICATIONS  You may resume your normal medications unless your doctor tells you otherwise.  WHAT YOU CAN EXPECT TODAY  Some feelings of bloating in the abdomen.   Passage of more gas than usual.   Spotting of blood in your stool or on the toilet paper.  IF YOU HAD POLYPS REMOVED DURING THE COLONOSCOPY:  No aspirin products for 7 days or as instructed.   No alcohol for 7 days or as instructed.   Eat a soft diet for the next 24 hours.  FINDING OUT THE RESULTS OF YOUR TEST Not all test results are available during your visit. If your test results are not back during the visit, make an appointment  with your caregiver to find out the results. Do not assume everything is normal if you have not heard from your caregiver or the medical facility. It is important for you to follow up on all of your test results.  SEEK IMMEDIATE MEDICAL ATTENTION IF:  You have more than a spotting of blood in your stool.   Your belly is swollen (abdominal distention).   You are nauseated or vomiting.   You have a temperature over 101.  You have abdominal pain or discomfort that is severe or gets worse throughout the day.  EGD Discharge instructions Please read the instructions outlined below and refer to this sheet in the next few weeks. These discharge instructions provide you with general information on caring for yourself after you leave the hospital. Your doctor may also give you specific instructions. While your treatment has been planned according to the most current medical practices available, unavoidable complications occasionally occur. If you have any problems or questions after discharge, please call your doctor. ACTIVITY You may resume your regular activity but move at a slower pace for the next 24 hours.  Take frequent rest periods for the next 24 hours.  Walking will help expel (get rid of) the air and reduce the bloated feeling in your abdomen.  No driving for 24 hours (because of the anesthesia (medicine) used during the test).  You may shower.  Do not sign any important legal documents or operate any machinery for  24 hours (because of the anesthesia used during the test).  NUTRITION Drink plenty of fluids.  You may resume your normal diet.  Begin with a light meal and progress to your normal diet.  Avoid alcoholic beverages for 24 hours or as instructed by your caregiver.  MEDICATIONS You may resume your normal medications unless your caregiver tells you otherwise.  WHAT YOU CAN EXPECT TODAY You may experience abdominal discomfort such as a feeling of fullness or gas pains.    FOLLOW-UP Your doctor will discuss the results of your test with you.  SEEK IMMEDIATE MEDICAL ATTENTION IF ANY OF THE FOLLOWING OCCUR: Excessive nausea (feeling sick to your stomach) and/or vomiting.  Severe abdominal pain and distention (swelling).  Trouble swallowing.  Temperature over 101 F (37.8 C).  Rectal bleeding or vomiting of blood.    EGD Discharge instructions Please read the instructions outlined below and refer to this sheet in the next few weeks. These discharge instructions provide you with general information on caring for yourself after you leave the hospital. Your doctor may also give you specific instructions. While your treatment has been planned according to the most current medical practices available, unavoidable complications occasionally occur. If you have any problems or questions after discharge, please call your doctor. ACTIVITY  You may resume your regular activity but move at a slower pace for the next 24 hours.   Take frequent rest periods for the next 24 hours.   Walking will help expel (get rid of) the air and reduce the bloated feeling in your abdomen.   No driving for 24 hours (because of the anesthesia (medicine) used during the test).   You may shower.   Do not sign any important legal documents or operate any machinery for 24 hours (because of the anesthesia used during the test).  NUTRITION  Drink plenty of fluids.   You may resume your normal diet.   Begin with a light meal and progress to your normal diet.   Avoid alcoholic beverages for 24 hours or as instructed by your caregiver.  MEDICATIONS  You may resume your normal medications unless your caregiver tells you otherwise.  WHAT YOU CAN EXPECT TODAY  You may experience abdominal discomfort such as a feeling of fullness or gas pains.  FOLLOW-UP  Your doctor will discuss the results of your test with you.  SEEK IMMEDIATE MEDICAL ATTENTION IF ANY OF THE FOLLOWING  OCCUR:  Excessive nausea (feeling sick to your stomach) and/or vomiting.   Severe abdominal pain and distention (swelling).   Trouble swallowing.   Temperature over 101 F (37.8 C).   Rectal bleeding or vomiting of blood.     Colon polyp information provided  We'll schedule upper GI series to further evaluate hernia seen on EGD today  Further recommendations to follow pending review of pathology report  Tentatively, plan for repeat EGD in 2 years

## 2015-08-29 NOTE — Op Note (Signed)
[facility] Patient Name: Ariana Long Procedure Date: 08/29/2015 7:33 AM MRN: [id number] Date of Birth: 1955-05-31 Attending MD: Norvel Richards , MD CSN: [id number] Age: 60 Admit Type: Outpatient Procedure:                Upper GI endoscopy - cirrhosis; screen for                            esophageal varices Indications:              Screening procedure Providers:                Norvel Richards, MD, Gwenlyn Fudge, RN, Georgeann Oppenheim, Technician Referring MD:              Medicines:                Midazolam 4 mg IV, Meperidine 100 mg IV,                            Ondansetron 4 mg IV Complications:            No immediate complications. Estimated Blood Loss:     Estimated blood loss was minimal. Estimated blood                            loss was minimal. Procedure:                Pre-Anesthesia Assessment:                           - Prior to the procedure, a History and Physical                            was performed, and patient medications and                            allergies were reviewed. The patient's tolerance of                            previous anesthesia was also reviewed. The risks                            and benefits of the procedure and the sedation                            options and risks were discussed with the patient.                            All questions were answered, and informed consent                            was obtained. Prior Anticoagulants: The patient has  taken no previous anticoagulant or antiplatelet                            agents. ASA Grade Assessment: III - A patient with                            severe systemic disease. After reviewing the risks                            and benefits, the patient was deemed in                            satisfactory condition to undergo the procedure.                           After obtaining informed consent, the endoscope  was                            passed under direct vision. Throughout the                            procedure, the patient's blood pressure, pulse, and                            oxygen saturations were monitored continuously. The                            [id number]) scope was introduced through the                            mouth, and advanced to the second part of duodenum.                            The upper GI endoscopy was accomplished without                            difficulty. The patient tolerated the procedure                            well. Scope In: 7:42:14 AM Scope Out: 7:52:27 AM Total Procedure Duration: 0 hours 10 minutes 13 seconds  Findings:      Noncritical Schatzki's ring present. Somewhat prominent small submucosal       esophageal vessel seen. Slight cystic changes in the distal esophagus.       No out and out varices observed. Stomach emptied. Examination of the       gastrointestinal retroflexion demonstrated a hiatal hernia and possibly       a paraesophageal component there was also a 5 mm polyp in the fundus.       There were no gastric varices. Gastric mucosa otherwise appeared normal.       Pylorus. Normal-appearing first and second portion of the duodenum. This       was biopsied with a cold forceps for histology. Estimated blood loss was       minimal. Impression:               -  One gastric polyp. Biopsied. hiatal                            hernia?"possible paraesophageal component.                            Noncritical Schatzki's ring. No esophageal varices. Moderate Sedation:      Moderate (conscious) sedation was administered by the endoscopy nurse       and supervised by the endoscopist. The following parameters were       monitored: oxygen saturation, heart rate, blood pressure, respiratory       rate, EKG, adequacy of pulmonary ventilation, and response to care.       [Procedure Duration Time]. Recommendation:           - Patient  has a contact number available for                            emergencies. The signs and symptoms of potential                            delayed complications were discussed with the                            patient. Return to normal activities tomorrow.                            Written discharge instructions were provided to the                            patient.                           - Advance diet as tolerated.                           - Continue present medications.                           - Await pathology results.                           - Repeat upper endoscopy in 2 years for screening                            purposes.                           - Return to GI clinic in 3 months. Procedure Code(s):        --- Professional ---                           [phone], Esophagogastroduodenoscopy, flexible,                            transoral; with biopsy, single or multiple Diagnosis Code(s):        --- Professional ---  K31.7, Polyp of stomach and duodenum                           Z13.810, Encounter for screening for upper                            gastrointestinal disorder CPT copyright 2016 American Medical Association. All rights reserved. The codes documented in this report are preliminary and upon coder review may  be revised to meet current compliance requirements. Cristopher Estimable. Nicci Vaughan, MD Norvel Richards, MD 08/29/2015 8:35:34 AM This report has been signed electronically. Number of Addenda: 0

## 2015-08-29 NOTE — Op Note (Signed)
[facility] Patient Name: Ariana Long Procedure Date: 08/29/2015 7:56 AM MRN: [id number] Date of Birth: Jan 18, 1956 Attending MD: Norvel Richards , MD CSN: [id number] Age: 60 Admit Type: Outpatient Procedure:                Colonoscopy with hot snare polypectomy and ablation. Indications:              High risk colon cancer surveillance: Personal                            history of colonic polyps Providers:                Norvel Richards, MD, Gwenlyn Fudge, RN, Georgeann Oppenheim, Technician Referring MD:              Medicines:                Midazolam 5 mg IV, Meperidine 100 mg IV,                            Ondansetron 4 mg IV Complications:            No immediate complications. Estimated Blood Loss:     Estimated blood loss: none. Procedure:                Pre-Anesthesia Assessment:                           - Prior to the procedure, a History and Physical                            was performed, and patient medications and                            allergies were reviewed. The patient's tolerance of                            previous anesthesia was also reviewed. The risks                            and benefits of the procedure and the sedation                            options and risks were discussed with the patient.                            All questions were answered, and informed consent                            was obtained. Prior Anticoagulants: The patient has                            taken no previous anticoagulant or antiplatelet  agents. ASA Grade Assessment: III - A patient with                            severe systemic disease. After reviewing the risks                            and benefits, the patient was deemed in                            satisfactory condition to undergo the procedure.                           - Prior to the procedure, a History and Physical       was performed, and patient medications and                            allergies were reviewed. The patient's tolerance of                            previous anesthesia was also reviewed. The risks                            and benefits of the procedure and the sedation                            options and risks were discussed with the patient.                            All questions were answered, and informed consent                            was obtained. ASA Grade Assessment: III - A patient                            with severe systemic disease. After reviewing the                            risks and benefits, the patient was deemed in                            satisfactory condition to undergo the procedure.                           After obtaining informed consent, the colonoscope                            was passed under direct vision. Throughout the                            procedure, the patient's blood pressure, pulse, and                            oxygen saturations were monitored continuously. The  [id number] scope was introduced through                            the anus and advanced to the the cecum, identified                            by appendiceal orifice and ileocecal valve. The                            colonoscopy was performed without difficulty. The                            patient tolerated the procedure well. The quality                            of the bowel preparation was adequate. The                            ileocecal valve, appendiceal orifice, and rectum                            were photographed. Scope In: 7:59:19 AM Scope Out: 8:21:36 AM Scope Withdrawal Time: 0 hours 16 minutes 50 seconds  Total Procedure Duration: 0 hours 22 minutes 17 seconds  Findings:      The perianal and digital rectal examinations were normal.      A 12 mm polyp was found in the ascending colon. The polyp was sessile.        The polyp was removed with a hot snare. Resection and retrieval were       complete. Estimated blood loss: none.      A 4 mm polyp was found in the ascending colon. The polyp was sessile.       Coagulation for tumor destruction using snare was successful. Estimated       blood loss: none.      The exam was otherwise without abnormality on direct and retroflexion       views. Site of prior tattoo noted in the ascending segment. Impression:               - One 12 mm polyp in the ascending colon, removed                            with a hot snare. Resected and retrieved.                           - One 4 mm polyp in the ascending colon. Treated                            with a hot snare.                           - The examination was otherwise normal on direct                            and retroflexion views. Moderate Sedation:  Moderate (conscious) sedation was administered by the endoscopy nurse       and supervised by the endoscopist. The following parameters were       monitored: oxygen saturation, heart rate, blood pressure, respiratory       rate, EKG, adequacy of pulmonary ventilation, and response to care.       Total physician intraservice time was 47 minutes. Recommendation:           - Patient has a contact number available for                            emergencies. The signs and symptoms of potential                            delayed complications were discussed with the                            patient. Return to normal activities tomorrow.                            Written discharge instructions were provided to the                            patient.                           - Resume previous diet.                           - Continue present medications.                           - Await pathology results.                           - Repeat colonoscopy date to be determined after                            pending pathology results are reviewed for                             surveillance based on pathology results.                           - Return to GI clinic in 3 months. See EGD report. Procedure Code(s):        --- Professional ---                           [phone], Colonoscopy, flexible; with ablation of                            tumor(s), polyp(s), or other lesion(s) (includes                            pre- and post-dilation and guide wire passage, when  performed)                           45385, 59, Colonoscopy, flexible; with removal of                            tumor(s), polyp(s), or other lesion(s) by snare                            technique                           99152, Moderate sedation services provided by the                            same physician or other qualified health care                            professional performing the diagnostic or                            therapeutic service that the sedation supports,                            requiring the presence of an independent trained                            observer to assist in the monitoring of the                            patient's level of consciousness and physiological                            status; initial 15 minutes of intraservice time,                            patient age 56 years or older                           [id number], Moderate sedation services; each additional                            15 minutes intraservice time                           99153, Moderate sedation services; each additional                            15 minutes intraservice time Diagnosis Code(s):        --- Professional ---                           Z86.010, Personal history of colonic polyps                           D12.2, Benign neoplasm of ascending colon CPT  copyright 2016 American Medical Association. All rights reserved. The codes documented in this report are preliminary and upon coder review may  be revised to meet current  compliance requirements. Cristopher Estimable. Ariana Cheslock, MD Norvel Richards, MD 08/29/2015 8:41:11 AM This report has been signed electronically. Number of Addenda: 0

## 2015-08-29 NOTE — Telephone Encounter (Signed)
Rosalyn Gess, RN from short stay called to let us know RMR wants to schedule upper GI series to further evaluate hernia seen on EGD today  Further recommendations to follow pending review of pathology report  Tentatively, plan for repeat EGD in 2 years (let me know if patient will need OV prior to scheduling)

## 2015-08-29 NOTE — H&P (View-Only) (Signed)
Referring Provider: Sharilyn Sites, MD Primary Care Physician:  Purvis Kilts, MD Primary GI:  Dr. Gala Romney  Chief Complaint  Patient presents with  . set up TCS/EGD    HPI:   Ariana Long is a 60 y.o. female who presents to schedule colonoscopy and endoscopy. She was last seen in our office 01/22/2015 for cirrhosis due to hepatitis see status post treatment with Harvoni and sustained viral response. She is followed by [facility] hepatology clinic who follows her hepatoma screening. She was referred to [place] for consideration of endoscopy for esophageal variceal screening. This procedure was postponed until 2017 due to her needing a repeat colonoscopy at that time in order to accomplish procedures at same time. At the time of her last visit she noted continuing rectal bleeding which she attributed to hemorrhoids with hemorrhoid banding 1 completed but no further banding due to difficulty with rectal pain post procedure.  Last colonoscopy was completed on 07/18/2012 for history of colonic adenoma which found a 5 mm sessile polyp in the ascending colon as well as 3-4 diminutive polyps in the distal rectum. Colon polyp was found to be tubular adenoma. Recommended 3 year repeat exam. She has never had an endoscopy before.  Today she states she still has rectal bleeding with every bowel movement as scant toilet tissue hematochezia, unless she takes Miralax. Denies abdominal pain, melena, N/V, fever, chills, unintentional weight loss, changes in bowel habits. Denies chest pain, dyspnea, dizziness, lightheadedness, syncope, near syncope. Denies any other upper or lower GI symptoms.  Past Medical History  Diagnosis Date  . Osteoporosis   . Hidradenitis   . Thrombocytopenia ([place]) 01/26/2011  . Hepatitis C 01/26/2011    2005 non responder, genotype 1, Gr 1-2, Stage 3 . Treated with Harvoni at the Liver clinic, responder  . Splenomegaly 01/26/2011  . Cirrhosis of liver ([place])  01/26/2011    FIBROSIS on liver biopsy in 2007, U/S  03/31/2013= mild diffuse hepatic steatosis and /or hepatocellular disease without focal hepatic parenchymal abnormality. per Dr. Patsy Baltimore (2012) pt is immune to hep A and Hep B. per 12/06/13 note from Liver clinic-pt had MRI which showed cirrhosis 2015.  Marland Kitchen Elevated AFP 01/26/2011  . Kidney stones 10/2011  . Hx: UTI (urinary tract infection)   . Serrated adenoma of colon 11/2011  . Hemorrhoids   . Hemorrhoid 12/01/2012  . Tubular adenoma   . Vulval lesion 12/14/2013    Has kissing lesions thin and puffy and paler i color about 5-6- mm in diameter.Dr Glo Herring in to co examine will try temovate and recheck in 3 months  . Diverticulitis of colon   . Hematuria 12/12/2014    Past Surgical History  Procedure Laterality Date  . Cesarean section      x 2  . Axillary gland removal  1979    bilateral  . Colonoscopy  07/31/2005    Rourk-Normal rectum/Normal colonoscopy/Repeat screening colonoscopy ten years  . Breast biopsy  2011    Right  . Colonoscopy  11/16/2011    RMR: Friable anorectum-likely source of hematochezia/ LARGE 1.2x2.5CM SERRATED ADENOMA resected from ascending colon 7CM DISTAL TO ICV, hyperplastic polyp removed   . Colonoscopy N/A 07/18/2012    Dr. Gala Romney- normal appearing rectal mucosa. no residual polpy tissue seen at tatoo location. remainder of colonic mucosa appeared entirely normal. bx = tubular adenoma  . Hemorrhoid banding    . Skin lesion excision      Current Outpatient Prescriptions  Medication  Sig Dispense Refill  . Cholecalciferol (VITAMIN D-3 PO) Take 4,000 Units by mouth daily.    . Cyanocobalamin (VITAMIN B-12 PO) Take 1 tablet by mouth daily.    . Multiple Vitamins-Minerals (COMPLETE MULTIVITAMIN/MINERAL) LIQD Take 1 scoop by mouth daily.    [name] 3-6-9 Fatty Acids CAPS Take 1 capsule by mouth daily.     . vitamin E 400 UNIT capsule Take 400 Units by mouth daily.     No current facility-administered medications for  this visit.    Allergies as of 08/13/2015  . (No Known Allergies)    Family History  Problem Relation Age of Onset  . Heart attack Mother   . Stroke Mother   . Aneurysm Father   . Colon cancer Neg Hx     Social History   Social History  . Marital Status: Married    Spouse Name: N/A  . Number of Children: 2  . Years of Education: N/A   Occupational History  . Machine [employer]   Social History Main Topics  . Smoking status: Never Smoker   . Smokeless tobacco: Never Used  . Alcohol Use: No  . Drug Use: No  . Sexual Activity: Yes    Birth Control/ Protection: Post-menopausal   Other Topics Concern  . None   Social History Narrative    Review of Systems: General: Negative for anorexia, weight loss, fever, chills, fatigue, weakness. ENT: Negative for hoarseness, difficulty swallowing. CV: Negative for chest pain, angina, palpitations, peripheral edema.  Respiratory: Negative for dyspnea at rest, cough, sputum, wheezing.  GI: See history of present illness. Endo: Negative for unusual weight change.  Heme: Negative for bruising or bleeding.   Physical Exam: BP 122/82 mmHg  Pulse 77  Temp(Src) 97.7 F (36.5 C)  Ht 5\' 6"  (1.676 m)  Wt 162 lb (73.483 kg)  BMI 26.16 kg/m2 General:   Alert and oriented. Pleasant and cooperative. Well-nourished and well-developed.  Eyes:  Without icterus, sclera clear and conjunctiva pink.  Cardiovascular:  S1, S2 present without murmurs appreciated. Extremities without clubbing or edema. Respiratory:  Clear to auscultation bilaterally. No wheezes, rales, or rhonchi. No distress.  Gastrointestinal:  +BS, soft, non-tender and non-distended. No HSM noted. No guarding or rebound. No masses appreciated.  Rectal:  Deferred  Musculoskalatal:  Symmetrical without gross deformities. Skin:  Intact without significant lesions or rashes. Neurologic:  Alert and oriented x4;  grossly normal neurologically. Psych:  Alert and  cooperative. Normal mood and affect. Heme/Lymph/Immune: No excessive bruising noted.    08/13/2015 9:09 AM   Disclaimer: This note was dictated with voice recognition software. Similar sounding words can inadvertently be transcribed and may not be corrected upon review.

## 2015-08-30 ENCOUNTER — Encounter: Payer: Self-pay | Admitting: Internal Medicine

## 2015-08-30 ENCOUNTER — Other Ambulatory Visit: Payer: Self-pay

## 2015-08-30 DIAGNOSIS — K449 Diaphragmatic hernia without obstruction or gangrene: Secondary | ICD-10-CM

## 2015-08-30 NOTE — Telephone Encounter (Signed)
Pt is set up for UGI on 09/03/15 @ 8:30. Husband is aware of appointment

## 2015-09-03 ENCOUNTER — Ambulatory Visit (HOSPITAL_COMMUNITY)
Admission: RE | Admit: 2015-09-03 | Discharge: 2015-09-03 | Disposition: A | Discharge status: Home / Self Care | Payer: Commercial Managed Care - HMO | Source: Ambulatory Visit | Attending: Internal Medicine | Admitting: Internal Medicine

## 2015-09-03 DIAGNOSIS — K449 Diaphragmatic hernia without obstruction or gangrene: Secondary | ICD-10-CM

## 2015-09-04 ENCOUNTER — Encounter (HOSPITAL_COMMUNITY): Payer: Self-pay | Admitting: Internal Medicine

## 2015-09-05 ENCOUNTER — Ambulatory Visit (HOSPITAL_COMMUNITY)
Admission: RE | Admit: 2015-09-05 | Discharge: 2015-09-05 | Disposition: A | Discharge status: Home / Self Care | Payer: Commercial Managed Care - HMO | Source: Ambulatory Visit | Attending: Family Medicine | Admitting: Family Medicine

## 2015-09-05 DIAGNOSIS — Z1231 Encounter for screening mammogram for malignant neoplasm of breast: Secondary | ICD-10-CM | POA: Diagnosis not present

## 2015-09-09 ENCOUNTER — Encounter: Payer: Self-pay | Admitting: Internal Medicine

## 2015-09-09 NOTE — Progress Notes (Signed)
APPT MADE AND LETTER SENT, ON RECALL FOR EGD

## 2015-10-29 ENCOUNTER — Ambulatory Visit (HOSPITAL_COMMUNITY)
Admission: RE | Admit: 2015-10-29 | Discharge: 2015-10-29 | Disposition: A | Discharge status: Home / Self Care | Payer: Commercial Managed Care - HMO | Source: Ambulatory Visit | Attending: Family Medicine | Admitting: Family Medicine

## 2015-10-29 ENCOUNTER — Other Ambulatory Visit (HOSPITAL_COMMUNITY): Payer: Self-pay | Admitting: Family Medicine

## 2015-10-29 DIAGNOSIS — J069 Acute upper respiratory infection, unspecified: Secondary | ICD-10-CM

## 2015-10-29 DIAGNOSIS — R918 Other nonspecific abnormal finding of lung field: Secondary | ICD-10-CM | POA: Diagnosis not present

## 2015-10-29 DIAGNOSIS — J209 Acute bronchitis, unspecified: Secondary | ICD-10-CM

## 2015-11-15 ENCOUNTER — Other Ambulatory Visit (HOSPITAL_COMMUNITY): Payer: Self-pay | Admitting: Family Medicine

## 2015-11-15 DIAGNOSIS — Z6826 Body mass index (BMI) 26.0-26.9, adult: Secondary | ICD-10-CM

## 2015-11-15 DIAGNOSIS — J209 Acute bronchitis, unspecified: Secondary | ICD-10-CM

## 2015-11-15 DIAGNOSIS — J069 Acute upper respiratory infection, unspecified: Secondary | ICD-10-CM

## 2015-11-15 DIAGNOSIS — Z1389 Encounter for screening for other disorder: Secondary | ICD-10-CM

## 2015-12-03 ENCOUNTER — Ambulatory Visit (HOSPITAL_COMMUNITY)
Admission: RE | Admit: 2015-12-03 | Discharge: 2015-12-03 | Disposition: A | Discharge status: Home / Self Care | Payer: Commercial Managed Care - HMO | Source: Ambulatory Visit | Attending: Family Medicine | Admitting: Family Medicine

## 2015-12-03 DIAGNOSIS — K449 Diaphragmatic hernia without obstruction or gangrene: Secondary | ICD-10-CM | POA: Diagnosis not present

## 2015-12-03 DIAGNOSIS — J209 Acute bronchitis, unspecified: Secondary | ICD-10-CM | POA: Diagnosis present

## 2015-12-03 DIAGNOSIS — J069 Acute upper respiratory infection, unspecified: Secondary | ICD-10-CM

## 2015-12-03 DIAGNOSIS — Z6826 Body mass index (BMI) 26.0-26.9, adult: Secondary | ICD-10-CM

## 2015-12-03 DIAGNOSIS — Z1389 Encounter for screening for other disorder: Secondary | ICD-10-CM

## 2015-12-09 ENCOUNTER — Other Ambulatory Visit: Payer: Self-pay | Admitting: Nurse Practitioner

## 2015-12-09 ENCOUNTER — Ambulatory Visit (HOSPITAL_COMMUNITY): Payer: Commercial Managed Care - HMO

## 2015-12-09 DIAGNOSIS — K7460 Unspecified cirrhosis of liver: Secondary | ICD-10-CM

## 2015-12-10 ENCOUNTER — Encounter: Payer: Self-pay | Admitting: Nurse Practitioner

## 2015-12-10 ENCOUNTER — Ambulatory Visit (INDEPENDENT_AMBULATORY_CARE_PROVIDER_SITE_OTHER): Payer: Commercial Managed Care - HMO | Admitting: Nurse Practitioner

## 2015-12-10 VITALS — BP 119/79 | HR 63 | Temp 97.7°F | Ht 66.0 in | Wt 162.8 lb

## 2015-12-10 DIAGNOSIS — Z8619 Personal history of other infectious and parasitic diseases: Secondary | ICD-10-CM

## 2015-12-10 DIAGNOSIS — K7469 Other cirrhosis of liver: Secondary | ICD-10-CM

## 2015-12-10 NOTE — Assessment & Plan Note (Signed)
History of hepatitis C status post treatment with Harvoni with documented sustained viral response and care. Educated patient regarding abstinence on high risk activities to prevent recurrence. Continue to monitor.

## 2015-12-10 NOTE — Patient Instructions (Signed)
1. Have your ultrasound done as scheduled. 2. We will request these results from the imaging center when we follow-up with you in 6 months. 3. Return for follow-up in 6 months for routine cirrhosis care.

## 2015-12-10 NOTE — Progress Notes (Signed)
Referring Provider: Sharilyn Sites, MD Primary Care Physician:  Purvis Kilts, MD Primary GI:  Dr. Gala Romney  Chief Complaint  Patient presents with  . Follow-up    doing well     HPI:   Ariana Long is a 60 y.o. female who presents for follow-up on cirrhosis and history of adenomatous polyp of the colon. She was last seen in our office for 03/23/2016 for the same. Colon endoscopy 3 years prior find a 5 mm sessile polyp to be tubular adenoma with recommended 3 year repeat exam. At that time she noted continued regular scant toilet tissue hematochezia likely related to hemorrhoids, initial attempt at banding with significant pain decided not to have subsequent sessions. Also noted hep C related cirrhosis status post treatment with Harvoni and noted sustained viral response. She was due at that time for screening EGD for possible variceal banding.  Colonoscopy and endoscopy completed 08/29/2015. Colonoscopy noted a single 12 mm polyp in the ascending colon, 4 mm polyp in the ascending colon, otherwise normal exam. Surgical pathology found the polyps to be sessile serrated adenoma. EGD found noncritical Schatzki's ring, somewhat prominent small submucosal vessels noted without obvious varices. Single gastric polyp status post biopsy. Polyp found to be hyperplastic. Recommended repeat colonoscopy in 3 years for surveillance and follow-up in GI office in 3 months.  Today she states she's doing well. Denies abdominal pain, N/V, GERD/dydpepsia symptoms. Continues with occasional scant toilet tissue hematochezia, specifically if more constipated. Takes Benefiber and MiraLAX. She was told she's being released by [facility] status post Hep C treatment to our care for regular cirrhosis follow-up. Denies yellow of skin/eyes, trmors, generalized pruritis, acute episodic confusion. Denies chest pain, dyspnea, dizziness, lightheadedness, syncope, near syncope. Denies any other upper or lower GI  symptoms.  Past Medical History:  Diagnosis Date  . Cirrhosis of liver ([place]) 01/26/2011   FIBROSIS on liver biopsy in 2007, U/S  03/31/2013= mild diffuse hepatic steatosis and /or hepatocellular disease without focal hepatic parenchymal abnormality. per Dr. Patsy Baltimore (2012) pt is immune to hep A and Hep B. per 12/06/13 note from Liver clinic-pt had MRI which showed cirrhosis 2015.  . Diverticulitis of colon   . Elevated AFP 01/26/2011  . Hematuria 12/12/2014  . Hemorrhoid 12/01/2012  . Hemorrhoids   . Hepatitis C 01/26/2011   2005 non responder, genotype 1, Gr 1-2, Stage 3 . Treated with Harvoni at the Liver clinic, responder  . Hidradenitis   . Hx: UTI (urinary tract infection)   . Kidney stones 10/2011  . Osteoporosis   . Serrated adenoma of colon 11/2011  . Splenomegaly 01/26/2011  . Thrombocytopenia ([place]) 01/26/2011  . Tubular adenoma   . Vulval lesion 12/14/2013   Has kissing lesions thin and puffy and paler i color about 5-6- mm in diameter.Dr Glo Herring in to co examine will try temovate and recheck in 3 months    Past Surgical History:  Procedure Laterality Date  . axillary gland removal  1979   bilateral  . BREAST BIOPSY  2011   Right  . CESAREAN SECTION     x 2  . COLONOSCOPY  07/31/2005   Rourk-Normal rectum/Normal colonoscopy/Repeat screening colonoscopy ten years  . COLONOSCOPY  11/16/2011   RMR: Friable anorectum-likely source of hematochezia/ LARGE 1.2x2.5CM SERRATED ADENOMA resected from ascending colon 7CM DISTAL TO ICV, hyperplastic polyp removed   . COLONOSCOPY N/A 07/18/2012   Dr. Gala Romney- normal appearing rectal mucosa. no residual polpy tissue seen at tatoo  location. remainder of colonic mucosa appeared entirely normal. bx = tubular adenoma  . COLONOSCOPY N/A 08/29/2015   Procedure: COLONOSCOPY;  Surgeon: Daneil Dolin, MD;  Location: [facility];  Service: Endoscopy;  Laterality: N/A;  0730  . ESOPHAGEAL BANDING N/A 08/29/2015   Procedure: ESOPHAGEAL BANDING;  Surgeon:  Daneil Dolin, MD;  Location: [facility];  Service: Endoscopy;  Laterality: N/A;  . ESOPHAGOGASTRODUODENOSCOPY N/A 08/29/2015   Procedure: ESOPHAGOGASTRODUODENOSCOPY (EGD);  Surgeon: Daneil Dolin, MD;  Location: [facility];  Service: Endoscopy;  Laterality: N/A;  . HEMORRHOID BANDING    . SKIN LESION EXCISION      Current Outpatient Prescriptions  Medication Sig Dispense Refill  . Cholecalciferol (VITAMIN D-3 PO) Take 4,000 Units by mouth daily.    . Cyanocobalamin (VITAMIN B-12 PO) Take 1 tablet by mouth daily.    Marland Kitchen MILK THISTLE PO Take 1 tablet by mouth daily.    . Multiple Vitamins-Minerals (COMPLETE MULTIVITAMIN/MINERAL) LIQD Take 1 scoop by mouth daily.    [name] 3-6-9 Fatty Acids CAPS Take 1 capsule by mouth daily.     . vitamin E 400 UNIT capsule Take 400 Units by mouth daily.     No current facility-administered medications for this visit.     Allergies as of 12/10/2015  . (No Known Allergies)    Family History  Problem Relation Age of Onset  . Heart attack Mother   . Stroke Mother   . Aneurysm Father   . Colon cancer Neg Hx     Social History   Social History  . Marital status: Married    Spouse name: N/A  . Number of children: 2  . Years of education: N/A   Occupational History  . Machine [employer]   Social History Main Topics  . Smoking status: Never Smoker  . Smokeless tobacco: Never Used  . Alcohol use No  . Drug use: No  . Sexual activity: Yes    Birth control/ protection: Post-menopausal   Other Topics Concern  . Not on file   Social History Narrative  . No narrative on file    Review of Systems: General: Negative for anorexia, weight loss, fever, chills, fatigue, weakness. ENT: Negative for hoarseness, difficulty swallowing. CV: Negative for chest pain, angina, palpitations, peripheral edema.  Respiratory: Negative for dyspnea at rest, cough, sputum, wheezing.  GI: See history of present illness. Endo: Negative for  unusual weight change.  Heme: Negative for bruising or bleeding.   Physical Exam: BP 119/79   Pulse 63   Temp 97.7 F (36.5 C) (Oral)   Ht 5\' 6"  (1.676 m)   Wt 162 lb 12.8 oz (73.8 kg)   BMI 26.28 kg/m  General:   Alert and oriented. Pleasant and cooperative. Well-nourished and well-developed.  Eyes:  Without icterus, sclera clear and conjunctiva pink.  Ears:  Normal auditory acuity. Cardiovascular:  S1, S2 present without murmurs appreciated. Extremities without clubbing or edema. Respiratory:  Clear to auscultation bilaterally. No wheezes, rales, or rhonchi. No distress.  Gastrointestinal:  +BS, rounded but soft, non-tender and non-distended. No HSM noted. No guarding or rebound. No masses appreciated.  Rectal:  Deferred  Musculoskalatal:  Symmetrical without gross deformities. Skin:  Lichens planus rash to right forearm. Neurologic:  Alert and oriented x4;  grossly normal neurologically. Psych:  Alert and cooperative. Normal mood and affect. Heme/Lymph/Immune: No excessive bruising noted.    **Visit length 30 mins over which 50%+ was spent educating and counseling  the patient on lab normals, need for routine follow-up, cirrhosis damage versus liver function.  12/10/2015 8:42 AM   Disclaimer: This note was dictated with voice recognition software. Similar sounding words can inadvertently be transcribed and may not be corrected upon review.

## 2015-12-10 NOTE — Progress Notes (Signed)
Cc'ed to pcp °

## 2015-12-10 NOTE — Assessment & Plan Note (Signed)
Issue with hepatitis C associated cirrhosis. Labs were recently done by the [facility] liver clinic about one week ago, ultrasound is scheduled for a couple weeks from today. Liver function looks well compensated. Extensive discussion and education on structure versus function of the liver related to cirrhosis, need for routine follow-up and monitoring. Patient verbalized understanding. Return for follow-up in 6 months for routine cirrhosis care.  Scores based on outside labs: MELD: 6 Child-Pugh: A

## 2015-12-17 ENCOUNTER — Other Ambulatory Visit (HOSPITAL_COMMUNITY)
Admission: RE | Admit: 2015-12-17 | Discharge: 2015-12-17 | Disposition: A | Discharge status: Home / Self Care | Payer: Commercial Managed Care - HMO | Source: Ambulatory Visit | Attending: Internal Medicine | Admitting: Internal Medicine

## 2015-12-17 ENCOUNTER — Encounter: Payer: Self-pay | Admitting: Adult Health

## 2015-12-17 ENCOUNTER — Ambulatory Visit (INDEPENDENT_AMBULATORY_CARE_PROVIDER_SITE_OTHER): Payer: Commercial Managed Care - HMO | Admitting: Adult Health

## 2015-12-17 VITALS — BP 100/70 | HR 74 | Ht 65.0 in | Wt 163.5 lb

## 2015-12-17 DIAGNOSIS — Z1212 Encounter for screening for malignant neoplasm of rectum: Secondary | ICD-10-CM

## 2015-12-17 DIAGNOSIS — Z1151 Encounter for screening for human papillomavirus (HPV): Secondary | ICD-10-CM | POA: Diagnosis not present

## 2015-12-17 DIAGNOSIS — Z01419 Encounter for gynecological examination (general) (routine) without abnormal findings: Secondary | ICD-10-CM | POA: Diagnosis not present

## 2015-12-17 LAB — HEMOCCULT GUIAC POC 1CARD (OFFICE): Fecal Occult Blood, POC: NEGATIVE

## 2015-12-17 NOTE — Patient Instructions (Signed)
Pap in 3 if normal Physical in 1 year Mammogram yearly Labs with PCP

## 2015-12-17 NOTE — Progress Notes (Signed)
Patient ID: Ariana Long, female   DOB: 1955/12/16, 60 y.o.   MRN: [id number] History of Present Illness:  Ariana Long is a 60 year old black female in for a well woman gyn exam and pap. PCP is Dr Hilma Favors.  Current Medications, Allergies, Past Medical History, Past Surgical History, Family History and Social History were reviewed in [employer] record.     Review of Systems: Patient denies any headaches, hearing loss, fatigue, blurred vision, shortness of breath, chest pain, abdominal pain, problems with bowel movements, urination, or intercourse. No joint pain or mood swings.    Physical Exam:BP 100/70 (BP Location: Left Arm, Patient Position: Sitting, Cuff Size: Normal)   Pulse 74   Ht 5\' 5"  (1.651 m)   Wt 163 lb 8 oz (74.2 kg)   BMI 27.21 kg/m  General:  Well developed, well nourished, no acute distress Skin:  Warm and dry Neck:  Midline trachea, normal thyroid, good ROM, no lymphadenopathy,nio carotid bruits heard  Lungs; Clear to auscultation bilaterally Breast:  No dominant palpable mass, retraction, or nipple discharge Cardiovascular: Regular rate and rhythm Abdomen:  Soft, non tender, no hepatosplenomegaly Pelvic:  External genitalia is normal in appearance, no lesions.  The vagina is normal in appearance. Urethra has no lesions or masses. The cervix is smooth, pap with HPV performed.  Uterus is felt to be normal size, shape, and contour.  No adnexal masses or tenderness noted.Bladder is non tender, no masses felt. Rectal: Good sphincter tone, no polyps, or hemorrhoids felt.  Hemoccult negative. Extremities/musculoskeletal:  No swelling or varicosities noted, no clubbing or cyanosis Psych:  No mood changes, alert and cooperative,seems happy   Impression: Well woman gyn exam and pap    Plan:  Physical in 1 year, pap in 3 if normal Labs with PCP  Mammogram yearly Colonoscopy per GI, just had in April and was good

## 2015-12-19 LAB — CYTOLOGY - PAP

## 2015-12-23 ENCOUNTER — Ambulatory Visit
Admission: RE | Admit: 2015-12-23 | Discharge: 2015-12-23 | Disposition: A | Discharge status: Home / Self Care | Payer: Commercial Managed Care - HMO | Source: Ambulatory Visit | Attending: Nurse Practitioner | Admitting: Nurse Practitioner

## 2015-12-23 DIAGNOSIS — K7460 Unspecified cirrhosis of liver: Secondary | ICD-10-CM

## 2015-12-24 ENCOUNTER — Emergency Department (HOSPITAL_COMMUNITY)
Admission: EM | Admit: 2015-12-24 | Discharge: 2015-12-24 | Disposition: A | Discharge status: Home / Self Care | Payer: Commercial Managed Care - HMO | Attending: Emergency Medicine | Admitting: Emergency Medicine

## 2015-12-24 ENCOUNTER — Emergency Department (HOSPITAL_COMMUNITY): Payer: Commercial Managed Care - HMO

## 2015-12-24 ENCOUNTER — Encounter (HOSPITAL_COMMUNITY): Payer: Self-pay | Admitting: Emergency Medicine

## 2015-12-24 DIAGNOSIS — R072 Precordial pain: Secondary | ICD-10-CM | POA: Insufficient documentation

## 2015-12-24 DIAGNOSIS — Z86010 Personal history of colonic polyps: Secondary | ICD-10-CM | POA: Insufficient documentation

## 2015-12-24 DIAGNOSIS — R079 Chest pain, unspecified: Secondary | ICD-10-CM

## 2015-12-24 LAB — CBC
HCT: 42.9 % (ref 36.0–46.0)
Hemoglobin: 14.2 g/dL (ref 12.0–15.0)
MCH: 30.7 pg (ref 26.0–34.0)
MCHC: 33.1 g/dL (ref 30.0–36.0)
MCV: 92.9 fL (ref 78.0–100.0)
PLATELETS: 152 10*3/uL (ref 150–400)
RBC: 4.62 MIL/uL (ref 3.87–5.11)
RDW: 14.1 % (ref 11.5–15.5)
WBC: 5.9 10*3/uL (ref 4.0–10.5)

## 2015-12-24 LAB — BASIC METABOLIC PANEL
Anion gap: 9 (ref 5–15)
BUN: 15 mg/dL (ref 6–20)
CALCIUM: 10.2 mg/dL (ref 8.9–10.3)
CO2: 26 mmol/L (ref 22–32)
CREATININE: 0.92 mg/dL (ref 0.44–1.00)
Chloride: 106 mmol/L (ref 101–111)
GLUCOSE: 100 mg/dL — AB (ref 65–99)
Potassium: 3.9 mmol/L (ref 3.5–5.1)
Sodium: 141 mmol/L (ref 135–145)

## 2015-12-24 LAB — I-STAT TROPONIN, ED
TROPONIN I, POC: 0 ng/mL (ref 0.00–0.08)
Troponin i, poc: 0 ng/mL (ref 0.00–0.08)

## 2015-12-24 MED ORDER — NAPROXEN 250 MG PO TABS
500.0000 mg | ORAL_TABLET | Freq: Once | ORAL | Status: AC
  Administered 2015-12-24: 500 mg via ORAL
  Filled 2015-12-24: qty 2

## 2015-12-24 NOTE — ED Provider Notes (Signed)
[facility] DEPT Provider Note   CSN: [id number] Arrival date & time: 12/24/15  [id number]     History   Chief Complaint Chief Complaint  Patient presents with  . Chest Pain    HPI Ariana Long is a 60 y.o. female.  HPI   Ariana Long is a 60 y.o. female, with a history of Cirrhosis of the liver, presenting to the ED with chest pain that began around 2 am this morning. Pt woke up and then began having the pain. Pain is 2/10, retrosternal, described as a "nagging soreness," nonradiating. Originally the pain was around a 3 or 4/10. Took TUMS around 5 am and the pain began to subside. Has had similar pain before and diagnosed with costochondritis. Patient denies nausea/vomiting, cough, fever/chills, shortness of breath, or any other complaints.  Accompanied by her husband at the bedside.    Past Medical History:  Diagnosis Date  . Cirrhosis of liver ([place]) 01/26/2011   FIBROSIS on liver biopsy in 2007, U/S  03/31/2013= mild diffuse hepatic steatosis and /or hepatocellular disease without focal hepatic parenchymal abnormality. per Dr. Patsy Baltimore (2012) pt is immune to hep A and Hep B. per 12/06/13 note from Liver clinic-pt had MRI which showed cirrhosis 2015.  . Diverticulitis of colon   . Elevated AFP 01/26/2011  . Hematuria 12/12/2014  . Hemorrhoid 12/01/2012  . Hemorrhoids   . Hepatitis C 01/26/2011   2005 non responder, genotype 1, Gr 1-2, Stage 3 . Treated with Harvoni at the Liver clinic, responder  . Hidradenitis   . Hx: UTI (urinary tract infection)   . Kidney stones 10/2011  . Osteoporosis   . Serrated adenoma of colon 11/2011  . Splenomegaly 01/26/2011  . Thrombocytopenia ([place]) 01/26/2011  . Tubular adenoma   . Vulval lesion 12/14/2013   Has kissing lesions thin and puffy and paler i color about 5-6- mm in diameter.Dr Glo Herring in to co examine will try temovate and recheck in 3 months    Patient Active Problem List   Diagnosis Date Noted  . History of hepatitis C 12/10/2015    . Hiatal hernia   . Gastric polyp   . Schatzki's ring   . History of colonic polyps   . History of adenomatous polyp of colon 08/13/2015  . Hematuria 12/12/2014  . Vulval lesion 12/14/2013  . Hypersplenism 01/20/2013  . Hemorrhoid 12/01/2012  . Palpitations 10/12/2012  . Murmur, cardiac 10/12/2012  . Constipation 06/07/2012  . Serrated adenoma of colon 11/02/2011  . Anal fissure 09/04/2011  . Hematochezia 09/04/2011  . Thrombocytopenia ([place]) 01/26/2011  . Splenomegaly 01/26/2011  . Cirrhosis of liver ([place]) 01/26/2011  . Elevated AFP 01/26/2011    Past Surgical History:  Procedure Laterality Date  . axillary gland removal  1979   bilateral  . BREAST BIOPSY  2011   Right  . CESAREAN SECTION     x 2  . COLONOSCOPY  07/31/2005   Rourk-Normal rectum/Normal colonoscopy/Repeat screening colonoscopy ten years  . COLONOSCOPY  11/16/2011   RMR: Friable anorectum-likely source of hematochezia/ LARGE 1.2x2.5CM SERRATED ADENOMA resected from ascending colon 7CM DISTAL TO ICV, hyperplastic polyp removed   . COLONOSCOPY N/A 07/18/2012   Dr. Gala Romney- normal appearing rectal mucosa. no residual polpy tissue seen at tatoo location. remainder of colonic mucosa appeared entirely normal. bx = tubular adenoma  . COLONOSCOPY N/A 08/29/2015   Procedure: COLONOSCOPY;  Surgeon: Daneil Dolin, MD;  [facility];  Service: Endoscopy;  Laterality: N/A;  0730  .  ESOPHAGEAL BANDING N/A 08/29/2015   Procedure: ESOPHAGEAL BANDING;  Surgeon: Daneil Dolin, MD;  [facility];  Service: Endoscopy;  Laterality: N/A;  . ESOPHAGOGASTRODUODENOSCOPY N/A 08/29/2015   Procedure: ESOPHAGOGASTRODUODENOSCOPY (EGD);  Surgeon: Daneil Dolin, MD;  [facility];  Service: Endoscopy;  Laterality: N/A;  . HEMORRHOID BANDING    . SKIN LESION EXCISION      OB History    Gravida Para Term Preterm AB Living   3 3   1   2    SAB TAB Ectopic Multiple Live Births           2       Home  Medications    Prior to Admission medications   Medication Sig Start Date End Date Taking? Authorizing Provider  Cholecalciferol (VITAMIN D-3 PO) Take 4,000 Units by mouth daily.   Yes Historical Provider, MD  Cyanocobalamin (VITAMIN B-12 PO) Take 1 tablet by mouth daily.   Yes Historical Provider, MD  MILK THISTLE PO Take 1 tablet by mouth daily.   Yes Historical Provider, MD  Multiple Vitamins-Minerals (COMPLETE MULTIVITAMIN/MINERAL) LIQD Take 1 scoop by mouth daily.   Yes Historical Provider, MD  Omega 3-6-9 Fatty Acids CAPS Take 1 capsule by mouth daily.    Yes Historical Provider, MD  Selenium 100 MCG TABS Take by mouth daily.   Yes Historical Provider, MD  vitamin E 400 UNIT capsule Take 400 Units by mouth daily.   Yes Historical Provider, MD    Family History Family History  Problem Relation Age of Onset  . Heart attack Mother   . Stroke Mother   . Aneurysm Father   . Colon cancer Neg Hx     Social History Social History  Substance Use Topics  . Smoking status: Never Smoker  . Smokeless tobacco: Never Used  . Alcohol use No     Allergies   Review of patient's allergies indicates no known allergies.   Review of Systems Review of Systems  Constitutional: Negative for chills, diaphoresis and fever.  Respiratory: Negative for cough, chest tightness and shortness of breath.   Cardiovascular: Positive for chest pain. Negative for palpitations and leg swelling.  Gastrointestinal: Negative for abdominal pain, nausea and vomiting.  Genitourinary: Negative for dysuria and flank pain.  Musculoskeletal: Negative for back pain.  Skin: Negative for color change and pallor.  Neurological: Negative for dizziness, syncope, weakness and light-headedness.  All other systems reviewed and are negative.    Physical Exam Updated Vital Signs BP 142/89 (BP Location: Left Arm)   Pulse (!) 56   Temp 97.7 F (36.5 C) (Oral)   Resp 16   Ht 5\' 5"  (1.651 m)   Wt 74.8 kg   SpO2 98%    BMI 27.46 kg/m   Physical Exam  Constitutional: She is oriented to person, place, and time. She appears well-developed and well-nourished. No distress.  HENT:  Head: Normocephalic and atraumatic.  Eyes: Conjunctivae are normal.  Neck: Neck supple.  Cardiovascular: Normal rate, regular rhythm, normal heart sounds and intact distal pulses.   Pulmonary/Chest: Effort normal and breath sounds normal. No respiratory distress. She exhibits no tenderness.  Abdominal: Soft. There is no tenderness. There is no guarding.  Musculoskeletal: She exhibits no edema or tenderness.  Lymphadenopathy:    She has no cervical adenopathy.  Neurological: She is alert and oriented to person, place, and time.  Skin: Skin is warm and dry. She is not diaphoretic.  Psychiatric: She has a normal  mood and affect. Her behavior is normal.  Nursing note and vitals reviewed.    ED Treatments / Results  Labs (all labs ordered are listed, but only abnormal results are displayed) Labs Reviewed  BASIC METABOLIC PANEL - Abnormal; Notable for the following:       Result Value   Glucose, Bld 100 (*)    All other components within normal limits  CBC  I-STAT TROPOININ, ED  I-STAT TROPOININ, ED    EKG  EKG Interpretation  Date/Time:  Tuesday December 24 2015 06:37:00 EDT Ventricular Rate:  52 PR Interval:  162 QRS Duration: 82 QT Interval:  434 QTC Calculation: 403 R Axis:   14 Text Interpretation:  Sinus bradycardia Nonspecific T wave abnormality Abnormal ECG No old tracing to compare Confirmed by WARD,  DO, KRISTEN ([id number]) on 12/24/2015 7:15:03 AM Also confirmed by WARD,  DO, KRISTEN [phone]), editor [place], Joelene Millin [phone])  on 12/24/2015 8:09:34 AM       Radiology Dg Chest 2 View  Result Date: 12/24/2015 CLINICAL DATA:  Cough for 2 days.  Chest pain. EXAM: CHEST  2 VIEW COMPARISON:  Radiographs 10/29/2015, chest CT 12/03/2015 FINDINGS: The cardiomediastinal contours are unchanged, hiatal hernia at the  thoracic inlet. The lungs are clear. Pulmonary vasculature is normal. No consolidation, pleural effusion, or pneumothorax. No acute osseous abnormalities are seen. IMPRESSION: No active cardiopulmonary disease. Electronically Signed   By: Jeb Levering M.D.   On: 12/24/2015 06:57   US Abdomen Limited Ruq  Result Date: 12/23/2015 CLINICAL DATA:  Hepatic cirrhosis. EXAM: US ABDOMEN LIMITED - RIGHT UPPER QUADRANT COMPARISON:  01/04/2015. FINDINGS: Gallbladder: Tiny gallbladder calculi noted, largest measures 7 mm. Gallbladder wall thickness normal. Negative Murphy sign. Common bile duct: Diameter: 3.5 mm Liver: There is echogenic consistent fatty infiltration and/or hepatocellular disease. No focal hepatic abnormality identified . IMPRESSION: 1. Tiny gallbladder calculi. 2. Liver is echogenic consistent fatty infiltration and/ or hepatocellular disease. Electronically Signed   By: Marcello Moores  Register   On: 12/23/2015 08:50    Procedures Procedures (including critical care time)  Medications Ordered in ED Medications  naproxen (NAPROSYN) tablet 500 mg (500 mg Oral Given 12/24/15 0805)     Initial Impression / Assessment and Plan / ED Course  I have reviewed the triage vital signs and the nursing notes.  Pertinent labs & imaging results that were available during my care of the patient were reviewed by me and considered in my medical decision making (see chart for details).  Clinical Course    Ariana Long presents with chest soreness beginning around 2 AM this morning.  Findings and plan of care discussed with Charlesetta Shanks, MD.   Low suspicion for ACS. HEART score is 2, indicating low risk for a cardiac event. Wells criteria score is 0, indicating low risk for PE. Patient pain free upon discharge with naproxen. Patient is nontoxic appearing, afebrile, not tachycardic, not tachypneic, not hypotensive, maintains adequate SPO2 on room air, and is in no apparent distress. Patient has no signs  of sepsis or other serious or life-threatening condition. Patient able to ambulate without difficulty. States that her heart rate has been lower in the past. Delta troponins negative. Patient to follow up with PCP within the next 48 hours. The patient was given instructions for home care as well as strict return precautions. Patient voices understanding of these instructions, accepts the plan, and is comfortable with discharge.   Vitals:   12/24/15 0945 12/24/15 1000 12/24/15 1015 12/24/15 1030  BP:  123/72 123/78 121/71 121/79  Pulse: (!) 45 (!) 52 (!) 54 (!) 50  Resp: 17 17 14 16   Temp:      TempSrc:      SpO2: 91% 100% 100% 100%  Weight:      Height:         Final Clinical Impressions(s) / ED Diagnoses   Final diagnoses:  Chest pain, unspecified chest pain type    New Prescriptions Discharge Medication List as of 12/24/2015 10:20 AM       Lorayne Bender, PA-C 12/24/15 1235    Charlesetta Shanks, MD 12/28/15 2348

## 2015-12-24 NOTE — Discharge Instructions (Signed)
You have been seen today for chest pain. Your imaging and lab tests showed no abnormalities. There were no signs of heart attack on your labs or EKG. Follow up with PCP as soon as possible for reevaluation. Return to ED should symptoms recur.

## 2015-12-24 NOTE — ED Triage Notes (Signed)
Patient woke up this morning with central chest pain non radiating , denies SOB , no nausea or diaphoresis .

## 2016-02-11 NOTE — Progress Notes (Signed)
Cardiology Office Note   Date:  02/13/2016   ID:  Ariana Long, DOB Dec 08, 1955, MRN [id number]  PCP:  Purvis Kilts, MD  Cardiologist:   Jenkins Rouge, MD   No chief complaint on file.     History of Present Illness: Ariana Long is a 60 y.o. female who presents for chest pain and family history of CAD. History of cirrhosis from hep C Previously seen by Dr Sallyanne Kuster for palpitations. Thought to be benign from PAC;s with no further w/u  Echo :  10/19/12  Normal EF 55-60% w Mild MR   Seen in ER few weeks ago for atypical chest pain. Has had shoulder pains in past relieved with NSAI's And prednisone shot. Pain short lived minutes center of chest and radiated to shoulders  R/O ECG Nonspecific changes CXR NAD.   Been ok since ER visit. Her palpitation were cured by eating raisons    Past Medical History:  Diagnosis Date  . Cirrhosis of liver ([place]) 01/26/2011   FIBROSIS on liver biopsy in 2007, U/S  03/31/2013= mild diffuse hepatic steatosis and /or hepatocellular disease without focal hepatic parenchymal abnormality. per Dr. Patsy Baltimore (2012) pt is immune to hep A and Hep B. per 12/06/13 note from Liver clinic-pt had MRI which showed cirrhosis 2015.  . Diverticulitis of colon   . Elevated AFP 01/26/2011  . Hematuria 12/12/2014  . Hemorrhoid 12/01/2012  . Hemorrhoids   . Hepatitis C 01/26/2011   2005 non responder, genotype 1, Gr 1-2, Stage 3 . Treated with Harvoni at the Liver clinic, responder  . Hidradenitis   . Hx: UTI (urinary tract infection)   . Kidney stones 10/2011  . Osteoporosis   . Serrated adenoma of colon 11/2011  . Splenomegaly 01/26/2011  . Thrombocytopenia ([place]) 01/26/2011  . Tubular adenoma   . Vulval lesion 12/14/2013   Has kissing lesions thin and puffy and paler i color about 5-6- mm in diameter.Dr Glo Herring in to co examine will try temovate and recheck in 3 months    Past Surgical History:  Procedure Laterality Date  . axillary gland removal  1979   bilateral  . BREAST BIOPSY  2011   Right  . CESAREAN SECTION     x 2  . COLONOSCOPY  07/31/2005   Rourk-Normal rectum/Normal colonoscopy/Repeat screening colonoscopy ten years  . COLONOSCOPY  11/16/2011   RMR: Friable anorectum-likely source of hematochezia/ LARGE 1.2x2.5CM SERRATED ADENOMA resected from ascending colon 7CM DISTAL TO ICV, hyperplastic polyp removed   . COLONOSCOPY N/A 07/18/2012   Dr. Gala Romney- normal appearing rectal mucosa. no residual polpy tissue seen at tatoo location. remainder of colonic mucosa appeared entirely normal. bx = tubular adenoma  . COLONOSCOPY N/A 08/29/2015   Procedure: COLONOSCOPY;  Surgeon: Daneil Dolin, MD;  [facility];  Service: Endoscopy;  Laterality: N/A;  0730  . ESOPHAGEAL BANDING N/A 08/29/2015   Procedure: ESOPHAGEAL BANDING;  Surgeon: Daneil Dolin, MD;  [facility];  Service: Endoscopy;  Laterality: N/A;  . ESOPHAGOGASTRODUODENOSCOPY N/A 08/29/2015   Procedure: ESOPHAGOGASTRODUODENOSCOPY (EGD);  Surgeon: Daneil Dolin, MD;  [facility];  Service: Endoscopy;  Laterality: N/A;  . HEMORRHOID BANDING    . SKIN LESION EXCISION       Current Outpatient Prescriptions  Medication Sig Dispense Refill  . Cholecalciferol (VITAMIN D-3 PO) Take 4,000 Units by mouth daily.    . Cyanocobalamin (VITAMIN B-12 PO) Take 1 tablet by mouth daily.    Marland Kitchen  MILK THISTLE PO Take 1 tablet by mouth daily.    . Multiple Vitamins-Minerals (COMPLETE MULTIVITAMIN/MINERAL) LIQD Take 1 scoop by mouth daily.    [name] 3-6-9 Fatty Acids CAPS Take 1 capsule by mouth daily.     . Selenium 100 MCG TABS Take by mouth daily.    . vitamin E 400 UNIT capsule Take 400 Units by mouth daily.     No current facility-administered medications for this visit.     Allergies:   Review of patient's allergies indicates no known allergies.    Social History:  The patient  reports that she has never smoked. She has never used smokeless tobacco. She  reports that she does not drink alcohol or use drugs.   Family History:  The patient's family history includes Aneurysm in her father; Heart attack in her mother; Stroke in her mother.    ROS:  Please see the history of present illness.   Otherwise, review of systems are positive for none.   All other systems are reviewed and negative.    PHYSICAL EXAM: VS:  BP 122/78   Pulse 82   Ht 5\' 5"  (1.651 m)   Wt 73.9 kg (163 lb)   SpO2 93%   BMI 27.12 kg/m  , BMI Body mass index is 27.12 kg/m. Affect appropriate Healthy:  appears stated age 36: normal Neck supple with no adenopathy JVP normal no bruits no thyromegaly Lungs clear with no wheezing and good diaphragmatic motion Heart:  S1/S2 no murmur, no rub, gallop or click PMI normal Abdomen: benighn, BS positve, no tenderness, no AAA no bruit.  No HSM or HJR Distal pulses intact with no bruits No edema Neuro non-focal Skin warm and dry No muscular weakness    EKG:   12/25/15 SR rate 53 nonspecific ST/T wave changes    Recent Labs: 12/24/2015: BUN 15; Creatinine, Ser 0.92; Hemoglobin 14.2; Platelets 152; Potassium 3.9; Sodium 141    Lipid Panel No results found for: CHOL, TRIG, HDL, CHOLHDL, VLDL, LDLCALC, LDLDIRECT    Wt Readings from Last 3 Encounters:  02/13/16 73.9 kg (163 lb)  12/24/15 74.8 kg (165 lb)  12/17/15 74.2 kg (163 lb 8 oz)      Other studies Reviewed: Additional studies/ records that were reviewed today include: Notes [facility] Echo ER notes labs ecg.    ASSESSMENT AND PLAN:  1.  Chest Pain : nonspecific ST changes f/u exercise myovue  2. Palpitations: resolved ? Related to low K previous echo with no structural heart dx and normal cardiac exam 3. Cirrhosis:  Has had Rx  For hep C improved f/u GI   Current medicines are reviewed at length with the patient today.  The patient does not have concerns regarding medicines.  The following changes have been made:  no change  Labs/ tests ordered today  include: Ex Myovue   Orders Placed This Encounter  Procedures  . NM Myocar Multi W/Spect W/Wall Motion / EF     Disposition:   FU with PRN      Signed, Jenkins Rouge, MD  02/13/2016 2:21 PM    [facility] [address], [place], [place]  [address] Phone: [phone]; Fax: [phone]

## 2016-02-13 ENCOUNTER — Ambulatory Visit (INDEPENDENT_AMBULATORY_CARE_PROVIDER_SITE_OTHER): Payer: Commercial Managed Care - HMO | Admitting: Cardiovascular Disease

## 2016-02-13 ENCOUNTER — Encounter: Payer: Self-pay | Admitting: Cardiovascular Disease

## 2016-02-13 VITALS — BP 122/78 | HR 82 | Ht 65.0 in | Wt 163.0 lb

## 2016-02-13 DIAGNOSIS — R079 Chest pain, unspecified: Secondary | ICD-10-CM

## 2016-02-13 NOTE — Patient Instructions (Addendum)
Medication Instructions:  Your physician recommends that you continue on your current medications as directed. Please refer to the Current Medication list given to you today.    Labwork: NONE  Testing/Procedures: Your physician has requested that you have en exercise stress myoview. For further information please visit [email or web address]. Please follow instruction sheet, as given.    Follow-Up: Your physician recommends that you schedule a follow-up appointment in: AS NEEDED    Any Other Special Instructions Will Be Listed Below (If Applicable).     If you need a refill on your cardiac medications before your next appointment, please call your pharmacy.   

## 2016-02-17 ENCOUNTER — Inpatient Hospital Stay (HOSPITAL_COMMUNITY): Admission: RE | Admit: 2016-02-17 | Payer: Commercial Managed Care - HMO | Source: Ambulatory Visit

## 2016-02-17 ENCOUNTER — Encounter (HOSPITAL_COMMUNITY)
Admission: RE | Admit: 2016-02-17 | Discharge: 2016-02-17 | Disposition: A | Discharge status: Home / Self Care | Payer: Commercial Managed Care - HMO | Source: Ambulatory Visit | Attending: Cardiovascular Disease | Admitting: Cardiovascular Disease

## 2016-02-17 ENCOUNTER — Encounter (HOSPITAL_COMMUNITY): Payer: Self-pay

## 2016-02-17 DIAGNOSIS — R079 Chest pain, unspecified: Secondary | ICD-10-CM | POA: Diagnosis not present

## 2016-02-17 LAB — NM MYOCAR MULTI W/SPECT W/WALL MOTION / EF
CHL CUP NUCLEAR SRS: 4
CHL CUP NUCLEAR SSS: 6
CHL RATE OF PERCEIVED EXERTION: 13
CSEPED: 7 min
CSEPEW: 10.1 METS
Exercise duration (sec): 30 s
LHR: 0.39
LV dias vol: 31 mL (ref 46–106)
LV sys vol: 6 mL
MPHR: 158 {beats}/min
NUC STRESS TID: 0.98
Peak HR: 142 {beats}/min
Percent HR: 89 %
Rest HR: 59 {beats}/min
SDS: 2

## 2016-02-17 MED ORDER — SODIUM CHLORIDE 0.9% FLUSH
INTRAVENOUS | Status: AC
  Filled 2016-02-17: qty 120

## 2016-02-17 MED ORDER — SODIUM CHLORIDE 0.9% FLUSH
INTRAVENOUS | Status: AC
  Administered 2016-02-17: 10 mL via INTRAVENOUS
  Filled 2016-02-17: qty 10

## 2016-02-17 MED ORDER — REGADENOSON 0.4 MG/5ML IV SOLN
INTRAVENOUS | Status: AC
  Filled 2016-02-17: qty 5

## 2016-02-17 MED ORDER — TECHNETIUM TC 99M TETROFOSMIN IV KIT
10.0000 | PACK | Freq: Once | INTRAVENOUS | Status: AC | PRN
  Administered 2016-02-17: 11 via INTRAVENOUS

## 2016-02-17 MED ORDER — TECHNETIUM TC 99M TETROFOSMIN IV KIT
30.0000 | PACK | Freq: Once | INTRAVENOUS | Status: AC | PRN
  Administered 2016-02-17: 30.2 via INTRAVENOUS

## 2016-02-18 ENCOUNTER — Telehealth: Payer: Self-pay | Admitting: Cardiovascular Disease

## 2016-02-18 NOTE — Telephone Encounter (Signed)
Called patient with her stress test results. Per Dr. Johnsie Cancel, normal myoview study with no evidence of ischemia or infarction. Patient verbalized understanding and requested results be mail to her. Verified patient's address, will mail out today.

## 2016-02-18 NOTE — Telephone Encounter (Signed)
New message   Pt verbalized that she is returning call for rn about her appt on 02/17/16

## 2016-04-21 ENCOUNTER — Ambulatory Visit: Payer: Commercial Managed Care - HMO | Admitting: Obstetrics & Gynecology

## 2016-05-01 ENCOUNTER — Ambulatory Visit: Payer: Commercial Managed Care - HMO | Admitting: Obstetrics & Gynecology

## 2016-05-12 ENCOUNTER — Ambulatory Visit (INDEPENDENT_AMBULATORY_CARE_PROVIDER_SITE_OTHER): Payer: Commercial Managed Care - HMO | Admitting: Adult Health

## 2016-05-12 ENCOUNTER — Encounter: Payer: Self-pay | Admitting: Adult Health

## 2016-05-12 VITALS — BP 103/60 | HR 69 | Ht 65.5 in | Wt 166.0 lb

## 2016-05-12 DIAGNOSIS — L298 Other pruritus: Secondary | ICD-10-CM | POA: Diagnosis not present

## 2016-05-12 DIAGNOSIS — L280 Lichen simplex chronicus: Secondary | ICD-10-CM | POA: Insufficient documentation

## 2016-05-12 DIAGNOSIS — N898 Other specified noninflammatory disorders of vagina: Secondary | ICD-10-CM

## 2016-05-12 MED ORDER — CLOBETASOL PROPIONATE 0.05 % EX OINT: 1.0000 "application " | TOPICAL_OINTMENT | Freq: Two times a day (BID) | CUTANEOUS | 2 refills | Status: DC

## 2016-05-12 NOTE — Progress Notes (Signed)
Subjective:     Patient ID: Ariana Long, female   DOB: [birth year], 61 y.o.   MRN: [id number]  HPI Ariana Long is a 61 year old black female in complaining of vaginal discharge and itching in vaginal area.  Review of Systems +vaginal discharge +itching Reviewed past medical,surgical, social and family history. Reviewed medications and allergies.     Objective:   Physical Exam BP 103/60 (BP Location: Left Arm, Patient Position: Sitting, Cuff Size: Normal)   Pulse 69   Ht 5' 5.5" (1.664 m)   Wt 166 lb (75.3 kg)   BMI 27.20 kg/m   PHQ 2 score 0. Skin warm and dry.Pelvic: external genitalia has skin texture changes that is slightly puffy and rough, but thin looking, vagina: has loss of color, moisture and rugae,urethra has no lesions or masses noted, cervix:smooth, uterus: normal size, shape and contour, non tender, no masses felt, adnexa: no masses or tenderness noted. Bladder is non tender and no masses felt.     Discussed with her that this is chronic and will try temovate, and recheck in 4 weeks, if not better will get Dr Glo Herring to reassess and possibly biopsy area to confirm, he has seen in past and declined to biopsy.  Face time 15 minutes with 50% counseling.   Assessment:     1. Vaginal discharge   2. Itching in the vaginal area   3. Lichenification and lichen simplex chronicus       Plan:     Rx temovate 0.05% ointment use bid #30 gm with 2 refills Follow up in 4 weeks

## 2016-05-28 ENCOUNTER — Ambulatory Visit: Payer: Commercial Managed Care - HMO | Admitting: Adult Health

## 2016-06-09 ENCOUNTER — Ambulatory Visit: Payer: Commercial Managed Care - HMO | Admitting: Adult Health

## 2016-06-09 ENCOUNTER — Other Ambulatory Visit: Payer: Self-pay | Admitting: Nurse Practitioner

## 2016-06-09 DIAGNOSIS — K7469 Other cirrhosis of liver: Secondary | ICD-10-CM

## 2016-06-15 ENCOUNTER — Ambulatory Visit: Payer: Commercial Managed Care - HMO | Admitting: Nurse Practitioner

## 2016-06-18 ENCOUNTER — Ambulatory Visit
Admission: RE | Admit: 2016-06-18 | Discharge: 2016-06-18 | Disposition: A | Discharge status: Home / Self Care | Payer: Commercial Managed Care - HMO | Source: Ambulatory Visit | Attending: Nurse Practitioner | Admitting: Nurse Practitioner

## 2016-06-18 DIAGNOSIS — K7469 Other cirrhosis of liver: Secondary | ICD-10-CM

## 2016-07-08 ENCOUNTER — Encounter: Payer: Self-pay | Admitting: Adult Health

## 2016-07-08 ENCOUNTER — Ambulatory Visit (INDEPENDENT_AMBULATORY_CARE_PROVIDER_SITE_OTHER): Payer: Commercial Managed Care - HMO | Admitting: Adult Health

## 2016-07-08 VITALS — BP 118/76 | HR 69 | Ht 66.0 in | Wt 164.0 lb

## 2016-07-08 DIAGNOSIS — L280 Lichen simplex chronicus: Secondary | ICD-10-CM | POA: Diagnosis not present

## 2016-07-08 NOTE — Progress Notes (Signed)
Subjective:     Patient ID: Ariana Long, female   DOB: 20-Apr-1956, 61 y.o.   MRN: [id number]  HPI Ariana Long is a 61 year old black female in for follow up of using temovate on labia, and she has less itching and feels better.   Review of Systems Less itching, feels better Reviewed past medical,surgical, social and family history. Reviewed medications and allergies.     Objective:   Physical Exam BP 118/76 (BP Location: Left Arm, Patient Position: Sitting, Cuff Size: Normal)   Pulse 69   Ht 5\' 6"  (1.676 m)   Wt 164 lb (74.4 kg)   BMI 26.47 kg/m Skin warm and dry, external genitalis looks much better, less skin texture changes, not as puffy or rough.Reinterrated that this ins chronic and will need to use temovate on regular basis.    Assessment:     1. Lichenification and lichen simplex chronicus       Plan:    Continue to use temovate cream 2-3 x weekly  Return in about 5-6 months for physical or before if needed

## 2016-09-30 ENCOUNTER — Telehealth: Payer: Self-pay | Admitting: Adult Health

## 2016-09-30 NOTE — Telephone Encounter (Signed)
LMOVM to return call.

## 2016-10-02 NOTE — Telephone Encounter (Signed)
Spoke to patient who states bleeding was just one time and is better now, not bleeding at all. Advised patient that if bleeding came back to give [place] a call. Verbalized understanding.

## 2016-12-08 ENCOUNTER — Other Ambulatory Visit: Payer: Self-pay | Admitting: Nurse Practitioner

## 2016-12-08 DIAGNOSIS — K7469 Other cirrhosis of liver: Secondary | ICD-10-CM

## 2016-12-09 ENCOUNTER — Encounter: Payer: Self-pay | Admitting: Internal Medicine

## 2016-12-11 ENCOUNTER — Ambulatory Visit
Admission: RE | Admit: 2016-12-11 | Discharge: 2016-12-11 | Disposition: A | Discharge status: Home / Self Care | Payer: Commercial Managed Care - HMO | Source: Ambulatory Visit | Attending: Nurse Practitioner | Admitting: Nurse Practitioner

## 2016-12-11 DIAGNOSIS — K7469 Other cirrhosis of liver: Secondary | ICD-10-CM

## 2017-01-27 ENCOUNTER — Ambulatory Visit (INDEPENDENT_AMBULATORY_CARE_PROVIDER_SITE_OTHER): Payer: 59 | Admitting: Gastroenterology

## 2017-01-27 ENCOUNTER — Encounter: Payer: Self-pay | Admitting: Adult Health

## 2017-01-27 ENCOUNTER — Encounter: Payer: Self-pay | Admitting: Gastroenterology

## 2017-01-27 ENCOUNTER — Ambulatory Visit (INDEPENDENT_AMBULATORY_CARE_PROVIDER_SITE_OTHER): Payer: 59 | Admitting: Adult Health

## 2017-01-27 VITALS — BP 116/75 | HR 66 | Temp 97.2°F | Ht 65.0 in | Wt 156.4 lb

## 2017-01-27 VITALS — BP 112/78 | HR 70 | Ht 65.0 in | Wt 159.0 lb

## 2017-01-27 DIAGNOSIS — Z1211 Encounter for screening for malignant neoplasm of colon: Secondary | ICD-10-CM | POA: Diagnosis not present

## 2017-01-27 DIAGNOSIS — Z1212 Encounter for screening for malignant neoplasm of rectum: Secondary | ICD-10-CM

## 2017-01-27 DIAGNOSIS — R319 Hematuria, unspecified: Secondary | ICD-10-CM | POA: Diagnosis not present

## 2017-01-27 DIAGNOSIS — R1030 Lower abdominal pain, unspecified: Secondary | ICD-10-CM

## 2017-01-27 DIAGNOSIS — L280 Lichen simplex chronicus: Secondary | ICD-10-CM

## 2017-01-27 DIAGNOSIS — K649 Unspecified hemorrhoids: Secondary | ICD-10-CM

## 2017-01-27 DIAGNOSIS — Z8619 Personal history of other infectious and parasitic diseases: Secondary | ICD-10-CM | POA: Diagnosis not present

## 2017-01-27 DIAGNOSIS — R109 Unspecified abdominal pain: Secondary | ICD-10-CM | POA: Insufficient documentation

## 2017-01-27 DIAGNOSIS — N898 Other specified noninflammatory disorders of vagina: Secondary | ICD-10-CM

## 2017-01-27 DIAGNOSIS — Z01411 Encounter for gynecological examination (general) (routine) with abnormal findings: Secondary | ICD-10-CM | POA: Diagnosis not present

## 2017-01-27 DIAGNOSIS — L298 Other pruritus: Secondary | ICD-10-CM | POA: Diagnosis not present

## 2017-01-27 DIAGNOSIS — R102 Pelvic and perineal pain: Secondary | ICD-10-CM

## 2017-01-27 DIAGNOSIS — Z01419 Encounter for gynecological examination (general) (routine) without abnormal findings: Secondary | ICD-10-CM

## 2017-01-27 LAB — POCT URINALYSIS DIPSTICK
Glucose, UA: NEGATIVE
Ketones, UA: NEGATIVE
Leukocytes, UA: NEGATIVE
Nitrite, UA: NEGATIVE
PROTEIN UA: NEGATIVE

## 2017-01-27 LAB — HEMOCCULT GUIAC POC 1CARD (OFFICE): FECAL OCCULT BLD: NEGATIVE

## 2017-01-27 MED ORDER — HYDROCORTISONE 2.5 % RE CREA: 1.0000 "application " | TOPICAL_CREAM | Freq: Two times a day (BID) | RECTAL | 0 refills | Status: DC

## 2017-01-27 MED ORDER — CLOBETASOL PROPIONATE 0.05 % EX OINT
TOPICAL_OINTMENT | CUTANEOUS | 2 refills | Status: DC
Start: 2017-01-27 — End: 2017-09-28

## 2017-01-27 NOTE — Patient Instructions (Signed)
1. Use anusol cream twice daily just inside the anal canal for 2-3 weeks. Call if persistent discharge noted.  2. I will discuss with Ariana Locks, NP if you can return to [place] for ongoing management of your liver.  3. I will follow up results from your ultrasound as available.

## 2017-01-27 NOTE — Patient Instructions (Addendum)
Physical in 1 year Pap 2020 Mammogram yearly Labs with [facility] Colonoscopy per GI [place] in 1 week F/U with me in 6 weeks

## 2017-01-27 NOTE — Progress Notes (Signed)
Patient ID: Ariana Long, female   DOB: [birth year], 61 y.o.   MRN: [id number] History of Present Illness: Ariana Long is a 61 years old black female in for well woman gyn exam, she had normal pap with negative HPV 12/17/15. PCP is [place].    Current Medications, Allergies, Past Medical History, Past Surgical History, Family History and Social History were reviewed in [employer] record.     Review of Systems: Patient denies any headaches, hearing loss, fatigue, blurred vision, shortness of breath, chest pain,  problems with bowel movements, urination, or intercourse. No joint pain or mood swings. +pain low abdomen, +vaginal itching    Physical Exam:BP 112/78 (BP Location: Left Arm, Patient Position: Sitting, Cuff Size: Normal)   Pulse 70   Ht 5\' 5"  (1.651 m)   Wt 159 lb (72.1 kg)   BMI 26.46 kg/m urine 3+blood General:  Well developed, well nourished, no acute distress Skin:  Warm and dry Neck:  Midline trachea, normal thyroid, good ROM, no lymphadenopathy,no carotid bruits heard Lungs; Clear to auscultation bilaterally Breast:  No dominant palpable mass, retraction, or nipple discharge Cardiovascular: Regular rate and rhythm Abdomen:  Soft, non tender, no hepatosplenomegaly Pelvic:  External genitalia has thickened red tissue on labia.  The vagina is normal in appearance. Urethra has no lesions or masses. The cervix is smooth.  Uterus is felt to be normal size, shape, and contour.  No adnexal masses or tenderness noted.Bladder is non tender, no masses felt. Rectal: Good sphincter tone, no polyps, or hemorrhoids felt.  Hemoccult negative. Extremities/musculoskeletal:  No swelling or varicosities noted, no clubbing or cyanosis Psych:  No mood changes, alert and cooperative,seems happy PHQ 2 score 0.  Impression: 1. Encounter for well woman exam with routine gynecological exam   2. Pelvic pain   3. Lichenification and lichen simplex chronicus   4. Hematuria,  unspecified type   5. Itching in the vaginal area   6. Screening for colorectal cancer       Plan: GYN [place] in 1 week  UA C&S sent Meds ordered this encounter  Medications  . clobetasol ointment (TEMOVATE) 0.05 %    Sig: Use 2-3 x weekly    Dispense:  30 g    Refill:  2    Order Specific Question:   Supervising Provider    Answer:   Tania Ade H [[id number]]  F/U in 6  Weeks with me Physical in 1 year Pap in 2020 Mammogram yearly Colonoscopy per GI

## 2017-01-27 NOTE — Progress Notes (Signed)
Primary Care Physician: Sharilyn Sites, MD  Primary Gastroenterologist:  Garfield Cornea, MD   Chief Complaint  Patient presents with  . Abdominal Pain  . discharge from rectum    with odor  . Anal Itching    HPI: Ariana Long is a 61 y.o. female here for f/u. Last seen in 12/2015.   Colonoscopy and endoscopy completed 08/29/2015. Colonoscopy noted a single 12 mm polyp in the ascending colon, 4 mm polyp in the ascending colon, otherwise normal exam. Surgical pathology found the polyps to be sessile serrated adenoma. EGD found noncritical Schatzki's ring, somewhat prominent small submucosal vessels noted without obvious varices. Single gastric polyp status post biopsy. Polyp found to be hyperplastic. Recommended repeat colonoscopy in 3 years for surveillance.  Continues to follow up hepatitis clinic in [place]. Saw Dawn Drazek  on 12/07/2016. She has a history of hepatitis C status post eradication. Prior MRI with nodular liver border, F3/F4 on Fibrosure. Requiring lifelong hepatoma screening.  Patient presents to discuss rectal bleeding and perianal itching. Occasional bright red blood on the toilet tissue. She feels like a leakage from her rectum which causes itching. Has about 3 stools per day. For 2 months she's had some abdominal discomfort in the lower abdomen when she bends over. Not related to meals or bowel movements. Evaluated by gynecologist today, patient reports unremarkable pelvic exam. Pelvic ultrasound scheduled for next week to evaluate her abdominal pain. Rectal exam showed no abnormalities by the gynecologist. She was heme-negative.  Of note, patient underwent hemorrhoid banding 1 in September 2015. She had quite a bit of discomfort with initial banding, and had to be loosened, it was fairly loose. On follow-up she opted not to have any additional bands placed.   Current Outpatient Prescriptions  Medication Sig Dispense Refill  . Cholecalciferol (VITAMIN D-3  PO) Take 4,000 Units by mouth daily.    . clobetasol ointment (TEMOVATE) 0.05 % Use 2-3 x weekly 30 g 2  . Cyanocobalamin (VITAMIN B-12 PO) Take 1 tablet by mouth daily.    Marland Kitchen MILK THISTLE PO Take 1 tablet by mouth daily.    . Multiple Vitamin (MULTIVITAMIN) tablet Take 1 tablet by mouth daily.    [name] 3-6-9 Fatty Acids CAPS Take 1 capsule by mouth daily.     . Selenium 100 MCG TABS Take by mouth daily.    . vitamin E 400 UNIT capsule Take 400 Units by mouth daily.     No current facility-administered medications for this visit.     Allergies as of 01/27/2017  . (No Known Allergies)    ROS:  General: Negative for anorexia, weight loss, fever, chills, fatigue, weakness. ENT: Negative for hoarseness, difficulty swallowing , nasal congestion. CV: Negative for chest pain, angina, palpitations, dyspnea on exertion, peripheral edema.  Respiratory: Negative for dyspnea at rest, dyspnea on exertion, cough, sputum, wheezing.  GI: See history of present illness. GU:  Negative for dysuria, hematuria, urinary incontinence, urinary frequency, nocturnal urination.  Endo: Negative for unusual weight change.    Physical Examination:   BP 116/75   Pulse 66   Temp (!) 97.2 F (36.2 C) (Oral)   Ht 5\' 5"  (1.651 m)   Wt 156 lb 6.4 oz (70.9 kg)   BMI 26.03 kg/m   General: Well-nourished, well-developed in no acute distress.  Eyes: No icterus. Mouth: Oropharyngeal mucosa moist and pink , no lesions erythema or exudate. Lungs: Clear to auscultation bilaterally.  Heart: Regular rate and rhythm,  no murmurs rubs or gallops.  Abdomen: Bowel sounds are normal, nontender, nondistended, no hepatosplenomegaly or masses, no abdominal bruits or hernia , no rebound or guarding.   Extremities: No lower extremity edema. No clubbing or deformities. Neuro: Alert and oriented x 4   Skin: Warm and dry, no jaundice.   Psych: Alert and cooperative, normal mood and affect.   Imaging Studies: No results  found.

## 2017-01-28 ENCOUNTER — Other Ambulatory Visit (HOSPITAL_COMMUNITY): Payer: Self-pay | Admitting: Family Medicine

## 2017-01-28 DIAGNOSIS — Z1231 Encounter for screening mammogram for malignant neoplasm of breast: Secondary | ICD-10-CM

## 2017-01-28 LAB — URINALYSIS, ROUTINE W REFLEX MICROSCOPIC
Bilirubin, UA: NEGATIVE
Glucose, UA: NEGATIVE
KETONES UA: NEGATIVE
NITRITE UA: NEGATIVE
PH UA: 5.5 (ref 5.0–7.5)
Protein, UA: NEGATIVE
Specific Gravity, UA: 1.019 (ref 1.005–1.030)
Urobilinogen, Ur: 0.2 mg/dL (ref 0.2–1.0)

## 2017-01-28 LAB — MICROSCOPIC EXAMINATION: CASTS: NONE SEEN /LPF

## 2017-01-29 ENCOUNTER — Telehealth: Payer: Self-pay | Admitting: Adult Health

## 2017-01-29 LAB — URINE CULTURE

## 2017-01-29 MED ORDER — SULFAMETHOXAZOLE-TRIMETHOPRIM 800-160 MG PO TABS: 1.0000 | ORAL_TABLET | Freq: Two times a day (BID) | ORAL | 0 refills | Status: DC

## 2017-01-29 NOTE — Assessment & Plan Note (Signed)
Status post eradication. Continues to follow down in [place] wants to go she can continue her care locally. Will address with Roosevelt Locks, NP at [facility] liver care.

## 2017-01-29 NOTE — Assessment & Plan Note (Signed)
Vague abdominal discomfort with bending over. Pelvic ultrasound planned. Follow-up on results. May or may not need additional workup based on findings.

## 2017-01-29 NOTE — Telephone Encounter (Signed)
Left message that urine +Ecoli and rx for septra ds sent to [facility]

## 2017-01-29 NOTE — Assessment & Plan Note (Signed)
Suspect her anorectal issues are related to persistent hemorrhoids. She did not tolerate hemorrhoid banding very well. Rectal exam today by her gynecologist, heme-negative. No obvious abnormalities. Patient's last colonoscopy April 2017 as outlined above. [place] with topical regimen at this time. Anusol cream twice a day for 2-3 weeks.

## 2017-02-01 ENCOUNTER — Ambulatory Visit (HOSPITAL_COMMUNITY): Payer: 59

## 2017-02-01 NOTE — Progress Notes (Signed)
cc'd to pcp 

## 2017-02-02 ENCOUNTER — Ambulatory Visit (INDEPENDENT_AMBULATORY_CARE_PROVIDER_SITE_OTHER): Payer: 59

## 2017-02-02 DIAGNOSIS — R102 Pelvic and perineal pain: Secondary | ICD-10-CM | POA: Diagnosis not present

## 2017-02-02 NOTE — Progress Notes (Signed)
PELVIC US TA/TV: homogeneous anteverted uterus,wnl,EEC 3 mm,normal ovaries bilat,difficult to slide ovaries because of bilat pelvic pain,no free fluid

## 2017-02-03 ENCOUNTER — Telehealth: Payer: Self-pay | Admitting: Adult Health

## 2017-02-03 NOTE — Telephone Encounter (Signed)
Left message that GYN [place] was normal

## 2017-02-08 ENCOUNTER — Ambulatory Visit (HOSPITAL_COMMUNITY)
Admission: RE | Admit: 2017-02-08 | Discharge: 2017-02-08 | Disposition: A | Discharge status: Home / Self Care | Payer: 59 | Source: Ambulatory Visit | Attending: Family Medicine | Admitting: Family Medicine

## 2017-02-08 ENCOUNTER — Encounter (HOSPITAL_COMMUNITY): Payer: Self-pay

## 2017-02-08 DIAGNOSIS — Z1231 Encounter for screening mammogram for malignant neoplasm of breast: Secondary | ICD-10-CM | POA: Diagnosis not present

## 2017-02-10 ENCOUNTER — Telehealth: Payer: Self-pay | Admitting: Gastroenterology

## 2017-02-10 NOTE — Telephone Encounter (Signed)
Please find out if patient still needs to follow up with [facility] liver care or if her care can be released to [place]. Patient was wondering due to transportation.   Please let patient know that I reviewed her pelvic u/s. Nothing to explain her pain. Would f/u with gyn as planned.   F/u here in 06/2017.

## 2017-02-18 ENCOUNTER — Encounter: Payer: Self-pay | Admitting: Internal Medicine

## 2017-02-18 NOTE — Telephone Encounter (Signed)
PATIENT SCHEDULED AND ON RECALL  °

## 2017-02-18 NOTE — Telephone Encounter (Signed)
I transferred a call from [facility] liver clinic to [facility] so she can speak with [facility] about f/u on [name].

## 2017-02-18 NOTE — Telephone Encounter (Signed)
Patient made aware of Leslie's recommendations.  She stated she was unsure if Dawn was releasing her or not.  She has an appointment to see her GYN provider on 03/09/17 and will ask about pelvic pain.

## 2017-02-18 NOTE — Telephone Encounter (Signed)
Spoke to Ariana Locks, NP at [facility] liver care. Ok to release patient completely back to [place]. Next RUQ u/s for hepatoma screening due in 06/2017. Please NIC.   She will need to come see [place] in 06/2017 for f/u cirrhosis.

## 2017-03-09 ENCOUNTER — Ambulatory Visit: Payer: 59 | Admitting: Adult Health

## 2017-03-16 ENCOUNTER — Encounter: Payer: Self-pay | Admitting: Adult Health

## 2017-03-16 ENCOUNTER — Other Ambulatory Visit: Payer: Self-pay

## 2017-03-16 ENCOUNTER — Ambulatory Visit: Payer: 59 | Admitting: Adult Health

## 2017-03-16 VITALS — BP 102/70 | HR 62 | Resp 16 | Ht 65.0 in | Wt 156.5 lb

## 2017-03-16 DIAGNOSIS — L280 Lichen simplex chronicus: Secondary | ICD-10-CM

## 2017-03-16 NOTE — Progress Notes (Signed)
Subjective:     Patient ID: [name], female   DOB: March 02, 1956, 61 y.o.   MRN: [id number]  HPI Nakkia is a 61 year old black female back in follow up on lichen simplex.  Review of Systems +itches if misses cream Has gray discharge in creases in am Reviewed past medical,surgical, social and family history. Reviewed medications and allergies.     Objective:   Physical Exam BP 102/70 (BP Location: Right Arm, Patient Position: Sitting, Cuff Size: Normal)   Pulse 62   Resp 16   Ht 5\' 5"  (1.651 m)   Wt 156 lb 8 oz (71 kg)   BMI 26.04 kg/m    Skin warm and dry.Pelvic: external genitalia has mositure like changes on vulva, no lesions, no discharge in folds of labia.   Assessment:     1. Lichenification and lichen simplex chronicus       Plan:     Continue temovate 2-3 x weekly F/U prn Call with discharge

## 2017-03-16 NOTE — Patient Instructions (Signed)
Continue using temovate 2-3 x weekly

## 2017-03-30 ENCOUNTER — Telehealth: Payer: Self-pay | Admitting: Adult Health

## 2017-03-30 NOTE — Telephone Encounter (Signed)
Patient called stating she is having the gray discharge, slight odor and some itching yesterday but not today. Was advised to call us if it returned. Please advise.

## 2017-03-30 NOTE — Telephone Encounter (Signed)
Left message I called, and that would like to see when has discharge

## 2017-04-01 ENCOUNTER — Ambulatory Visit: Payer: 59 | Admitting: Adult Health

## 2017-04-08 ENCOUNTER — Ambulatory Visit: Payer: 59 | Admitting: Adult Health

## 2017-04-08 ENCOUNTER — Encounter: Payer: Self-pay | Admitting: Adult Health

## 2017-04-08 VITALS — BP 130/90 | HR 93 | Ht 65.0 in | Wt 157.0 lb

## 2017-04-08 DIAGNOSIS — Z87440 Personal history of urinary (tract) infections: Secondary | ICD-10-CM

## 2017-04-08 DIAGNOSIS — B379 Candidiasis, unspecified: Secondary | ICD-10-CM | POA: Diagnosis not present

## 2017-04-08 DIAGNOSIS — N898 Other specified noninflammatory disorders of vagina: Secondary | ICD-10-CM | POA: Diagnosis not present

## 2017-04-08 LAB — POCT URINALYSIS DIPSTICK
Blood, UA: NEGATIVE
Glucose, UA: NEGATIVE
LEUKOCYTES UA: NEGATIVE
Nitrite, UA: NEGATIVE
Protein, UA: NEGATIVE

## 2017-04-08 LAB — POCT WET PREP (WET MOUNT)

## 2017-04-08 MED ORDER — FLUCONAZOLE 100 MG PO TABS: ORAL_TABLET | ORAL | 0 refills | Status: DC

## 2017-04-08 NOTE — Progress Notes (Signed)
Subjective:     Patient ID: Ariana Long, female   DOB: 1956/04/08, 61 y.o.   MRN: [id number]  HPI Ariana Long is a 61 years old black female with history of LSA and complains of grayish discharge in labial creases, and outside itch, none inside.   Review of Systems +gray discharge in labial creases, and itches outside Reviewed past medical,surgical, social and family history. Reviewed medications and allergies.     Objective:   Physical Exam BP 130/90 (BP Location: Left Arm, Patient Position: Sitting, Cuff Size: Normal)   Pulse 93   Ht 5\' 5"  (1.651 m)   Wt 157 lb (71.2 kg)   BMI 26.13 kg/m urine dipstick negative. Skin warm and dry.Pelvic: external genitalia is normal in appearance, but has grayish discharge in labial creases. Wet prep: + for yeast.Painted labia with gentian violet.     Assessment:     1. Yeast infection   2. Vaginal discharge   3. Itching in the vaginal area   4. History of UTI       Plan:     UA C&S sent Meds ordered this encounter  Medications  . fluconazole (DIFLUCAN) 100 MG tablet    Sig: Take 1 daily for 10 days    Dispense:  10 tablet    Refill:  0    Order Specific Question:   Supervising Provider    Answer:   Tania Ade H [[id number]]    F/U prn

## 2017-04-09 LAB — URINALYSIS, ROUTINE W REFLEX MICROSCOPIC
BILIRUBIN UA: NEGATIVE
GLUCOSE, UA: NEGATIVE
KETONES UA: NEGATIVE
Leukocytes, UA: NEGATIVE
NITRITE UA: NEGATIVE
Protein, UA: NEGATIVE
RBC UA: NEGATIVE
Specific Gravity, UA: 1.026 (ref 1.005–1.030)
Urobilinogen, Ur: 0.2 mg/dL (ref 0.2–1.0)
pH, UA: 5 (ref 5.0–7.5)

## 2017-04-10 LAB — URINE CULTURE

## 2017-04-14 ENCOUNTER — Telehealth: Payer: Self-pay | Admitting: Adult Health

## 2017-04-14 MED ORDER — NITROFURANTOIN MONOHYD MACRO 100 MG PO CAPS
100.0000 mg | ORAL_CAPSULE | Freq: Two times a day (BID) | ORAL | 0 refills | Status: DC
Start: 2017-04-14 — End: 2017-07-28

## 2017-04-14 NOTE — Telephone Encounter (Signed)
Pt aware urine +E Coli, will Rx Macrobid, when off meds for about 4 days, drop urine off will get culture to make sure it is gone.And she feels much better, where treated for yeast.

## 2017-04-15 ENCOUNTER — Encounter: Payer: Self-pay | Admitting: Gastroenterology

## 2017-04-15 ENCOUNTER — Ambulatory Visit: Payer: 59 | Admitting: Gastroenterology

## 2017-04-15 ENCOUNTER — Telehealth: Payer: Self-pay | Admitting: Gastroenterology

## 2017-04-15 DIAGNOSIS — R198 Other specified symptoms and signs involving the digestive system and abdomen: Secondary | ICD-10-CM

## 2017-04-15 NOTE — Telephone Encounter (Signed)
Spoke with pt and she is going to talk things over with her spouse and call back.

## 2017-04-15 NOTE — Progress Notes (Signed)
Referring Provider: Sharilyn Sites, MD Primary Care Physician:  Sharilyn Sites, MD Primary GI: Dr. Gala Romney   Chief Complaint  Patient presents with  . rectal discharge    clear, has odor  . Anal Itching    HPI:   Ariana Long is a 61 y.o. female presenting today with a history of Hepatitis C, s/p eradication. Prior MRI with nodular liver border, F3/F4 on Fibrosure. Colonoscopy and endoscopy completed 08/29/2015. Colonoscopy noted a single 12 mm polyp in the ascending colon, 4 mm polyp in the ascending colon, otherwise normal exam. Surgical pathology found the polyps to be sessile serrated adenoma. EGD found noncritical Schatzki's ring, somewhat prominent small submucosal vessels noted without obvious varices. Single gastric polyp status post biopsy. Polyp found to be hyperplastic. Recommended repeat colonoscopy in 3 years for surveillance and EGD in 2019 for surveillance. US abdomen for surveillance needed in Feb 2019. Tried to have one outpatient hemorrhoid banding in 2015 and only tolerated one due to discomfort.  Wiping rectum will have mucus/liquid type of discharge. Sometimes has low-volume hematochezia. Believes the discharge has an odor. Occasional itching. Anusol cream burned on outside. No rectal discomfort with BMs. Has 3 soft BMs a day, no straining. Benefiber daily. Abdominal pain improved after given medication for yeast, UTI.   Past Medical History:  Diagnosis Date  . Cirrhosis of liver ([place]) 01/26/2011   FIBROSIS on liver biopsy in 2007, U/S  03/31/2013= mild diffuse hepatic steatosis and /or hepatocellular disease without focal hepatic parenchymal abnormality. per Dr. Patsy Baltimore (2012) pt is immune to hep A and Hep B. per 12/06/13 note from Liver clinic-pt had MRI which showed cirrhosis 2015.  Marland Kitchen Cirrhosis of liver ([place])   . Diverticulitis of colon   . Elevated AFP 01/26/2011  . Hematuria 12/12/2014  . Hemorrhoid 12/01/2012  . Hemorrhoids   . Hepatitis C 01/26/2011   2005 non  responder, genotype 1, Gr 1-2, Stage 3 . Treated with Harvoni at the Liver clinic, responder  . Hidradenitis   . Hx: UTI (urinary tract infection)   . Kidney stones 10/2011  . Osteoporosis   . Serrated adenoma of colon 11/2011  . Splenomegaly 01/26/2011  . Thrombocytopenia ([place]) 01/26/2011  . Tubular adenoma   . Vulval lesion 12/14/2013   Has kissing lesions thin and puffy and paler i color about 5-6- mm in diameter.Dr Glo Herring in to co examine will try temovate and recheck in 3 months    Past Surgical History:  Procedure Laterality Date  . axillary gland removal  1979   bilateral  . BREAST BIOPSY  2011   Right  . BREAST EXCISIONAL BIOPSY Bilateral 25 years ago   Benign, fatty tumors  . CESAREAN SECTION     x 2  . COLONOSCOPY  07/31/2005   Rourk-Normal rectum/Normal colonoscopy/Repeat screening colonoscopy ten years  . COLONOSCOPY  11/16/2011   RMR: Friable anorectum-likely source of hematochezia/ LARGE 1.2x2.5CM SERRATED ADENOMA resected from ascending colon 7CM DISTAL TO ICV, hyperplastic polyp removed   . COLONOSCOPY N/A 07/18/2012   Dr. Gala Romney- normal appearing rectal mucosa. no residual polpy tissue seen at tatoo location. remainder of colonic mucosa appeared entirely normal. bx = tubular adenoma  . COLONOSCOPY N/A 08/29/2015   Dr. Gala Romney: single 12 mm polyp in ascending colon, 4 mm polyp ascending. Sessile serrated adenoma. 3 year surveillance  . ESOPHAGEAL BANDING N/A 08/29/2015   Procedure: ESOPHAGEAL BANDING;  Surgeon: Daneil Dolin, MD;  Location: [facility];  Service: Endoscopy;  Laterality:  N/A;  . ESOPHAGOGASTRODUODENOSCOPY N/A 08/29/2015   Dr. Gala Romney: non-critical Schatzki's ring, somewhat prominent small submucosal vessels without obvious varices. hyperplastic gastric polyp. 2 year surveillance  . HEMORRHOID BANDING    . SKIN LESION EXCISION      Current Outpatient Medications  Medication Sig Dispense Refill  . Cholecalciferol (VITAMIN D-3 PO) Take 4,000 Units by mouth  daily.    . clobetasol ointment (TEMOVATE) 0.05 % Use 2-3 x weekly 30 g 2  . Cyanocobalamin (VITAMIN B-12 PO) Take 1 tablet by mouth daily.    . fluconazole (DIFLUCAN) 100 MG tablet Take 1 daily for 10 days 10 tablet 0  . hydrocortisone (ANUSOL-HC) 2.5 % rectal cream Place 1 application rectally 2 (two) times daily. (Patient taking differently: Place 1 application rectally as needed. ) 30 g 0  . MILK THISTLE PO Take 1 tablet by mouth daily.    . Multiple Vitamin (MULTIVITAMIN) tablet Take 1 tablet by mouth daily.    . nitrofurantoin, macrocrystal-monohydrate, (MACROBID) 100 MG capsule Take 1 capsule (100 mg total) by mouth 2 (two) times daily. 14 capsule 0  . Omega 3-6-9 Fatty Acids CAPS Take 1 capsule by mouth daily.     . Selenium 100 MCG TABS Take by mouth daily.    . vitamin E 400 UNIT capsule Take 400 Units by mouth daily.     No current facility-administered medications for this visit.     Allergies as of 04/15/2017  . (No Known Allergies)    Family History  Problem Relation Age of Onset  . Heart attack Mother   . Stroke Mother   . Aneurysm Father   . Colon cancer Neg Hx     Social History   Socioeconomic History  . Marital status: Married    Spouse name: None  . Number of children: 2  . Years of education: None  . Highest education level: None  Social Needs  . Financial resource strain: None  . Food insecurity - worry: None  . Food insecurity - inability: None  . Transportation needs - medical: None  . Transportation needs - non-medical: None  Occupational History  . Occupation: Occupational psychologist: [employer]  Tobacco Use  . Smoking status: Never Smoker  . Smokeless tobacco: Never Used  Substance and Sexual Activity  . Alcohol use: No  . Drug use: No  . Sexual activity: Yes    Birth control/protection: Post-menopausal  Other Topics Concern  . None  Social History Narrative  . None    Review of Systems: Gen: Denies fever, chills, anorexia.  Denies fatigue, weakness, weight loss.  CV: Denies chest pain, palpitations, syncope, peripheral edema, and claudication. Resp: Denies dyspnea at rest, cough, wheezing, coughing up blood, and pleurisy. GI: see HPI  Derm: Denies rash, itching, dry skin Psych: Denies depression, anxiety, memory loss, confusion. No homicidal or suicidal ideation.  Heme: see HPI   Physical Exam: BP 125/82   Pulse 69   Temp (!) 97 F (36.1 C) (Oral)   Ht 5\' 5"  (1.651 m)   Wt 156 lb 3.2 oz (70.9 kg)   BMI 25.99 kg/m  General:   Alert and oriented. No distress noted. Pleasant and cooperative.  Head:  Normocephalic and atraumatic. Eyes:  Conjuctiva clear without scleral icterus. Mouth:  Oral mucosa pink and moist.  Abdomen:  +BS, soft, non-tender and non-distended. No rebound or guarding. No HSM or masses noted. Rectal: no obvious anal fissure, fistula. Mild external hemorrhoid. No internal exam.  She did have a fissure at the uppermost part of the intergluteal cleft from chronic moisture/skin breakdown Msk:  Symmetrical without gross deformities. Normal posture. Extremities:  Without edema. Neurologic:  Alert and  oriented x4 Psych:  Alert and cooperative. Normal mood and affect.

## 2017-04-15 NOTE — Patient Instructions (Signed)
I am going to determine the best type of evaluation (imaging) and have our office call you today or tomorrow.  We will see you back in Jan 2019. At that time, we can discuss a flexible sigmoidoscopy versus a complete colonoscopy. You will also be due for your upper endoscopy next year.   Further recommendations shortly to follow!

## 2017-04-15 NOTE — Telephone Encounter (Signed)
After further review of chart after patient left, please let her know that I feel the best route to take would be at a minimum a flexible sigmoidoscopy. Colonoscopy could be completed as well. Flex sig will only see the rectum and sigmoid, but with her symptoms, she needs evaluation endoscopically. Is she willing to pursue? I don't think imaging will be helpful in this case.

## 2017-04-15 NOTE — Telephone Encounter (Signed)
Pt called back around 1:53 pm and said she does what to do what your recommending.

## 2017-04-16 NOTE — Telephone Encounter (Signed)
Routing message to schedule TCS. Spoke with pt on yesterday and she is aware of the recommendations.

## 2017-04-16 NOTE — Telephone Encounter (Signed)
At this point, since she has a history of large polyps, would be best to just do a complete colonoscopy now. I know it's a little early, but she is having issues and this would be the most thorough route to take.

## 2017-04-16 NOTE — Assessment & Plan Note (Signed)
61 year old female with history of large polyps on several prior occasions (most recently in April 2017 with surveillance due in 2020), presenting with low-volume hematochezia, reports of persistent clear rectal discharge with odor. She has been treated empirically for internal hemorrhoids without improvement, and she underwent one hemorrhoid banding in 2015 but did not complete additional banding due to discomfort. We discussed a flex sig vs complete colonoscopy, as she recently had a colonoscopy. However, with her history of larger polyps and now with these symptoms, I feel a colonoscopy would not be unreasonable for complete evaluation of the colon instead of only limited via flex sig. As of note, I did not appreciate any concerning abnormalities on external rectal exam.   Proceed with TCS with Dr. Gala Romney in near future: the risks, benefits, and alternatives have been discussed with the patient in detail. The patient states understanding and desires to proceed.

## 2017-04-19 ENCOUNTER — Other Ambulatory Visit: Payer: Self-pay | Admitting: *Deleted

## 2017-04-19 ENCOUNTER — Encounter: Payer: Self-pay | Admitting: *Deleted

## 2017-04-19 DIAGNOSIS — Z86010 Personal history of colonic polyps: Secondary | ICD-10-CM

## 2017-04-19 DIAGNOSIS — R198 Other specified symptoms and signs involving the digestive system and abdomen: Secondary | ICD-10-CM

## 2017-04-19 MED ORDER — NA SULFATE-K SULFATE-MG SULF 17.5-3.13-1.6 GM/177ML PO SOLN: 1.0000 | ORAL | 0 refills | Status: DC

## 2017-04-19 NOTE — Telephone Encounter (Signed)
Per Ariana Long pt should be fine just normal without propofol.

## 2017-04-19 NOTE — Telephone Encounter (Signed)
PA for TCS was approved via [facility] website. Auth # [id number].

## 2017-04-19 NOTE — Telephone Encounter (Signed)
Spoke with pt and she is scheduled for TCS w/ RMR on 05/19/17 at 8:30am. Patient aware I have sent prep to the pharmacy and I have mailed prep instructions to her. Nothing further needed

## 2017-04-19 NOTE — Progress Notes (Signed)
cc'ed to pcp °

## 2017-04-30 ENCOUNTER — Other Ambulatory Visit: Payer: 59

## 2017-04-30 DIAGNOSIS — Z87440 Personal history of urinary (tract) infections: Secondary | ICD-10-CM

## 2017-05-02 LAB — URINE CULTURE

## 2017-05-03 ENCOUNTER — Telehealth: Payer: Self-pay | Admitting: Adult Health

## 2017-05-03 MED ORDER — SULFAMETHOXAZOLE-TRIMETHOPRIM 800-160 MG PO TABS: 1.0000 | ORAL_TABLET | Freq: Two times a day (BID) | ORAL | 0 refills | Status: DC

## 2017-05-03 NOTE — Telephone Encounter (Signed)
Pt  Aware urine +E coli, rx septra ds

## 2017-05-11 ENCOUNTER — Telehealth: Payer: Self-pay | Admitting: Internal Medicine

## 2017-05-11 NOTE — Telephone Encounter (Signed)
RECALL FOR ABD ULTRASOUND 

## 2017-05-11 NOTE — Telephone Encounter (Signed)
Letter mailed to pt.  

## 2017-05-17 ENCOUNTER — Ambulatory Visit: Payer: 59 | Admitting: Gastroenterology

## 2017-05-19 ENCOUNTER — Ambulatory Visit (HOSPITAL_COMMUNITY)
Admission: RE | Admit: 2017-05-19 | Discharge: 2017-05-19 | Disposition: A | Discharge status: Home / Self Care | Payer: 59 | Source: Ambulatory Visit | Attending: Internal Medicine | Admitting: Internal Medicine

## 2017-05-19 ENCOUNTER — Other Ambulatory Visit: Payer: Self-pay

## 2017-05-19 ENCOUNTER — Encounter (HOSPITAL_COMMUNITY): Payer: Self-pay | Admitting: *Deleted

## 2017-05-19 ENCOUNTER — Encounter (HOSPITAL_COMMUNITY)
Admission: RE | Disposition: A | Discharge status: Home / Self Care | Payer: Self-pay | Source: Ambulatory Visit | Attending: Internal Medicine

## 2017-05-19 DIAGNOSIS — K921 Melena: Secondary | ICD-10-CM | POA: Diagnosis not present

## 2017-05-19 DIAGNOSIS — K7460 Unspecified cirrhosis of liver: Secondary | ICD-10-CM | POA: Diagnosis not present

## 2017-05-19 DIAGNOSIS — K5732 Diverticulitis of large intestine without perforation or abscess without bleeding: Secondary | ICD-10-CM | POA: Insufficient documentation

## 2017-05-19 DIAGNOSIS — Z87440 Personal history of urinary (tract) infections: Secondary | ICD-10-CM | POA: Insufficient documentation

## 2017-05-19 DIAGNOSIS — Z79899 Other long term (current) drug therapy: Secondary | ICD-10-CM | POA: Insufficient documentation

## 2017-05-19 DIAGNOSIS — Z86010 Personal history of colonic polyps: Secondary | ICD-10-CM | POA: Insufficient documentation

## 2017-05-19 DIAGNOSIS — C184 Malignant neoplasm of transverse colon: Secondary | ICD-10-CM

## 2017-05-19 DIAGNOSIS — C183 Malignant neoplasm of hepatic flexure: Secondary | ICD-10-CM | POA: Insufficient documentation

## 2017-05-19 DIAGNOSIS — B1920 Unspecified viral hepatitis C without hepatic coma: Secondary | ICD-10-CM | POA: Diagnosis not present

## 2017-05-19 DIAGNOSIS — R198 Other specified symptoms and signs involving the digestive system and abdomen: Secondary | ICD-10-CM

## 2017-05-19 DIAGNOSIS — Z87442 Personal history of urinary calculi: Secondary | ICD-10-CM | POA: Diagnosis not present

## 2017-05-19 HISTORY — PX: POLYPECTOMY: SHX5525

## 2017-05-19 HISTORY — PX: COLONOSCOPY: SHX5424

## 2017-05-19 SURGERY — COLONOSCOPY
Anesthesia: Moderate Sedation

## 2017-05-19 MED ORDER — MEPERIDINE HCL 100 MG/ML IJ SOLN
INTRAMUSCULAR | Status: AC
  Filled 2017-05-19: qty 2

## 2017-05-19 MED ORDER — SPOT INK MARKER SYRINGE KIT
PACK | SUBMUCOSAL | Status: AC
  Filled 2017-05-19: qty 5

## 2017-05-19 MED ORDER — SODIUM CHLORIDE 0.9 % IV SOLN
INTRAVENOUS | Status: DC
  Administered 2017-05-19: 08:00:00 via INTRAVENOUS

## 2017-05-19 MED ORDER — ONDANSETRON HCL 4 MG/2ML IJ SOLN
INTRAMUSCULAR | Status: DC | PRN
  Administered 2017-05-19: 4 mg via INTRAVENOUS

## 2017-05-19 MED ORDER — MIDAZOLAM HCL 5 MG/5ML IJ SOLN
INTRAMUSCULAR | Status: AC
  Filled 2017-05-19: qty 10

## 2017-05-19 MED ORDER — STERILE WATER FOR IRRIGATION IR SOLN
Status: DC | PRN
  Administered 2017-05-19: 09:00:00

## 2017-05-19 MED ORDER — SPOT INK MARKER SYRINGE KIT
PACK | SUBMUCOSAL | Status: DC | PRN
  Administered 2017-05-19: 8 mL via SUBMUCOSAL

## 2017-05-19 MED ORDER — MEPERIDINE HCL 100 MG/ML IJ SOLN
INTRAMUSCULAR | Status: DC | PRN
Start: 2017-05-19 — End: 2017-05-19
  Administered 2017-05-19: 50 mg via INTRAVENOUS
  Administered 2017-05-19: 25 mg via INTRAVENOUS

## 2017-05-19 MED ORDER — ONDANSETRON HCL 4 MG/2ML IJ SOLN
INTRAMUSCULAR | Status: AC
  Filled 2017-05-19: qty 2

## 2017-05-19 MED ORDER — MIDAZOLAM HCL 5 MG/5ML IJ SOLN
INTRAMUSCULAR | Status: DC | PRN
  Administered 2017-05-19: 2 mg via INTRAVENOUS
  Administered 2017-05-19 (×6): 1 mg via INTRAVENOUS

## 2017-05-19 NOTE — H&P (Signed)
_0 @   Primary Care Physician:  Sharilyn Sites, MD Primary Gastroenterologist:  Dr. Gala Romney  Pre-Procedure History & Physical: HPI:  Ariana Long is a 62 y.o. female here for  Here for diagnostic colonoscopy. Hysterectmy bleeding history mtiple: Gallbladder removed previously.  Past Medical History:  Diagnosis Date  . Cirrhosis of liver ([place]) 01/26/2011   FIBROSIS on liver biopsy in 2007, U/S  03/31/2013= mild diffuse hepatic steatosis and /or hepatocellular disease without focal hepatic parenchymal abnormality. per Dr. Patsy [place] (2012) pt is immune to hep A and Hep B. per 12/06/13 note from Liver clinic-pt had MRI which showed cirrhosis 2015.  Marland Kitchen Cirrhosis of liver ([place])   . Diverticulitis of colon   . Elevated AFP 01/26/2011  . Hematuria 12/12/2014  . Hemorrhoid 12/01/2012  . Hemorrhoids   . Hepatitis C 01/26/2011   2005 non responder, genotype 1, Gr 1-2, Stage 3 . Treated with Harvoni at the Liver clinic, responder  . Hidradenitis   . Hx: UTI (urinary tract infection)   . Kidney stones 10/2011  . Osteoporosis   . Serrated adenoma of colon 11/2011  . Splenomegaly 01/26/2011  . Thrombocytopenia ([place]) 01/26/2011  . Tubular adenoma   . Vulval lesion 12/14/2013   Has kissing lesions thin and puffy and paler i color about 5-6- mm in diameter.Dr Glo Herring in to co examine will try temovate and recheck in 3 months    Past Surgical History:  Procedure Laterality Date  . axillary gland removal  1979   bilateral  . BREAST BIOPSY  2011   Right  . BREAST EXCISIONAL BIOPSY Bilateral 25 years ago   Benign, fatty tumors  . CESAREAN SECTION     x 2  . COLONOSCOPY  07/31/2005   Deanette Tullius-Normal rectum/Normal colonoscopy/Repeat screening colonoscopy ten years  . COLONOSCOPY  11/16/2011   RMR: Friable anorectum-likely source of hematochezia/ LARGE 1.2x2.5CM SERRATED ADENOMA resected from ascending colon 7CM DISTAL TO ICV, hyperplastic polyp removed   . COLONOSCOPY N/A 07/18/2012   Dr. Gala Romney- normal  appearing rectal mucosa. no residual polpy tissue seen at tatoo location. remainder of colonic mucosa appeared entirely normal. bx = tubular adenoma  . COLONOSCOPY N/A 08/29/2015   Dr. Gala Romney: single 12 mm polyp in ascending colon, 4 mm polyp ascending. Sessile serrated adenoma. 3 year surveillance  . ESOPHAGEAL BANDING N/A 08/29/2015   Procedure: ESOPHAGEAL BANDING;  Surgeon: Daneil Dolin, MD;  Location: [facility];  Service: Endoscopy;  Laterality: N/A;  . ESOPHAGOGASTRODUODENOSCOPY N/A 08/29/2015   Dr. Gala Romney: non-critical Schatzki's ring, somewhat prominent small submucosal vessels without obvious varices. hyperplastic gastric polyp. 2 year surveillance  . HEMORRHOID BANDING    . SKIN LESION EXCISION      Prior to Admission medications   Medication Sig Start Date End Date Taking? Authorizing Provider  CALCIUM-MAGNESIUM PO Take 1 tablet by mouth once a week.   Yes [provider]  Cholecalciferol (VITAMIN D3) 5000 units CAPS Take 5,000 Units by mouth daily.   Yes [provider]  clobetasol ointment (TEMOVATE) 0.05 % Use 2-3 x weekly Patient taking differently: Apply 1 application topically 3 (three) times a week.  01/27/17  Yes Derrek Monaco A, NP  cyanocobalamin 500 MCG tablet Take 500 mcg by mouth daily.   Yes [provider]  ibuprofen (ADVIL,MOTRIN) 200 MG tablet Take 200 mg by mouth daily as needed for headache or moderate pain.   Yes [provider]  MILK THISTLE PO Take 175 mg by mouth daily.  Yes [provider]  Multiple Vitamin (MULTIVITAMIN) tablet Take 1 tablet by mouth daily.   Yes [provider]  Multiple Vitamins-Minerals (HAIR SKIN AND NAILS FORMULA) TABS Take 2 tablets by mouth daily.   Yes [provider]  Na Sulfate-K Sulfate-Mg Sulf 17.5-3.13-1.6 GM/177ML SOLN Take 1 kit by mouth as directed. 04/19/17  Yes Michai Dieppa, Cristopher Estimable, MD  naproxen sodium (ALEVE) 220 MG tablet Take 220 mg by mouth daily as needed  (pain).   Yes [provider]  Omega-3 Fatty Acids (FISH OIL) 1200 MG CAPS Take 1,200 mg by mouth daily.   Yes [provider]  Probiotic CAPS Take 1 capsule by mouth daily.   Yes [provider]  Selenium 100 MCG TABS Take 100 mcg by mouth daily.    Yes [provider]  vitamin C (ASCORBIC ACID) 500 MG tablet Take 500 mg by mouth daily.   Yes [provider]  vitamin E 400 UNIT capsule Take 400 Units by mouth 2 (two) times a week.    Yes [provider]  Wheat Dextrin (BENEFIBER) POWD Take 1 Scoop by mouth 2 (two) times a week.   Yes [provider]  fluconazole (DIFLUCAN) 100 MG tablet Take 1 daily for 10 days Patient not taking: Reported on 05/14/2017 04/08/17   Estill Dooms, NP  hydrocortisone (ANUSOL-HC) 2.5 % rectal cream Place 1 application rectally 2 (two) times daily. Patient not taking: Reported on 05/14/2017 01/27/17   Mahala Menghini, PA-C  nitrofurantoin, macrocrystal-monohydrate, (MACROBID) 100 MG capsule Take 1 capsule (100 mg total) by mouth 2 (two) times daily. Patient not taking: Reported on 05/14/2017 04/14/17   Estill Dooms, NP  sulfamethoxazole-trimethoprim (BACTRIM DS,SEPTRA DS) 800-160 MG tablet Take 1 tablet by mouth 2 (two) times daily. Take 1 bid Patient not taking: Reported on 05/14/2017 05/03/17   Estill Dooms, NP    Allergies as of 04/19/2017  . (No Known Allergies)    Family History  Problem Relation Age of Onset  . Heart attack Mother   . Stroke Mother   . Aneurysm Father   . Colon cancer Neg Hx     Social History   Socioeconomic History  . Marital status: Married    Spouse name: Not on file  . Number of children: 2  . Years of education: Not on file  . Highest education level: Not on file  Social Needs  . Financial resource strain: Not on file  . Food insecurity - worry: Not on file  . Food insecurity - inability: Not on file  . Transportation needs - medical: Not on  file  . Transportation needs - non-medical: Not on file  Occupational History  . Occupation: Occupational psychologist: [employer]  Tobacco Use  . Smoking status: Never Smoker  . Smokeless tobacco: Never Used  Substance and Sexual Activity  . Alcohol use: No  . Drug use: No  . Sexual activity: Yes    Birth control/protection: Post-menopausal  Other Topics Concern  . Not on file  Social History Narrative  . Not on file    Review of Systems: See HPI, otherwise negative ROS  Physical Exam: BP 127/83   Pulse 77   Temp 97.7 F (36.5 C) (Oral)   Resp 12   Ht '5\' 5"'$  (1.651 m)   Wt 155 lb (70.3 kg)   SpO2 100%   BMI 25.79 kg/m  General:   Alert,  Well-developed, well-nourished, pleasant and cooperative  in NAD Skin:  Intact without significant lesions or rashes. Eyes:  Sclera clear, no icterus.   Conjunctiva pink. Ears:  Normal auditory acuity. Nose:  No deformity, discharge,  or lesions. Mouth:  No deformity or lesions. Neck:  Supple; no masses or thyromegaly. No significant cervical adenopathy. Lungs:  Clear throughout to auscultation.   No wheezes, crackles, or rhonchi. No acute distress. Heart:  Regular rate and rhythm; no murmurs, clicks, rubs,  or gallops. Abdomen: Non-distended, normal bowel sounds.  Soft and nontender without appreciable mass or hepatosplenomegaly.  Pulses:  Normal pulses noted. Extremities:  Without clubbing or edema.  Impression: 62 year old lady with rectal bleeding history of multiple colon polyps previously. In need of further evaluation today. Colonoscopy per plan  Recommendations:  I have offered the patient a diagnostic colonoscopy today per plan.  The risks, benefits, limitations, alternatives and imponderables have been reviewed with the patient. Questions have been answered. All parties are agreeable.    Notice: This dictation was prepared with Dragon dictation along with smaller phrase technology. Any transcriptional errors that result  from this process are unintentional and may not be corrected upon review.

## 2017-05-19 NOTE — Op Note (Signed)
[facility] Patient Name: Ariana Long Procedure Date: 05/19/2017 8:18 AM MRN: [id number] Date of Birth: Jul 10, 1955 Attending MD: Norvel Richards , MD CSN: [id number] Age: 62 Admit Type: Ambulatory Procedure:                Colonoscopy Indications:              Hematochezia; History of colonic adenoma Providers:                Norvel Richards, MD, Lurline Del, RN, Hinton Rao, RN Referring MD:              Medicines:                Midazolam 8 mg IV, Meperidine 75 mg IV Complications:            No immediate complications. Estimated Blood Loss:     Estimated blood loss was minimal. Procedure:                Pre-Anesthesia Assessment:                           - Prior to the procedure, a History and Physical                            was performed, and patient medications and                            allergies were reviewed. The patient's tolerance of                            previous anesthesia was also reviewed. The risks                            and benefits of the procedure and the sedation                            options and risks were discussed with the patient.                            All questions were answered, and informed consent                            was obtained. Prior Anticoagulants: The patient has                            taken no previous anticoagulant or antiplatelet                            agents. ASA Grade Assessment: II - A patient with                            mild systemic disease. After reviewing the risks  and benefits, the patient was deemed in                            satisfactory condition to undergo the procedure.                           After obtaining informed consent, the colonoscope                            was passed under direct vision. Throughout the                            procedure, the patient's blood pressure, pulse, and                             oxygen saturations were monitored continuously. The                            EC-[id number] ([id number]) scope was introduced through                            the anus and advanced to the the cecum, identified                            by appendiceal orifice and ileocecal valve. The                            colonoscopy was performed with difficulty due to                            abnormal anatomy. The patient tolerated the                            procedure well. The quality of the bowel                            preparation was adequate. The ileocecal valve,                            appendiceal orifice, and rectum were photographed.                            The entire colon was examined. Scope In: 8:43:32 AM Scope Out: 9:41:34 AM Scope Withdrawal Time: 0 hours 53 minutes 47 seconds  Total Procedure Duration: 0 hours 58 minutes 2 seconds  Findings:      The perianal and digital rectal examinations were normal. In the area of       the hepatic flexure, there was a 4 x 2 cm sessile appearing lesion       behind a fold at the hepatic flexure which could be seen with "ball       valved" up with peristalsis as I was advancing the scope to the cecum.       Please see photos. This was a very difficult to approach and essentially  disappeared with peristalsis and respiratory excurcison. I retroflexed       the scope in the ascending segment to get better approach but this was       not helpful either. The remainder of the colon appeared normal aside       from evidence of an old tattoo site in the mid ascending segment.      I spent over an hour approaching this lesion at the hepatic flexure. I       attempted to inject Ellaview - approx 8 mL in the center and the       periphery of this lesion. However, again, approach was very difficult -       respiratory excursions made it even more difficult. I approached this       lesion with the snare and did debulk it. I couldn't never  get a       significant portion of this lesion engaged with the snare to attempt       further removal. It was unclear as to whether or not the Elaview lifted       this polypoid lesion significantly or not. Repeated 'fogging' of the       scope lense also thwarted attempts at therapuetic intervention.      Multiple tissue fragments were submitted to the pathologist. This lesion       was very soft and tissue sloughed off very easily. Finally, the coloic       mucosa was tattooed circumferentially approximately 5 cm distal to this       hepatic flexure lesion Impression:               -Sessile lesion at the hepatic flexure as                            described. Could not be completely removed as                            described. Will need surgical resection regardless                            of pathology. Moderate Sedation:      Moderate (conscious) sedation was administered by the endoscopy nurse       and supervised by the endoscopist. The following parameters were       monitored: oxygen saturation, heart rate, blood pressure, respiratory       rate, EKG, adequacy of pulmonary ventilation, and response to care.       Total physician intraservice time was 64 minutes. Recommendation:           - Patient has a contact number available for                            emergencies. The signs and symptoms of potential                            delayed complications were discussed with the                            patient. Return to normal activities tomorrow.  Written discharge instructions were provided to the                            patient.                           [facility] Recommendation].                           - Continue present medications.                           - Repeat colonoscopy date to be determined after                            pending pathology results are reviewed for                            surveillance based on pathology  results.                           - Return to GI clinic (date not yet determined). Procedure Code(s):        --- Professional ---                           [id number], Colonoscopy, flexible; diagnostic, including                            collection of specimen(s) by brushing or washing,                            when performed (separate procedure)                           99152, Moderate sedation services provided by the                            same physician or other qualified health care                            professional performing the diagnostic or                            therapeutic service that the sedation supports,                            requiring the presence of an independent trained                            observer to assist in the monitoring of the                            patient's level of consciousness and physiological                            status; initial  15 minutes of intraservice time,                            patient age 73 years or older                           [phone], Moderate sedation services; each additional                            15 minutes intraservice time                           99153, Moderate sedation services; each additional                            15 minutes intraservice time                           99153, Moderate sedation services; each additional                            15 minutes intraservice time Diagnosis Code(s):        --- Professional ---                           K92.1, Melena (includes Hematochezia) CPT copyright 2016 American Medical Association. All rights reserved. The codes documented in this report are preliminary and upon coder review may  be revised to meet current compliance requirements. Cristopher Estimable. Ashton Belote, MD Norvel Richards, MD 05/19/2017 10:05:33 AM This report has been signed electronically. Number of Addenda: 0

## 2017-05-19 NOTE — Discharge Instructions (Signed)
°  Colonoscopy Discharge Instructions  Read the instructions outlined below and refer to this sheet in the next few weeks. These discharge instructions provide you with general information on caring for yourself after you leave the hospital. Your doctor may also give you specific instructions. While your treatment has been planned according to the most current medical practices available, unavoidable complications occasionally occur. If you have any problems or questions after discharge, call Dr. Gala Romney at [phone]. ACTIVITY  You may resume your regular activity, but move at a slower pace for the next 24 hours.   Take frequent rest periods for the next 24 hours.   Walking will help get rid of the air and reduce the bloated feeling in your belly (abdomen).   No driving for 24 hours (because of the medicine (anesthesia) used during the test).    Do not sign any important legal documents or operate any machinery for 24 hours (because of the anesthesia used during the test).  NUTRITION  Drink plenty of fluids.   You may resume your normal diet as instructed by your doctor.   Begin with a light meal and progress to your normal diet. Heavy or fried foods are harder to digest and may make you feel sick to your stomach (nauseated).   Avoid alcoholic beverages for 24 hours or as instructed.  MEDICATIONS  You may resume your normal medications unless your doctor tells you otherwise.  WHAT YOU CAN EXPECT TODAY  Some feelings of bloating in the abdomen.   Passage of more gas than usual.   Spotting of blood in your stool or on the toilet paper.  IF YOU HAD POLYPS REMOVED DURING THE COLONOSCOPY:  No aspirin products for 7 days or as instructed.   No alcohol for 7 days or as instructed.   Eat a soft diet for the next 24 hours.  FINDING OUT THE RESULTS OF YOUR TEST Not all test results are available during your visit. If your test results are not back during the visit, make an appointment  with your caregiver to find out the results. Do not assume everything is normal if you have not heard from your caregiver or the medical facility. It is important for you to follow up on all of your test results.  SEEK IMMEDIATE MEDICAL ATTENTION IF:  You have more than a spotting of blood in your stool.   Your belly is swollen (abdominal distention).   You are nauseated or vomiting.   You have a temperature over 101.   You have abdominal pain or discomfort that is severe or gets worse throughout the day.    You have a lesion in your colon which could not be removed totally with the scope. It will require an operation for total removal  You may experience a little more rectal bleeding over the next 24 hours but should taper off  Further recommendations to follow pending review of pathology report

## 2017-05-21 ENCOUNTER — Encounter (HOSPITAL_COMMUNITY): Payer: Self-pay | Admitting: Internal Medicine

## 2017-05-24 ENCOUNTER — Other Ambulatory Visit: Payer: Self-pay

## 2017-05-24 DIAGNOSIS — C183 Malignant neoplasm of hepatic flexure: Secondary | ICD-10-CM

## 2017-06-02 ENCOUNTER — Telehealth: Payer: Self-pay | Admitting: Internal Medicine

## 2017-06-02 ENCOUNTER — Other Ambulatory Visit: Payer: Self-pay | Admitting: General Surgery

## 2017-06-02 NOTE — Telephone Encounter (Signed)
[phone] please call patient, she received a call about scheduling an ultrasound and she was told to wait on that. She has some questions

## 2017-06-02 NOTE — Telephone Encounter (Signed)
Patient called and stated she was told by our office to hold off on the ultrasound for now until she see's the surgeon. She is scheduled to see them today. She wants to hold off on ultrasound as well until she see's them. FYI to [place].

## 2017-06-02 NOTE — Telephone Encounter (Signed)
Noted  

## 2017-06-07 ENCOUNTER — Ambulatory Visit: Payer: 59 | Admitting: Gastroenterology

## 2017-06-08 ENCOUNTER — Other Ambulatory Visit: Payer: Self-pay | Admitting: General Surgery

## 2017-06-08 ENCOUNTER — Ambulatory Visit
Admission: RE | Admit: 2017-06-08 | Discharge: 2017-06-08 | Disposition: A | Discharge status: Home / Self Care | Payer: 59 | Source: Ambulatory Visit | Attending: General Surgery | Admitting: General Surgery

## 2017-06-08 DIAGNOSIS — C184 Malignant neoplasm of transverse colon: Secondary | ICD-10-CM

## 2017-06-08 MED ORDER — IOPAMIDOL (ISOVUE-300) INJECTION 61%
100.0000 mL | Freq: Once | INTRAVENOUS | Status: AC | PRN
  Administered 2017-06-08: 100 mL via INTRAVENOUS

## 2017-06-11 NOTE — Pre-Procedure Instructions (Signed)
Ariana Long  06/11/2017      [id number] - [place], [place] - [address] AT [address] [place] [address] Phone: [phone] Fax: [phone]    Your procedure is scheduled on February 12  Report to [facility] at [place].  Call this number if you have problems the morning of surgery:  [phone]   Remember:  Do not eat food or drink liquids after midnight.  Continue all medications as directed by your physician except follow these medication instructions before surgery below   Take these medicines the morning of surgery with A SIP OF WATER NONE  7 days prior to surgery STOP taking any Aspirin(unless otherwise instructed by your surgeon), Aleve, Naproxen, Ibuprofen, Motrin, Advil, Goody's, BC's, all herbal medications, fish oil, and all vitamins    Do not wear jewelry, make-up or nail polish.  Do not wear lotions, powders, or perfumes, or deodorant.  Do not shave 48 hours prior to surgery.   Do not bring valuables to the hospital.  [facility] is not responsible for any belongings or valuables.  Contacts, dentures or bridgework may not be worn into surgery.  Leave your suitcase in the car.  After surgery it may be brought to your room.  For patients admitted to the hospital, discharge time will be determined by your treatment team.  Patients discharged the day of surgery will not be allowed to drive home.    Special instructions:   [facility]- Preparing For Surgery  Before surgery, you can play an important role. Because skin is not sterile, your skin needs to be as free of germs as possible. You can reduce the number of germs on your skin by washing with CHG (chlorahexidine gluconate) Soap before surgery.  CHG is an antiseptic cleaner which kills germs and bonds with the skin to continue killing germs even after washing.  Please do not use if you have an allergy to CHG or antibacterial soaps. If your skin  becomes reddened/irritated stop using the CHG.  Do not shave (including legs and underarms) for at least 48 hours prior to first CHG shower. It is OK to shave your face.  Please follow these instructions carefully.   1. Shower the NIGHT BEFORE SURGERY and the MORNING OF SURGERY with CHG.   2. If you chose to wash your hair, wash your hair first as usual with your normal shampoo.  3. After you shampoo, rinse your hair and body thoroughly to remove the shampoo.  4. Use CHG as you would any other liquid soap. You can apply CHG directly to the skin and wash gently with a scrungie or a clean washcloth.   5. Apply the CHG Soap to your body ONLY FROM THE NECK DOWN.  Do not use on open wounds or open sores. Avoid contact with your eyes, ears, mouth and genitals (private parts). Wash Face and genitals (private parts)  with your normal soap.  6. Wash thoroughly, paying special attention to the area where your surgery will be performed.  7. Thoroughly rinse your body with warm water from the neck down.  8. DO NOT shower/wash with your normal soap after using and rinsing off the CHG Soap.  9. Pat yourself dry with a CLEAN TOWEL.  10. Wear CLEAN PAJAMAS to bed the night before surgery, wear comfortable clothes the morning of surgery  11. Place CLEAN SHEETS on your bed the night of your first shower and  DO NOT SLEEP WITH PETS.    Day of Surgery: Do not apply any deodorants/lotions. Please wear clean clothes to the hospital/surgery center.      Please read over the following fact sheets that you were given.

## 2017-06-13 NOTE — H&P (Signed)
Ariana Long Location: [facility] Patient #: [id number] DOB: [birth year] Married / Language: English / Race: Black or African American Female        History of Present Illness       This is a very pleasant 62 year old female from [place]. She is here with her husband to discuss management of a cancer of the proximal transverse colon. She is referred by Dr. Gala Romney. Dr. Sharilyn Sites is her PCP.      She has no prior colorectal problems other than colon polyps found on prior colonoscopy. She is asymptomatic. She has not seen any blood. Has no abdominal pain. Has not had any weight loss. Stool Hemoccult cards were positive. Colonoscopy was performed on May 19, 2017. In the area of the hepatic flexure there was a 4 x 2 cm sessile appearing lesion behind a fold. This was difficult to biopsy but several fragments were obtained confirming adenocarcinoma. Dr. Gala Romney said that the colonic mucosa was tattooed circumferentially 5 cm distal to the hepatic flexure lesion. No other testing has been done.      Past history reveals 2 cesarean sections through a Pfannenstiel incision. History hepatitis C, now treated. Had liver MRI 2015 which showed early cirrhosis but no portal hypertension. Apparently she had mild thrombocytopenia and splenomegaly in the past. She's had surgery for axillary hidradenitis. Right breast biopsy. Actually looks and feels fairly healthy. Family history reveals mother died of dementia. Father died of abdominal aortic aneurysm. Social history reveals she is married with 2 children [place]. Retired from [employer]. Denies tobacco or alcohol.  We had a long discussion. I described the segmental colon resection that she would need. I discussed the laparoscopic assisted approach. I discussed the preoperative bowel prep. She is ready to go ahead and schedule surgery She will be scheduled for a preop CT scan of  abdomen and pelvis with contrast urgently She'll be scheduled for laparoscopic-assisted right colectomy, possible open colectomy. I have discussed the indications, details, techniques, and numerous risk of the surgery with her and her husband. She is aware of the risk of bleeding, infection, wound healing problems such as hernia or dehiscence, injury to adjacent organs requiring reconstructive surgery, anastomotic leak requiring reoperation and colostomy, and other unforeseen problems. She knows that I am hopeful that this localized disease but she will be carefully staged with lymph node dissection and assessment of the liver. I have told her I'll refer her to a medical oncologist postop, but that she may not need chemotherapy if this is earlt stage disease. She understands all these issues. All of her questions are answered. She agrees with this plan. She requests [facility] as her facility of choice.   Addendum Note She called today and had a few questions about her upcoming surgery I answered her questions  Addendum:  CT shows 2.9 cm. area of prominence at hepatic flexure. No adenopathy or metastatic disease. Mild hepatic nodularity. Small right upper pole renal calculus-nonobstructing    Past Surgical History ( Breast Biopsy  Right. Cesarean Section - Multiple  Colon Polyp Removal - Colonoscopy   Diagnostic Studies History  Colonoscopy  within last year Mammogram  within last year Pap Smear  1-5 years ago  Allergies  No Known Drug Allergies  Allergies Reconciled   Medication History  Vitamin C (500MG  Tablet, 1 (one) Oral) Active. Vitamin E (400UNIT Capsule, 1 (one) Oral) Active. Multiple Vitamin (1 (one) Oral) Active. Calcium-Magnesium (Oral) Specific strength unknown -  Active. Vitamin D (Ergocalciferol) (50000UNIT Capsule, Oral) Active. Cyanocobalamin (500MCG Tablet, Oral) Active. Ibuprofen (200MG  Tablet, Oral) Active. Milk Thistle (175MG   Capsule, Oral) Active. Naproxen Sodium (220MG  Tablet, Oral) Active. Omega 3 (1200MG  Capsule, Oral) Active. Probiotic (Oral) Active. Selenium (100MCG Tablet, Oral) Active. Wheat Dextrin (Oral) Active. Sulfamethoxazole-Trimethoprim (Oral) Specific strength unknown - Active. Medications Reconciled  Social History  Caffeine use  Carbonated beverages, Coffee. No alcohol use  No drug use  Tobacco use  Never smoker.  Family History  Diabetes Mellitus  Brother, Mother. Heart Disease  Father, Mother. Heart disease in female family member before age 28  Hypertension  Mother.  Pregnancy / Birth History  Age at menarche  41 years. Age of menopause  57-50 Gravida  27 Maternal age  4-30 Para  2  Other Problems  Cholelithiasis  Cirrhosis Of Liver  Diverticulosis  Hemorrhoids  Hepatitis  Kidney Stone  Ventral Hernia Repair     Review of Systems  General Present- Weight Loss. Not Present- Appetite Loss, Chills, Fatigue, Fever, Night Sweats and Weight Gain. HEENT Not Present- Earache, Hearing Loss, Hoarseness, Nose Bleed, Oral Ulcers, Ringing in the Ears, Seasonal Allergies, Sinus Pain, Sore Throat, Visual Disturbances, Wears glasses/contact lenses and Yellow Eyes. Respiratory Not Present- Bloody sputum, Chronic Cough, Difficulty Breathing, Snoring and Wheezing. Breast Not Present- Breast Mass, Breast Pain, Nipple Discharge and Skin Changes. Cardiovascular Present- Leg Cramps. Not Present- Chest Pain, Difficulty Breathing Lying Down, Palpitations, Rapid Heart Rate, Shortness of Breath and Swelling of Extremities. Gastrointestinal Present- Hemorrhoids. Not Present- Abdominal Pain, Bloating, Bloody Stool, Change in Bowel Habits, Chronic diarrhea, Constipation, Difficulty Swallowing, Excessive gas, Gets full quickly at meals, Indigestion, Nausea, Rectal Pain and Vomiting. Female Genitourinary Not Present- Frequency, Nocturia, Painful Urination, Pelvic Pain and  Urgency. Musculoskeletal Not Present- Back Pain, Joint Pain, Joint Stiffness, Muscle Pain, Muscle Weakness and Swelling of Extremities. Neurological Not Present- Decreased Memory, Fainting, Headaches, Numbness, Seizures, Tingling, Tremor, Trouble walking and Weakness. Psychiatric Not Present- Anxiety, Bipolar, Change in Sleep Pattern, Depression, Fearful and Frequent crying. Endocrine Not Present- Cold Intolerance, Excessive Hunger, Hair Changes, Heat Intolerance, Hot flashes and New Diabetes.  Vitals  Weight: 153.25 lb Height: 65in Height was reported by patient. Body Surface Area: 1.77 m Body Mass Index: 25.5 kg/m  Temp.: 98.23F(Oral)  Pulse: 94 (Regular)  BP: 108/80 (Sitting, Left Arm, Standard)       Physical Exam General Mental Status-Alert. General Appearance-Consistent with stated age. Hydration-Well hydrated. Voice-Normal.  Head and Neck Head-normocephalic, atraumatic with no lesions or palpable masses. Trachea-midline. Thyroid Gland Characteristics - normal size and consistency.  Eye Eyeball - Bilateral-Extraocular movements intact. Sclera/Conjunctiva - Bilateral-No scleral icterus.  Chest and Lung Exam Chest and lung exam reveals -quiet, even and easy respiratory effort with no use of accessory muscles and on auscultation, normal breath sounds, no adventitious sounds and normal vocal resonance. Inspection Chest Wall - Normal. Back - normal.  Cardiovascular Cardiovascular examination reveals -normal heart sounds, regular rate and rhythm with no murmurs and normal pedal pulses bilaterally.  Abdomen Note: Abdomen soft and nontender. Nondistended. Liver and spleen nonpalpable. Umbilicus feels normal. Pfannenstiel incision healed. No palpable mass. No inguinal adenopathy   Neurologic Neurologic evaluation reveals -alert and oriented x 3 with no impairment of recent or remote memory. Mental  Status-Normal.  Musculoskeletal Normal Exam - Left-Upper Extremity Strength Normal and Lower Extremity Strength Normal. Normal Exam - Right-Upper Extremity Strength Normal and Lower Extremity Strength Normal.  Lymphatic Head & Neck  General Head & Neck Lymphatics: Bilateral -  Description - Normal. Axillary  General Axillary Region: Bilateral - Description - Normal. Tenderness - Non Tender. Femoral & Inguinal  Generalized Femoral & Inguinal Lymphatics: Bilateral - Description - Normal. Tenderness - Non Tender.    Assessment & Plan CANCER OF TRANSVERSE COLON (C18.4) Started Flagyl 500 MG Oral Tablet, 2 (two) Tablet SEE NOTE, #6, 06/02/2017, No Refill. Local Order: Take at 2pm, 3pm, and 10pm the day prior to your colon operation Started Neomycin Sulfate 500 MG Oral Tablet, 2 (two) Tablet SEE NOTE, #6, 06/02/2017, No Refill. Local Order: TAKE TWO TABLETS AT 2 PM, 3 PM, AND 10 PM THE DAY PRIOR TO SURGERY        Dr. Gala Romney found a small cancer in your proximal transverse colon We drew pictures of the location of this in the office today.      you will need to have an operation to remove a segment of the colon involving the cancer This is called a right colectomy  Prior to the surgery you will be scheduled for a CT scan of the abdomen and pelvis as a precaution to make sure we don't see any disease elsewhere One day prior to the surgery you will need to undergo a bowel prep which will involve laxatives and antibiotic pills My nursing staff will instruct you carefully in this. Please read over all of the printed information that I gave you I have discussed the indications, techniques, and risk of the surgery with you and her husband in detail   HISTORY OF HEPATITIS C (Z86.19) Impression: Treated HISTORY OF SPLENOMEGALY (S06.301) HISTORY OF CESAREAN SECTION, LOW TRANSVERSE (S01.093)    Edsel Petrin. Dalbert Batman, M.D., [facility] General and Minimally  invasive Surgery Breast and Colorectal Surgery Office:   [phone] Pager:   [phone]

## 2017-06-14 ENCOUNTER — Encounter (HOSPITAL_COMMUNITY)
Admission: RE | Admit: 2017-06-14 | Discharge: 2017-06-14 | Disposition: A | Discharge status: Home / Self Care | Payer: 59 | Source: Ambulatory Visit | Attending: General Surgery | Admitting: General Surgery

## 2017-06-14 ENCOUNTER — Other Ambulatory Visit: Payer: Self-pay

## 2017-06-14 ENCOUNTER — Encounter (HOSPITAL_COMMUNITY): Payer: Self-pay

## 2017-06-14 HISTORY — DX: Calculus of gallbladder without cholecystitis without obstruction: K80.20

## 2017-06-14 HISTORY — DX: Personal history of other diseases of the digestive system: Z87.19

## 2017-06-14 LAB — COMPREHENSIVE METABOLIC PANEL
ALT: 28 U/L (ref 14–54)
AST: 35 U/L (ref 15–41)
Albumin: 4 g/dL (ref 3.5–5.0)
Alkaline Phosphatase: 74 U/L (ref 38–126)
Anion gap: 10 (ref 5–15)
BUN: 14 mg/dL (ref 6–20)
CHLORIDE: 108 mmol/L (ref 101–111)
CO2: 22 mmol/L (ref 22–32)
Calcium: 9.3 mg/dL (ref 8.9–10.3)
Creatinine, Ser: 0.73 mg/dL (ref 0.44–1.00)
GFR calc Af Amer: >60 mL/min (ref >60)
GFR calc non Af Amer: >60 mL/min (ref >60)
GLUCOSE: 94 mg/dL (ref 65–99)
POTASSIUM: 3.9 mmol/L (ref 3.5–5.1)
SODIUM: 140 mmol/L (ref 135–145)
Total Bilirubin: 0.3 mg/dL (ref 0.3–1.2)
Total Protein: 7.6 g/dL (ref 6.5–8.1)

## 2017-06-14 LAB — CBC WITH DIFFERENTIAL/PLATELET
BASOS ABS: 0 10*3/uL (ref 0.0–0.1)
Basophils Relative: 1 %
EOS PCT: 3 %
Eosinophils Absolute: 0.1 10*3/uL (ref 0.0–0.7)
HEMATOCRIT: 34.4 % — AB (ref 36.0–46.0)
Hemoglobin: 11 g/dL — ABNORMAL LOW (ref 12.0–15.0)
Lymphocytes Relative: 37 %
Lymphs Abs: 1.5 10*3/uL (ref 0.7–4.0)
MCH: 28.1 pg (ref 26.0–34.0)
MCHC: 32 g/dL (ref 30.0–36.0)
MCV: 88 fL (ref 78.0–100.0)
MONO ABS: 0.3 10*3/uL (ref 0.1–1.0)
Monocytes Relative: 7 %
NEUTROS ABS: 2.1 10*3/uL (ref 1.7–7.7)
Neutrophils Relative %: 52 %
PLATELETS: 188 10*3/uL (ref 150–400)
RBC: 3.91 MIL/uL (ref 3.87–5.11)
RDW: 14.8 % (ref 11.5–15.5)
WBC: 4.1 10*3/uL (ref 4.0–10.5)

## 2017-06-14 LAB — HEMOGLOBIN A1C
Hgb A1c MFr Bld: 5.5 % (ref 4.8–5.6)
Mean Plasma Glucose: 111.15 mg/dL

## 2017-06-14 LAB — PROTIME-INR
INR: 1.02
PROTHROMBIN TIME: 13.3 s (ref 11.4–15.2)

## 2017-06-14 LAB — APTT: aPTT: 26 seconds (ref 24–36)

## 2017-06-14 NOTE — Progress Notes (Signed)
PCP - Sharilyn Sites Cardiologist - [place]  Chest x-ray - not needed EKG - requesting from PCP Stress Test - 2017 ECHO - 2014 Cardiac Cath -denies    Anesthesia review: no,   Patient was given bowel prep instructions by office  Patient attended ERAS class and was given the presurgery ensure at the class  Patient denies shortness of breath, fever, cough and chest pain at PAT appointment   Patient verbalized understanding of instructions that were given to them at the PAT appointment. Patient was also instructed that they will need to review over the PAT instructions again at home before surgery.

## 2017-06-15 ENCOUNTER — Encounter (HOSPITAL_COMMUNITY): Payer: Self-pay | Admitting: *Deleted

## 2017-06-15 ENCOUNTER — Other Ambulatory Visit: Payer: Self-pay

## 2017-06-15 ENCOUNTER — Inpatient Hospital Stay (HOSPITAL_COMMUNITY)
Admission: RE | Admit: 2017-06-15 | Discharge: 2017-06-19 | DRG: 330 | Disposition: A | Discharge status: Home / Self Care | Payer: 59 | Source: Ambulatory Visit | Attending: General Surgery | Admitting: General Surgery

## 2017-06-15 ENCOUNTER — Encounter (HOSPITAL_COMMUNITY)
Admission: RE | Disposition: A | Discharge status: Home / Self Care | Payer: Self-pay | Source: Ambulatory Visit | Attending: General Surgery

## 2017-06-15 ENCOUNTER — Inpatient Hospital Stay (HOSPITAL_COMMUNITY): Payer: 59 | Admitting: Anesthesiology

## 2017-06-15 DIAGNOSIS — K7460 Unspecified cirrhosis of liver: Secondary | ICD-10-CM | POA: Diagnosis present

## 2017-06-15 DIAGNOSIS — D62 Acute posthemorrhagic anemia: Secondary | ICD-10-CM | POA: Diagnosis not present

## 2017-06-15 DIAGNOSIS — C183 Malignant neoplasm of hepatic flexure: Secondary | ICD-10-CM | POA: Diagnosis present

## 2017-06-15 DIAGNOSIS — R161 Splenomegaly, not elsewhere classified: Secondary | ICD-10-CM | POA: Diagnosis present

## 2017-06-15 DIAGNOSIS — Z8619 Personal history of other infectious and parasitic diseases: Secondary | ICD-10-CM

## 2017-06-15 DIAGNOSIS — N200 Calculus of kidney: Secondary | ICD-10-CM | POA: Diagnosis present

## 2017-06-15 DIAGNOSIS — Z86010 Personal history of colonic polyps: Secondary | ICD-10-CM

## 2017-06-15 DIAGNOSIS — C184 Malignant neoplasm of transverse colon: Secondary | ICD-10-CM | POA: Diagnosis present

## 2017-06-15 HISTORY — PX: LAPAROSCOPIC RIGHT COLECTOMY: SHX5925

## 2017-06-15 HISTORY — DX: Malignant neoplasm of hepatic flexure: C18.3

## 2017-06-15 HISTORY — PX: COLECTOMY: SHX59

## 2017-06-15 LAB — CREATININE, SERUM
Creatinine, Ser: 0.87 mg/dL (ref 0.44–1.00)
GFR calc non Af Amer: >60 mL/min (ref >60)

## 2017-06-15 LAB — CBC
HCT: 31.2 % — ABNORMAL LOW (ref 36.0–46.0)
HEMOGLOBIN: 9.7 g/dL — AB (ref 12.0–15.0)
MCH: 27.4 pg (ref 26.0–34.0)
MCHC: 31.1 g/dL (ref 30.0–36.0)
MCV: 88.1 fL (ref 78.0–100.0)
Platelets: 182 10*3/uL (ref 150–400)
RBC: 3.54 MIL/uL — ABNORMAL LOW (ref 3.87–5.11)
RDW: 14.5 % (ref 11.5–15.5)
WBC: 11.1 10*3/uL — AB (ref 4.0–10.5)

## 2017-06-15 LAB — CEA: CEA: 6.8 ng/mL — ABNORMAL HIGH (ref 0.0–4.7)

## 2017-06-15 SURGERY — COLECTOMY, RIGHT, LAPAROSCOPIC
Anesthesia: General | Site: Abdomen

## 2017-06-15 MED ORDER — PANTOPRAZOLE SODIUM 40 MG IV SOLR
40.0000 mg | Freq: Every day | INTRAVENOUS | Status: DC
  Administered 2017-06-15 – 2017-06-18 (×4): 40 mg via INTRAVENOUS
  Filled 2017-06-15 (×4): qty 40

## 2017-06-15 MED ORDER — ONDANSETRON HCL 4 MG/2ML IJ SOLN
INTRAMUSCULAR | Status: AC
  Filled 2017-06-15: qty 2

## 2017-06-15 MED ORDER — HYDROCODONE-ACETAMINOPHEN 5-325 MG PO TABS
1.0000 | ORAL_TABLET | ORAL | Status: DC | PRN
  Administered 2017-06-16: 2 via ORAL
  Administered 2017-06-16: 1 via ORAL
  Administered 2017-06-16 – 2017-06-17 (×4): 2 via ORAL
  Administered 2017-06-18: 1 via ORAL
  Filled 2017-06-15 (×3): qty 2
  Filled 2017-06-15: qty 1
  Filled 2017-06-15 (×3): qty 2

## 2017-06-15 MED ORDER — HYDROMORPHONE HCL 1 MG/ML IJ SOLN
0.2500 mg | INTRAMUSCULAR | Status: DC | PRN
  Administered 2017-06-15: 0.5 mg via INTRAVENOUS

## 2017-06-15 MED ORDER — CEFOTETAN DISODIUM-DEXTROSE 2-2.08 GM-%(50ML) IV SOLR
INTRAVENOUS | Status: AC
  Filled 2017-06-15: qty 50

## 2017-06-15 MED ORDER — HYDROMORPHONE HCL 1 MG/ML IJ SOLN
1.0000 mg | INTRAMUSCULAR | Status: DC | PRN
  Administered 2017-06-15 (×2): 1 mg via INTRAVENOUS
  Filled 2017-06-15 (×2): qty 1

## 2017-06-15 MED ORDER — COQ10 100 MG PO CAPS: 100.0000 mg | ORAL_CAPSULE | Freq: Every day | ORAL | Status: DC

## 2017-06-15 MED ORDER — LIDOCAINE 2% (20 MG/ML) 5 ML SYRINGE
INTRAMUSCULAR | Status: DC | PRN
  Administered 2017-06-15: 80 mg via INTRAVENOUS

## 2017-06-15 MED ORDER — 0.9 % SODIUM CHLORIDE (POUR BTL) OPTIME
TOPICAL | Status: DC | PRN
  Administered 2017-06-15 (×4): 1000 mL

## 2017-06-15 MED ORDER — PROMETHAZINE HCL 25 MG/ML IJ SOLN: 6.2500 mg | INTRAMUSCULAR | Status: DC | PRN

## 2017-06-15 MED ORDER — FENTANYL CITRATE (PF) 250 MCG/5ML IJ SOLN
INTRAMUSCULAR | Status: AC
  Filled 2017-06-15: qty 5

## 2017-06-15 MED ORDER — PROPOFOL 10 MG/ML IV BOLUS
INTRAVENOUS | Status: DC | PRN
  Administered 2017-06-15: 50 mg via INTRAVENOUS
  Administered 2017-06-15: 150 mg via INTRAVENOUS

## 2017-06-15 MED ORDER — OXYCODONE HCL 5 MG PO TABS: 5.0000 mg | ORAL_TABLET | Freq: Once | ORAL | Status: DC | PRN

## 2017-06-15 MED ORDER — SODIUM CHLORIDE 0.9 % IV SOLN
2.0000 g | Freq: Two times a day (BID) | INTRAVENOUS | Status: AC
  Administered 2017-06-15: 2 g via INTRAVENOUS
  Filled 2017-06-15: qty 2

## 2017-06-15 MED ORDER — ALVIMOPAN 12 MG PO CAPS
12.0000 mg | ORAL_CAPSULE | Freq: Two times a day (BID) | ORAL | Status: DC
  Administered 2017-06-16 – 2017-06-18 (×4): 12 mg via ORAL
  Filled 2017-06-15 (×5): qty 1

## 2017-06-15 MED ORDER — RISAQUAD PO CAPS
1.0000 | ORAL_CAPSULE | Freq: Every day | ORAL | Status: DC
  Administered 2017-06-16 – 2017-06-19 (×4): 1 via ORAL
  Filled 2017-06-15 (×4): qty 1

## 2017-06-15 MED ORDER — KCL-LACTATED RINGERS-D5W 20 MEQ/L IV SOLN
INTRAVENOUS | Status: DC
  Administered 2017-06-15 – 2017-06-18 (×7): via INTRAVENOUS
  Filled 2017-06-15 (×9): qty 1000

## 2017-06-15 MED ORDER — PHENYLEPHRINE 40 MCG/ML (10ML) SYRINGE FOR IV PUSH (FOR BLOOD PRESSURE SUPPORT)
PREFILLED_SYRINGE | INTRAVENOUS | Status: DC | PRN
  Administered 2017-06-15 (×6): 80 ug via INTRAVENOUS

## 2017-06-15 MED ORDER — ROCURONIUM BROMIDE 10 MG/ML (PF) SYRINGE
PREFILLED_SYRINGE | INTRAVENOUS | Status: DC | PRN
  Administered 2017-06-15: 20 mg via INTRAVENOUS
  Administered 2017-06-15: 50 mg via INTRAVENOUS

## 2017-06-15 MED ORDER — ALVIMOPAN 12 MG PO CAPS
12.0000 mg | ORAL_CAPSULE | ORAL | Status: AC
  Administered 2017-06-15: 12 mg via ORAL

## 2017-06-15 MED ORDER — SUGAMMADEX SODIUM 200 MG/2ML IV SOLN
INTRAVENOUS | Status: AC
  Filled 2017-06-15: qty 2

## 2017-06-15 MED ORDER — SUCCINYLCHOLINE CHLORIDE 200 MG/10ML IV SOSY
PREFILLED_SYRINGE | INTRAVENOUS | Status: DC | PRN
  Administered 2017-06-15: 120 mg via INTRAVENOUS

## 2017-06-15 MED ORDER — CHLORHEXIDINE GLUCONATE 4 % EX LIQD: 60.0000 mL | Freq: Once | CUTANEOUS | Status: DC

## 2017-06-15 MED ORDER — FENTANYL CITRATE (PF) 100 MCG/2ML IJ SOLN
INTRAMUSCULAR | Status: DC | PRN
  Administered 2017-06-15 (×3): 50 ug via INTRAVENOUS
  Administered 2017-06-15: 100 ug via INTRAVENOUS

## 2017-06-15 MED ORDER — ALVIMOPAN 12 MG PO CAPS
ORAL_CAPSULE | ORAL | Status: AC
  Filled 2017-06-15: qty 1

## 2017-06-15 MED ORDER — SUCCINYLCHOLINE CHLORIDE 200 MG/10ML IV SOSY
PREFILLED_SYRINGE | INTRAVENOUS | Status: AC
  Filled 2017-06-15: qty 10

## 2017-06-15 MED ORDER — LIDOCAINE 2% (20 MG/ML) 5 ML SYRINGE
INTRAMUSCULAR | Status: AC
  Filled 2017-06-15: qty 5

## 2017-06-15 MED ORDER — SUGAMMADEX SODIUM 200 MG/2ML IV SOLN
INTRAVENOUS | Status: DC | PRN
  Administered 2017-06-15: 200 mg via INTRAVENOUS

## 2017-06-15 MED ORDER — MIDAZOLAM HCL 2 MG/2ML IJ SOLN
INTRAMUSCULAR | Status: AC
  Filled 2017-06-15: qty 2

## 2017-06-15 MED ORDER — OXYCODONE HCL 5 MG/5ML PO SOLN: 5.0000 mg | Freq: Once | ORAL | Status: DC | PRN

## 2017-06-15 MED ORDER — SODIUM CHLORIDE 0.9 % IR SOLN
Status: DC | PRN
  Administered 2017-06-15: 1000 mL

## 2017-06-15 MED ORDER — ONDANSETRON HCL 4 MG/2ML IJ SOLN
INTRAMUSCULAR | Status: DC | PRN
  Administered 2017-06-15: 4 mg via INTRAVENOUS

## 2017-06-15 MED ORDER — PROPOFOL 10 MG/ML IV BOLUS
INTRAVENOUS | Status: AC
  Filled 2017-06-15: qty 20

## 2017-06-15 MED ORDER — HYDROMORPHONE HCL 1 MG/ML IJ SOLN
INTRAMUSCULAR | Status: AC
  Filled 2017-06-15: qty 1

## 2017-06-15 MED ORDER — VITAMIN B-12 1000 MCG PO TABS
500.0000 ug | ORAL_TABLET | Freq: Every day | ORAL | Status: DC
  Administered 2017-06-16 – 2017-06-19 (×4): 500 ug via ORAL
  Filled 2017-06-15 (×4): qty 1

## 2017-06-15 MED ORDER — ROCURONIUM BROMIDE 10 MG/ML (PF) SYRINGE
PREFILLED_SYRINGE | INTRAVENOUS | Status: AC
  Filled 2017-06-15: qty 5

## 2017-06-15 MED ORDER — ONDANSETRON HCL 4 MG/2ML IJ SOLN
4.0000 mg | Freq: Four times a day (QID) | INTRAMUSCULAR | Status: DC | PRN
  Administered 2017-06-15: 4 mg via INTRAVENOUS
  Filled 2017-06-15: qty 2

## 2017-06-15 MED ORDER — ONDANSETRON HCL 4 MG PO TABS: 4.0000 mg | ORAL_TABLET | Freq: Four times a day (QID) | ORAL | Status: DC | PRN

## 2017-06-15 MED ORDER — CEFOTETAN DISODIUM-DEXTROSE 2-2.08 GM-%(50ML) IV SOLR
2.0000 g | INTRAVENOUS | Status: AC
  Administered 2017-06-15: 2 g via INTRAVENOUS

## 2017-06-15 MED ORDER — DEXAMETHASONE SODIUM PHOSPHATE 10 MG/ML IJ SOLN
INTRAMUSCULAR | Status: DC | PRN
  Administered 2017-06-15: 10 mg via INTRAVENOUS

## 2017-06-15 MED ORDER — BUPIVACAINE-EPINEPHRINE (PF) 0.5% -1:200000 IJ SOLN
INTRAMUSCULAR | Status: AC
  Filled 2017-06-15: qty 30

## 2017-06-15 MED ORDER — ACETAMINOPHEN 325 MG PO TABS
650.0000 mg | ORAL_TABLET | Freq: Four times a day (QID) | ORAL | Status: DC | PRN
  Administered 2017-06-16: 650 mg via ORAL
  Filled 2017-06-15: qty 2

## 2017-06-15 MED ORDER — BUPIVACAINE-EPINEPHRINE 0.5% -1:200000 IJ SOLN
INTRAMUSCULAR | Status: DC | PRN
  Administered 2017-06-15: 19 mL

## 2017-06-15 MED ORDER — ONDANSETRON HCL 4 MG/2ML IJ SOLN
4.0000 mg | Freq: Once | INTRAMUSCULAR | Status: AC
  Administered 2017-06-15: 4 mg via INTRAVENOUS

## 2017-06-15 MED ORDER — LACTATED RINGERS IV SOLN
INTRAVENOUS | Status: DC
  Administered 2017-06-15 (×5): via INTRAVENOUS

## 2017-06-15 MED ORDER — MIDAZOLAM HCL 5 MG/5ML IJ SOLN
INTRAMUSCULAR | Status: DC | PRN
  Administered 2017-06-15: 2 mg via INTRAVENOUS

## 2017-06-15 MED ORDER — PHENYLEPHRINE 40 MCG/ML (10ML) SYRINGE FOR IV PUSH (FOR BLOOD PRESSURE SUPPORT)
PREFILLED_SYRINGE | INTRAVENOUS | Status: AC
  Filled 2017-06-15: qty 10

## 2017-06-15 MED ORDER — MEPERIDINE HCL 50 MG/ML IJ SOLN: 6.2500 mg | INTRAMUSCULAR | Status: DC | PRN

## 2017-06-15 MED ORDER — ENOXAPARIN SODIUM 40 MG/0.4ML ~~LOC~~ SOLN
40.0000 mg | SUBCUTANEOUS | Status: DC
  Administered 2017-06-16 – 2017-06-18 (×3): 40 mg via SUBCUTANEOUS
  Filled 2017-06-15 (×4): qty 0.4

## 2017-06-15 SURGICAL SUPPLY — 72 items
APPLIER CLIP ROT 10 11.4 M/L (STAPLE)
BLADE CLIPPER SURG (BLADE) IMPLANT
CANISTER SUCT 3000ML PPV (MISCELLANEOUS) ×4 IMPLANT
CELLS DAT CNTRL 66122 CELL SVR (MISCELLANEOUS) ×1 IMPLANT
CHLORAPREP W/TINT 26ML (MISCELLANEOUS) ×2 IMPLANT
CLIP APPLIE ROT 10 11.4 M/L (STAPLE) IMPLANT
COVER MAYO STAND STRL (DRAPES) ×2 IMPLANT
COVER SURGICAL LIGHT HANDLE (MISCELLANEOUS) ×4 IMPLANT
DRAPE HALF SHEET 40X57 (DRAPES) ×2 IMPLANT
DRAPE LAPAROSCOPIC ABDOMINAL (DRAPES) IMPLANT
DRAPE UTILITY XL STRL (DRAPES) ×2 IMPLANT
DRAPE WARM FLUID 44X44 (DRAPE) ×2 IMPLANT
DRSG OPSITE POSTOP 4X6 (GAUZE/BANDAGES/DRESSINGS) ×2 IMPLANT
DRSG OPSITE POSTOP 4X8 (GAUZE/BANDAGES/DRESSINGS) IMPLANT
DRSG TEGADERM 2-3/8X2-3/4 SM (GAUZE/BANDAGES/DRESSINGS) ×10 IMPLANT
ELECT BLADE 6.5 EXT (BLADE) IMPLANT
ELECT CAUTERY BLADE 6.4 (BLADE) ×4 IMPLANT
ELECT REM PT RETURN 9FT ADLT (ELECTROSURGICAL) ×2
ELECTRODE REM PT RTRN 9FT ADLT (ELECTROSURGICAL) ×1 IMPLANT
GAUZE SPONGE 2X2 8PLY NS (GAUZE/BANDAGES/DRESSINGS) ×2 IMPLANT
GEL ULTRASOUND 20GR AQUASONIC (MISCELLANEOUS) IMPLANT
GLOVE EUDERMIC 7 POWDERFREE (GLOVE) ×4 IMPLANT
GOWN STRL REUS W/ TWL LRG LVL3 (GOWN DISPOSABLE) ×6 IMPLANT
GOWN STRL REUS W/ TWL XL LVL3 (GOWN DISPOSABLE) ×2 IMPLANT
GOWN STRL REUS W/TWL LRG LVL3 (GOWN DISPOSABLE) ×6
GOWN STRL REUS W/TWL XL LVL3 (GOWN DISPOSABLE) ×2
KIT BASIN OR (CUSTOM PROCEDURE TRAY) ×2 IMPLANT
LIGASURE IMPACT 36 18CM CVD LR (INSTRUMENTS) ×2 IMPLANT
NS IRRIG 1000ML POUR BTL (IV SOLUTION) ×4 IMPLANT
PAD ARMBOARD 7.5X6 YLW CONV (MISCELLANEOUS) ×4 IMPLANT
PENCIL BUTTON HOLSTER BLD 10FT (ELECTRODE) ×4 IMPLANT
RELOAD PROXIMATE 75MM BLUE (ENDOMECHANICALS) ×4 IMPLANT
RTRCTR WOUND ALEXIS 18CM MED (MISCELLANEOUS) ×2
SCISSORS LAP 5X35 DISP (ENDOMECHANICALS) ×2 IMPLANT
SET IRRIG TUBING LAPAROSCOPIC (IRRIGATION / IRRIGATOR) ×2 IMPLANT
SHEARS HARMONIC ACE PLUS 36CM (ENDOMECHANICALS) ×2 IMPLANT
SLEEVE ENDOPATH XCEL 5M (ENDOMECHANICALS) ×4 IMPLANT
SPECIMEN JAR LARGE (MISCELLANEOUS) IMPLANT
SPECIMEN JAR X LARGE (MISCELLANEOUS) ×2 IMPLANT
SPONGE LAP 18X18 X RAY DECT (DISPOSABLE) ×2 IMPLANT
STAPLER GUN LINEAR PROX 60 (STAPLE) ×2 IMPLANT
STAPLER PROXIMATE 75MM BLUE (STAPLE) ×2 IMPLANT
STAPLER VISISTAT 35W (STAPLE) ×2 IMPLANT
SUCTION POOLE TIP (SUCTIONS) ×2 IMPLANT
SUT PDS AB 1 CT  36 (SUTURE) ×2
SUT PDS AB 1 CT 36 (SUTURE) ×2 IMPLANT
SUT PDS AB 1 TP1 96 (SUTURE) ×4 IMPLANT
SUT SILK 2 0 (SUTURE) ×1
SUT SILK 2 0 SH CR/8 (SUTURE) ×2 IMPLANT
SUT SILK 2 0 TIES 10X30 (SUTURE) IMPLANT
SUT SILK 2-0 18XBRD TIE 12 (SUTURE) ×1 IMPLANT
SUT SILK 3 0 (SUTURE) ×1
SUT SILK 3 0 SH CR/8 (SUTURE) ×2 IMPLANT
SUT SILK 3 0 TIES 10X30 (SUTURE) IMPLANT
SUT SILK 3-0 18XBRD TIE 12 (SUTURE) ×1 IMPLANT
SYR BULB IRRIGATION 50ML (SYRINGE) ×2 IMPLANT
SYS LAPSCP GELPORT 120MM (MISCELLANEOUS)
SYSTEM LAPSCP GELPORT 120MM (MISCELLANEOUS) IMPLANT
TOWEL OR 17X26 10 PK STRL BLUE (TOWEL DISPOSABLE) ×2 IMPLANT
TRAY FOLEY CATH SILVER 16FR (SET/KITS/TRAYS/PACK) ×2 IMPLANT
TRAY FOLEY W/METER SILVER 16FR (SET/KITS/TRAYS/PACK) IMPLANT
TRAY LAPAROSCOPIC MC (CUSTOM PROCEDURE TRAY) ×2 IMPLANT
TROCAR ADV FIXATION 5X100MM (TROCAR) ×8 IMPLANT
TROCAR XCEL 12X100 BLDLESS (ENDOMECHANICALS) IMPLANT
TROCAR XCEL BLUNT TIP 100MML (ENDOMECHANICALS) ×2 IMPLANT
TROCAR XCEL NON-BLD 11X100MML (ENDOMECHANICALS) IMPLANT
TROCAR XCEL NON-BLD 5MMX100MML (ENDOMECHANICALS) ×2 IMPLANT
TUBE CONNECTING 12X1/4 (SUCTIONS) ×4 IMPLANT
TUBING INSUF HEATED (TUBING) ×2 IMPLANT
TUBING INSUFFLATION (TUBING) IMPLANT
WATER STERILE IRR 1000ML POUR (IV SOLUTION) ×2 IMPLANT
YANKAUER SUCT BULB TIP NO VENT (SUCTIONS) ×4 IMPLANT

## 2017-06-15 NOTE — Anesthesia Preprocedure Evaluation (Signed)
Anesthesia Evaluation  Patient identified by MRN, date of birth, ID band Patient awake    Reviewed: Allergy & Precautions, NPO status , Patient's Chart, lab work & pertinent test results  Airway Mallampati: II  TM Distance: >3 FB Neck ROM: Full    Dental no notable dental hx.    Pulmonary neg pulmonary ROS,    Pulmonary exam normal breath sounds clear to auscultation       Cardiovascular negative cardio ROS Normal cardiovascular exam Rhythm:Regular Rate:Normal     Neuro/Psych negative neurological ROS  negative psych ROS   GI/Hepatic negative GI ROS, Neg liver ROS,   Endo/Other  negative endocrine ROS  Renal/GU negative Renal ROS  negative genitourinary   Musculoskeletal negative musculoskeletal ROS (+)   Abdominal   Peds negative pediatric ROS (+)  Hematology negative hematology ROS (+)   Anesthesia Other Findings Colon Cancer  Reproductive/Obstetrics negative OB ROS                             Anesthesia Physical Anesthesia Plan  ASA: III  Anesthesia Plan: General   Post-op Pain Management:    Induction: Intravenous  PONV Risk Score and Plan: 3 and Ondansetron, Dexamethasone and Midazolam  Airway Management Planned: Oral ETT  Additional Equipment:   Intra-op Plan:   Post-operative Plan: Extubation in OR  Informed Consent: I have reviewed the patients History and Physical, chart, labs and discussed the procedure including the risks, benefits and alternatives for the proposed anesthesia with the patient or authorized representative who has indicated his/her understanding and acceptance.   Dental advisory given  Plan Discussed with: CRNA  Anesthesia Plan Comments:         Anesthesia Quick Evaluation

## 2017-06-15 NOTE — Anesthesia Procedure Notes (Signed)
Procedure Name: Intubation Date/Time: 06/15/2017 11:20 AM Performed by: Renato Shin, CRNA Pre-anesthesia Checklist: Patient identified, Emergency Drugs available, Suction available and Patient being monitored Patient Re-evaluated:Patient Re-evaluated prior to induction Oxygen Delivery Method: Circle system utilized Preoxygenation: Pre-oxygenation with 100% oxygen Induction Type: IV induction, Rapid sequence and Cricoid Pressure applied Laryngoscope Size: Miller and 3 Grade View: Grade I Tube size: 7.0 mm Number of attempts: 1 Airway Equipment and Method: Stylet Placement Confirmation: ETT inserted through vocal cords under direct vision,  positive ETCO2,  CO2 detector and breath sounds checked- equal and bilateral Secured at: 21 cm Tube secured with: Tape Dental Injury: Teeth and Oropharynx as per pre-operative assessment

## 2017-06-15 NOTE — Op Note (Addendum)
Patient Name:           Ariana Long   Date of Surgery:        06/15/2017  Pre op Diagnosis:      Cancer of the hepatic flexure of the colon  Post op Diagnosis:    Same  Procedure:                 Laparoscopic assisted right colectomy  Surgeon:                     Ariana Long, M.D., FACS  Assistant:                     Ariana Long, M.D.   Indication for Assistant: Complex exposure and technique  Operative Indications:   This is a very pleasant 62 year old female from [place]. . She is referred by Dr. Gala Long for surgical management of adenocarcinoma of the  proximal hepatic flexurehe colon Dr. Sharilyn Long is her PCP.      She has no prior colorectal problems other than colon polyps found on prior colonoscopy. She is asymptomatic. She has not seen any blood. Has no abdominal pain. Has not had any weight loss. Stool Hemoccult cards were positive. Colonoscopy was performed on May 19, 2017. In the area of the hepatic flexure there was a 4 x 2 cm sessile appearing lesion behind a fold. This was difficult to biopsy but several fragments were obtained confirming adenocarcinoma. Dr. Gala Long said that the colonic mucosa was tattooed circumferentially 5 cm distal to the hepatic flexure lesion.       Past history reveals 2 cesarean sections through a Pfannenstiel incision. History hepatitis C, now treated. Had liver MRI 2015 which showed early cirrhosis but no portal hypertension. Apparently she had mild thrombocytopenia and splenomegaly in the past. She's had surgery for axillary hidradenitis. Right breast biopsy. Actually looks and feels fairly healthy. She'll be scheduled for laparoscopic-assisted right colectomy, possible open colectomy. . She agrees with this plan.     CT shows 2.9 cm. area of prominence at hepatic flexure. No adenopathy or metastatic disease.  CEA 6.8    Operative Findings:       There was a 3 cm palpable mass in the very  proximal transverse colon.  There was a lot of ink tattoo in the area.  There was Niger ink in some of the mesenteric lymph nodes.  None of the lymph nodes appear pathologically enlarged.  There was no other abnormality.  The liver looked clean on both right and left lobes.  The omentum felt normal.  The liver color and texture looked normal.  There is no peritoneal mass.  She had some chronic adhesions from prior C-section  Procedure in Detail:          Following the induction of general endotracheal anesthesia a Foley catheter was placed.  Oral gastric tube was placed.  The arms were tucked at her sides.  The abdomen was prepped and draped in a sterile fashion.  Surgical timeout was performed.  Intravenous antibiotics were given.  0.5% Marcaine with epinephrine was used as a local infiltration anesthetic.       An 11 mm Hassan trocar was inserted in the midline just above the umbilicus with an open technique.  This was secured with the Purstring suture of 0 Vicryl.  Pneumoperitoneum was created and video camera inserted.  There is no evidence of bleeding or intestinal injury.  I placed 5 mm trochars in the lower midline, right flank, left flank  and epigastrium.  The abdomen and pelvis were explored.  I took down all the adhesions.  I was able to lift the omentum and the small bowel up.  I mobilized the right colon by dividing its lateral lateral peritoneal attachments with the harmonic scalpel.  I did this stepwise until the entire right colon, cecum, and small bowel were mobilized to the midline.  The duodenum was identified and preserved.  The patient was repositioned.  I divided the gastrocolic omentum and completely mobilized the right transverse colon.  I took the hepatic flexure down under direct vision.    After mobilization was complete I removed the camera and released thepneumoperitoneum.  The [facility]  trocar was removed.  I made an 8 cm midline incision from just above the umbilicus cephalad.  The  fascia was incised in the midline.  Wound protector was placed.  Terminal ileum and right colon and transverse colon were easily eviscerated.  I transected the transverse colon 6 or 8 cm distal to the palpable tumor.  This was done with a GIA stapling device.  Terminal ileum was divided with the GIA stapling device.  I widely took the mesentery down.  Smaller vessels were controlled with the LigaSure device and larger vessels were clamped divided and ligated with 2-0 silk ties.  The specimen was removed and sent to the lab.  Hemostasis was good.  I checked very carefully to make sure there was no twisting of the anastomosis.  The anastomosis was set up.  The anastomosis was created using a GIA stapling device.  The common defect was closed with a TA 90 stapling device.  A few silk sutures were placed to reinforce the staple line at critical points.  The anastomosis was widely patent and appeared airtight.  There was no apparent bleeding.  The colon and omentum were returned the abdominal cavity.  Irrigated the abdomen and pelvis with 3 L of saline.       At this point which changed gowns and gloves, instruments and drapes according to protocol.  The midline fascia was closed with a running suture of #1 PDS.  We reinsufflated the abdomen.  There is a little bit of irrigation fluid that we removed but no signs of active bleeding.  Pneumoperitoneum was released.  All the trochars were removed.  The skin incisions were closed with skin staples and protocol bandage.  The patient tolerated the procedure well was taken to PACU in stable condition.  EBL 100-200 mL.  Counts correct.  Complications none.       Addendum: I logged onto the [employer] and reviewed her prescription medication history   [name] M. Dalbert Long, M.D., FACS General and Minimally Invasive Surgery Breast and Colorectal Surgery  06/15/2017 1:28 PM

## 2017-06-15 NOTE — Interval H&P Note (Signed)
History and Physical Interval Note:  06/15/2017 11:04 AM  Ariana Long  has presented today for surgery, with the diagnosis of CANCER, PROXIMAL TRANSVERSE COLON  The various methods of treatment have been discussed with the patient and family. After consideration of risks, benefits and other options for treatment, the patient has consented to  Procedure(s): [place] (N/A) as a surgical intervention .  The patient's history has been reviewed, patient examined, no change in status, stable for surgery.  I have reviewed the patient's chart and labs.  Questions were answered to the patient's satisfaction.     [name]

## 2017-06-15 NOTE — Anesthesia Postprocedure Evaluation (Signed)
Anesthesia Post Note  Patient: Ariana Long  Procedure(s) Performed: LAPAROSCOPIC ASSISTED RIGHT COLECTOMY, ERAS PATHWAY (N/A Abdomen)     Patient location during evaluation: PACU Anesthesia Type: General Level of consciousness: awake and alert Pain management: pain level controlled Vital Signs Assessment: post-procedure vital signs reviewed and stable Respiratory status: spontaneous breathing, nonlabored ventilation and respiratory function stable Cardiovascular status: blood pressure returned to baseline and stable Postop Assessment: no apparent nausea or vomiting Anesthetic complications: no    Last Vitals:  Vitals:   06/15/17 1417 06/15/17 1422  BP: 117/78 113/89  Pulse: 61 62  Resp: 14 13  Temp:  (!) 36.4 C  SpO2: 98% 96%    Last Pain:  Vitals:   06/15/17 1422  PainSc: Asleep                 Lynda Rainwater

## 2017-06-15 NOTE — Transfer of Care (Signed)
Immediate Anesthesia Transfer of Care Note  Patient: Ariana Long  Procedure(s) Performed: LAPAROSCOPIC ASSISTED RIGHT COLECTOMY, ERAS PATHWAY (N/A Abdomen)  Patient Location: PACU  Anesthesia Type:General  Level of Consciousness: awake, alert  and patient cooperative  Airway & Oxygen Therapy: Patient Spontanous Breathing and Patient connected to nasal cannula oxygen  Post-op Assessment: Report given to RN and Post -op Vital signs reviewed and stable  Post vital signs: Reviewed and stable  Last Vitals:  Vitals:   06/15/17 0918 06/15/17 1333  BP: 110/71 107/70  Pulse: 73 72  Resp: 20 18  Temp: 36.6 C   SpO2: 99% 98%    Last Pain:  Vitals:   06/15/17 1333  PainSc: Asleep         Complications: No apparent anesthesia complications

## 2017-06-16 ENCOUNTER — Encounter (HOSPITAL_COMMUNITY): Payer: Self-pay | Admitting: General Surgery

## 2017-06-16 ENCOUNTER — Ambulatory Visit: Payer: 59 | Admitting: Gastroenterology

## 2017-06-16 LAB — BASIC METABOLIC PANEL
Anion gap: 12 (ref 5–15)
BUN: <5 mg/dL — ABNORMAL LOW (ref 6–20)
CO2: 23 mmol/L (ref 22–32)
CREATININE: 0.88 mg/dL (ref 0.44–1.00)
Calcium: 9.1 mg/dL (ref 8.9–10.3)
Chloride: 107 mmol/L (ref 101–111)
GFR calc non Af Amer: >60 mL/min (ref >60)
Glucose, Bld: 126 mg/dL — ABNORMAL HIGH (ref 65–99)
Potassium: 4.1 mmol/L (ref 3.5–5.1)
Sodium: 142 mmol/L (ref 135–145)

## 2017-06-16 LAB — CBC
HCT: 29.8 % — ABNORMAL LOW (ref 36.0–46.0)
Hemoglobin: 9.4 g/dL — ABNORMAL LOW (ref 12.0–15.0)
MCH: 27.4 pg (ref 26.0–34.0)
MCHC: 31.5 g/dL (ref 30.0–36.0)
MCV: 86.9 fL (ref 78.0–100.0)
PLATELETS: 160 10*3/uL (ref 150–400)
RBC: 3.43 MIL/uL — AB (ref 3.87–5.11)
RDW: 14.4 % (ref 11.5–15.5)
WBC: 8.3 10*3/uL (ref 4.0–10.5)

## 2017-06-16 MED ORDER — MORPHINE SULFATE (PF) 4 MG/ML IV SOLN: 1.0000 mg | INTRAVENOUS | Status: DC | PRN

## 2017-06-16 NOTE — Progress Notes (Signed)
1 Day Post-Op  Subjective: Alert and stable.  Only mild distress from pain and nausea Says she gets nauseated when she takes dilaudid.   Hydrocodone does not cause nausea. Excellent urine output Hemoglobin 9.4.  Was 11 preop. Operative findings discussed with patient and her sister.  Objective: Vital signs in last 24 hours: Temp:  [97.3 F (36.3 C)-99.5 F (37.5 C)] 99.5 F (37.5 C) (02/13 0229) Pulse Rate:  [61-81] 81 (02/13 0229) Resp:  [11-20] 18 (02/13 0229) BP: (107-118)/(62-89) 115/62 (02/13 0229) SpO2:  [93 %-99 %] 98 % (02/13 0229) Weight:  [68.7 kg (151 lb 7.3 oz)] 68.7 kg (151 lb 7.3 oz) (02/12 1439) Last BM Date: 06/14/17  Intake/Output from previous day: 02/12 0701 - 02/13 0700 In: 6169.6 [P.O.:780; I.V.:5289.6; IV Piggyback:100] Out: 2125 [Urine:1925; Blood:200] Intake/Output this shift: Total I/O In: 1800.4 [P.O.:540; I.V.:1160.4; IV Piggyback:100] Out: 1300 [Urine:1300]  General appearance: Alert.  Pleasant and cooperative.  Minimal distress.  Mental status normal Resp: clear to auscultation bilaterally GI: Abdomen soft.  Nondistended.  Wounds clean.  Minimal bowel sounds.  Benign postop exam Extremities: extremities normal, atraumatic, no cyanosis or edema  Lab Results:  Results for orders placed or performed during the hospital encounter of 06/15/17 (from the past 24 hour(s))  CBC     Status: Abnormal   Collection Time: 06/15/17  3:21 PM  Result Value Ref Range   WBC 11.1 (H) 4.0 - 10.5 K/uL   RBC 3.54 (L) 3.87 - 5.11 MIL/uL   Hemoglobin 9.7 (L) 12.0 - 15.0 g/dL   HCT 31.2 (L) 36.0 - 46.0 %   MCV 88.1 78.0 - 100.0 fL   MCH 27.4 26.0 - 34.0 pg   MCHC 31.1 30.0 - 36.0 g/dL   RDW 14.5 11.5 - 15.5 %   Platelets 182 150 - 400 K/uL  Creatinine, serum     Status: None   Collection Time: 06/15/17  3:21 PM  Result Value Ref Range   Creatinine, Ser 0.87 0.44 - 1.00 mg/dL   GFR calc non Af Amer >60 >60 mL/min   GFR calc Af Amer >60 >60 mL/min  CBC      Status: Abnormal   Collection Time: 06/16/17  5:38 AM  Result Value Ref Range   WBC 8.3 4.0 - 10.5 K/uL   RBC 3.43 (L) 3.87 - 5.11 MIL/uL   Hemoglobin 9.4 (L) 12.0 - 15.0 g/dL   HCT 29.8 (L) 36.0 - 46.0 %   MCV 86.9 78.0 - 100.0 fL   MCH 27.4 26.0 - 34.0 pg   MCHC 31.5 30.0 - 36.0 g/dL   RDW 14.4 11.5 - 15.5 %   Platelets 160 150 - 400 K/uL     Studies/Results: No results found.  Marland Kitchen acidophilus  1 capsule Oral Daily  . alvimopan  12 mg Oral BID  . enoxaparin (LOVENOX) injection  40 mg Subcutaneous Q24H  . pantoprazole (PROTONIX) IV  40 mg Intravenous QHS  . cyanocobalamin  500 mcg Oral Daily     Assessment/Plan: s/p Procedure(s): LAPAROSCOPIC ASSISTED RIGHT COLECTOMY, ERAS PATHWAY  POD#1  - Laparoscopic assisted right colectomy Stable Continue clear liquids as tolerated Remove Foley Discontinue Dilaudid and substitute morphine.  Continue hydrocodone as needed Increase ambulation entereg protocol  Lovenox - DVT prophylaxis Await pathology  History of hepatitis C - treated - Anemia -  acute blood loss anemia superimposed on chronic anemia - tolerating well-  @PROBHOSP @  LOS: 1 day    Coretha Creswell M Kirtan Sada 06/16/2017  . Marland Kitchen  prob

## 2017-06-17 LAB — BASIC METABOLIC PANEL
Anion gap: 8 (ref 5–15)
BUN: <5 mg/dL — ABNORMAL LOW (ref 6–20)
CHLORIDE: 106 mmol/L (ref 101–111)
CO2: 27 mmol/L (ref 22–32)
CREATININE: 0.87 mg/dL (ref 0.44–1.00)
Calcium: 8.8 mg/dL — ABNORMAL LOW (ref 8.9–10.3)
GFR calc Af Amer: >60 mL/min (ref >60)
GFR calc non Af Amer: >60 mL/min (ref >60)
GLUCOSE: 105 mg/dL — AB (ref 65–99)
POTASSIUM: 3.7 mmol/L (ref 3.5–5.1)
Sodium: 141 mmol/L (ref 135–145)

## 2017-06-17 LAB — CBC
HCT: 28.5 % — ABNORMAL LOW (ref 36.0–46.0)
Hemoglobin: 8.9 g/dL — ABNORMAL LOW (ref 12.0–15.0)
MCH: 27.6 pg (ref 26.0–34.0)
MCHC: 31.2 g/dL (ref 30.0–36.0)
MCV: 88.2 fL (ref 78.0–100.0)
PLATELETS: 127 10*3/uL — AB (ref 150–400)
RBC: 3.23 MIL/uL — AB (ref 3.87–5.11)
RDW: 14.8 % (ref 11.5–15.5)
WBC: 5.6 10*3/uL (ref 4.0–10.5)

## 2017-06-17 LAB — URINALYSIS, ROUTINE W REFLEX MICROSCOPIC
BILIRUBIN URINE: NEGATIVE
GLUCOSE, UA: NEGATIVE mg/dL
Hgb urine dipstick: NEGATIVE
Ketones, ur: NEGATIVE mg/dL
Leukocytes, UA: NEGATIVE
Nitrite: NEGATIVE
Protein, ur: NEGATIVE mg/dL
SPECIFIC GRAVITY, URINE: 1.005 (ref 1.005–1.030)
pH: 7 (ref 5.0–8.0)

## 2017-06-17 NOTE — Progress Notes (Signed)
2 Days Post-Op  Subjective: Overall doing well.  No more nausea or vomiting.  No stool or flatus.  Tolerating clear liquids.  Has some right sided abdominal pain steady ambulating in halls. No respiratory complaints. Voiding uneventfully but says she has a little bit of dysuria and her urine has some odor.  Wants her urine to be checked which we will do.  Hemoglobin stable.  8.9.  WBC 5600.  Potassium 3.7.  Creatinine 0.87.  Objective: Vital signs in last 24 hours: Temp:  [98 F (36.7 C)-98.3 F (36.8 C)] 98.2 F (36.8 C) (02/14 0410) Pulse Rate:  [61-68] 65 (02/14 0410) Resp:  [16-18] 16 (02/14 0410) BP: (109-123)/(56-76) 116/56 (02/14 0410) SpO2:  [91 %-98 %] 95 % (02/14 0410) Last BM Date: 06/14/17  Intake/Output from previous day: 02/13 0701 - 02/14 0700 In: 2963.3 [P.O.:600; I.V.:2363.3] Out: -  Intake/Output this shift: Total I/O In: 1220 [P.O.:120; I.V.:1100] Out: -   General appearance: Alert.  Cooperative.  No distress.  Skin warm and dry Resp: clear to auscultation bilaterally GI: Abdomen is very soft.  Nondistended.  Wounds look good.  No significant tenderness anywhere other than midline incision.  Hypoactive bowel sounds. Extremities: extremities normal, atraumatic, no cyanosis or edema  Lab Results:  Results for orders placed or performed during the hospital encounter of 06/15/17 (from the past 24 hour(s))  CBC     Status: Abnormal   Collection Time: 06/17/17  5:41 AM  Result Value Ref Range   WBC 5.6 4.0 - 10.5 K/uL   RBC 3.23 (L) 3.87 - 5.11 MIL/uL   Hemoglobin 8.9 (L) 12.0 - 15.0 g/dL   HCT 28.5 (L) 36.0 - 46.0 %   MCV 88.2 78.0 - 100.0 fL   MCH 27.6 26.0 - 34.0 pg   MCHC 31.2 30.0 - 36.0 g/dL   RDW 14.8 11.5 - 15.5 %   Platelets 127 (L) 150 - 400 K/uL  Basic metabolic panel     Status: Abnormal   Collection Time: 06/17/17  5:41 AM  Result Value Ref Range   Sodium 141 135 - 145 mmol/L   Potassium 3.7 3.5 - 5.1 mmol/L   Chloride 106 101 - 111  mmol/L   CO2 27 22 - 32 mmol/L   Glucose, Bld 105 (H) 65 - 99 mg/dL   BUN <5 (L) 6 - 20 mg/dL   Creatinine, Ser 0.87 0.44 - 1.00 mg/dL   Calcium 8.8 (L) 8.9 - 10.3 mg/dL   GFR calc non Af Amer >60 >60 mL/min   GFR calc Af Amer >60 >60 mL/min   Anion gap 8 5 - 15     Studies/Results: No results found.  Marland Kitchen acidophilus  1 capsule Oral Daily  . alvimopan  12 mg Oral BID  . enoxaparin (LOVENOX) injection  40 mg Subcutaneous Q24H  . pantoprazole (PROTONIX) IV  40 mg Intravenous QHS  . cyanocobalamin  500 mcg Oral Daily     Assessment/Plan: s/p Procedure(s): LAPAROSCOPIC ASSISTED RIGHT COLECTOMY, ERAS PATHWAY  POD#2  - Laparoscopic assisted right colectomy Stable Advance to full liquids  Increase ambulation entereg protocol  Lovenox - DVT prophylaxis Await pathology  Dysuria- Check UA and urine culture   History of hepatitis C - treated - Anemia -  acute blood loss anemia superimposed on chronic anemia - tolerating well.  No clinical evidence of active bleeding.    @PROBHOSP @  LOS: 2 days    [name] 06/17/2017  . .prob

## 2017-06-18 LAB — URINE CULTURE: Culture: NO GROWTH

## 2017-06-18 MED ORDER — FERROUS SULFATE 325 (65 FE) MG PO TABS: 325.0000 mg | ORAL_TABLET | Freq: Two times a day (BID) | ORAL | 1 refills | Status: DC

## 2017-06-18 MED ORDER — HYDROCODONE-ACETAMINOPHEN 5-325 MG PO TABS: 1.0000 | ORAL_TABLET | ORAL | 0 refills | Status: DC | PRN

## 2017-06-18 MED ORDER — FERROUS SULFATE 325 (65 FE) MG PO TABS
325.0000 mg | ORAL_TABLET | Freq: Two times a day (BID) | ORAL | Status: DC
  Administered 2017-06-18 – 2017-06-19 (×3): 325 mg via ORAL
  Filled 2017-06-18 (×3): qty 1

## 2017-06-18 NOTE — Progress Notes (Addendum)
3 Days Post-Op  Subjective: Stable and alert.  Satisfactory progress Tolerating full liquid diet Had a small bowel movement and passing some flatus No nausea Pain controlled Ambulatory  Urinalysis clean.  No evidence of infection  Pathology still pending.  Hopefully out today  Objective: Vital signs in last 24 hours: Temp:  [98.5 F (36.9 C)-99.1 F (37.3 C)] 98.7 F (37.1 C) (02/15 0457) Pulse Rate:  [61-66] 61 (02/15 0457) Resp:  [15-16] 15 (02/15 0457) BP: (122-133)/(63-70) 133/63 (02/15 0457) SpO2:  [97 %-99 %] 97 % (02/15 0457) Last BM Date: 06/18/17  Intake/Output from previous day: 02/14 0701 - 02/15 0700 In: 1325 [P.O.:425; I.V.:900] Out: 1501 [Urine:1500; Stool:1] Intake/Output this shift: Total I/O In: -  Out: 500 [Urine:500]  General appearance: Alert.  Ambulating independently.  No distress whatsoever Resp: clear to auscultation bilaterally GI: Abdomen soft.  Nondistended.  Hypoactive bowel sounds.  Wound is clean.  Benign postop exam Extremities: extremities normal, atraumatic, no cyanosis or edema  Lab Results:  Results for orders placed or performed during the hospital encounter of 06/15/17 (from the past 24 hour(s))  Urinalysis, Routine w reflex microscopic     Status: Abnormal   Collection Time: 06/17/17  7:28 AM  Result Value Ref Range   Color, Urine STRAW (A) YELLOW   APPearance CLEAR CLEAR   Specific Gravity, Urine 1.005 1.005 - 1.030   pH 7.0 5.0 - 8.0   Glucose, UA NEGATIVE NEGATIVE mg/dL   Hgb urine dipstick NEGATIVE NEGATIVE   Bilirubin Urine NEGATIVE NEGATIVE   Ketones, ur NEGATIVE NEGATIVE mg/dL   Protein, ur NEGATIVE NEGATIVE mg/dL   Nitrite NEGATIVE NEGATIVE   Leukocytes, UA NEGATIVE NEGATIVE     Studies/Results: No results found.  Marland Kitchen acidophilus  1 capsule Oral Daily  . alvimopan  12 mg Oral BID  . enoxaparin (LOVENOX) injection  40 mg Subcutaneous Q24H  . ferrous sulfate  325 mg Oral BID WC  . pantoprazole (PROTONIX) IV  40  mg Intravenous QHS  . cyanocobalamin  500 mcg Oral Daily     Assessment/Plan: s/p Procedure(s): LAPAROSCOPIC ASSISTED RIGHT COLECTOMY, ERAS PATHWAY    POD#3 -Laparoscopic assisted right colectomy Stable Advance to soft diet Change dressing home tomorrow; RTO 1 week for staples Rx's for Norco and FeSO4 in drawer outside room Await pathology-hope to call report to patient today if completed.  Urinalysis normal.  No intervention  History of hepatitis C-treated-  Anemia-acute blood loss anemia superimposed on chronic anemia-tolerating Hgb 8.9 well.  No clinical evidence of active bleeding. Start FeSo4      @PROBHOSP @  LOS: 3 days    [name] 06/18/2017  . .prob

## 2017-06-19 NOTE — Discharge Summary (Signed)
Physician Discharge Summary  Patient ID: Ariana Long MRN: [id number] DOB/AGE: 1955-11-30 62 y.o.  Admit date: 06/15/2017 Discharge date: 06/19/2017  Admission Diagnoses: colon cancer  Discharge Diagnoses:  Principal Problem:   Primary cancer of hepatic flexure of colon [facility])   Discharged Condition: good  Hospital Course: Pt admitted after L R colectomy.  Her diet was advanced as tolerated.  She was discharged to home when tolerating a diet and PO pain meds and having bowel function.  She will f/u with Dr Dalbert Batman and Oncology as an outpatient.   Consults: None  Significant Diagnostic Studies: labs: cbc, bmet  Treatments: IV hydration, analgesia: Vicodin and surgery: lap R colectomy  Discharge Exam: Blood pressure 130/86, pulse 86, temperature 99 F (37.2 C), temperature source Oral, resp. rate 17, height 5' 5" (1.651 m), weight 68.7 kg (151 lb 7.3 oz), SpO2 96 %. General appearance: alert and cooperative GI: normal findings: soft, non-tender Incision/Wound: clean, dry, intact  Disposition: 01-Home or Self Care  Discharge Instructions    Call MD for:  difficulty breathing, headache or visual disturbances   Complete by:  As directed    Call MD for:  hives   Complete by:  As directed    Call MD for:  persistant dizziness or light-headedness   Complete by:  As directed    Call MD for:  persistant nausea and vomiting   Complete by:  As directed    Call MD for:  redness, tenderness, or signs of infection (pain, swelling, redness, odor or green/yellow discharge around incision site)   Complete by:  As directed    Call MD for:  severe uncontrolled pain   Complete by:  As directed    Call MD for:  temperature >100.4   Complete by:  As directed    Diet - low sodium heart healthy   Complete by:  As directed    Discharge wound care:   Complete by:  As directed    You may take a shower but no tub baths. Pat the wound dry and cover with light gauze bandage  Make an appointment  with Dr. Darrel Hoover office at the end of next week to get the staples removed   Driving Restrictions   Complete by:  As directed    2 weeks   Increase activity slowly   Complete by:  As directed    Lifting restrictions   Complete by:  As directed    20 lbs   May shower / Bathe   Complete by:  As directed    May walk up steps   Complete by:  As directed      Allergies as of 06/19/2017   No Known Allergies     Medication List    STOP taking these medications   fluconazole 100 MG tablet Commonly known as:  DIFLUCAN   hydrocortisone 2.5 % rectal cream Commonly known as:  ANUSOL-HC   Probiotic Caps   sulfamethoxazole-trimethoprim 800-160 MG tablet Commonly known as:  BACTRIM DS,SEPTRA DS     TAKE these medications   BENEFIBER Powd Take 1 Scoop by mouth 2 (two) times a week.   CALCIUM-MAGNESIUM PO Take 1 tablet by mouth 2 (two) times a week.   Cholecalciferol 2000 units Tabs Take 6,000 Units by mouth daily.   clobetasol ointment 0.05 % Commonly known as:  TEMOVATE Use 2-3 x weekly What changed:    how much to take  how to take this  when to take this  additional  instructions   CoQ10 100 MG Caps Take 100 mg by mouth daily.   cyanocobalamin 500 MCG tablet Take 500 mcg by mouth daily.   ferrous sulfate 325 (65 FE) MG tablet Take 1 tablet (325 mg total) by mouth 2 (two) times daily with a meal.   Fish Oil 1200 MG Caps Take 1,200 mg by mouth daily.   HAIR SKIN AND NAILS FORMULA Tabs Take 2 tablets by mouth daily. 2500 mcg per serving   ADULT GUMMY PO Take 2 tablets by mouth daily.   HYDROcodone-acetaminophen 5-325 MG tablet Commonly known as:  NORCO/VICODIN Take 1-2 tablets by mouth every 4 (four) hours as needed for moderate pain.   ibuprofen 200 MG tablet Commonly known as:  ADVIL,MOTRIN Take 200 mg by mouth daily as needed for headache or moderate pain.   milk thistle 175 MG tablet Take 175 mg by mouth daily.   Na Sulfate-K Sulfate-Mg Sulf  17.5-3.13-1.6 GM/177ML Soln Take 1 kit by mouth as directed.   naproxen sodium 220 MG tablet Commonly known as:  ALEVE Take 220 mg by mouth daily as needed (pain).   nitrofurantoin (macrocrystal-monohydrate) 100 MG capsule Commonly known as:  MACROBID Take 1 capsule (100 mg total) by mouth 2 (two) times daily.   Selenium 100 MCG Tabs Take 100 mcg by mouth daily.   vitamin C 500 MG tablet Commonly known as:  ASCORBIC ACID Take 500 mg by mouth 2 (two) times a week.            Discharge Care Instructions  (From admission, onward)        Start     Ordered   06/18/17 0000  Discharge wound care:    Comments:  You may take a shower but no tub baths. Pat the wound dry and cover with light gauze bandage  Make an appointment with Dr. Darrel Hoover office at the end of next week to get the staples removed   06/18/17 0659     Follow-up Information    Fanny Skates, MD. Schedule an appointment as soon as possible for a visit in 1 week(s).   Specialty:  General Surgery Why:  for staple removal Contact information: [address] [place] [address] [phone]           Signed: Rosario Adie 12/03/6551, 7:48 AM

## 2017-06-19 NOTE — Discharge Instructions (Signed)
[facility]      [facility] [phone]  OPEN ABDOMINAL SURGERY: POST OP INSTRUCTIONS  Always review your discharge instruction sheet given to you by the facility where your surgery was performed.  IF YOU HAVE DISABILITY OR FAMILY LEAVE FORMS, YOU MUST BRING THEM TO THE OFFICE FOR PROCESSING.  PLEASE DO NOT GIVE THEM TO YOUR DOCTOR.  1. A prescription for pain medication may be given to you upon discharge.  Take your pain medication as prescribed, if needed.  If narcotic pain medicine is not needed, then you may take acetaminophen (Tylenol) or ibuprofen (Advil) as needed. 2. Take your usually prescribed medications unless otherwise directed. 3. If you need a refill on your pain medication, please contact your pharmacy. They will contact our office to request authorization.  Prescriptions will not be filled after 5pm or on week-ends. 4. You should follow a light diet the first few days after arrival home, such as soup and crackers, pudding, etc.unless your doctor has advised otherwise. A high-fiber, low fat diet can be resumed as tolerated.   Be sure to include lots of fluids daily. Most patients will experience some swelling and bruising on the chest and neck area.  Ice packs will help.  Swelling and bruising can take several days to resolve 5. Most patients will experience some swelling and bruising in the area of the incision. Ice pack will help. Swelling and bruising can take several days to resolve..  6. It is common to experience some constipation if taking pain medication after surgery.  Increasing fluid intake and taking a stool softener will usually help or prevent this problem from occurring.  A mild laxative (Milk of Magnesia or Miralax) should be taken according to package directions if there are no bowel movements after 48 hours. 7.  You may have steri-strips (small skin tapes) in place directly over the incision.  These strips should be left on the skin for 7-10 days.  If your  surgeon used skin glue on the incision, you may shower in 24 hours.  The glue will flake off over the next 2-3 weeks.  Any sutures or staples will be removed at the office during your follow-up visit. You may find that a light gauze bandage over your incision may keep your staples from being rubbed or pulled. You may shower and replace the bandage daily. 8. ACTIVITIES:  You may resume regular (light) daily activities beginning the next day--such as daily self-care, walking, climbing stairs--gradually increasing activities as tolerated.  You may have sexual intercourse when it is comfortable.  Refrain from any heavy lifting or straining until approved by your doctor. a. You may drive when you no longer are taking prescription pain medication, you can comfortably wear a seatbelt, and you can safely maneuver your car and apply brakes b. Return to Work: ___________________________________ 9. You should see your doctor in the office for a follow-up appointment approximately two weeks after your surgery.  Make sure that you call for this appointment within a day or two after you arrive home to insure a convenient appointment time. OTHER INSTRUCTIONS:  _____________________________________________________________ _____________________________________________________________  WHEN TO CALL YOUR DOCTOR: 1. Fever over 101.0 2. Inability to urinate 3. Nausea and/or vomiting 4. Extreme swelling or bruising 5. Continued bleeding from incision. 6. Increased pain, redness, or drainage from the incision. 7. Difficulty swallowing or breathing 8. Muscle cramping or spasms. 9. Numbness or tingling in hands or feet or around lips.  The clinic staff is available to   answer your questions during regular business hours.  Please don't hesitate to call and ask to speak to one of the nurses if you have concerns.  For further questions, please visit [email or web address]   

## 2017-06-19 NOTE — Progress Notes (Addendum)
Discharge instructions given to Pt and her husband. Pt has appointment to f/u with MD/ Pt given her instruction and prescriptions. She has all of her belongings. Pt ambulating to bathroom PIV removed  Pt ambulated off the unit with staff

## 2017-06-28 ENCOUNTER — Encounter: Payer: Self-pay | Admitting: Hematology

## 2017-06-28 ENCOUNTER — Telehealth: Payer: Self-pay | Admitting: Hematology

## 2017-06-28 NOTE — Telephone Encounter (Signed)
Appt has been scheduled for the pt to see Dr. Burr Medico on 3/15 at 230pm. Pt aware to arrive 30 minutes early. Letter mailed.

## 2017-07-05 ENCOUNTER — Encounter (HOSPITAL_COMMUNITY): Payer: Self-pay | Admitting: *Deleted

## 2017-07-05 ENCOUNTER — Emergency Department (HOSPITAL_COMMUNITY)
Admission: EM | Admit: 2017-07-05 | Discharge: 2017-07-05 | Disposition: A | Discharge status: Home / Self Care | Payer: 59 | Attending: Emergency Medicine | Admitting: Emergency Medicine

## 2017-07-05 ENCOUNTER — Other Ambulatory Visit: Payer: Self-pay

## 2017-07-05 ENCOUNTER — Emergency Department (HOSPITAL_COMMUNITY): Payer: 59

## 2017-07-05 DIAGNOSIS — R0602 Shortness of breath: Secondary | ICD-10-CM | POA: Insufficient documentation

## 2017-07-05 DIAGNOSIS — Z5321 Procedure and treatment not carried out due to patient leaving prior to being seen by health care provider: Secondary | ICD-10-CM | POA: Insufficient documentation

## 2017-07-05 LAB — BASIC METABOLIC PANEL
ANION GAP: 10 (ref 5–15)
BUN: 12 mg/dL (ref 6–20)
CHLORIDE: 108 mmol/L (ref 101–111)
CO2: 22 mmol/L (ref 22–32)
Calcium: 9.7 mg/dL (ref 8.9–10.3)
Creatinine, Ser: 0.81 mg/dL (ref 0.44–1.00)
GFR calc Af Amer: >60 mL/min (ref >60)
GFR calc non Af Amer: >60 mL/min (ref >60)
Glucose, Bld: 96 mg/dL (ref 65–99)
POTASSIUM: 3.7 mmol/L (ref 3.5–5.1)
Sodium: 140 mmol/L (ref 135–145)

## 2017-07-05 LAB — CBC
HEMATOCRIT: 35.3 % — AB (ref 36.0–46.0)
HEMOGLOBIN: 11.6 g/dL — AB (ref 12.0–15.0)
MCH: 29.2 pg (ref 26.0–34.0)
MCHC: 32.9 g/dL (ref 30.0–36.0)
MCV: 88.9 fL (ref 78.0–100.0)
Platelets: 211 10*3/uL (ref 150–400)
RBC: 3.97 MIL/uL (ref 3.87–5.11)
RDW: 15.7 % — ABNORMAL HIGH (ref 11.5–15.5)
WBC: 4.6 10*3/uL (ref 4.0–10.5)

## 2017-07-05 LAB — TROPONIN I: Troponin I: <0.03 ng/mL (ref <0.03)

## 2017-07-05 NOTE — ED Triage Notes (Signed)
The pt is c/o sob and feeling lightheaded since last night.  She had colon surgery for cancer  Feb 12th

## 2017-07-05 NOTE — ED Notes (Signed)
Pt states she has waited as long as she can, advised patient to allow [place] to collect second troponin, patient states she cannot wait. NAD. Leaving department.

## 2017-07-06 ENCOUNTER — Encounter: Payer: Self-pay | Admitting: Internal Medicine

## 2017-07-14 ENCOUNTER — Encounter (HOSPITAL_COMMUNITY): Payer: Self-pay | Admitting: Hematology

## 2017-07-14 ENCOUNTER — Inpatient Hospital Stay (HOSPITAL_COMMUNITY): Payer: 59 | Attending: Hematology | Admitting: Hematology

## 2017-07-14 ENCOUNTER — Inpatient Hospital Stay (HOSPITAL_COMMUNITY): Payer: 59

## 2017-07-14 VITALS — BP 122/71 | HR 64 | Temp 97.7°F | Resp 16 | Ht 65.5 in | Wt 147.8 lb

## 2017-07-14 DIAGNOSIS — M818 Other osteoporosis without current pathological fracture: Secondary | ICD-10-CM | POA: Diagnosis not present

## 2017-07-14 DIAGNOSIS — C183 Malignant neoplasm of hepatic flexure: Secondary | ICD-10-CM

## 2017-07-14 DIAGNOSIS — D509 Iron deficiency anemia, unspecified: Secondary | ICD-10-CM | POA: Diagnosis not present

## 2017-07-14 DIAGNOSIS — D500 Iron deficiency anemia secondary to blood loss (chronic): Secondary | ICD-10-CM

## 2017-07-14 LAB — FERRITIN: Ferritin: 27 ng/mL (ref 11–307)

## 2017-07-14 LAB — CBC
HCT: 36.8 % (ref 36.0–46.0)
HEMOGLOBIN: 11.6 g/dL — AB (ref 12.0–15.0)
MCH: 28.2 pg (ref 26.0–34.0)
MCHC: 31.5 g/dL (ref 30.0–36.0)
MCV: 89.3 fL (ref 78.0–100.0)
Platelets: 160 10*3/uL (ref 150–400)
RBC: 4.12 MIL/uL (ref 3.87–5.11)
RDW: 15.3 % (ref 11.5–15.5)
WBC: 3.7 10*3/uL — AB (ref 4.0–10.5)

## 2017-07-14 LAB — IRON AND TIBC
IRON: 33 ug/dL (ref 28–170)
SATURATION RATIOS: 9 % — AB (ref 10.4–31.8)
TIBC: 354 ug/dL (ref 250–450)
UIBC: 321 ug/dL

## 2017-07-14 NOTE — Patient Instructions (Signed)
[facility] Discharge Instructions  You saw Dr. K today.   Thank you for choosing [facility] to provide your oncology and hematology care.  To afford each patient quality time with our provider, please arrive at least 15 minutes before your scheduled appointment time.   If you have a lab appointment with the Cancer Center please come in thru the  Main Entrance and check in at the main information desk  You need to re-schedule your appointment should you arrive 10 or more minutes late.  We strive to give you quality time with our providers, and arriving late affects you and other patients whose appointments are after yours.  Also, if you no show three or more times for appointments you may be dismissed from the clinic at the providers discretion.     Again, thank you for choosing [facility].  Our hope is that these requests will decrease the amount of time that you wait before being seen by our physicians.       _____________________________________________________________  Should you have questions after your visit to [facility], please contact our office at [phone] between the hours of 8:30 a.m. and 4:30 p.m.  Voicemails left after 4:30 p.m. will not be returned until the following business day.  For prescription refill requests, have your pharmacy contact our office.       Resources For Cancer Patients and their Caregivers ? American Cancer Society: Can assist with transportation, wigs, general needs, runs Look Good Feel Better.        1-888-227-6333 ? Cancer Care: Provides financial assistance, online support groups, medication/co-pay assistance.  1-800-813-HOPE (4673) ? [facility] Assists [place] cancer patients and their families through emotional , educational and financial support.  336-427-4357 ? [place] DSS Where to apply for food  stamps, Medicaid and utility assistance. 336-342-1394 ? [facility]: Transportation to medical appointments. 336-347-2287 ? Social Security Administration: May apply for disability if have a Stage IV cancer. 336-342-7796 1-800-772-1213 ? [place] Aging, Disability and Transit Services: Assists with nutrition, care and transit needs. 336-349-2343  Cancer Center Support Programs:   > Cancer Support Group  2nd Tuesday of the month 1pm-2pm, Journey Room   > Creative Journey  3rd Tuesday of the month 1130am-1pm, Journey Room     

## 2017-07-14 NOTE — Progress Notes (Signed)
[facility] NOTE  Patient Care Team: Sharilyn Sites, MD as PCP - General (Family Medicine) Gala Romney Cristopher Estimable, MD (Gastroenterology)  CHIEF COMPLAINTS/PURPOSE OF CONSULTATION:  Newly diagnosed colon cancer.  HISTORY OF PRESENTING ILLNESS:  Ariana Long 62 y.o. female is seen in consultation today for newly diagnosed colon cancer.  She was found to have anemia and positive stool cards.  She underwent colonoscopy on 05/19/2017 which showed a 4 x 2 cm sessile appearing lesion in the hepatic flexure.  Biopsy of this was consistent with adenocarcinoma.  She underwent laparoscopic-assisted right colectomy on 06/15/2017 and recovering well from surgery.  A CT scan was done prior to surgery on 06/08/2017 did not show any evidence of hepatic metastatic disease.  She lost about 20 pounds since January of this year.  She skipped one meal a day in January as part of a program at her discharge.  Her appetite is improving.  She had vague pain in the right mid quadrant since surgery on and off but it is getting better.  She lives at home with her husband and is independent of all ADLs and IADLs.  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  MEDICAL HISTORY:  Past Medical History:  Diagnosis Date  . Cirrhosis of liver ([place]) 01/26/2011   FIBROSIS on liver biopsy in 2007, U/S  03/31/2013= mild diffuse hepatic steatosis and /or hepatocellular disease without focal hepatic parenchymal abnormality. per Dr. Patsy [place] (2012) pt is immune to hep A and Hep B. per 12/06/13 note from Liver clinic-pt had MRI which showed cirrhosis 2015.  . Diverticulitis of colon   . Elevated AFP 01/26/2011  . Gall stones   . Hematuria 12/12/2014  . Hemorrhoid 12/01/2012  . Hemorrhoids   . Hepatitis C 01/26/2011   2005 non responder, genotype 1, Gr 1-2, Stage 3 . Treated with Harvoni at the Liver clinic, responder  . Hidradenitis   . History of hiatal hernia   . Hx: UTI (urinary tract infection)   . Kidney stones  10/2011  . Osteoporosis   . Primary cancer of hepatic flexure of colon ([place]) 06/15/2017  . Serrated adenoma of colon 11/2011  . Splenomegaly 01/26/2011  . Thrombocytopenia ([place]) 01/26/2011  . Tubular adenoma   . Vulval lesion 12/14/2013   Has kissing lesions thin and puffy and paler i color about 5-6- mm in diameter.Dr Glo Herring in to co examine will try temovate and recheck in 3 months    SURGICAL HISTORY: Past Surgical History:  Procedure Laterality Date  . AXILLARY SURGERY Bilateral 1979   "glands removed"  . BREAST BIOPSY Right 2011  . BREAST EXCISIONAL BIOPSY Bilateral 25 years ago   Benign, fatty tumors  . CESAREAN SECTION     x 2  . COLECTOMY  06/15/2017   LAPAROSCOPIC ASSISTED RIGHT COLECTOMY, ERAS PATHWAY/notes 06/15/2017  . COLONOSCOPY  07/31/2005   Rourk-Normal rectum/Normal colonoscopy/Repeat screening colonoscopy ten years  . COLONOSCOPY  11/16/2011   RMR: Friable anorectum-likely source of hematochezia/ LARGE 1.2x2.5CM SERRATED ADENOMA resected from ascending colon 7CM DISTAL TO ICV, hyperplastic polyp removed   . COLONOSCOPY N/A 07/18/2012   Dr. Gala Romney- normal appearing rectal mucosa. no residual polpy tissue seen at tatoo location. remainder of colonic mucosa appeared entirely normal. bx = tubular adenoma  . COLONOSCOPY N/A 08/29/2015   Dr. Gala Romney: single 12 mm polyp in ascending colon, 4 mm polyp ascending. Sessile serrated adenoma. 3 year surveillance  . COLONOSCOPY N/A 05/19/2017   Procedure: COLONOSCOPY;  Surgeon:  Rourk, Cristopher Estimable, MD;  [facility];  Service: Endoscopy;  Laterality: N/A;  8:30am  . ESOPHAGEAL BANDING N/A 08/29/2015   Procedure: ESOPHAGEAL BANDING;  Surgeon: Daneil Dolin, MD;  [facility];  Service: Endoscopy;  Laterality: N/A;  . ESOPHAGOGASTRODUODENOSCOPY N/A 08/29/2015   Dr. Gala Romney: non-critical Schatzki's ring, somewhat prominent small submucosal vessels without obvious varices. hyperplastic gastric polyp. 2 year surveillance  .  HEMORRHOID BANDING    . LAPAROSCOPIC RIGHT COLECTOMY N/A 06/15/2017   Procedure: LAPAROSCOPIC ASSISTED RIGHT COLECTOMY, ERAS PATHWAY;  Surgeon: Fanny Skates, MD;  Location: [facility];  Service: General;  Laterality: N/A;  . POLYPECTOMY  05/19/2017   Procedure: POLYPECTOMY;  Surgeon: Daneil Dolin, MD;  [facility];  Service: Endoscopy;;  . SKIN LESION EXCISION      SOCIAL HISTORY: Social History   Socioeconomic History  . Marital status: Married    Spouse name: Not on file  . Number of children: 2  . Years of education: Not on file  . Highest education level: Not on file  Social Needs  . Financial resource strain: Not hard at all  . Food insecurity - worry: Never true  . Food insecurity - inability: Never true  . Transportation needs - medical: No  . Transportation needs - non-medical: No  Occupational History  . Occupation: Occupational psychologist: [employer]  Tobacco Use  . Smoking status: Never Smoker  . Smokeless tobacco: Never Used  Substance and Sexual Activity  . Alcohol use: No  . Drug use: No  . Sexual activity: Yes    Birth control/protection: Post-menopausal  Other Topics Concern  . Not on file  Social History Narrative  . Not on file    FAMILY HISTORY: Family History  Problem Relation Age of Onset  . Heart attack Mother   . Stroke Mother   . Aneurysm Father   . Colon cancer Neg Hx     ALLERGIES:  has No Known Allergies.  MEDICATIONS:  Current Outpatient Medications  Medication Sig Dispense Refill  . CALCIUM-MAGNESIUM PO Take 1 tablet by mouth 2 (two) times a week.     . Cholecalciferol 2000 units TABS Take 6,000 Units by mouth daily.    . clobetasol ointment (TEMOVATE) 0.05 % Use 2-3 x weekly (Patient taking differently: Apply 1 application topically 3 (three) times a week. ) 30 g 2  . Coenzyme Q10 (COQ10) 100 MG CAPS Take 100 mg by mouth daily.    . cyanocobalamin 500 MCG tablet Take 500 mcg by mouth daily.    . ferrous sulfate  325 (65 FE) MG tablet Take 1 tablet (325 mg total) by mouth 2 (two) times daily with a meal. 60 tablet 1  . ibuprofen (ADVIL,MOTRIN) 200 MG tablet Take 200 mg by mouth daily as needed for headache or moderate pain.    . milk thistle 175 MG tablet Take 175 mg by mouth daily.    . Multiple Vitamins-Minerals (ADULT GUMMY PO) Take 2 tablets by mouth daily.    . Multiple Vitamins-Minerals (HAIR SKIN AND NAILS FORMULA) TABS Take 2 tablets by mouth daily. 2500 mcg per serving    . naproxen sodium (ALEVE) 220 MG tablet Take 220 mg by mouth daily as needed (pain).    . Omega-3 Fatty Acids (FISH OIL) 1200 MG CAPS Take 1,200 mg by mouth daily.    . Selenium 100 MCG TABS Take 100 mcg by mouth daily.     Marland Kitchen  vitamin C (ASCORBIC ACID) 500 MG tablet Take 500 mg by mouth 2 (two) times a week.     . Wheat Dextrin (BENEFIBER) POWD Take 1 Scoop by mouth 2 (two) times a week.    Marland Kitchen HYDROcodone-acetaminophen (NORCO/VICODIN) 5-325 MG tablet Take 1-2 tablets by mouth every 4 (four) hours as needed for moderate pain. (Patient not taking: Reported on 07/14/2017) 20 tablet 0  . Na Sulfate-K Sulfate-Mg Sulf 17.5-3.13-1.6 GM/177ML SOLN Take 1 kit by mouth as directed. (Patient not taking: Reported on 07/14/2017) 1 Bottle 0  . nitrofurantoin, macrocrystal-monohydrate, (MACROBID) 100 MG capsule Take 1 capsule (100 mg total) by mouth 2 (two) times daily. (Patient not taking: Reported on 05/14/2017) 14 capsule 0   No current facility-administered medications for this visit.     REVIEW OF SYSTEMS:   Constitutional: Denies fevers, chills or abnormal night sweats Eyes: Denies blurriness of vision, double vision or watery eyes Ears, nose, mouth, throat, and face: Denies mucositis or sore throat Respiratory: Denies cough, dyspnea or wheezes Cardiovascular: Denies palpitation, chest discomfort or lower extremity swelling Gastrointestinal:  Denies nausea, heartburn or change in bowel habits Skin: Denies abnormal skin rashes Lymphatics:  Denies new lymphadenopathy or easy bruising Neurological:Denies numbness, tingling or new weaknesses Behavioral/Psych: Mood is stable, no new changes  All other systems were reviewed with the patient and are negative.  PHYSICAL EXAMINATION: ECOG PERFORMANCE STATUS: 1 - Symptomatic but completely ambulatory  Vitals:   07/14/17 0930  BP: 122/71  Pulse: 64  Resp: 16  Temp: 97.7 F (36.5 C)  SpO2: 100%   Filed Weights   07/14/17 0930  Weight: 147 lb 12.8 oz (67 kg)    GENERAL:alert, no distress and comfortable SKIN: skin color, texture, turgor are normal, no rashes or significant lesions EYES: normal, conjunctiva are pink and non-injected, sclera clear OROPHARYNX:no exudate, no erythema and lips, buccal mucosa, and tongue normal  NECK: supple, thyroid normal size, non-tender, without nodularity LYMPH:  no palpable lymphadenopathy in the cervical, axillary or inguinal LUNGS: clear to auscultation and percussion with normal breathing effort HEART: regular rate & rhythm and no murmurs and no lower extremity edema ABDOMEN:abdomen soft, non-tender and normal bowel sounds.  Scar well-healed from recent surgery.  No palpable hepatosplenomegaly. Musculoskeletal:no cyanosis of digits and no clubbing  PSYCH: alert & oriented x 3 with fluent speech NEURO: no focal motor/sensory deficits  LABORATORY DATA:  I have reviewed the data as listed Lab Results  Component Value Date   WBC 4.6 07/05/2017   HGB 11.6 (L) 07/05/2017   HCT 35.3 (L) 07/05/2017   MCV 88.9 07/05/2017   PLT 211 07/05/2017     Chemistry      Component Value Date/Time   NA 140 07/05/2017 0643   NA 142 02/25/2012 1030   K 3.7 07/05/2017 0643   K 4.1 02/25/2012 1030   CL 108 07/05/2017 0643   CL 107 02/25/2012 1030   CO2 22 07/05/2017 0643   CO2 26 02/25/2012 1030   BUN 12 07/05/2017 0643   BUN 13 02/25/2012 1030   CREATININE 0.81 07/05/2017 0643   CREATININE 0.79 02/25/2012 1030      Component Value Date/Time    CALCIUM 9.7 07/05/2017 0643   CALCIUM 9.8 02/25/2012 1030   ALKPHOS 74 06/14/2017 0915   ALKPHOS 86 02/25/2012 1030   AST 35 06/14/2017 0915   AST 58 02/25/2012 1030   ALT 28 06/14/2017 0915   BILITOT 0.3 06/14/2017 0915   BILITOT 0.5 02/25/2012 1030  RADIOGRAPHIC STUDIES: I have personally reviewed the radiological images as listed and agreed with the findings in the report. Dg Chest 2 View  Result Date: 07/05/2017 CLINICAL DATA:  Shortness of breath and dizziness EXAM: CHEST  2 VIEW COMPARISON:  12/24/2015 FINDINGS: Cardiac shadow is stable. Aortic calcifications are again seen. The lungs are well aerated bilaterally. No focal infiltrate or sizable effusion is seen. No acute bony abnormality is noted. IMPRESSION: No active cardiopulmonary disease. Electronically Signed   By: Inez Catalina M.D.   On: 07/05/2017 07:01    ASSESSMENT & PLAN:  1.Stage II (T2N0) Colon (hepatic flexure) adenocarcinoma: I have reviewed the pathology report with the patient in detail.  12 lymph nodes were negative for tumor involvement.  It is a low-grade tumor with no high risk features.  MMR was normal and MSI was low.  There is no role for adjuvant chemotherapy in this setting.  I have recommended doing a CT scan of the chest with contrast to complete metastatic workup.  She will see [place] back in 1 week to discuss the results.  Follow-up visits will be once every 3 months with a CEA level and CT scan of the abdomen and pelvis every 6 months for the first 2 years.  Colonoscopy will be repeated in one year after the last one on 05/19/2017.  We will also repeat a CEA level today, as her preoperative CEA was reported as 6.8.   2. Iron deficiency state: She has been on iron tablets twice daily for the last 1 month.  We will repeat a CBC, ferritin and iron panel today.  3.  I have also talked to her about a clinical trial assessing fainacial burden in patients with stage II colon cancer ([id number] CD).  She would like  to know further details about it.  All her questions were answered to her satisfaction.Total time spent is 45 minutes with more than 50% of the time spent face-to-face discussing her diagnosis, prognosis and further management.     Orders Placed This Encounter  Procedures  . CT Chest W Contrast    Standing Status:   Future    Standing Expiration Date:   07/14/2018    Order Specific Question:   If indicated for the ordered procedure, I authorize the administration of contrast media per Radiology protocol    Answer:   Yes    Order Specific Question:   Preferred imaging location?    Answer:   [facility]    Order Specific Question:   Radiology Contrast Protocol - do NOT remove file path    Answer:   [email or web address]    Order Specific Question:   Reason for Exam additional comments    Answer:   Newly diagnosed stage II colon cancer  . CBC  . Ferritin  . Iron and TIBC  . CEA        Derek Jack, MD 07/14/2017 10:24 AM

## 2017-07-15 LAB — CEA: CEA1: 6.3 ng/mL — AB (ref 0.0–4.7)

## 2017-07-16 ENCOUNTER — Other Ambulatory Visit: Payer: 59

## 2017-07-16 ENCOUNTER — Ambulatory Visit: Payer: 59 | Admitting: Hematology

## 2017-07-16 ENCOUNTER — Telehealth: Payer: Self-pay | Admitting: *Deleted

## 2017-07-16 ENCOUNTER — Encounter: Payer: Self-pay | Admitting: *Deleted

## 2017-07-16 DIAGNOSIS — Z87440 Personal history of urinary (tract) infections: Secondary | ICD-10-CM

## 2017-07-16 NOTE — Telephone Encounter (Signed)
Pt is going to drop urine off for UA C&S

## 2017-07-16 NOTE — Telephone Encounter (Signed)
Patient states she is experiencing some mild pelvic pain and knows she has had ecoli in her urine before. She is asking if she could bring a sample. Please advise.

## 2017-07-16 NOTE — Progress Notes (Signed)
Pt left urine to be sent for culture and u/a per JAG. Urine sent to lab. [place]

## 2017-07-17 LAB — URINALYSIS, ROUTINE W REFLEX MICROSCOPIC
Bilirubin, UA: NEGATIVE
Glucose, UA: NEGATIVE
Ketones, UA: NEGATIVE
Leukocytes, UA: NEGATIVE
NITRITE UA: NEGATIVE
PH UA: 6 (ref 5.0–7.5)
Protein, UA: NEGATIVE
RBC, UA: NEGATIVE
Specific Gravity, UA: <=1.005 — AB (ref 1.005–1.030)
Urobilinogen, Ur: 0.2 mg/dL (ref 0.2–1.0)

## 2017-07-18 LAB — URINE CULTURE: ORGANISM ID, BACTERIA: NO GROWTH

## 2017-07-19 ENCOUNTER — Telehealth: Payer: Self-pay | Admitting: Adult Health

## 2017-07-19 NOTE — Telephone Encounter (Signed)
Pt aware urine negative.

## 2017-07-27 ENCOUNTER — Ambulatory Visit (HOSPITAL_COMMUNITY)
Admission: RE | Admit: 2017-07-27 | Discharge: 2017-07-27 | Disposition: A | Discharge status: Home / Self Care | Payer: 59 | Source: Ambulatory Visit | Attending: Hematology | Admitting: Hematology

## 2017-07-27 DIAGNOSIS — C183 Malignant neoplasm of hepatic flexure: Secondary | ICD-10-CM | POA: Diagnosis not present

## 2017-07-27 MED ORDER — IOPAMIDOL (ISOVUE-300) INJECTION 61%
75.0000 mL | Freq: Once | INTRAVENOUS | Status: AC | PRN
  Administered 2017-07-27: 75 mL via INTRAVENOUS

## 2017-07-28 ENCOUNTER — Inpatient Hospital Stay (HOSPITAL_BASED_OUTPATIENT_CLINIC_OR_DEPARTMENT_OTHER): Payer: 59 | Admitting: Hematology

## 2017-07-28 ENCOUNTER — Encounter (HOSPITAL_COMMUNITY): Payer: Self-pay | Admitting: Hematology

## 2017-07-28 ENCOUNTER — Other Ambulatory Visit: Payer: Self-pay

## 2017-07-28 VITALS — BP 119/78 | HR 60 | Temp 98.6°F | Resp 16 | Wt 144.4 lb

## 2017-07-28 DIAGNOSIS — C183 Malignant neoplasm of hepatic flexure: Secondary | ICD-10-CM | POA: Diagnosis not present

## 2017-07-28 DIAGNOSIS — D509 Iron deficiency anemia, unspecified: Secondary | ICD-10-CM | POA: Diagnosis not present

## 2017-07-28 NOTE — Progress Notes (Signed)
Patient Care Team: Sharilyn Sites, MD as PCP - General (Family Medicine) Gala Romney Cristopher Estimable, MD (Gastroenterology)  DIAGNOSIS:  Encounter Diagnosis  Name Primary?  . Primary cancer of hepatic flexure of colon ([place]) Yes    CHIEF COMPLIANT: Follow-up of colon cancer.  INTERVAL HISTORY: Ariana Long is a very pleasant female who comes back for follow-up of colon cancer.  I have ordered CT scan of her chest at last visit.  We have also repeated a postoperative CEA level.  She complains of pain in her right posterior neck and occipital region for the last couple of weeks which is more consistent.  She had this pain even prior to surgery.  It is not associated with any nausea or vomiting.  Pain is dull and achy type.  No vision changes.  REVIEW OF SYSTEMS:   Constitutional: Denies fevers, chills or abnormal weight loss Eyes: Denies blurriness of vision Ears, nose, mouth, throat, and face: Denies mucositis or sore throat Respiratory: Denies cough, dyspnea or wheezes Cardiovascular: Denies palpitation, chest discomfort Gastrointestinal:  Denies nausea, heartburn or change in bowel habits Skin: Denies abnormal skin rashes Lymphatics: Denies new lymphadenopathy or easy bruising Neurological:Denies numbness, tingling or new weaknesses complains of pain in the occipital region. Behavioral/Psych: Mood is stable, no new changes  Extremities: No lower extremity edema   I have reviewed the past medical history, past surgical history, social history and family history with the patient and they are unchanged from previous note.  ALLERGIES:  has No Known Allergies.  MEDICATIONS:  Current Outpatient Medications  Medication Sig Dispense Refill  . CALCIUM-MAGNESIUM PO Take 1 tablet by mouth 2 (two) times a week.     . Cholecalciferol 2000 units TABS Take 6,000 Units by mouth daily.    . clobetasol ointment (TEMOVATE) 0.05 % Use 2-3 x weekly (Patient taking differently: Apply 1 application topically  3 (three) times a week. ) 30 g 2  . Coenzyme Q10 (COQ10) 100 MG CAPS Take 100 mg by mouth daily.    . cyanocobalamin 500 MCG tablet Take 500 mcg by mouth daily.    . ferrous sulfate 325 (65 FE) MG tablet Take 1 tablet (325 mg total) by mouth 2 (two) times daily with a meal. 60 tablet 1  . meclizine (ANTIVERT) 25 MG tablet 1 TABLET EVERY SIX HOURS AS NEEDED ORAL  0  . milk thistle 175 MG tablet Take 175 mg by mouth daily.    . Multiple Vitamins-Minerals (ADULT GUMMY PO) Take 2 tablets by mouth daily.    . Multiple Vitamins-Minerals (HAIR SKIN AND NAILS FORMULA) TABS Take 2 tablets by mouth daily. 2500 mcg per serving    . naproxen sodium (ALEVE) 220 MG tablet Take 220 mg by mouth daily as needed (pain).    . Omega-3 Fatty Acids (FISH OIL) 1200 MG CAPS Take 1,200 mg by mouth daily.    . Selenium 100 MCG TABS Take 100 mcg by mouth daily.     . vitamin C (ASCORBIC ACID) 500 MG tablet Take 500 mg by mouth 2 (two) times a week.     . Wheat Dextrin (BENEFIBER) POWD Take 1 Scoop by mouth 2 (two) times a week.     No current facility-administered medications for this visit.     PHYSICAL EXAMINATION: ECOG PERFORMANCE STATUS: 1 - Symptomatic but completely ambulatory  Vitals:   07/28/17 1525  BP: 119/78  Pulse: 60  Resp: 16  Temp: 98.6 F (37 C)  SpO2: 100%  Filed Weights   07/28/17 1525  Weight: 144 lb 6.4 oz (65.5 kg)     LABORATORY DATA:  I have reviewed the data as listed CMP Latest Ref Rng & Units 07/05/2017 06/17/2017 06/16/2017  Glucose 65 - 99 mg/dL 96 105(H) 126(H)  BUN 6 - 20 mg/dL 12 <5(L) <5(L)  Creatinine 0.44 - 1.00 mg/dL 0.81 0.87 0.88  Sodium 135 - 145 mmol/L 140 141 142  Potassium 3.5 - 5.1 mmol/L 3.7 3.7 4.1  Chloride 101 - 111 mmol/L 108 106 107  CO2 22 - 32 mmol/L '22 27 23  ' Calcium 8.9 - 10.3 mg/dL 9.7 8.8(L) 9.1  Total Protein 6.5 - 8.1 g/dL - - -  Total Bilirubin 0.3 - 1.2 mg/dL - - -  Alkaline Phos 38 - 126 U/L - - -  AST 15 - 41 U/L - - -  ALT 14 - 54 U/L -  - -   CEA was 6.3, preoperative CEA 6.8  Lab Results  Component Value Date   WBC 3.7 (L) 07/14/2017   HGB 11.6 (L) 07/14/2017   HCT 36.8 07/14/2017   MCV 89.3 07/14/2017   PLT 160 07/14/2017   NEUTROABS 2.1 06/14/2017    ASSESSMENT & PLAN:  Primary cancer of hepatic flexure of colon (HCC) 1.  Stage II (T2N0) hepatic flexure colon adenocarcinoma: -MMR normal/MSI low -Her CEA level remains to be elevated at 6.3 a month after surgery.  CT scan of the chest did not show any metastatic disease. - CEA can be elevated in benign conditions like liver disease, diverticulitis, gastritis, peptic ulcer disease, COPD.  I have recommended a PET/CT scan to evaluate for metastatic disease.  If the PET/CT scan is negative, one can assume that it is caused by her liver disease.  She has a history of hepatitis C and was treated for it.  I will see her back after the PET/CT scan.  2.  Iron deficiency state: She will continue iron tablet twice daily.  Her hemoglobin was 11.6 although her ferritin was still low at 27.      Derek Jack, MD 07/28/17

## 2017-07-28 NOTE — Assessment & Plan Note (Signed)
1.  Stage II (T2N0) hepatic flexure colon adenocarcinoma: -MMR normal/MSI low -Her CEA level remains to be elevated at 6.3 a month after surgery.  CT scan of the chest did not show any metastatic disease. - CEA can be elevated in benign conditions like liver disease, diverticulitis, gastritis, peptic ulcer disease, COPD.  I have recommended a PET/CT scan to evaluate for metastatic disease.  If the PET/CT scan is negative, one can assume that it is caused by her liver disease.  She has a history of hepatitis C and was treated for it.  I will see her back after the PET/CT scan.  2.  Iron deficiency state: She will continue iron tablet twice daily.  Her hemoglobin was 11.6 although her ferritin was still low at 27.

## 2017-08-16 ENCOUNTER — Ambulatory Visit (HOSPITAL_COMMUNITY)
Admission: RE | Admit: 2017-08-16 | Discharge: 2017-08-16 | Disposition: A | Discharge status: Home / Self Care | Payer: 59 | Source: Ambulatory Visit | Attending: Hematology | Admitting: Hematology

## 2017-08-16 DIAGNOSIS — I700 Atherosclerosis of aorta: Secondary | ICD-10-CM | POA: Insufficient documentation

## 2017-08-16 DIAGNOSIS — N200 Calculus of kidney: Secondary | ICD-10-CM | POA: Diagnosis not present

## 2017-08-16 DIAGNOSIS — C183 Malignant neoplasm of hepatic flexure: Secondary | ICD-10-CM | POA: Diagnosis not present

## 2017-08-16 MED ORDER — FLUDEOXYGLUCOSE F - 18 (FDG) INJECTION
10.0000 | Freq: Once | INTRAVENOUS | Status: AC
  Administered 2017-08-16: 10.1 via INTRAVENOUS

## 2017-08-18 ENCOUNTER — Inpatient Hospital Stay (HOSPITAL_COMMUNITY): Payer: 59 | Attending: Hematology | Admitting: Hematology

## 2017-08-18 ENCOUNTER — Encounter (HOSPITAL_COMMUNITY): Payer: Self-pay | Admitting: Hematology

## 2017-08-18 VITALS — BP 126/76 | HR 82 | Temp 98.3°F | Resp 16 | Wt 142.4 lb

## 2017-08-18 DIAGNOSIS — E611 Iron deficiency: Secondary | ICD-10-CM | POA: Diagnosis not present

## 2017-08-18 DIAGNOSIS — B1920 Unspecified viral hepatitis C without hepatic coma: Secondary | ICD-10-CM

## 2017-08-18 DIAGNOSIS — C183 Malignant neoplasm of hepatic flexure: Secondary | ICD-10-CM | POA: Insufficient documentation

## 2017-08-18 NOTE — Assessment & Plan Note (Signed)
1.  Stage II (T2N0) hepatic flexure colon adenocarcinoma: -MMR normal/MSI low -Her CEA level remains to be elevated at 6.3 a month after surgery.  CT scan of the chest did not show any metastatic disease. -I have reviewed the results of the PET CT scan which did not show any evidence of recurrence or metastatic disease.  Her CEA elevation is likely from her liver disease.  She had a history of hepatitis C and was treated for it.  I have recommended surveillance visits once every 3 months with blood work including CEA level.  We plan to repeat CT scan of the abdomen and pelvis once every 6 months for the first 2 years followed by yearly scans up to year 5.  She will have a colonoscopy 1 year after the last one.  2.  Iron deficiency state: She will continue iron tablet twice daily.  Her last ferritin was low at 27.  She takes Benefiber with iron tablets.  She is tolerating it well.  She will continue with until next visit.  I plan to repeat ferritin and iron panel along with CBC next visit.

## 2017-08-18 NOTE — Patient Instructions (Signed)
[facility]  Discharge Instructions:  You were seen by Dr. Katragadda.  _______________________________________________________________  Thank you for choosing [facility] to provide your oncology and hematology care.  To afford each patient quality time with our providers, please arrive at least 15 minutes before your scheduled appointment.  You need to re-schedule your appointment if you arrive 10 or more minutes late.  We strive to give you quality time with our providers, and arriving late affects you and other patients whose appointments are after yours.  Also, if you no show three or more times for appointments you may be dismissed from the clinic.  Again, thank you for choosing [facility]. Our hope is that these requests will allow you access to exceptional care and in a timely manner. _______________________________________________________________  If you have questions after your visit, please contact our office at [phone] between the hours of 8:30 a.m. and 5:00 p.m. Voicemails left after 4:30 p.m. will not be returned until the following business day. _______________________________________________________________  For prescription refill requests, have your pharmacy contact our office. _______________________________________________________________  Recommendations made by the consultant and any test results will be sent to your referring physician. _______________________________________________________________ 

## 2017-08-18 NOTE — Progress Notes (Signed)
[place], [place] [id number]   CLINIC:  Medical Oncology/Hematology  PCP:  Sharilyn Sites, [place] [address] [phone]   REASON FOR VISIT:  Follow-up for stage II colon cancer.  CURRENT THERAPY: Observation.  BRIEF ONCOLOGIC HISTORY:    Primary cancer of hepatic flexure of colon ([place])   06/15/2017 Initial Diagnosis    Primary cancer of hepatic flexure of colon ([place])      06/15/2017 Cancer Staging    Staging form: Colon and Rectum - Neuroendocine Tumors, AJCC 8th Edition - Clinical stage from 06/15/2017: Stage IIA (cT2, cN0, cM0) - Signed by Derek Jack, MD on 08/18/2017        CANCER STAGING: Cancer Staging Primary cancer of hepatic flexure of colon ([place]) Staging form: Colon and Rectum - Neuroendocine Tumors, AJCC 8th Edition - Clinical stage from 06/15/2017: Stage IIA (cT2, cN0, cM0) - Signed by Derek Jack, MD on 08/18/2017    INTERVAL HISTORY:  Ms. Sorber 62 y.o. female returns for follow-up of PET scan results.  We have ordered PET/CT scan because of her elevated CEA level post surgery.  She denies any new onset pains.  She is tolerating iron tablet twice daily.  She takes Benefiber for constipation.  She denies any abdominal pains.  Denies any change in bowel habits.  She is planning to go out to [place] on a trip in first week of May.  REVIEW OF SYSTEMS:  Review of Systems  Constitutional: Negative.   HENT:  Negative.   Respiratory: Negative.   Cardiovascular: Negative.   Gastrointestinal: Positive for constipation.  Genitourinary: Negative.    Musculoskeletal: Negative.   Skin: Negative.   Neurological: Negative.   Hematological: Negative.   Psychiatric/Behavioral: Negative.      PAST MEDICAL/SURGICAL HISTORY:  Past Medical History:  Diagnosis Date  . Cirrhosis of liver ([place]) 01/26/2011   FIBROSIS on liver biopsy in 2007, U/S  03/31/2013= mild diffuse hepatic steatosis  and /or hepatocellular disease without focal hepatic parenchymal abnormality. per Dr. Patsy [place] (2012) pt is immune to hep A and Hep B. per 12/06/13 note from Liver clinic-pt had MRI which showed cirrhosis 2015.  . Diverticulitis of colon   . Elevated AFP 01/26/2011  . Gall stones   . Hematuria 12/12/2014  . Hemorrhoid 12/01/2012  . Hemorrhoids   . Hepatitis C 01/26/2011   2005 non responder, genotype 1, Gr 1-2, Stage 3 . Treated with Harvoni at the Liver clinic, responder  . Hidradenitis   . History of hiatal hernia   . Hx: UTI (urinary tract infection)   . Kidney stones 10/2011  . Osteoporosis   . Primary cancer of hepatic flexure of colon ([place]) 06/15/2017  . Serrated adenoma of colon 11/2011  . Splenomegaly 01/26/2011  . Thrombocytopenia ([place]) 01/26/2011  . Tubular adenoma   . Vulval lesion 12/14/2013   Has kissing lesions thin and puffy and paler i color about 5-6- mm in diameter.Dr Glo Herring in to co examine will try temovate and recheck in 3 months   Past Surgical History:  Procedure Laterality Date  . AXILLARY SURGERY Bilateral 1979   "glands removed"  . BREAST BIOPSY Right 2011  . BREAST EXCISIONAL BIOPSY Bilateral 25 years ago   Benign, fatty tumors  . CESAREAN SECTION     x 2  . COLECTOMY  06/15/2017   LAPAROSCOPIC ASSISTED RIGHT COLECTOMY, ERAS PATHWAY/notes 06/15/2017  . COLONOSCOPY  07/31/2005   R[place]k-Normal rectum/Normal colonoscopy/Repeat screening colonoscopy ten years  .  COLONOSCOPY  11/16/2011   RMR: Friable anorectum-likely source of hematochezia/ LARGE 1.2x2.5CM SERRATED ADENOMA resected from ascending colon 7CM DISTAL TO ICV, hyperplastic polyp removed   . COLONOSCOPY N/A 07/18/2012   Dr. Gala Romney- normal appearing rectal mucosa. no residual polpy tissue seen at tatoo location. remainder of colonic mucosa appeared entirely normal. bx = tubular adenoma  . COLONOSCOPY N/A 08/29/2015   Dr. Gala Romney: single 12 mm polyp in ascending colon, 4 mm polyp ascending. Sessile serrated adenoma.  3 year surveillance  . COLONOSCOPY N/A 05/19/2017   Procedure: COLONOSCOPY;  Surgeon: Daneil Dolin, MD;  [facility];  Service: Endoscopy;  Laterality: N/A;  8:30am  . ESOPHAGEAL BANDING N/A 08/29/2015   Procedure: ESOPHAGEAL BANDING;  Surgeon: Daneil Dolin, MD;  [facility];  Service: Endoscopy;  Laterality: N/A;  . ESOPHAGOGASTRODUODENOSCOPY N/A 08/29/2015   Dr. Gala Romney: non-critical Schatzki's ring, somewhat prominent small submucosal vessels without obvious varices. hyperplastic gastric polyp. 2 year surveillance  . HEMORRHOID BANDING    . LAPAROSCOPIC RIGHT COLECTOMY N/A 06/15/2017   Procedure: LAPAROSCOPIC ASSISTED RIGHT COLECTOMY, ERAS PATHWAY;  Surgeon: Fanny Skates, MD;  Location: [facility];  Service: General;  Laterality: N/A;  . POLYPECTOMY  05/19/2017   Procedure: POLYPECTOMY;  Surgeon: Daneil Dolin, MD;  [facility];  Service: Endoscopy;;  . SKIN LESION EXCISION       SOCIAL HISTORY:  Social History   Socioeconomic History  . Marital status: Married    Spouse name: Not on file  . Number of children: 2  . Years of education: Not on file  . Highest education level: Not on file  Occupational History  . Occupation: Occupational psychologist: [employer]  . Financial resource strain: Not hard at all  . Food insecurity:    Worry: Never true    Inability: Never true  . Transportation needs:    Medical: No    Non-medical: No  Tobacco Use  . Smoking status: Never Smoker  . Smokeless tobacco: Never Used  Substance and Sexual Activity  . Alcohol use: No  . Drug use: No  . Sexual activity: Yes    Birth control/protection: Post-menopausal  Lifestyle  . Physical activity:    Days per week: 3 days    Minutes per session: 30 min  . Stress: Not at all  Relationships  . Social connections:    Talks on phone: More than three times a week    Gets together: More than three times a week    Attends religious service: More  than 4 times per year    Active member of club or organization: Yes    Attends meetings of clubs or organizations: More than 4 times per year    Relationship status: Married  . Intimate partner violence:    Fear of current or ex partner: No    Emotionally abused: No    Physically abused: No    Forced sexual activity: No  Other Topics Concern  . Not on file  Social History Narrative  . Not on file    FAMILY HISTORY:  Family History  Problem Relation Age of Onset  . Heart attack Mother   . Stroke Mother   . Aneurysm Father   . Colon cancer Neg Hx     CURRENT MEDICATIONS:  Outpatient Encounter Medications as of 08/18/2017  Medication Sig  . CALCIUM-MAGNESIUM PO Take 1 tablet by mouth 2 (two) times a week.   Marland Kitchen  Cholecalciferol 2000 units TABS Take 6,000 Units by mouth daily.  . clobetasol ointment (TEMOVATE) 0.05 % Use 2-3 x weekly (Patient taking differently: Apply 1 application topically 3 (three) times a week. )  . Coenzyme Q10 (COQ10) 100 MG CAPS Take 100 mg by mouth daily.  . cyanocobalamin 500 MCG tablet Take 500 mcg by mouth daily.  . ferrous sulfate 325 (65 FE) MG tablet Take 1 tablet (325 mg total) by mouth 2 (two) times daily with a meal.  . meclizine (ANTIVERT) 25 MG tablet 1 TABLET EVERY SIX HOURS AS NEEDED ORAL  . milk thistle 175 MG tablet Take 175 mg by mouth daily.  . Multiple Vitamins-Minerals (ADULT GUMMY PO) Take 2 tablets by mouth daily.  . Multiple Vitamins-Minerals (HAIR SKIN AND NAILS FORMULA) TABS Take 2 tablets by mouth daily. 2500 mcg per serving  . naproxen sodium (ALEVE) 220 MG tablet Take 220 mg by mouth daily as needed (pain).  . Omega-3 Fatty Acids (FISH OIL) 1200 MG CAPS Take 1,200 mg by mouth daily.  . Selenium 100 MCG TABS Take 100 mcg by mouth daily.   . vitamin C (ASCORBIC ACID) 500 MG tablet Take 500 mg by mouth 2 (two) times a week.   . Wheat Dextrin (BENEFIBER) POWD Take 1 Scoop by mouth 2 (two) times a week.   No facility-administered  encounter medications on file as of 08/18/2017.     ALLERGIES:  No Known Allergies   PHYSICAL EXAM:  ECOG Performance status: 0  Vitals:   08/18/17 1536  BP: 126/76  Pulse: 82  Resp: 16  Temp: 98.3 F (36.8 C)  SpO2: 99%   Filed Weights   08/18/17 1536  Weight: 142 lb 6.4 oz (64.6 kg)       LABORATORY DATA:  I have reviewed the labs as listed.  CBC    Component Value Date/Time   WBC 3.7 (L) 07/14/2017 1023   RBC 4.12 07/14/2017 1023   HGB 11.6 (L) 07/14/2017 1023   HCT 36.8 07/14/2017 1023   HCT 42 02/25/2012 1033   PLT 160 07/14/2017 1023   MCV 89.3 07/14/2017 1023   MCH 28.2 07/14/2017 1023   MCHC 31.5 07/14/2017 1023   RDW 15.3 07/14/2017 1023   LYMPHSABS 1.5 06/14/2017 0915   MONOABS 0.3 06/14/2017 0915   EOSABS 0.1 06/14/2017 0915   BASOSABS 0.0 06/14/2017 0915   CMP Latest Ref Rng & Units 07/05/2017 06/17/2017 06/16/2017  Glucose 65 - 99 mg/dL 96 105(H) 126(H)  BUN 6 - 20 mg/dL 12 <5(L) <5(L)  Creatinine 0.44 - 1.00 mg/dL 0.81 0.87 0.88  Sodium 135 - 145 mmol/L 140 141 142  Potassium 3.5 - 5.1 mmol/L 3.7 3.7 4.1  Chloride 101 - 111 mmol/L 108 106 107  CO2 22 - 32 mmol/L _0 Calcium 8.9 - 10.3 mg/dL 9.7 8.8(L) 9.1  Total Protein 6.5 - 8.1 g/dL - - -  Total Bilirubin 0.3 - 1.2 mg/dL - - -  Alkaline Phos 38 - 126 U/L - - -  AST 15 - 41 U/L - - -  ALT 14 - 54 U/L - - -       DIAGNOSTIC IMAGING:  I have independently reviewed her PET/CT scan which did not show any evidence of metastatic disease.  I agree with radiology report.    ASSESSMENT & PLAN:   Primary cancer of hepatic flexure of colon ([place]) 1.  Stage II (T2N0) hepatic flexure colon adenocarcinoma: -MMR normal/MSI low -Her CEA level  remains to be elevated at 6.3 a month after surgery.  CT scan of the chest did not show any metastatic disease. -I have reviewed the results of the PET CT scan which did not show any evidence of recurrence or metastatic disease.  Her CEA elevation is  likely from her liver disease.  She had a history of hepatitis C and was treated for it.  I have recommended surveillance visits once every 3 months with blood work including CEA level.  We plan to repeat CT scan of the abdomen and pelvis once every 6 months for the first 2 years followed by yearly scans up to year 5.  She will have a colonoscopy 1 year after the last one.  2.  Iron deficiency state: She will continue iron tablet twice daily.  Her last ferritin was low at 27.  She takes Benefiber with iron tablets.  She is tolerating it well.  She will continue with until next visit.  I plan to repeat ferritin and iron panel along with CBC next visit.      Orders placed this encounter:  Orders Placed This Encounter  Procedures  . CBC  . Comprehensive metabolic panel  . CEA  . Ferritin  . Iron and TIBC      Derek Jack, MD [place] [phone]

## 2017-09-15 ENCOUNTER — Ambulatory Visit: Payer: 59 | Admitting: Adult Health

## 2017-09-28 ENCOUNTER — Encounter (INDEPENDENT_AMBULATORY_CARE_PROVIDER_SITE_OTHER): Payer: Self-pay

## 2017-09-28 ENCOUNTER — Encounter: Payer: Self-pay | Admitting: Adult Health

## 2017-09-28 ENCOUNTER — Ambulatory Visit: Payer: 59 | Admitting: Adult Health

## 2017-09-28 VITALS — BP 114/60 | HR 74 | Ht 65.0 in | Wt 144.0 lb

## 2017-09-28 DIAGNOSIS — Z87440 Personal history of urinary (tract) infections: Secondary | ICD-10-CM | POA: Diagnosis not present

## 2017-09-28 DIAGNOSIS — L280 Lichen simplex chronicus: Secondary | ICD-10-CM | POA: Diagnosis not present

## 2017-09-28 DIAGNOSIS — N950 Postmenopausal bleeding: Secondary | ICD-10-CM | POA: Diagnosis not present

## 2017-09-28 DIAGNOSIS — N939 Abnormal uterine and vaginal bleeding, unspecified: Secondary | ICD-10-CM | POA: Insufficient documentation

## 2017-09-28 MED ORDER — CLOBETASOL PROPIONATE 0.05 % EX OINT: TOPICAL_OINTMENT | CUTANEOUS | 2 refills | Status: DC

## 2017-09-28 NOTE — Progress Notes (Signed)
  Subjective:     Patient ID: [name], female   DOB: [birth year], 62 y.o.   MRN: [id number]  HPI Ercell is a 62 year old black female in complaining of vaginal bleeding Friday, none now.  Review of Systems +vaginal bleeding Friday Stomach has heavy feeling at times in bottom   Reviewed past medical,surgical, social and family history. Reviewed medications and allergies.     Objective:   Physical Exam BP 114/60 (BP Location: Right Arm, Patient Position: Sitting, Cuff Size: Normal)   Pulse 74   Ht 5\' 5"  (1.651 m)   Wt 144 lb (65.3 kg)   BMI 23.96 kg/m urine dipstick negtive.    Skin warm and dry.Pelvic: external genitalia is normal in appearance,but has LSA, and skin is dry, vagina: pale with loss of moisture and rugae,urethra has small caruncle. cervix:smooth, uterus: normal size, shape and contour, non tender, no masses felt, adnexa: no masses or tenderness noted. Bladder is non tender and no masses felt.   Assessment:     1. PMB (postmenopausal bleeding)   2. Lichenification and lichen simplex chronicus   3. History of recurrent UTI (urinary tract infection)       Plan:     Meds ordered this encounter  Medications  . clobetasol ointment (TEMOVATE) 0.05 %    Sig: Use 2-3 x weekly    Dispense:  30 g    Refill:  2    Order Specific Question:   Supervising Provider    Answer:   Tania Ade H [[id number]]  Return in 1 week for GYN [place] UA C&S sent

## 2017-09-29 LAB — URINALYSIS, ROUTINE W REFLEX MICROSCOPIC
Bilirubin, UA: NEGATIVE
GLUCOSE, UA: NEGATIVE
KETONES UA: NEGATIVE
Leukocytes, UA: NEGATIVE
NITRITE UA: NEGATIVE
Protein, UA: NEGATIVE
RBC, UA: NEGATIVE
SPEC GRAV UA: 1.021 (ref 1.005–1.030)
Urobilinogen, Ur: 0.2 mg/dL (ref 0.2–1.0)
pH, UA: 5 (ref 5.0–7.5)

## 2017-09-30 LAB — URINE CULTURE

## 2017-10-01 ENCOUNTER — Telehealth: Payer: Self-pay | Admitting: Obstetrics & Gynecology

## 2017-10-01 NOTE — Telephone Encounter (Signed)
Spoke with pt letting her know her urine culture didn't show anything that needs any meds/treatment. Pt states it has showed e coli in the past. I advised no e coli showed up this time. Pt voiced understanding. [place]

## 2017-10-05 ENCOUNTER — Telehealth: Payer: Self-pay | Admitting: Adult Health

## 2017-10-05 ENCOUNTER — Ambulatory Visit (INDEPENDENT_AMBULATORY_CARE_PROVIDER_SITE_OTHER): Payer: 59

## 2017-10-05 DIAGNOSIS — N950 Postmenopausal bleeding: Secondary | ICD-10-CM | POA: Diagnosis not present

## 2017-10-05 NOTE — Progress Notes (Signed)
PELVIC UT TA/TV:heterogeneous anteflexed uterus,wnl,normal ovaries bilat,EEC 1.7 mm,no free fluid,pelvic pain during ultrasound

## 2017-10-05 NOTE — Telephone Encounter (Signed)
Pt aware that uterus and ovaries normal and EEC 1.7 mm, do not have to do any further testing

## 2017-10-06 ENCOUNTER — Ambulatory Visit: Payer: 59 | Admitting: Gastroenterology

## 2017-11-08 ENCOUNTER — Inpatient Hospital Stay (HOSPITAL_COMMUNITY): Payer: 59 | Attending: Hematology

## 2017-11-08 DIAGNOSIS — B1920 Unspecified viral hepatitis C without hepatic coma: Secondary | ICD-10-CM | POA: Diagnosis not present

## 2017-11-08 DIAGNOSIS — M818 Other osteoporosis without current pathological fracture: Secondary | ICD-10-CM | POA: Diagnosis not present

## 2017-11-08 DIAGNOSIS — E611 Iron deficiency: Secondary | ICD-10-CM | POA: Diagnosis not present

## 2017-11-08 DIAGNOSIS — C183 Malignant neoplasm of hepatic flexure: Secondary | ICD-10-CM

## 2017-11-08 LAB — COMPREHENSIVE METABOLIC PANEL
ALK PHOS: 74 U/L (ref 38–126)
ALT: 25 U/L (ref 0–44)
AST: 28 U/L (ref 15–41)
Albumin: 3.9 g/dL (ref 3.5–5.0)
Anion gap: 9 (ref 5–15)
BILIRUBIN TOTAL: 0.4 mg/dL (ref 0.3–1.2)
BUN: 15 mg/dL (ref 8–23)
CO2: 23 mmol/L (ref 22–32)
CREATININE: 0.68 mg/dL (ref 0.44–1.00)
Calcium: 9.1 mg/dL (ref 8.9–10.3)
Chloride: 108 mmol/L (ref 98–111)
GFR calc Af Amer: >60 mL/min (ref >60)
Glucose, Bld: 106 mg/dL — ABNORMAL HIGH (ref 70–99)
Potassium: 3.7 mmol/L (ref 3.5–5.1)
Sodium: 140 mmol/L (ref 135–145)
TOTAL PROTEIN: 7.7 g/dL (ref 6.5–8.1)

## 2017-11-08 LAB — CBC
HEMATOCRIT: 39.1 % (ref 36.0–46.0)
Hemoglobin: 13 g/dL (ref 12.0–15.0)
MCH: 30.1 pg (ref 26.0–34.0)
MCHC: 33.2 g/dL (ref 30.0–36.0)
MCV: 90.5 fL (ref 78.0–100.0)
Platelets: 177 10*3/uL (ref 150–400)
RBC: 4.32 MIL/uL (ref 3.87–5.11)
RDW: 14.2 % (ref 11.5–15.5)
WBC: 4.8 10*3/uL (ref 4.0–10.5)

## 2017-11-08 LAB — IRON AND TIBC
Iron: 55 ug/dL (ref 28–170)
Saturation Ratios: 16 % (ref 10.4–31.8)
TIBC: 353 ug/dL (ref 250–450)
UIBC: 298 ug/dL

## 2017-11-08 LAB — FERRITIN: FERRITIN: 42 ng/mL (ref 11–307)

## 2017-11-09 LAB — CEA: CEA: 6.8 ng/mL — ABNORMAL HIGH (ref 0.0–4.7)

## 2017-11-15 ENCOUNTER — Inpatient Hospital Stay (HOSPITAL_COMMUNITY): Payer: 59 | Admitting: Hematology

## 2017-11-25 ENCOUNTER — Inpatient Hospital Stay (HOSPITAL_BASED_OUTPATIENT_CLINIC_OR_DEPARTMENT_OTHER): Payer: 59 | Admitting: Hematology

## 2017-11-25 ENCOUNTER — Other Ambulatory Visit: Payer: Self-pay

## 2017-11-25 ENCOUNTER — Encounter (HOSPITAL_COMMUNITY): Payer: Self-pay | Admitting: Hematology

## 2017-11-25 VITALS — BP 135/67 | HR 73 | Temp 98.6°F | Resp 18 | Wt 143.4 lb

## 2017-11-25 DIAGNOSIS — M818 Other osteoporosis without current pathological fracture: Secondary | ICD-10-CM | POA: Diagnosis not present

## 2017-11-25 DIAGNOSIS — C183 Malignant neoplasm of hepatic flexure: Secondary | ICD-10-CM

## 2017-11-25 DIAGNOSIS — E611 Iron deficiency: Secondary | ICD-10-CM | POA: Diagnosis not present

## 2017-11-25 DIAGNOSIS — B1920 Unspecified viral hepatitis C without hepatic coma: Secondary | ICD-10-CM

## 2017-11-25 NOTE — Assessment & Plan Note (Signed)
1.  Stage II (T2N0) hepatic flexure colon adenocarcinoma: -MMR normal/MSI low -Her CEA level remains to be elevated at 6.3 a month after surgery.  CT scan of the chest did not show any metastatic disease. -PET CT scan on 08/16/2017 did not show any evidence of recurrence or metastatic disease.  Her CEA elevation is likely from her liver disease.  She had a history of hepatitis C and was treated for it. -We have reviewed results of her blood work today.  Hemoglobin normalized.  CEA is continuing to be elevated at 6.8 but stable. -We will continue surveillance visits every 3 months with blood work, CEA level.  We plan to repeat CT scan of the abdomen and pelvis once every 6 months for the first 2 years followed by yearly scans up to year 5.  She will have a colonoscopy 1 year after the last one.  2.  Iron deficiency state: She stopped taking iron pills.  Ferritin was within normal limits.  Hemoglobin has normalized.  She is taking Benefiber since the surgery which is helping.

## 2017-11-25 NOTE — Patient Instructions (Signed)
[facility] at [facility] Discharge Instructions  Today you saw Dr. K.   Thank you for choosing [facility] at [facility] to provide your oncology and hematology care.  To afford each patient quality time with our provider, please arrive at least 15 minutes before your scheduled appointment time.   If you have a lab appointment with the Cancer Center please come in thru the  Main Entrance and check in at the main information desk  You need to re-schedule your appointment should you arrive 10 or more minutes late.  We strive to give you quality time with our providers, and arriving late affects you and other patients whose appointments are after yours.  Also, if you no show three or more times for appointments you may be dismissed from the clinic at the providers discretion.     Again, thank you for choosing [facility].  Our hope is that these requests will decrease the amount of time that you wait before being seen by our physicians.       _____________________________________________________________  Should you have questions after your visit to [facility], please contact our office at [phone] between the hours of 8:00 a.m. and 4:30 p.m.  Voicemails left after 4:00 p.m. will not be returned until the following business day.  For prescription refill requests, have your pharmacy contact our office and allow 72 hours.    Cancer Center Support Programs:   > Cancer Support Group  2nd Tuesday of the month 1pm-2pm, Journey Room   

## 2017-11-25 NOTE — Progress Notes (Signed)
Ariana Long, [place] [address]   CLINIC:  Medical Oncology/Hematology  PCP:  Sharilyn Sites, [address] [place] [address] [phone]   REASON FOR VISIT:  Follow-up for stage II colon cancer  CURRENT THERAPY: observation  BRIEF ONCOLOGIC HISTORY:    Primary cancer of hepatic flexure of colon ([place])   06/15/2017 Initial Diagnosis    Primary cancer of hepatic flexure of colon ([place])      06/15/2017 Cancer Staging    Staging form: Colon and Rectum - Neuroendocine Tumors, AJCC 8th Edition - Clinical stage from 06/15/2017: Stage IIA (cT2, cN0, cM0) - Signed by Derek Jack, MD on 08/18/2017        CANCER STAGING: Cancer Staging Primary cancer of hepatic flexure of colon ([place]) Staging form: Colon and Rectum - Neuroendocine Tumors, AJCC 8th Edition - Clinical stage from 06/15/2017: Stage IIA (cT2, cN0, cM0) - Signed by Derek Jack, MD on 08/18/2017    INTERVAL HISTORY:  Ariana Long 62 y.o. female returns for routine follow-up for stage II colon cancer. Patient is here today with no complaints. She is still taking the Megace to help move her bowels. She was concerned about needing it long term. If she doesn't take it she gets constipations. Patient denies any new pains. Denies any nausea, vomiting, diarrhea. Denies any bleeding or blood in her stool. Patient states her energy level and appetite remains good at 100%.     REVIEW OF SYSTEMS:  Review of Systems  All other systems reviewed and are negative.    PAST MEDICAL/SURGICAL HISTORY:  Past Medical History:  Diagnosis Date  . Cirrhosis of liver ([place]) 01/26/2011   FIBROSIS on liver biopsy in 2007, U/S  03/31/2013= mild diffuse hepatic steatosis and /or hepatocellular disease without focal hepatic parenchymal abnormality. per Dr. Patsy Baltimore (2012) pt is immune to hep A and Hep B. per 12/06/13 note from Liver clinic-pt had MRI which showed cirrhosis 2015.  .  Diverticulitis of colon   . Elevated AFP 01/26/2011  . Gall stones   . Hematuria 12/12/2014  . Hemorrhoid 12/01/2012  . Hemorrhoids   . Hepatitis C 01/26/2011   2005 non responder, genotype 1, Gr 1-2, Stage 3 . Treated with Harvoni at the Liver clinic, responder  . Hidradenitis   . History of hiatal hernia   . Hx: UTI (urinary tract infection)   . Kidney stones 10/2011  . Osteoporosis   . Primary cancer of hepatic flexure of colon ([facility]) 06/15/2017  . Serrated adenoma of colon 11/2011  . Splenomegaly 01/26/2011  . Thrombocytopenia ([place]) 01/26/2011  . Tubular adenoma   . Vulval lesion 12/14/2013   Has kissing lesions thin and puffy and paler i color about 5-6- mm in diameter.Dr Glo Herring in to co examine will try temovate and recheck in 3 months   Past Surgical History:  Procedure Laterality Date  . AXILLARY SURGERY Bilateral 1979   "glands removed"  . BREAST BIOPSY Right 2011  . BREAST EXCISIONAL BIOPSY Bilateral 25 years ago   Benign, fatty tumors  . CESAREAN SECTION     x 2  . COLECTOMY  06/15/2017   LAPAROSCOPIC ASSISTED RIGHT COLECTOMY, ERAS PATHWAY/notes 06/15/2017  . COLONOSCOPY  07/31/2005   Rourk-Normal rectum/Normal colonoscopy/Repeat screening colonoscopy ten years  . COLONOSCOPY  11/16/2011   RMR: Friable anorectum-likely source of hematochezia/ LARGE 1.2x2.5CM SERRATED ADENOMA resected from ascending colon 7CM DISTAL TO ICV, hyperplastic polyp removed   . COLONOSCOPY N/A 07/18/2012   Dr.  Rourk- normal appearing rectal mucosa. no residual polpy tissue seen at tatoo location. remainder of colonic mucosa appeared entirely normal. bx = tubular adenoma  . COLONOSCOPY N/A 08/29/2015   Dr. Gala Romney: single 12 mm polyp in ascending colon, 4 mm polyp ascending. Sessile serrated adenoma. 3 year surveillance  . COLONOSCOPY N/A 05/19/2017   Procedure: COLONOSCOPY;  Surgeon: Daneil Dolin, MD;  [facility];  Service: Endoscopy;  Laterality: N/A;  8:30am  . ESOPHAGEAL BANDING N/A  08/29/2015   Procedure: ESOPHAGEAL BANDING;  Surgeon: Daneil Dolin, MD;  [facility];  Service: Endoscopy;  Laterality: N/A;  . ESOPHAGOGASTRODUODENOSCOPY N/A 08/29/2015   Dr. Gala Romney: non-critical Schatzki's ring, somewhat prominent small submucosal vessels without obvious varices. hyperplastic gastric polyp. 2 year surveillance  . HEMORRHOID BANDING    . LAPAROSCOPIC RIGHT COLECTOMY N/A 06/15/2017   Procedure: LAPAROSCOPIC ASSISTED RIGHT COLECTOMY, ERAS PATHWAY;  Surgeon: Fanny Skates, MD;  Location: [facility];  Service: General;  Laterality: N/A;  . POLYPECTOMY  05/19/2017   Procedure: POLYPECTOMY;  Surgeon: Daneil Dolin, MD;  [facility];  Service: Endoscopy;;  . SKIN LESION EXCISION       SOCIAL HISTORY:  Social History   Socioeconomic History  . Marital status: Married    Spouse name: Not on file  . Number of children: 2  . Years of education: Not on file  . Highest education level: Not on file  Occupational History  . Occupation: Occupational psychologist: [employer]  . Financial resource strain: Not hard at all  . Food insecurity:    Worry: Never true    Inability: Never true  . Transportation needs:    Medical: No    Non-medical: No  Tobacco Use  . Smoking status: Never Smoker  . Smokeless tobacco: Never Used  Substance and Sexual Activity  . Alcohol use: No  . Drug use: No  . Sexual activity: Yes    Birth control/protection: Post-menopausal  Lifestyle  . Physical activity:    Days per week: 3 days    Minutes per session: 30 min  . Stress: Not at all  Relationships  . Social connections:    Talks on phone: More than three times a week    Gets together: More than three times a week    Attends religious service: More than 4 times per year    Active member of club or organization: Yes    Attends meetings of clubs or organizations: More than 4 times per year    Relationship status: Married  . Intimate partner violence:     Fear of current or ex partner: No    Emotionally abused: No    Physically abused: No    Forced sexual activity: No  Other Topics Concern  . Not on file  Social History Narrative  . Not on file    FAMILY HISTORY:  Family History  Problem Relation Age of Onset  . Heart attack Mother   . Stroke Mother   . Aneurysm Father   . Colon cancer Neg Hx     CURRENT MEDICATIONS:  Outpatient Encounter Medications as of 11/25/2017  Medication Sig  . CALCIUM-MAGNESIUM PO Take 1 tablet by mouth 2 (two) times a week.   . Cholecalciferol 2000 units TABS Take 6,000 Units by mouth daily.  . clobetasol ointment (TEMOVATE) 0.05 % Use 2-3 x weekly  . Coenzyme Q10 (COQ10) 100 MG CAPS Take 100 mg by  mouth daily.  . cyanocobalamin 500 MCG tablet Take 500 mcg by mouth daily.  . ferrous sulfate 325 (65 FE) MG tablet Take 1 tablet (325 mg total) by mouth 2 (two) times daily with a meal.  . meclizine (ANTIVERT) 25 MG tablet 1 TABLET EVERY SIX HOURS AS NEEDED ORAL  . milk thistle 175 MG tablet Take 175 mg by mouth daily.  . Multiple Vitamins-Minerals (ADULT GUMMY PO) Take 2 tablets by mouth daily.  . Multiple Vitamins-Minerals (HAIR SKIN AND NAILS FORMULA) TABS Take 2 tablets by mouth daily. 2500 mcg per serving  . naproxen sodium (ALEVE) 220 MG tablet Take 220 mg by mouth daily as needed (pain).  . Omega-3 Fatty Acids (FISH OIL) 1200 MG CAPS Take 1,200 mg by mouth daily.  . Selenium 100 MCG TABS Take 100 mcg by mouth daily.   . vitamin C (ASCORBIC ACID) 500 MG tablet Take 500 mg by mouth 2 (two) times a week.   . Wheat Dextrin (BENEFIBER) POWD Take 1 Scoop by mouth 2 (two) times a week.   No facility-administered encounter medications on file as of 11/25/2017.     ALLERGIES:  No Known Allergies   PHYSICAL EXAM:  ECOG Performance status: 0  Vitals:   11/25/17 1511  BP: 135/67  Pulse: 73  Resp: 18  Temp: 98.6 F (37 C)  SpO2: 98%   Filed Weights   11/25/17 1511  Weight: 143 lb 6.4 oz (65  kg)    Physical Exam  Constitutional: She is oriented to person, place, and time.  Cardiovascular: Normal rate, regular rhythm and normal heart sounds.  Pulmonary/Chest: Effort normal and breath sounds normal.  Neurological: She is alert and oriented to person, place, and time.  Skin: Skin is warm and dry.  Abdomen: Soft nontender with no palpable abnormality.   LABORATORY DATA:  I have reviewed the labs as listed.  CBC    Component Value Date/Time   WBC 4.8 11/08/2017 0913   RBC 4.32 11/08/2017 0913   HGB 13.0 11/08/2017 0913   HCT 39.1 11/08/2017 0913   HCT 42 02/25/2012 1033   PLT 177 11/08/2017 0913   MCV 90.5 11/08/2017 0913   MCH 30.1 11/08/2017 0913   MCHC 33.2 11/08/2017 0913   RDW 14.2 11/08/2017 0913   LYMPHSABS 1.5 06/14/2017 0915   MONOABS 0.3 06/14/2017 0915   EOSABS 0.1 06/14/2017 0915   BASOSABS 0.0 06/14/2017 0915   CMP Latest Ref Rng & Units 11/08/2017 07/05/2017 06/17/2017  Glucose 70 - 99 mg/dL 106(H) 96 105(H)  BUN 8 - 23 mg/dL 15 12 <5(L)  Creatinine 0.44 - 1.00 mg/dL 0.68 0.81 0.87  Sodium 135 - 145 mmol/L 140 140 141  Potassium 3.5 - 5.1 mmol/L 3.7 3.7 3.7  Chloride 98 - 111 mmol/L 108 108 106  CO2 22 - 32 mmol/L _0 Calcium 8.9 - 10.3 mg/dL 9.1 9.7 8.8(L)  Total Protein 6.5 - 8.1 g/dL 7.7 - -  Total Bilirubin 0.3 - 1.2 mg/dL 0.4 - -  Alkaline Phos 38 - 126 U/L 74 - -  AST 15 - 41 U/L 28 - -  ALT 0 - 44 U/L 25 - -          ASSESSMENT & PLAN:   Primary cancer of hepatic flexure of colon ([facility]) 1.  Stage II (T2N0) hepatic flexure colon adenocarcinoma: -MMR normal/MSI low -Her CEA level remains to be elevated at 6.3 a month after surgery.  CT scan of the chest did  not show any metastatic disease. -PET CT scan on 08/16/2017 did not show any evidence of recurrence or metastatic disease.  Her CEA elevation is likely from her liver disease.  She had a history of hepatitis C and was treated for it. -We have reviewed results of her blood work  today.  Hemoglobin normalized.  CEA is continuing to be elevated at 6.8 but stable. -We will continue surveillance visits every 3 months with blood work, CEA level.  We plan to repeat CT scan of the abdomen and pelvis once every 6 months for the first 2 years followed by yearly scans up to year 5.  She will have a colonoscopy 1 year after the last one.  2.  Iron deficiency state: She stopped taking iron pills.  Ferritin was within normal limits.  Hemoglobin has normalized.  She is taking Benefiber since the surgery which is helping.      Orders placed this encounter:  Orders Placed This Encounter  Procedures  . CT Abdomen Pelvis W Contrast  . CEA  . CBC with Differential/Platelet  . Comprehensive metabolic panel  . Ferritin  . Iron and TIBC      Derek Jack, MD [place] [phone]

## 2017-12-02 ENCOUNTER — Ambulatory Visit (HOSPITAL_COMMUNITY): Payer: 59 | Admitting: Hematology

## 2018-01-05 ENCOUNTER — Ambulatory Visit (INDEPENDENT_AMBULATORY_CARE_PROVIDER_SITE_OTHER): Payer: 59 | Admitting: Gastroenterology

## 2018-01-05 ENCOUNTER — Encounter: Payer: Self-pay | Admitting: Gastroenterology

## 2018-01-05 VITALS — BP 114/74 | HR 71 | Temp 97.2°F | Ht 65.5 in | Wt 146.2 lb

## 2018-01-05 DIAGNOSIS — C183 Malignant neoplasm of hepatic flexure: Secondary | ICD-10-CM | POA: Diagnosis not present

## 2018-01-05 DIAGNOSIS — K7469 Other cirrhosis of liver: Secondary | ICD-10-CM

## 2018-01-05 MED ORDER — HYDROCORTISONE ACETATE 25 MG RE SUPP: 25.0000 mg | Freq: Two times a day (BID) | RECTAL | 1 refills | Status: DC

## 2018-01-05 NOTE — Patient Instructions (Signed)
I have sent in the suppositories to take twice a day for 5 days, repeat again if needed.  Please call if this does not improve symptoms.   If you keep having issues, I recommend a colonoscopy. Otherwise, we can plan on doing the colonoscopy and upper endoscopy in Jan 2020.  It was so good to see you again!  I enjoyed seeing you again today! As you know, I value our relationship and want to provide genuine, compassionate, and quality care. I welcome your feedback. If you receive a survey regarding your visit,  I greatly appreciate you taking time to fill this out. See you next time!  Annitta Needs, PhD, ANP-BC [facility]

## 2018-01-05 NOTE — Progress Notes (Signed)
Referring Provider: Sharilyn Sites, MD Primary Care Physician:  Sharilyn Sites, MD Primary GI: Dr. Gala Romney    Chief Complaint  Patient presents with  . Rectal Bleeding    spotting every now and then. Thinks it is related to hemorrhoids. She notices some discharge    HPI:   Ariana Long is a 62 y.o. female presenting today with a history of Hepatitis C, s/p eradication. Prior MRI with nodular liver border, F3/F4 on Fibrosure. Diagnosed with stage 2 colon cancer Jan 2019, undergoing laparoscopic right colectomy.  EGD in 2019 for surveillance, due to history of cirrhosis. Due for colonoscopy Jan 2020.   Some mucus discharge at times. Sometimes itching at rectum. No pain with BMs. No straining. Stomach is doing a lot of growling. Probiotic daily. Taking 2 tsps fiber daily and wonders if it's upsetting her stomach. Wants to hold off on colonoscopy and wait until Jan 2020, as this will be one year post initial diagnosis.   Past Medical History:  Diagnosis Date  . Cirrhosis of liver ([place]) 01/26/2011   FIBROSIS on liver biopsy in 2007, U/S  03/31/2013= mild diffuse hepatic steatosis and /or hepatocellular disease without focal hepatic parenchymal abnormality. per Dr. Patsy Baltimore (2012) pt is immune to hep A and Hep B. per 12/06/13 note from Liver clinic-pt had MRI which showed cirrhosis 2015.  . Diverticulitis of colon   . Elevated AFP 01/26/2011  . Gall stones   . Hematuria 12/12/2014  . Hemorrhoid 12/01/2012  . Hemorrhoids   . Hepatitis C 01/26/2011   2005 non responder, genotype 1, Gr 1-2, Stage 3 . Treated with Harvoni at the Liver clinic, responder  . Hidradenitis   . History of hiatal hernia   . Hx: UTI (urinary tract infection)   . Kidney stones 10/2011  . Osteoporosis   . Primary cancer of hepatic flexure of colon ([place]) 06/15/2017  . Serrated adenoma of colon 11/2011  . Splenomegaly 01/26/2011  . Thrombocytopenia ([place]) 01/26/2011  . Tubular adenoma   . Vulval lesion 12/14/2013   Has kissing  lesions thin and puffy and paler i color about 5-6- mm in diameter.Dr Glo Herring in to co examine will try temovate and recheck in 3 months    Past Surgical History:  Procedure Laterality Date  . AXILLARY SURGERY Bilateral 1979   "glands removed"  . BREAST BIOPSY Right 2011  . BREAST EXCISIONAL BIOPSY Bilateral 25 years ago   Benign, fatty tumors  . CESAREAN SECTION     x 2  . COLECTOMY  06/15/2017   LAPAROSCOPIC ASSISTED RIGHT COLECTOMY, ERAS PATHWAY/notes 06/15/2017  . COLONOSCOPY  07/31/2005   Rourk-Normal rectum/Normal colonoscopy/Repeat screening colonoscopy ten years  . COLONOSCOPY  11/16/2011   RMR: Friable anorectum-likely source of hematochezia/ LARGE 1.2x2.5CM SERRATED ADENOMA resected from ascending colon 7CM DISTAL TO ICV, hyperplastic polyp removed   . COLONOSCOPY N/A 07/18/2012   Dr. Gala Romney- normal appearing rectal mucosa. no residual polpy tissue seen at tatoo location. remainder of colonic mucosa appeared entirely normal. bx = tubular adenoma  . COLONOSCOPY N/A 08/29/2015   Dr. Gala Romney: single 12 mm polyp in ascending colon, 4 mm polyp ascending. Sessile serrated adenoma. 3 year surveillance  . COLONOSCOPY N/A 05/19/2017   4X2 cm sessile lesion at hepatic flexdure, unable to be completely removed  . ESOPHAGEAL BANDING N/A 08/29/2015   Procedure: ESOPHAGEAL BANDING;  Surgeon: Daneil Dolin, MD;  [facility];  Service: Endoscopy;  Laterality: N/A;  . ESOPHAGOGASTRODUODENOSCOPY N/A 08/29/2015  Dr. Gala Romney: non-critical Schatzki's ring, somewhat prominent small submucosal vessels without obvious varices. hyperplastic gastric polyp. 2 year surveillance  . HEMORRHOID BANDING    . LAPAROSCOPIC RIGHT COLECTOMY N/A 06/15/2017   Procedure: LAPAROSCOPIC ASSISTED RIGHT COLECTOMY, ERAS PATHWAY;  Surgeon: Fanny Skates, MD;  Location: [facility];  Service: General;  Laterality: N/A;  . POLYPECTOMY  05/19/2017   Procedure: POLYPECTOMY;  Surgeon: Daneil Dolin, MD;  [facility];  Service: Endoscopy;;  . SKIN LESION EXCISION      Current Outpatient Medications  Medication Sig Dispense Refill  . CALCIUM-MAGNESIUM PO Take 1 tablet by mouth 2 (two) times a week.     . Cholecalciferol 2000 units TABS Take 6,000 Units by mouth daily.    . clobetasol ointment (TEMOVATE) 0.05 % Use 2-3 x weekly 30 g 2  . Coenzyme Q10 (COQ10) 100 MG CAPS Take 100 mg by mouth daily.    . cyanocobalamin 500 MCG tablet Take 500 mcg by mouth daily.    . milk thistle 175 MG tablet Take 175 mg by mouth daily.    . Multiple Vitamins-Minerals (ADULT GUMMY PO) Take 2 tablets by mouth daily.    . Multiple Vitamins-Minerals (HAIR SKIN AND NAILS FORMULA) TABS Take 2 tablets by mouth daily. 2500 mcg per serving    . naproxen sodium (ALEVE) 220 MG tablet Take 220 mg by mouth daily as needed (pain).    . Omega-3 Fatty Acids (FISH OIL) 1200 MG CAPS Take 1,200 mg by mouth daily.    . Selenium 100 MCG TABS Take 100 mcg by mouth daily.     . vitamin C (ASCORBIC ACID) 500 MG tablet Take 500 mg by mouth 2 (two) times a week.     . Wheat Dextrin (BENEFIBER) POWD Take 1 Scoop by mouth daily.     . hydrocortisone (ANUSOL-HC) 25 MG suppository Place 1 suppository (25 mg total) rectally every 12 (twelve) hours. 12 suppository 1   No current facility-administered medications for this visit.     Allergies as of 01/05/2018  . (No Known Allergies)    Family History  Problem Relation Age of Onset  . Heart attack Mother   . Stroke Mother   . Aneurysm Father   . Colon cancer Neg Hx     Social History   Socioeconomic History  . Marital status: Married    Spouse name: Not on file  . Number of children: 2  . Years of education: Not on file  . Highest education level: Not on file  Occupational History  . Occupation: Occupational psychologist: [employer]  . Financial resource strain: Not hard at all  . Food insecurity:    Worry: Never true    Inability: Never true  .  Transportation needs:    Medical: No    Non-medical: No  Tobacco Use  . Smoking status: Never Smoker  . Smokeless tobacco: Never Used  Substance and Sexual Activity  . Alcohol use: No  . Drug use: No  . Sexual activity: Yes    Birth control/protection: Post-menopausal  Lifestyle  . Physical activity:    Days per week: 3 days    Minutes per session: 30 min  . Stress: Not at all  Relationships  . Social connections:    Talks on phone: More than three times a week    Gets together: More than three times a week    Attends religious service: More than 4 times  per year    Active member of club or organization: Yes    Attends meetings of clubs or organizations: More than 4 times per year    Relationship status: Married  Other Topics Concern  . Not on file  Social History Narrative  . Not on file    Review of Systems: Gen: Denies fever, chills, anorexia. Denies fatigue, weakness, weight loss.  CV: Denies chest pain, palpitations, syncope, peripheral edema, and claudication. Resp: Denies dyspnea at rest, cough, wheezing, coughing up blood, and pleurisy. GI: see HPI  Derm: Denies rash, itching, dry skin Psych: Denies depression, anxiety, memory loss, confusion. No homicidal or suicidal ideation.  Heme: see HPI   Physical Exam: BP 114/74   Pulse 71   Temp (!) 97.2 F (36.2 C) (Oral)   Ht 5' 5.5" (1.664 m)   Wt 146 lb 3.2 oz (66.3 kg)   BMI 23.96 kg/m  General:   Alert and oriented. No distress noted. Pleasant and cooperative.  Head:  Normocephalic and atraumatic. Eyes:  Conjuctiva clear without scleral icterus. Mouth:  Oral mucosa pink and moist.  Abdomen:  +BS, soft, non-tender and non-distended. No rebound or guarding. No HSM or masses noted. Msk:  Symmetrical without gross deformities. Normal posture. Extremities:  Without edema. Neurologic:  Alert and  oriented x4 Psych:  Alert and cooperative. Normal mood and affect.  Lab Results  Component Value Date   WBC 4.8  11/08/2017   HGB 13.0 11/08/2017   HCT 39.1 11/08/2017   MCV 90.5 11/08/2017   PLT 177 11/08/2017   Lab Results  Component Value Date   CREATININE 0.68 11/08/2017   BUN 15 11/08/2017   NA 140 11/08/2017   K 3.7 11/08/2017   CL 108 11/08/2017   CO2 23 11/08/2017   Lab Results  Component Value Date   IRON 55 11/08/2017   TIBC 353 11/08/2017   FERRITIN 42 11/08/2017

## 2018-01-10 ENCOUNTER — Telehealth: Payer: Self-pay | Admitting: Gastroenterology

## 2018-01-10 ENCOUNTER — Encounter: Payer: Self-pay | Admitting: Gastroenterology

## 2018-01-10 NOTE — Assessment & Plan Note (Signed)
62 year old female diagnosed with stage II colon cancer Jan 2019, s/p laparoscopic assisted right colectomy. Due for surveillance colonoscopy Jan 2020. Low-volume rectal bleeding, which is likely due to internal hemorrhoids, but I have advised colonoscopy now due to her high risk. However, she would like to wait and trial supportive measures currently. Will trial Anusol suppositories. She will call if no improvement. Return in Dec/Jan to arrange colonoscopy/EGD (EGD due to history of cirrhosis).

## 2018-01-10 NOTE — Assessment & Plan Note (Signed)
History of Hep C s/p eradication. CT upcoming ordered by Oncology. Continue with serial imaging of liver in future. EGD due now but electing to wait until time of colonoscopy in several months.

## 2018-01-10 NOTE — Telephone Encounter (Signed)
Can we make sure she has an appt for Dec/Jan? Need to arrange colonoscopy/EGD.

## 2018-01-11 ENCOUNTER — Encounter: Payer: Self-pay | Admitting: Internal Medicine

## 2018-01-11 ENCOUNTER — Telehealth: Payer: Self-pay | Admitting: Adult Health

## 2018-01-11 MED ORDER — FLUCONAZOLE 150 MG PO TABS: ORAL_TABLET | ORAL | 1 refills | Status: DC

## 2018-01-11 NOTE — Telephone Encounter (Signed)
Pt has yeast, will rx diflucan to [facility]

## 2018-01-11 NOTE — Telephone Encounter (Signed)
Spoke with pt. Pt was given med for yeast infection the beginning of this year. Pt has another yeast infection. Has not tried anything OTC. Can you prescribe something or does she need to be seen? Thanks!! [place]

## 2018-01-11 NOTE — Telephone Encounter (Signed)
PATIENT SCHEDULED AND LETTER SENT  °

## 2018-01-11 NOTE — Progress Notes (Signed)
CC'D TO PCP °

## 2018-02-16 ENCOUNTER — Inpatient Hospital Stay (HOSPITAL_COMMUNITY): Payer: 59 | Attending: Hematology

## 2018-02-16 DIAGNOSIS — C183 Malignant neoplasm of hepatic flexure: Secondary | ICD-10-CM

## 2018-02-16 DIAGNOSIS — B1920 Unspecified viral hepatitis C without hepatic coma: Secondary | ICD-10-CM | POA: Diagnosis not present

## 2018-02-16 DIAGNOSIS — E611 Iron deficiency: Secondary | ICD-10-CM | POA: Diagnosis not present

## 2018-02-16 LAB — CBC WITH DIFFERENTIAL/PLATELET
Abs Immature Granulocytes: 0.01 10*3/uL (ref 0.00–0.07)
BASOS PCT: 1 %
Basophils Absolute: 0 10*3/uL (ref 0.0–0.1)
EOS ABS: 0.1 10*3/uL (ref 0.0–0.5)
Eosinophils Relative: 2 %
HCT: 42.9 % (ref 36.0–46.0)
Hemoglobin: 13.7 g/dL (ref 12.0–15.0)
IMMATURE GRANULOCYTES: 0 %
LYMPHS ABS: 1.6 10*3/uL (ref 0.7–4.0)
Lymphocytes Relative: 41 %
MCH: 29.5 pg (ref 26.0–34.0)
MCHC: 31.9 g/dL (ref 30.0–36.0)
MCV: 92.3 fL (ref 80.0–100.0)
MONO ABS: 0.4 10*3/uL (ref 0.1–1.0)
Monocytes Relative: 9 %
NEUTROS PCT: 47 %
Neutro Abs: 1.8 10*3/uL (ref 1.7–7.7)
PLATELETS: 161 10*3/uL (ref 150–400)
RBC: 4.65 MIL/uL (ref 3.87–5.11)
RDW: 13 % (ref 11.5–15.5)
WBC: 3.9 10*3/uL — ABNORMAL LOW (ref 4.0–10.5)
nRBC: 0 % (ref 0.0–0.2)

## 2018-02-16 LAB — COMPREHENSIVE METABOLIC PANEL
ALBUMIN: 4.4 g/dL (ref 3.5–5.0)
ALT: 24 U/L (ref 0–44)
ANION GAP: 9 (ref 5–15)
AST: 27 U/L (ref 15–41)
Alkaline Phosphatase: 70 U/L (ref 38–126)
BUN: 15 mg/dL (ref 8–23)
CHLORIDE: 103 mmol/L (ref 98–111)
CO2: 26 mmol/L (ref 22–32)
Calcium: 9.6 mg/dL (ref 8.9–10.3)
Creatinine, Ser: 0.79 mg/dL (ref 0.44–1.00)
GFR calc Af Amer: >60 mL/min (ref >60)
GFR calc non Af Amer: >60 mL/min (ref >60)
GLUCOSE: 91 mg/dL (ref 70–99)
Potassium: 3.9 mmol/L (ref 3.5–5.1)
SODIUM: 138 mmol/L (ref 135–145)
Total Bilirubin: 0.6 mg/dL (ref 0.3–1.2)
Total Protein: 8.1 g/dL (ref 6.5–8.1)

## 2018-02-16 LAB — IRON AND TIBC
Iron: 78 ug/dL (ref 28–170)
SATURATION RATIOS: 20 % (ref 10.4–31.8)
TIBC: 393 ug/dL (ref 250–450)
UIBC: 315 ug/dL

## 2018-02-16 LAB — FERRITIN: Ferritin: 37 ng/mL (ref 11–307)

## 2018-02-17 LAB — CEA: CEA: 7.7 ng/mL — ABNORMAL HIGH (ref 0.0–4.7)

## 2018-02-23 ENCOUNTER — Ambulatory Visit (HOSPITAL_COMMUNITY)
Admission: RE | Admit: 2018-02-23 | Discharge: 2018-02-23 | Disposition: A | Discharge status: Home / Self Care | Payer: 59 | Source: Ambulatory Visit | Attending: Nurse Practitioner | Admitting: Nurse Practitioner

## 2018-02-23 DIAGNOSIS — K5730 Diverticulosis of large intestine without perforation or abscess without bleeding: Secondary | ICD-10-CM | POA: Diagnosis not present

## 2018-02-23 DIAGNOSIS — C183 Malignant neoplasm of hepatic flexure: Secondary | ICD-10-CM | POA: Diagnosis not present

## 2018-02-23 MED ORDER — IOPAMIDOL (ISOVUE-300) INJECTION 61%
100.0000 mL | Freq: Once | INTRAVENOUS | Status: AC | PRN
  Administered 2018-02-23: 100 mL via INTRAVENOUS

## 2018-02-28 ENCOUNTER — Encounter (HOSPITAL_COMMUNITY): Payer: Self-pay | Admitting: Hematology

## 2018-02-28 ENCOUNTER — Other Ambulatory Visit: Payer: Self-pay

## 2018-02-28 ENCOUNTER — Inpatient Hospital Stay (HOSPITAL_BASED_OUTPATIENT_CLINIC_OR_DEPARTMENT_OTHER): Payer: 59 | Admitting: Hematology

## 2018-02-28 VITALS — BP 114/65 | HR 60 | Temp 98.1°F | Resp 16 | Wt 147.0 lb

## 2018-02-28 DIAGNOSIS — C183 Malignant neoplasm of hepatic flexure: Secondary | ICD-10-CM | POA: Diagnosis not present

## 2018-02-28 DIAGNOSIS — D509 Iron deficiency anemia, unspecified: Secondary | ICD-10-CM | POA: Diagnosis not present

## 2018-02-28 DIAGNOSIS — M818 Other osteoporosis without current pathological fracture: Secondary | ICD-10-CM

## 2018-02-28 NOTE — Assessment & Plan Note (Signed)
1.  Stage II (T2N0) hepatic flexure colon adenocarcinoma: -MMR normal/MSI low -Her CEA level remained elevated at 6.3 a month after surgery.  CT scan of the chest did not show any metastatic disease. -PET CT scan on 08/16/2017 did not show any evidence of recurrence or metastatic disease.  Her CEA elevation is likely from her liver disease.  She had a history of hepatitis C and was treated for it. - She complained of abdominal cramping on and off, which she thought was secondary to Benefiber.  She denied any bleeding per rectum or melena.  No new onset pains. -We discussed the results of the CEA level which was elevated at 7.7.  Her prior value was 6.8. -We also discussed about the CT scan dated 02/23/2018 which shows postoperative changes of partial right colectomy without definitive evidence of local or metastatic disease. - There is a mild narrowing in the region of the hepatic flexure, presumably at the surgical anastomosis. -She has colonoscopy scheduled in January 2020.  I will see her back in 3 months with repeat CEA level. -We will plan to continue CT scans once every 6 months during the first 2 years.  2.  Iron deficiency state: She is not on any iron pills at this time.  Her hemoglobin is within normal limits.

## 2018-02-28 NOTE — Progress Notes (Signed)
[place], [place] [address]   CLINIC:  Medical Oncology/Hematology  PCP:  Sharilyn Sites, [place] [address] [phone]   REASON FOR VISIT:  Follow-up for stage II colon cancer  CURRENT THERAPY: observation  BRIEF ONCOLOGIC HISTORY:    Primary cancer of hepatic flexure of colon ([place])   06/15/2017 Initial Diagnosis    Primary cancer of hepatic flexure of colon ([place])    06/15/2017 Cancer Staging    Staging form: Colon and Rectum - Neuroendocine Tumors, AJCC 8th Edition - Clinical stage from 06/15/2017: Stage IIA (cT2, cN0, cM0) - Signed by Derek Jack, MD on 08/18/2017      CANCER STAGING: Cancer Staging Primary cancer of hepatic flexure of colon ([place]) Staging form: Colon and Rectum - Neuroendocine Tumors, AJCC 8th Edition - Clinical stage from 06/15/2017: Stage IIA (cT2, cN0, cM0) - Signed by Derek Jack, MD on 08/18/2017    INTERVAL HISTORY:  Ariana Long 62 y.o. female returns for follow-up of stage II cancer.  She was a never smoker.  Denies any recent infections or hospitalizations.  No recent pneumonias reported.  Denies any fevers, night sweats or weight loss.  Appetite is 100% and energy is 100%.  No bleeding per rectum or melena reported.  She does have on and off abdominal cramping.   REVIEW OF SYSTEMS:  Review of Systems  All other systems reviewed and are negative.    PAST MEDICAL/SURGICAL HISTORY:  Past Medical History:  Diagnosis Date  . Cirrhosis of liver ([place]) 01/26/2011   FIBROSIS on liver biopsy in 2007, U/S  03/31/2013= mild diffuse hepatic steatosis and /or hepatocellular disease without focal hepatic parenchymal abnormality. per Dr. Patsy Baltimore (2012) pt is immune to hep A and Hep B. per 12/06/13 note from Liver clinic-pt had MRI which showed cirrhosis 2015.  . Diverticulitis of colon   . Elevated AFP 01/26/2011  . Gall stones   . Hematuria 12/12/2014  . Hemorrhoid 12/01/2012   . Hemorrhoids   . Hepatitis C 01/26/2011   2005 non responder, genotype 1, Gr 1-2, Stage 3 . Treated with Harvoni at the Liver clinic, responder  . Hidradenitis   . History of hiatal hernia   . Hx: UTI (urinary tract infection)   . Kidney stones 10/2011  . Osteoporosis   . Primary cancer of hepatic flexure of colon ([place]) 06/15/2017  . Serrated adenoma of colon 11/2011  . Splenomegaly 01/26/2011  . Thrombocytopenia ([facility]) 01/26/2011  . Tubular adenoma   . Vulval lesion 12/14/2013   Has kissing lesions thin and puffy and paler i color about 5-6- mm in diameter.Dr Glo Herring in to co examine will try temovate and recheck in 3 months   Past Surgical History:  Procedure Laterality Date  . AXILLARY SURGERY Bilateral 1979   "glands removed"  . BREAST BIOPSY Right 2011  . BREAST EXCISIONAL BIOPSY Bilateral 25 years ago   Benign, fatty tumors  . CESAREAN SECTION     x 2  . COLECTOMY  06/15/2017   LAPAROSCOPIC ASSISTED RIGHT COLECTOMY, ERAS PATHWAY/notes 06/15/2017  . COLONOSCOPY  07/31/2005   [facility]-Normal rectum/Normal colonoscopy/Repeat screening colonoscopy ten years  . COLONOSCOPY  11/16/2011   RMR: Friable anorectum-likely source of hematochezia/ LARGE 1.2x2.5CM SERRATED ADENOMA resected from ascending colon 7CM DISTAL TO ICV, hyperplastic polyp removed   . COLONOSCOPY N/A 07/18/2012   Dr. Gala Romney- normal appearing rectal mucosa. no residual polpy tissue seen at tatoo location. remainder of colonic mucosa appeared entirely  normal. bx = tubular adenoma  . COLONOSCOPY N/A 08/29/2015   Dr. Gala Romney: single 12 mm polyp in ascending colon, 4 mm polyp ascending. Sessile serrated adenoma. 3 year surveillance  . COLONOSCOPY N/A 05/19/2017   4X2 cm sessile lesion at hepatic flexdure, unable to be completely removed  . ESOPHAGEAL BANDING N/A 08/29/2015   Procedure: ESOPHAGEAL BANDING;  Surgeon: Daneil Dolin, MD;  [facility];  Service: Endoscopy;  Laterality: N/A;  . ESOPHAGOGASTRODUODENOSCOPY N/A  08/29/2015   Dr. Gala Romney: non-critical Schatzki's ring, somewhat prominent small submucosal vessels without obvious varices. hyperplastic gastric polyp. 2 year surveillance  . HEMORRHOID BANDING    . LAPAROSCOPIC RIGHT COLECTOMY N/A 06/15/2017   Procedure: LAPAROSCOPIC ASSISTED RIGHT COLECTOMY, ERAS PATHWAY;  Surgeon: Fanny Skates, MD;  Location: [facility];  Service: General;  Laterality: N/A;  . POLYPECTOMY  05/19/2017   Procedure: POLYPECTOMY;  Surgeon: Daneil Dolin, MD;  Location: [facility];  Service: Endoscopy;;  . SKIN LESION EXCISION       SOCIAL HISTORY:  Social History   Socioeconomic History  . Marital status: Married    Spouse name: Not on file  . Number of children: 2  . Years of education: Not on file  . Highest education level: Not on file  Occupational History  . Occupation: Occupational psychologist: [employer]  . Financial resource strain: Not hard at all  . Food insecurity:    Worry: Never true    Inability: Never true  . Transportation needs:    Medical: No    Non-medical: No  Tobacco Use  . Smoking status: Never Smoker  . Smokeless tobacco: Never Used  Substance and Sexual Activity  . Alcohol use: No  . Drug use: No  . Sexual activity: Yes    Birth control/protection: Post-menopausal  Lifestyle  . Physical activity:    Days per week: 3 days    Minutes per session: 30 min  . Stress: Not at all  Relationships  . Social connections:    Talks on phone: More than three times a week    Gets together: More than three times a week    Attends religious service: More than 4 times per year    Active member of club or organization: Yes    Attends meetings of clubs or organizations: More than 4 times per year    Relationship status: Married  . Intimate partner violence:    Fear of current or ex partner: No    Emotionally abused: No    Physically abused: No    Forced sexual activity: No  Other Topics Concern  . Not on file  Social  History Narrative  . Not on file    FAMILY HISTORY:  Family History  Problem Relation Age of Onset  . Heart attack Mother   . Stroke Mother   . Aneurysm Father   . Colon cancer Neg Hx     CURRENT MEDICATIONS:  Outpatient Encounter Medications as of 02/28/2018  Medication Sig  . CALCIUM-MAGNESIUM PO Take 1 tablet by mouth 2 (two) times a week.   . Cholecalciferol 2000 units TABS Take 6,000 Units by mouth daily.  . clobetasol ointment (TEMOVATE) 0.05 % Use 2-3 x weekly  . Coenzyme Q10 (COQ10) 100 MG CAPS Take 100 mg by mouth daily.  . cyanocobalamin 500 MCG tablet Take 500 mcg by mouth daily.  . hydrocortisone (ANUSOL-HC) 25 MG suppository Place 1 suppository (25 mg total) rectally  every 12 (twelve) hours.  . milk thistle 175 MG tablet Take 175 mg by mouth daily.  . Multiple Vitamins-Minerals (ADULT GUMMY PO) Take 2 tablets by mouth daily.  . Multiple Vitamins-Minerals (HAIR SKIN AND NAILS FORMULA) TABS Take 2 tablets by mouth daily. 2500 mcg per serving  . naproxen sodium (ALEVE) 220 MG tablet Take 220 mg by mouth daily as needed (pain).  . Omega-3 Fatty Acids (FISH OIL) 1200 MG CAPS Take 1,200 mg by mouth daily.  . Selenium 100 MCG TABS Take 100 mcg by mouth daily.   . vitamin C (ASCORBIC ACID) 500 MG tablet Take 500 mg by mouth 2 (two) times a week.   . Wheat Dextrin (BENEFIBER) POWD Take 1 Scoop by mouth daily.   . [DISCONTINUED] fluconazole (DIFLUCAN) 150 MG tablet Take 1 now and repeat 1 in 3 days (Patient not taking: Reported on 02/28/2018)   No facility-administered encounter medications on file as of 02/28/2018.     ALLERGIES:  No Known Allergies   PHYSICAL EXAM:  ECOG Performance status: 0  Vitals:   02/28/18 1138  BP: 114/65  Pulse: 60  Resp: 16  Temp: 98.1 F (36.7 C)  SpO2: 100%   Filed Weights   02/28/18 1138  Weight: 147 lb (66.7 kg)    Physical Exam  Constitutional: She is oriented to person, place, and time.  Cardiovascular: Normal rate,  regular rhythm and normal heart sounds.  Pulmonary/Chest: Effort normal and breath sounds normal.  Neurological: She is alert and oriented to person, place, and time.  Skin: Skin is warm and dry.  Abdomen: Soft nontender with no palpable abnormality.   LABORATORY DATA:  I have reviewed the labs as listed.  CBC    Component Value Date/Time   WBC 3.9 (L) 02/16/2018 1040   RBC 4.65 02/16/2018 1040   HGB 13.7 02/16/2018 1040   HCT 42.9 02/16/2018 1040   HCT 42 02/25/2012 1033   PLT 161 02/16/2018 1040   MCV 92.3 02/16/2018 1040   MCH 29.5 02/16/2018 1040   MCHC 31.9 02/16/2018 1040   RDW 13.0 02/16/2018 1040   LYMPHSABS 1.6 02/16/2018 1040   MONOABS 0.4 02/16/2018 1040   EOSABS 0.1 02/16/2018 1040   BASOSABS 0.0 02/16/2018 1040   CMP Latest Ref Rng & Units 02/16/2018 11/08/2017 07/05/2017  Glucose 70 - 99 mg/dL 91 106(H) 96  BUN 8 - 23 mg/dL '15 15 12  ' Creatinine 0.44 - 1.00 mg/dL 0.79 0.68 0.81  Sodium 135 - 145 mmol/L 138 140 140  Potassium 3.5 - 5.1 mmol/L 3.9 3.7 3.7  Chloride 98 - 111 mmol/L 103 108 108  CO2 22 - 32 mmol/L '26 23 22  ' Calcium 8.9 - 10.3 mg/dL 9.6 9.1 9.7  Total Protein 6.5 - 8.1 g/dL 8.1 7.7 -  Total Bilirubin 0.3 - 1.2 mg/dL 0.6 0.4 -  Alkaline Phos 38 - 126 U/L 70 74 -  AST 15 - 41 U/L 27 28 -  ALT 0 - 44 U/L 24 25 -      Radiology: I have independently reviewed CT scan images and discussed with the patient.   ASSESSMENT & PLAN:   Primary cancer of hepatic flexure of colon ([place]) 1.  Stage II (T2N0) hepatic flexure colon adenocarcinoma: -MMR normal/MSI low -Her CEA level remained elevated at 6.3 a month after surgery.  CT scan of the chest did not show any metastatic disease. -PET CT scan on 08/16/2017 did not show any evidence of recurrence or metastatic disease.  Her CEA elevation is likely from her liver disease.  She had a history of hepatitis C and was treated for it. - She complained of abdominal cramping on and off, which she thought was  secondary to Benefiber.  She denied any bleeding per rectum or melena.  No new onset pains. -We discussed the results of the CEA level which was elevated at 7.7.  Her prior value was 6.8. -We also discussed about the CT scan dated 02/23/2018 which shows postoperative changes of partial right colectomy without definitive evidence of local or metastatic disease. - There is a mild narrowing in the region of the hepatic flexure, presumably at the surgical anastomosis. -She has colonoscopy scheduled in January 2020.  I will see her back in 3 months with repeat CEA level. -We will plan to continue CT scans once every 6 months during the first 2 years.  2.  Iron deficiency state: She is not on any iron pills at this time.  Her hemoglobin is within normal limits.      Orders placed this encounter:  Orders Placed This Encounter  Procedures  . CEA  . Comprehensive metabolic panel  . CBC with Differential  . Ferritin  . Iron and TIBC      Derek Jack, MD [place] [phone]

## 2018-04-11 ENCOUNTER — Other Ambulatory Visit (HOSPITAL_COMMUNITY): Payer: Self-pay | Admitting: Family Medicine

## 2018-04-11 DIAGNOSIS — Z1231 Encounter for screening mammogram for malignant neoplasm of breast: Secondary | ICD-10-CM

## 2018-04-20 ENCOUNTER — Ambulatory Visit (HOSPITAL_COMMUNITY)
Admission: RE | Admit: 2018-04-20 | Discharge: 2018-04-20 | Disposition: A | Discharge status: Home / Self Care | Payer: 59 | Source: Ambulatory Visit | Attending: Family Medicine | Admitting: Family Medicine

## 2018-04-20 DIAGNOSIS — Z1231 Encounter for screening mammogram for malignant neoplasm of breast: Secondary | ICD-10-CM | POA: Insufficient documentation

## 2018-04-21 ENCOUNTER — Ambulatory Visit: Payer: 59 | Admitting: Gastroenterology

## 2018-05-31 ENCOUNTER — Other Ambulatory Visit (HOSPITAL_COMMUNITY): Payer: Self-pay | Admitting: Family Medicine

## 2018-05-31 DIAGNOSIS — E2839 Other primary ovarian failure: Secondary | ICD-10-CM

## 2018-06-01 ENCOUNTER — Inpatient Hospital Stay (HOSPITAL_COMMUNITY): Payer: Medicare Other | Attending: Hematology

## 2018-06-01 DIAGNOSIS — D509 Iron deficiency anemia, unspecified: Secondary | ICD-10-CM | POA: Insufficient documentation

## 2018-06-01 DIAGNOSIS — M818 Other osteoporosis without current pathological fracture: Secondary | ICD-10-CM | POA: Diagnosis not present

## 2018-06-01 DIAGNOSIS — C183 Malignant neoplasm of hepatic flexure: Secondary | ICD-10-CM | POA: Diagnosis not present

## 2018-06-01 LAB — COMPREHENSIVE METABOLIC PANEL
ALK PHOS: 62 U/L (ref 38–126)
ALT: 22 U/L (ref 0–44)
AST: 26 U/L (ref 15–41)
Albumin: 4.5 g/dL (ref 3.5–5.0)
Anion gap: 7 (ref 5–15)
BILIRUBIN TOTAL: 0.6 mg/dL (ref 0.3–1.2)
BUN: 10 mg/dL (ref 8–23)
CALCIUM: 9.5 mg/dL (ref 8.9–10.3)
CO2: 25 mmol/L (ref 22–32)
Chloride: 103 mmol/L (ref 98–111)
Creatinine, Ser: 0.74 mg/dL (ref 0.44–1.00)
GFR calc non Af Amer: >60 mL/min (ref >60)
GLUCOSE: 92 mg/dL (ref 70–99)
Potassium: 3.7 mmol/L (ref 3.5–5.1)
Sodium: 135 mmol/L (ref 135–145)
TOTAL PROTEIN: 8.3 g/dL — AB (ref 6.5–8.1)

## 2018-06-01 LAB — CBC WITH DIFFERENTIAL/PLATELET
ABS IMMATURE GRANULOCYTES: 0 10*3/uL (ref 0.00–0.07)
BASOS ABS: 0.1 10*3/uL (ref 0.0–0.1)
Basophils Relative: 2 %
EOS ABS: 0.1 10*3/uL (ref 0.0–0.5)
Eosinophils Relative: 2 %
HEMATOCRIT: 41.3 % (ref 36.0–46.0)
HEMOGLOBIN: 13.4 g/dL (ref 12.0–15.0)
IMMATURE GRANULOCYTES: 0 %
LYMPHS ABS: 1.5 10*3/uL (ref 0.7–4.0)
LYMPHS PCT: 43 %
MCH: 30.1 pg (ref 26.0–34.0)
MCHC: 32.4 g/dL (ref 30.0–36.0)
MCV: 92.8 fL (ref 80.0–100.0)
MONOS PCT: 10 %
Monocytes Absolute: 0.3 10*3/uL (ref 0.1–1.0)
NEUTROS PCT: 43 %
Neutro Abs: 1.4 10*3/uL — ABNORMAL LOW (ref 1.7–7.7)
Platelets: 128 10*3/uL — ABNORMAL LOW (ref 150–400)
RBC: 4.45 MIL/uL (ref 3.87–5.11)
RDW: 12.9 % (ref 11.5–15.5)
WBC: 3.4 10*3/uL — ABNORMAL LOW (ref 4.0–10.5)
nRBC: 0 % (ref 0.0–0.2)

## 2018-06-01 LAB — IRON AND TIBC
Iron: 80 ug/dL (ref 28–170)
Saturation Ratios: 24 % (ref 10.4–31.8)
TIBC: 331 ug/dL (ref 250–450)
UIBC: 251 ug/dL

## 2018-06-01 LAB — FERRITIN: Ferritin: 56 ng/mL (ref 11–307)

## 2018-06-02 LAB — CEA: CEA: 8.9 ng/mL — ABNORMAL HIGH (ref 0.0–4.7)

## 2018-06-08 ENCOUNTER — Other Ambulatory Visit: Payer: Self-pay

## 2018-06-08 ENCOUNTER — Encounter (HOSPITAL_COMMUNITY): Payer: Self-pay | Admitting: Hematology

## 2018-06-08 ENCOUNTER — Inpatient Hospital Stay (HOSPITAL_COMMUNITY): Payer: Medicare Other | Attending: Hematology | Admitting: Hematology

## 2018-06-08 VITALS — BP 119/68 | HR 95 | Temp 97.9°F | Resp 16 | Wt 143.0 lb

## 2018-06-08 DIAGNOSIS — M818 Other osteoporosis without current pathological fracture: Secondary | ICD-10-CM | POA: Diagnosis not present

## 2018-06-08 DIAGNOSIS — E611 Iron deficiency: Secondary | ICD-10-CM | POA: Diagnosis not present

## 2018-06-08 DIAGNOSIS — C183 Malignant neoplasm of hepatic flexure: Secondary | ICD-10-CM | POA: Insufficient documentation

## 2018-06-08 DIAGNOSIS — D509 Iron deficiency anemia, unspecified: Secondary | ICD-10-CM | POA: Diagnosis not present

## 2018-06-08 DIAGNOSIS — Z79899 Other long term (current) drug therapy: Secondary | ICD-10-CM | POA: Insufficient documentation

## 2018-06-08 NOTE — Assessment & Plan Note (Signed)
1.  Stage II (T2N0) hepatic flexure colon adenocarcinoma: -Status post right colon segmental resection on 06/15/2017, 3.2 cm tumor, 0/12 lymph nodes positive.  MMR normal/MSI low -CEA a month after surgery was elevated at 6.3.  CT of the chest did not show any metastatic disease.  -PET/CT scan on 08/16/2017 did not show any evidence of recurrence or metastatic disease.  Her CEA elevation is likely from her liver disease.  She had a history of hepatitis C and was treated for it. -CT scan of the abdomen and pelvis on 02/23/2018 shows postoperative changes of partial right colectomy without definite evidence of local or metastatic disease.  There is mild narrowing in the region of hepatic flexure, presumably at surgical anastomosis. - Today we discussed the results from 06/01/2018.  CEA continues to go high at 8.9.  Prior CEA 3 months ago was 7.7. -She denies any change in bowel habits or bleeding per rectum.  No abdominal pains reported. -I have recommended doing a PET CT scan.  We will see her back after the scan.  2.  Iron deficiency state: -Her ferritin is above 50.  Hemoglobin is 13.4.  She is not on any iron supplements.

## 2018-06-08 NOTE — Patient Instructions (Signed)
[facility] Discharge Instructions     Thank you for choosing [facility] at [facility] to provide your oncology and hematology care.  To afford each patient quality time with our provider, please arrive at least 15 minutes before your scheduled appointment time.   If you have a lab appointment with the Cancer Center please come in thru the  Main Entrance and check in at the main information desk  You need to re-schedule your appointment should you arrive 10 or more minutes late.  We strive to give you quality time with our providers, and arriving late affects you and other patients whose appointments are after yours.  Also, if you no show three or more times for appointments you may be dismissed from the clinic at the providers discretion.     Again, thank you for choosing [facility].  Our hope is that these requests will decrease the amount of time that you wait before being seen by our physicians.       _____________________________________________________________  Should you have questions after your visit to [facility], please contact our office at [phone] between the hours of 8:00 a.m. and 4:30 p.m.  Voicemails left after 4:00 p.m. will not be returned until the following business day.  For prescription refill requests, have your pharmacy contact our office and allow 72 hours.    Cancer Center Support Programs:   > Cancer Support Group  2nd Tuesday of the month 1pm-2pm, Journey Room    

## 2018-06-08 NOTE — Progress Notes (Signed)
Ariana Long, Ariana Long [id number]   CLINIC:  Medical Oncology/Hematology  PCP:  Sharilyn Sites, [place] [address] [phone]   REASON FOR VISIT: Follow-up for stage II colon cancer  CURRENT THERAPY: observation  BRIEF ONCOLOGIC HISTORY:    Primary cancer of hepatic flexure of colon ([place])   06/15/2017 Initial Diagnosis    Primary cancer of hepatic flexure of colon ([place])    06/15/2017 Cancer Staging    Staging form: Colon and Rectum - Neuroendocine Tumors, AJCC 8th Edition - Clinical stage from 06/15/2017: Stage IIA (cT2, cN0, cM0) - Signed by Derek Jack, MD on 08/18/2017      CANCER STAGING: Cancer Staging Primary cancer of hepatic flexure of colon ([place]) Staging form: Colon and Rectum - Neuroendocine Tumors, AJCC 8th Edition - Clinical stage from 06/15/2017: Stage IIA (cT2, cN0, cM0) - Signed by Derek Jack, MD on 08/18/2017    INTERVAL HISTORY:  Ariana Long 63 y.o. female returns for routine follow-up for colon cancer. She is here today and reports she has been feeling well since her last visit. Denies any nausea, vomiting, or diarrhea. Denies any new abdominal pains. Had not noticed any recent bleeding such as epistaxis, hematuria or hematochezia. Denies recent chest pain on exertion, shortness of breath on minimal exertion, pre-syncopal episodes, or palpitations. Denies any numbness or tingling in hands or feet. Denies any recent fevers, infections, or recent hospitalizations. Patient reports appetite at 100% and energy level at 100%. She is eating well and maintaining her weight with no problem.     REVIEW OF SYSTEMS:  Review of Systems  All other systems reviewed and are negative.    PAST MEDICAL/SURGICAL HISTORY:  Past Medical History:  Diagnosis Date  . Cirrhosis of liver ([place]) 01/26/2011   FIBROSIS on liver biopsy in 2007, U/S  03/31/2013= mild diffuse hepatic steatosis and /or  hepatocellular disease without focal hepatic parenchymal abnormality. per Dr. Patsy Baltimore (2012) pt is immune to hep A and Hep B. per 12/06/13 note from Liver clinic-pt had MRI which showed cirrhosis 2015.  . Diverticulitis of colon   . Elevated AFP 01/26/2011  . Gall stones   . Hematuria 12/12/2014  . Hemorrhoid 12/01/2012  . Hemorrhoids   . Hepatitis C 01/26/2011   2005 non responder, genotype 1, Gr 1-2, Stage 3 . Treated with Harvoni at the Liver clinic, responder  . Hidradenitis   . History of hiatal hernia   . Hx: UTI (urinary tract infection)   . Kidney stones 10/2011  . Osteoporosis   . Primary cancer of hepatic flexure of colon ([place]) 06/15/2017  . Serrated adenoma of colon 11/2011  . Splenomegaly 01/26/2011  . Thrombocytopenia ([place]) 01/26/2011  . Tubular adenoma   . Vulval lesion 12/14/2013   Has kissing lesions thin and puffy and paler i color about 5-6- mm in diameter.Dr Glo Herring in to co examine will try temovate and recheck in 3 months   Past Surgical History:  Procedure Laterality Date  . AXILLARY SURGERY Bilateral 1979   "glands removed"  . BREAST BIOPSY Right 2011  . BREAST EXCISIONAL BIOPSY Bilateral 25 years ago   Benign, fatty tumors  . CESAREAN SECTION     x 2  . COLECTOMY  06/15/2017   LAPAROSCOPIC ASSISTED RIGHT COLECTOMY, ERAS PATHWAY/notes 06/15/2017  . COLONOSCOPY  07/31/2005   [place]-Normal rectum/Normal colonoscopy/Repeat screening colonoscopy ten years  . COLONOSCOPY  11/16/2011   RMR: Friable anorectum-likely source of hematochezia/ LARGE 1.2x2.5CM  SERRATED ADENOMA resected from ascending colon 7CM DISTAL TO ICV, hyperplastic polyp removed   . COLONOSCOPY N/A 07/18/2012   Dr. Gala Romney- normal appearing rectal mucosa. no residual polpy tissue seen at tatoo location. remainder of colonic mucosa appeared entirely normal. bx = tubular adenoma  . COLONOSCOPY N/A 08/29/2015   Dr. Gala Romney: single 12 mm polyp in ascending colon, 4 mm polyp ascending. Sessile serrated adenoma. 3 year  surveillance  . COLONOSCOPY N/A 05/19/2017   4X2 cm sessile lesion at hepatic flexdure, unable to be completely removed  . ESOPHAGEAL BANDING N/A 08/29/2015   Procedure: ESOPHAGEAL BANDING;  Surgeon: Daneil Dolin, MD;  [facility];  Service: Endoscopy;  Laterality: N/A;  . ESOPHAGOGASTRODUODENOSCOPY N/A 08/29/2015   Dr. Gala Romney: non-critical Schatzki's ring, somewhat prominent small submucosal vessels without obvious varices. hyperplastic gastric polyp. 2 year surveillance  . HEMORRHOID BANDING    . LAPAROSCOPIC RIGHT COLECTOMY N/A 06/15/2017   Procedure: LAPAROSCOPIC ASSISTED RIGHT COLECTOMY, ERAS PATHWAY;  Surgeon: Fanny Skates, MD;  Location: [facility];  Service: General;  Laterality: N/A;  . POLYPECTOMY  05/19/2017   Procedure: POLYPECTOMY;  Surgeon: Daneil Dolin, MD;  [facility]: [facility];  Service: Endoscopy;;  . SKIN LESION EXCISION       SOCIAL HISTORY:  Social History   Socioeconomic History  . Marital status: Married    Spouse name: Not on file  . Number of children: 2  . Years of education: Not on file  . Highest education level: Not on file  Occupational History  . Occupation: Occupational psychologist: [employer]  . Financial resource strain: Not hard at all  . Food insecurity:    Worry: Never true    Inability: Never true  . Transportation needs:    Medical: No    Non-medical: No  Tobacco Use  . Smoking status: Never Smoker  . Smokeless tobacco: Never Used  Substance and Sexual Activity  . Alcohol use: No  . Drug use: No  . Sexual activity: Yes    Birth control/protection: Post-menopausal  Lifestyle  . Physical activity:    Days per week: 3 days    Minutes per session: 30 min  . Stress: Not at all  Relationships  . Social connections:    Talks on phone: More than three times a week    Gets together: More than three times a week    Attends religious service: More than 4 times per year    Active member of club or  organization: Yes    Attends meetings of clubs or organizations: More than 4 times per year    Relationship status: Married  . Intimate partner violence:    Fear of current or ex partner: No    Emotionally abused: No    Physically abused: No    Forced sexual activity: No  Other Topics Concern  . Not on file  Social History Narrative  . Not on file    FAMILY HISTORY:  Family History  Problem Relation Age of Onset  . Heart attack Mother   . Stroke Mother   . Aneurysm Father   . Colon cancer Neg Hx     CURRENT MEDICATIONS:  Outpatient Encounter Medications as of 06/08/2018  Medication Sig  . CALCIUM-MAGNESIUM PO Take 1 tablet by mouth 2 (two) times a week.   . Cholecalciferol 2000 units TABS Take 6,000 Units by mouth daily.  . clobetasol ointment (TEMOVATE) 0.05 % Use 2-3 x  weekly  . Coenzyme Q10 (COQ10) 100 MG CAPS Take 100 mg by mouth daily.  . cyanocobalamin 500 MCG tablet Take 500 mcg by mouth daily.  . hydrocortisone (ANUSOL-HC) 25 MG suppository Place 1 suppository (25 mg total) rectally every 12 (twelve) hours.  . milk thistle 175 MG tablet Take 175 mg by mouth daily.  . Multiple Vitamins-Minerals (ADULT GUMMY PO) Take 2 tablets by mouth daily.  . Multiple Vitamins-Minerals (HAIR SKIN AND NAILS FORMULA) TABS Take 2 tablets by mouth daily. 2500 mcg per serving  . naproxen sodium (ALEVE) 220 MG tablet Take 220 mg by mouth daily as needed (pain).  . Omega-3 Fatty Acids (FISH OIL) 1200 MG CAPS Take 1,200 mg by mouth daily.  . Selenium 100 MCG TABS Take 100 mcg by mouth daily.   . vitamin C (ASCORBIC ACID) 500 MG tablet Take 500 mg by mouth 2 (two) times a week.   . Wheat Dextrin (BENEFIBER) POWD Take 1 Scoop by mouth daily.    No facility-administered encounter medications on file as of 06/08/2018.     ALLERGIES:  No Known Allergies   PHYSICAL EXAM:  ECOG Performance status: 1  Vitals:   06/08/18 0800  BP: 119/68  Pulse: 95  Resp: 16  Temp: 97.9 F (36.6 C)    SpO2: 100%   Filed Weights   06/08/18 0800  Weight: 143 lb (64.9 kg)    Physical Exam Constitutional:      Appearance: Normal appearance. She is normal weight.  Abdominal:     General: Abdomen is flat.     Palpations: Abdomen is soft.  Musculoskeletal: Normal range of motion.  Skin:    General: Skin is warm and dry.  Neurological:     Mental Status: She is alert and oriented to person, place, and time. Mental status is at baseline.  Psychiatric:        Mood and Affect: Mood normal.        Behavior: Behavior normal.        Thought Content: Thought content normal.        Judgment: Judgment normal.    Chest: Bilateral clear to auscultation CVs: S1-S2 regular rate and rhythm Abdomen: No palpable hepatomegaly or other masses.  LABORATORY DATA:  I have reviewed the labs as listed.  CBC    Component Value Date/Time   WBC 3.4 (L) 06/01/2018 1101   RBC 4.45 06/01/2018 1101   HGB 13.4 06/01/2018 1101   HCT 41.3 06/01/2018 1101   HCT 42 02/25/2012 1033   PLT 128 (L) 06/01/2018 1101   MCV 92.8 06/01/2018 1101   MCH 30.1 06/01/2018 1101   MCHC 32.4 06/01/2018 1101   RDW 12.9 06/01/2018 1101   LYMPHSABS 1.5 06/01/2018 1101   MONOABS 0.3 06/01/2018 1101   EOSABS 0.1 06/01/2018 1101   BASOSABS 0.1 06/01/2018 1101   CMP Latest Ref Rng & Units 06/01/2018 02/16/2018 11/08/2017  Glucose 70 - 99 mg/dL 92 91 106(H)  BUN 8 - 23 mg/dL _0 Creatinine 0.44 - 1.00 mg/dL 0.74 0.79 0.68  Sodium 135 - 145 mmol/L 135 138 140  Potassium 3.5 - 5.1 mmol/L 3.7 3.9 3.7  Chloride 98 - 111 mmol/L 103 103 108  CO2 22 - 32 mmol/L _1 Calcium 8.9 - 10.3 mg/dL 9.5 9.6 9.1  Total Protein 6.5 - 8.1 g/dL 8.3(H) 8.1 7.7  Total Bilirubin 0.3 - 1.2 mg/dL 0.6 0.6 0.4  Alkaline Phos 38 - 126 U/L 62 70 74  AST 15 - 41 U/L _0 ALT 0 - 44 U/L _1 DIAGNOSTIC IMAGING:  I have independently reviewed the scans and discussed with the patient.   I have reviewed Francene Finders,  NP's note and agree with the documentation.  I personally performed a face-to-face visit, made revisions and my assessment and plan is as follows.    ASSESSMENT & PLAN:   Primary cancer of hepatic flexure of colon ([place]) 1.  Stage II (T2N0) hepatic flexure colon adenocarcinoma: -Status post right colon segmental resection on 06/15/2017, 3.2 cm tumor, 0/12 lymph nodes positive.  MMR normal/MSI low -CEA a month after surgery was elevated at 6.3.  CT of the chest did not show any metastatic disease.  -PET/CT scan on 08/16/2017 did not show any evidence of recurrence or metastatic disease.  Her CEA elevation is likely from her liver disease.  She had a history of hepatitis C and was treated for it. -CT scan of the abdomen and pelvis on 02/23/2018 shows postoperative changes of partial right colectomy without definite evidence of local or metastatic disease.  There is mild narrowing in the region of hepatic flexure, presumably at surgical anastomosis. - Today we discussed the results from 06/01/2018.  CEA continues to go high at 8.9.  Prior CEA 3 months ago was 7.7. -She denies any change in bowel habits or bleeding per rectum.  No abdominal pains reported. -I have recommended doing a PET CT scan.  We will see her back after the scan.  2.  Iron deficiency state: -Her ferritin is above 50.  Hemoglobin is 13.4.  She is not on any iron supplements.      Orders placed this encounter:  Orders Placed This Encounter  Procedures  . NM PET Image Restag (PS) Skull Base To Thigh      Derek Jack, MD [place] [phone]

## 2018-06-13 DIAGNOSIS — Z1389 Encounter for screening for other disorder: Secondary | ICD-10-CM | POA: Diagnosis not present

## 2018-06-13 DIAGNOSIS — Z6824 Body mass index (BMI) 24.0-24.9, adult: Secondary | ICD-10-CM | POA: Diagnosis not present

## 2018-06-13 DIAGNOSIS — J22 Unspecified acute lower respiratory infection: Secondary | ICD-10-CM | POA: Diagnosis not present

## 2018-06-13 DIAGNOSIS — J209 Acute bronchitis, unspecified: Secondary | ICD-10-CM | POA: Diagnosis not present

## 2018-06-14 DIAGNOSIS — B351 Tinea unguium: Secondary | ICD-10-CM | POA: Diagnosis not present

## 2018-06-14 DIAGNOSIS — L851 Acquired keratosis [keratoderma] palmaris et plantaris: Secondary | ICD-10-CM | POA: Diagnosis not present

## 2018-06-15 ENCOUNTER — Other Ambulatory Visit (HOSPITAL_COMMUNITY): Payer: Self-pay

## 2018-06-15 ENCOUNTER — Encounter (HOSPITAL_COMMUNITY): Payer: Self-pay

## 2018-06-22 ENCOUNTER — Ambulatory Visit (HOSPITAL_COMMUNITY)
Admission: RE | Admit: 2018-06-22 | Discharge: 2018-06-22 | Disposition: A | Discharge status: Home / Self Care | Payer: Medicare Other | Source: Ambulatory Visit | Attending: Nurse Practitioner | Admitting: Nurse Practitioner

## 2018-06-22 DIAGNOSIS — C183 Malignant neoplasm of hepatic flexure: Secondary | ICD-10-CM | POA: Diagnosis not present

## 2018-06-22 DIAGNOSIS — C189 Malignant neoplasm of colon, unspecified: Secondary | ICD-10-CM | POA: Diagnosis not present

## 2018-06-22 LAB — GLUCOSE, CAPILLARY: Glucose-Capillary: 78 mg/dL (ref 70–99)

## 2018-06-22 MED ORDER — FLUDEOXYGLUCOSE F - 18 (FDG) INJECTION
7.3500 | Freq: Once | INTRAVENOUS | Status: AC | PRN
  Administered 2018-06-22: 7.35 via INTRAVENOUS

## 2018-06-29 ENCOUNTER — Ambulatory Visit (HOSPITAL_COMMUNITY)
Admission: RE | Admit: 2018-06-29 | Discharge: 2018-06-29 | Disposition: A | Discharge status: Home / Self Care | Payer: Medicare Other | Source: Ambulatory Visit | Attending: Family Medicine | Admitting: Family Medicine

## 2018-06-29 DIAGNOSIS — Z780 Asymptomatic menopausal state: Secondary | ICD-10-CM | POA: Diagnosis not present

## 2018-06-29 DIAGNOSIS — E2839 Other primary ovarian failure: Secondary | ICD-10-CM | POA: Insufficient documentation

## 2018-06-29 DIAGNOSIS — M8589 Other specified disorders of bone density and structure, multiple sites: Secondary | ICD-10-CM | POA: Diagnosis not present

## 2018-07-01 ENCOUNTER — Ambulatory Visit: Payer: Self-pay | Admitting: Gastroenterology

## 2018-07-05 ENCOUNTER — Ambulatory Visit (INDEPENDENT_AMBULATORY_CARE_PROVIDER_SITE_OTHER): Payer: Medicare Other | Admitting: Gastroenterology

## 2018-07-05 ENCOUNTER — Encounter: Payer: Self-pay | Admitting: Gastroenterology

## 2018-07-05 ENCOUNTER — Other Ambulatory Visit: Payer: Self-pay

## 2018-07-05 ENCOUNTER — Other Ambulatory Visit (HOSPITAL_COMMUNITY): Payer: Self-pay

## 2018-07-05 ENCOUNTER — Telehealth: Payer: Self-pay

## 2018-07-05 VITALS — BP 117/74 | HR 77 | Temp 97.0°F | Ht 65.5 in | Wt 149.2 lb

## 2018-07-05 DIAGNOSIS — Z8719 Personal history of other diseases of the digestive system: Secondary | ICD-10-CM

## 2018-07-05 DIAGNOSIS — K5900 Constipation, unspecified: Secondary | ICD-10-CM

## 2018-07-05 DIAGNOSIS — D500 Iron deficiency anemia secondary to blood loss (chronic): Secondary | ICD-10-CM

## 2018-07-05 DIAGNOSIS — K7460 Unspecified cirrhosis of liver: Secondary | ICD-10-CM

## 2018-07-05 DIAGNOSIS — C183 Malignant neoplasm of hepatic flexure: Secondary | ICD-10-CM

## 2018-07-05 DIAGNOSIS — Z85038 Personal history of other malignant neoplasm of large intestine: Secondary | ICD-10-CM

## 2018-07-05 MED ORDER — PEG 3350-KCL-NA BICARB-NACL 420 G PO SOLR: 4000.0000 mL | ORAL | 0 refills | Status: DC

## 2018-07-05 NOTE — Patient Instructions (Signed)
1. Colonoscopy and upper endoscopy. See separate instructions.  2. Stop benefiber. Start Miralax one capful twice daily until soft BM, then continue once daily.

## 2018-07-05 NOTE — Assessment & Plan Note (Signed)
History of hepatitis C status post eradication.  Previous MRI/CTs showing shrunken liver with nodular border, normal spleen.  F3/F4 scores on Fibrosure.  Continue serial imaging of the liver.  Last imaging in October outside of recent PET scan.  Labs indicate good hepatic function, INR not obtained so meld score not calculated.  Plan for EGD in the near future to screen for varices.  I have discussed the risks, alternatives, benefits with regards to but not limited to the risk of reaction to medication, bleeding, infection, perforation and the patient is agreeable to proceed. Written consent to be obtained.  We will plan to update labs and liver imaging in the next couple months pending upcoming work-up by oncology for rising CEA level.

## 2018-07-05 NOTE — Assessment & Plan Note (Signed)
Overdue for 1 year surveillance colonoscopy.  I have discussed the risks, alternatives, benefits with regards to but not limited to the risk of reaction to medication, bleeding, infection, perforation and the patient is agreeable to proceed. Written consent to be obtained.

## 2018-07-05 NOTE — Assessment & Plan Note (Signed)
Stop Benefiber.  Begin MiraLAX 1 capful twice daily until soft stool, then daily.

## 2018-07-05 NOTE — Progress Notes (Signed)
CC'D TO PCP °

## 2018-07-05 NOTE — Progress Notes (Signed)
Primary Care Physician:  Sharilyn Sites, MD  Primary Gastroenterologist:  Garfield Cornea, MD   Chief Complaint  Patient presents with  . Rectal Bleeding    about once every 2 weeks when she has a hard BM    HPI:  Ariana Long is a 63 y.o. female here to schedule EGD and colonoscopy.  She has a history of hepatitis C, status post eradication.  Prior MRI in 2015 with nodular liver border, F3/F4 on fibrosure.  EGD in 2017- for esophageal varices.  Due for surveillance EGD at any time.  Diagnosed with stage II colon cancer at the hepatic flexure diagnosed in January 2019, status post right colon segmental resection in February 2019. CEA the month after surgery was elevated at 6.3 CT of the chest did not show any metastatic disease.  PET/CT scan in April 2019 unremarkable.?  CEA elevation from her liver disease.  CT abdomen pelvis October 2019 with postoperative changes of partial right colectomy without definite evidence of local or metastatic disease.  Mild narrowing in the region of the hepatic flexure, presumably at surgical anastomosis.  CEA 8.9 in January.  3 months prior was 7.7.  She had another PET scan February 2020 showing asymmetric tonsillar activity with the left palatine tonsil point physiological.  Anal and perineal activity likely incidental.  No findings of recurrent/metastatic colon cancer.  Patient is due overdue for 1 year follow-up surveillance colonoscopy.    Patient with evidence of early cirrhosis on prior imaging. PET in April 2019 suggestive of shrunken appearance of the liver, nodular contour suggesting cirrhosis.  In 2015 showed shrunken appearance of liver, nodular contour compatible with cirrhosis.  Clinically she feels good.  Has occasional bright red blood per rectum if she passes a hard stool.  Feels like Benefiber causes abdominal pain.  She does not like psyllium fiber.  Denies melena.  No unintentional weight loss.  No heartburn, vomiting, dysphagia.  Labs on  June 01, 2018 with normal LFTs, albumin 4.5, CBC with normal hemoglobin of 13.4, platelets slightly depressed at 128,000.  Platelet counts normal back in October.  Her recent INR to check mailed.  Last INR 1 year ago was normal at 1.02.  Labs indicate good hepatic function.  Last liver imaging in October with no hepatoma.   Current Outpatient Medications  Medication Sig Dispense Refill  . CALCIUM-MAGNESIUM PO Take 1 tablet by mouth daily.     . Cholecalciferol 2000 units TABS Take 6,000 Units by mouth daily.    . clobetasol ointment (TEMOVATE) 0.05 % Use 2-3 x weekly 30 g 2  . Coenzyme Q10 (COQ10) 100 MG CAPS Take 100 mg by mouth daily.    . cyanocobalamin 500 MCG tablet Take 500 mcg by mouth daily.    . hydrocortisone (ANUSOL-HC) 25 MG suppository Place 1 suppository (25 mg total) rectally every 12 (twelve) hours. (Patient taking differently: Place 25 mg rectally as needed. ) 12 suppository 1  . milk thistle 175 MG tablet Take 175 mg by mouth daily.    . Multiple Vitamins-Minerals (ADULT GUMMY PO) Take 2 tablets by mouth daily.    . Multiple Vitamins-Minerals (HAIR SKIN AND NAILS FORMULA) TABS Take 2 tablets by mouth daily. 2500 mcg per serving    . naproxen sodium (ALEVE) 220 MG tablet Take 220 mg by mouth daily as needed (pain).    . Omega-3 Fatty Acids (FISH OIL) 1200 MG CAPS Take 1,200 mg by mouth daily.    Marland Kitchen OVER THE COUNTER MEDICATION Herbs  once daily    . Selenium 100 MCG TABS Take 100 mcg by mouth daily.     . vitamin C (ASCORBIC ACID) 500 MG tablet Take 500 mg by mouth daily.     . Wheat Dextrin (BENEFIBER) POWD Take 1 Scoop by mouth daily.      No current facility-administered medications for this visit.     Allergies as of 07/05/2018  . (No Known Allergies)    Past Medical History:  Diagnosis Date  . Cirrhosis of liver ([place]) 01/26/2011   FIBROSIS on liver biopsy in 2007, U/S  03/31/2013= mild diffuse hepatic steatosis and /or hepatocellular disease without focal hepatic  parenchymal abnormality. per Dr. Patsy Baltimore (2012) pt is immune to hep A and Hep B. per 12/06/13 note from Liver clinic-pt had MRI which showed cirrhosis 2015.  . Diverticulitis of colon   . Elevated AFP 01/26/2011  . Gall stones   . Hematuria 12/12/2014  . Hemorrhoid 12/01/2012  . Hemorrhoids   . Hepatitis C 01/26/2011   2005 non responder, genotype 1, Gr 1-2, Stage 3 . Treated with Harvoni at the Liver clinic, responder  . Hidradenitis   . History of hiatal hernia   . Hx: UTI (urinary tract infection)   . Kidney stones 10/2011  . Osteoporosis   . Primary cancer of hepatic flexure of colon ([place]) 06/15/2017  . Serrated adenoma of colon 11/2011  . Splenomegaly 01/26/2011  . Thrombocytopenia ([place]) 01/26/2011  . Tubular adenoma   . Vulval lesion 12/14/2013   Has kissing lesions thin and puffy and paler i color about 5-6- mm in diameter.Dr Glo Herring in to co examine will try temovate and recheck in 3 months    Past Surgical History:  Procedure Laterality Date  . AXILLARY SURGERY Bilateral 1979   "glands removed"  . BREAST BIOPSY Right 2011  . BREAST EXCISIONAL BIOPSY Bilateral 25 years ago   Benign, fatty tumors  . CESAREAN SECTION     x 2  . COLECTOMY  06/15/2017   LAPAROSCOPIC ASSISTED RIGHT COLECTOMY, ERAS PATHWAY/notes 06/15/2017  . COLONOSCOPY  07/31/2005   [place]-Normal rectum/Normal colonoscopy/Repeat screening colonoscopy ten years  . COLONOSCOPY  11/16/2011   RMR: Friable anorectum-likely source of hematochezia/ LARGE 1.2x2.5CM SERRATED ADENOMA resected from ascending colon 7CM DISTAL TO ICV, hyperplastic polyp removed   . COLONOSCOPY N/A 07/18/2012   Dr. Gala Romney- normal appearing rectal mucosa. no residual polpy tissue seen at tatoo location. remainder of colonic mucosa appeared entirely normal. bx = tubular adenoma  . COLONOSCOPY N/A 08/29/2015   Dr. Gala Romney: single 12 mm polyp in ascending colon, 4 mm polyp ascending. Sessile serrated adenoma. 3 year surveillance  . COLONOSCOPY N/A 05/19/2017    4X2 cm sessile lesion at hepatic flexdure, unable to be completely removed  . ESOPHAGEAL BANDING N/A 08/29/2015   Procedure: ESOPHAGEAL BANDING;  Surgeon: Daneil Dolin, MD;  [facility];  Service: Endoscopy;  Laterality: N/A;  . ESOPHAGOGASTRODUODENOSCOPY N/A 08/29/2015   Dr. Gala Romney: non-critical Schatzki's ring, somewhat prominent small submucosal vessels without obvious varices. hyperplastic gastric polyp. 2 year surveillance  . HEMORRHOID BANDING    . LAPAROSCOPIC RIGHT COLECTOMY N/A 06/15/2017   Procedure: LAPAROSCOPIC ASSISTED RIGHT COLECTOMY, ERAS PATHWAY;  Surgeon: Fanny Skates, MD;  Location: [facility];  Service: General;  Laterality: N/A;  . POLYPECTOMY  05/19/2017   Procedure: POLYPECTOMY;  Surgeon: Daneil Dolin, MD;  Location: [facility];  Service: Endoscopy;;  . SKIN LESION EXCISION      Family History  Problem Relation Age of Onset  . Heart attack Mother   . Stroke Mother   . Aneurysm Father   . Colon cancer Neg Hx     Social History   Socioeconomic History  . Marital status: Married    Spouse name: Not on file  . Number of children: 2  . Years of education: Not on file  . Highest education level: Not on file  Occupational History  . Occupation: Occupational psychologist: [employer]  . Financial resource strain: Not hard at all  . Food insecurity:    Worry: Never true    Inability: Never true  . Transportation needs:    Medical: No    Non-medical: No  Tobacco Use  . Smoking status: Never Smoker  . Smokeless tobacco: Never Used  Substance and Sexual Activity  . Alcohol use: No  . Drug use: No  . Sexual activity: Yes    Birth control/protection: Post-menopausal  Lifestyle  . Physical activity:    Days per week: 3 days    Minutes per session: 30 min  . Stress: Not at all  Relationships  . Social connections:    Talks on phone: More than three times a week    Gets together: More than three times a week    Attends  religious service: More than 4 times per year    Active member of club or organization: Yes    Attends meetings of clubs or organizations: More than 4 times per year    Relationship status: Married  . Intimate partner violence:    Fear of current or ex partner: No    Emotionally abused: No    Physically abused: No    Forced sexual activity: No  Other Topics Concern  . Not on file  Social History Narrative  . Not on file      ROS:  General: Negative for anorexia, weight loss, fever, chills, fatigue, weakness. Eyes: Negative for vision changes.  ENT: Negative for hoarseness, difficulty swallowing , nasal congestion. CV: Negative for chest pain, angina, palpitations, dyspnea on exertion, peripheral edema.  Respiratory: Negative for dyspnea at rest, dyspnea on exertion, cough, sputum, wheezing.  GI: See history of present illness. GU:  Negative for dysuria, hematuria, urinary incontinence, urinary frequency, nocturnal urination.  MS: Negative for joint pain, low back pain.  Derm: Negative for rash or itching.  Neuro: Negative for weakness, abnormal sensation, seizure, frequent headaches, memory loss, confusion.  Psych: Negative for anxiety, depression, suicidal ideation, hallucinations.  Endo: Negative for unusual weight change.  Heme: Negative for bruising or bleeding. Allergy: Negative for rash or hives.    Physical Examination:  BP 117/74   Pulse 77   Temp (!) 97 F (36.1 C) (Oral)   Ht 5' 5.5" (1.664 m)   Wt 149 lb 4 oz (67.7 kg)   BMI 24.46 kg/m    General: Well-nourished, well-developed in no acute distress.  Head: Normocephalic, atraumatic.   Eyes: Conjunctiva pink, no icterus. Mouth: Oropharyngeal mucosa moist and pink , no lesions erythema or exudate. Neck: Supple without thyromegaly, masses, or lymphadenopathy.  Lungs: Clear to auscultation bilaterally.  Heart: Regular rate and rhythm, no murmurs rubs or gallops.  Abdomen: Bowel sounds are normal, nontender,  nondistended, no hepatosplenomegaly or masses, no abdominal bruits or    hernia , no rebound or guarding.   Rectal: Not performed Extremities: No lower extremity edema. No clubbing or deformities.  Neuro: Alert and oriented  x 4 , grossly normal neurologically.  Skin: Warm and dry, no rash or jaundice.   Psych: Alert and cooperative, normal mood and affect.  Labs: Lab Results  Component Value Date   CREATININE 0.74 06/01/2018   BUN 10 06/01/2018   NA 135 06/01/2018   K 3.7 06/01/2018   CL 103 06/01/2018   CO2 25 06/01/2018   Lab Results  Component Value Date   WBC 3.4 (L) 06/01/2018   HGB 13.4 06/01/2018   HCT 41.3 06/01/2018   MCV 92.8 06/01/2018   PLT 128 (L) 06/01/2018   Lab Results  Component Value Date   ALT 22 06/01/2018   AST 26 06/01/2018   ALKPHOS 62 06/01/2018   BILITOT 0.6 06/01/2018   Lab Results  Component Value Date   IRON 80 06/01/2018   TIBC 331 06/01/2018   FERRITIN 56 06/01/2018     cea 8.9 up from 7/7  Imaging Studies: Nm Pet Image Restag (ps) Skull Base To Thigh  Result Date: 06/22/2018 CLINICAL DATA:  Subsequently treatment strategy for colon cancer. EXAM: NUCLEAR MEDICINE PET SKULL BASE TO THIGH TECHNIQUE: 7.4 mCi F-18 FDG was injected intravenously. Full-ring PET imaging was performed from the skull base to thigh after the radiotracer. CT data was obtained and used for attenuation correction and anatomic localization. Fasting blood glucose: 78 mg/dl COMPARISON:  Multiple exams, including 08/16/2017 and interval CT scan of 02/23/2018 FINDINGS: Mediastinal blood pool activity: SUV max 2.3 NECK: Asymmetric tonsillar activity with the left palatine tonsil with maximum SUV 10.2 and the right with maximum SUV 5.9. No definite CT correlate. Likely physiologic activity at the tongue base. No hypermetabolic or pathologically enlarged adenopathy in the neck. Incidental CT findings: Reduced volume of the left mandibular condyles probably from TMJ arthropathy.  CHEST: No significant abnormal hypermetabolic activity in this region. Incidental CT findings: Atherosclerotic calcification of the aortic arch. ABDOMEN/PELVIS: Anal activity and perineal activity, likely incidental. No findings of liver lesion, hypermetabolic adenopathy, or other indicators of recurrent malignancy. Incidental CT findings: Bilateral nonobstructive nephrolithiasis. 5 mm hyperdense lesion of the left mid kidney laterally on image 102/4 is likely a small complex cyst. Postoperative findings in the colon. Mild fatty prominence of the lower omentum without accentuated metabolic activity. SKELETON: No significant abnormal hypermetabolic activity in this region. Incidental CT findings: none IMPRESSION: 1. No findings of recurrent/metastatic colon cancer. 2. Asymmetric palatine tonsillar activity, left greater than right. This is most commonly physiologic and there is no CT correlate. Visualization of the tonsils may be warranted to exclude an underlying lesion. 3.  Aortic Atherosclerosis (ICD10-I70.0). 4. Nonobstructive nephrolithiasis. Electronically Signed   By: Van Clines M.D.   On: 06/22/2018 15:22   Dg Bone Density (dxa)  Result Date: 06/29/2018 EXAM: DUAL X-RAY ABSORPTIOMETRY (DXA) FOR BONE MINERAL DENSITY IMPRESSION: Ordering Physician:  Dr. Sharilyn Sites, Your patient Ariana Long completed a BMD test on 06/29/2018 using the [place] (software version: 14.10) manufactured by [employer]. The following summarizes the results of our evaluation. Technologist:[facility] PATIENT BIOGRAPHICAL: Name: Ariana Long, Ariana Long Patient ID: [id number] Birth Date: June 11, 1955 Height: 65.5 in. Gender: Female Exam Date: 06/29/2018 Weight: 145.0 lbs. Indications: Height Loss, Follow up Osteopenia, Post Menopausal Fractures: Treatments: Vitamin D, Calcium DENSITOMETRY RESULTS: Site      Region     Measured Date Measured Age WHO Classification Young Adult T-score BMD         %Change vs.  Previous Significant Change (*) [facility] Spine L2-L3 06/29/2018 65.1 Osteopenia -  2.2 0.932 g/cm2 -5.3% - AP Spine L2-L3 03/14/2014 60.8 Osteopenia -1.8 0.984 g/cm2 5.7% - AP Spine L2-L3 02/20/2011 57.7 Osteopenia -2.2 0.931 g/cm2 -9.4% Yes AP Spine L2-L3 01/25/2009 55.7 Osteopenia -1.4 1.028 g/cm2 -4.1% - AP Spine L2-L3 01/05/2003 49.6 Osteopenia -1.1 1.072 g/cm2 - - DualFemur Neck Left 06/29/2018 65.1 Osteopenia -1.9 0.778 g/cm2 -1.5% - DualFemur Neck Left 03/14/2014 60.8 Osteopenia -1.8 0.790 g/cm2 -1.3% - DualFemur Neck Left 02/20/2011 57.7 Osteopenia -1.7 0.800 g/cm2 -1.2% - DualFemur Neck Left 01/25/2009 55.7 Osteopenia -1.6 0.810 g/cm2 -3.8% - DualFemur Neck Left 01/05/2003 49.6 Osteopenia -1.4 0.842 g/cm2 - - DualFemur Total Mean 06/29/2018 65.1 Osteopenia -1.8 0.784 g/cm2 -3.2% Yes DualFemur Total Mean 03/14/2014 60.8 Osteopenia -1.6 0.810 g/cm2 2.0% - DualFemur Total Mean 02/20/2011 57.7 Osteopenia -1.7 0.794 g/cm2 -1.7% - DualFemur Total Mean 01/25/2009 55.7 Osteopenia -1.6 0.808 g/cm2 -2.8% Yes DualFemur Total Mean 01/05/2003 49.6 Osteopenia -1.4 0.831 g/cm2 - - ASSESSMENT: BMD as determined from [facility] Spine L2-L3 is 0.932 g/cm2 with a T-Score of -2.2. This patient is considered osteopenic according to [facility]) criteria.The scan quality is good. Compared with the prior study on 03/14/2014, the BMD of the total mean shows a statistically significant decrease. L-1 and L-4 were excluded due to advanced degenerative changes. World Pharmacologist [facility]) criteria for post-menopausal, Caucasian Women: Normal:       T-score at or above -1 SD Osteopenia:   T-score between -1 and -2.5 SD Osteoporosis: T-score at or below -2.5 SD RECOMMENDATIONS: 1. All patients should optimize calcium and vitamin D intake. 2. Consider FDA-approved medical therapies in postmenopausal women and med aged 38 years and older, based on the following: a. A hip or vertebral (clinical or morphometric) fracture b. T-score<  -2.5 at the femoral neck or spine after appropriate evaluation to exclude secondary causes c. Low bone mass (T-score between -1.0 and -2.5 at the femoral neck or spine) and a 10-year probability of a hip fracture > 3% or a 10-year probability of a major osteoporosis-related fracture > 20% based on the [place]-adapted WHO algorithm d. Clinician judgment and/or patient preferences may indicate treatment for people with 10-year fracture probabilities above or below these levels FOLLOW-UP: Patients with diagnosis of osteoporosis or at high risk fracture should have regular bone mineral density tests. For patients eligible for Medicare, routine testing is allowed once every 2 years. Testing frequency can be increased to on year for patients who have rapidly progressing disease, those who are receiving medical therapy to restore bone mass, or have additional risk factors. I have reviewed this report, and agree with the above findings. [facility] Your patient Ariana Long completed a FRAX assessment on 06/29/2018 using the [employer] (analysis version: 14.10) manufactured by [employer]. The following summarizes the results of our evaluation. PATIENT BIOGRAPHICAL: Name: Ariana Long, Ariana Long Patient ID: [id number] Birth Date: April 28, 1956 Height:    65.5 in. Gender:     Female    Age:        63.1       Weight:    145.0 lbs. Ethnicity:  Black                            Exam Date: 06/29/2018 FRAX* RESULTS:  (version: 3.5) 10-year Probability of Fracture1 Major Osteoporotic Fracture2 Hip Fracture 4.5% 0.6% Population: [place] (Black) Risk Factors: None Based on Femur (Left) Neck BMD 1 -The 10-year probability of fracture may be lower  than reported if the patient has received treatment. 2 -Major Osteoporotic Fracture: Clinical Spine, Forearm, Hip or Shoulder *FRAX is a Materials engineer of the [employer] for Metabolic Bone Disease, a [place] (WHO) [employer]. ASSESSMENT: The probability of a major osteoporotic fracture is 4.5% within the next ten years. The probability of a hip fracture is 0.6% within the next ten years. Electronically Signed   By: Ilona Sorrel M.D.   On: 06/29/2018 11:40

## 2018-07-05 NOTE — Telephone Encounter (Signed)
Called pt, TCS/EGD with RMR moved up to 08/05/18 d/t a cancellation. New instructions mailed. LMOVM for endo scheduler.  FYI to LSL, she was scheduled for procedure 09/06/18.

## 2018-07-06 ENCOUNTER — Inpatient Hospital Stay (HOSPITAL_COMMUNITY): Payer: Medicare Other

## 2018-07-06 ENCOUNTER — Inpatient Hospital Stay (HOSPITAL_COMMUNITY): Payer: Medicare Other | Attending: Hematology | Admitting: Hematology

## 2018-07-06 ENCOUNTER — Other Ambulatory Visit: Payer: Self-pay

## 2018-07-06 ENCOUNTER — Encounter (HOSPITAL_COMMUNITY): Payer: Self-pay | Admitting: Hematology

## 2018-07-06 DIAGNOSIS — K7460 Unspecified cirrhosis of liver: Secondary | ICD-10-CM | POA: Insufficient documentation

## 2018-07-06 DIAGNOSIS — M818 Other osteoporosis without current pathological fracture: Secondary | ICD-10-CM | POA: Insufficient documentation

## 2018-07-06 DIAGNOSIS — D500 Iron deficiency anemia secondary to blood loss (chronic): Secondary | ICD-10-CM

## 2018-07-06 DIAGNOSIS — C183 Malignant neoplasm of hepatic flexure: Secondary | ICD-10-CM

## 2018-07-06 LAB — COMPREHENSIVE METABOLIC PANEL
ALK PHOS: 76 U/L (ref 38–126)
ALT: 17 U/L (ref 0–44)
AST: 22 U/L (ref 15–41)
Albumin: 4.3 g/dL (ref 3.5–5.0)
Anion gap: 8 (ref 5–15)
BUN: 19 mg/dL (ref 8–23)
CALCIUM: 9.3 mg/dL (ref 8.9–10.3)
CO2: 23 mmol/L (ref 22–32)
CREATININE: 0.73 mg/dL (ref 0.44–1.00)
Chloride: 110 mmol/L (ref 98–111)
GFR calc Af Amer: >60 mL/min (ref >60)
GFR calc non Af Amer: >60 mL/min (ref >60)
Glucose, Bld: 95 mg/dL (ref 70–99)
Potassium: 3.6 mmol/L (ref 3.5–5.1)
Sodium: 141 mmol/L (ref 135–145)
TOTAL PROTEIN: 7.9 g/dL (ref 6.5–8.1)
Total Bilirubin: 0.6 mg/dL (ref 0.3–1.2)

## 2018-07-06 LAB — CBC WITH DIFFERENTIAL/PLATELET
Abs Immature Granulocytes: 0.01 10*3/uL (ref 0.00–0.07)
Basophils Absolute: 0.1 10*3/uL (ref 0.0–0.1)
Basophils Relative: 1 %
Eosinophils Absolute: 0.1 10*3/uL (ref 0.0–0.5)
Eosinophils Relative: 2 %
HCT: 40.5 % (ref 36.0–46.0)
Hemoglobin: 13.3 g/dL (ref 12.0–15.0)
Immature Granulocytes: 0 %
LYMPHS PCT: 35 %
Lymphs Abs: 1.8 10*3/uL (ref 0.7–4.0)
MCH: 31 pg (ref 26.0–34.0)
MCHC: 32.8 g/dL (ref 30.0–36.0)
MCV: 94.4 fL (ref 80.0–100.0)
Monocytes Absolute: 0.6 10*3/uL (ref 0.1–1.0)
Monocytes Relative: 12 %
Neutro Abs: 2.5 10*3/uL (ref 1.7–7.7)
Neutrophils Relative %: 50 %
Platelets: 126 10*3/uL — ABNORMAL LOW (ref 150–400)
RBC: 4.29 MIL/uL (ref 3.87–5.11)
RDW: 13.5 % (ref 11.5–15.5)
WBC: 5.1 10*3/uL (ref 4.0–10.5)
nRBC: 0 % (ref 0.0–0.2)

## 2018-07-06 NOTE — Patient Instructions (Addendum)
[facility] Discharge Instructions   You were seen today by Dr. Delton Coombes, he went over your recent results and your labs are all stable. We will see you back in 3 months for labs and follow up.    Thank you for choosing [facility] to provide your oncology and hematology care.  To afford each patient quality time with our provider, please arrive at least 15 minutes before your scheduled appointment time.   If you have a lab appointment with the [facility] please come in thru the  Main Entrance and check in at the main information desk  You need to re-schedule your appointment should you arrive 10 or more minutes late.  We strive to give you quality time with our providers, and arriving late affects you and other patients whose appointments are after yours.  Also, if you no show three or more times for appointments you may be dismissed from the clinic at the providers discretion.     Again, thank you for choosing [facility].  Our hope is that these requests will decrease the amount of time that you wait before being seen by our physicians.       _____________________________________________________________  Should you have questions after your visit to [facility], please contact our office at [phone] between the hours of 8:00 a.m. and 4:30 p.m.  Voicemails left after 4:00 p.m. will not be returned until the following business day.  For prescription refill requests, have your pharmacy contact our office and allow 72 hours.    Cancer Center Support Programs:   > Cancer Support Group  2nd Tuesday of the month 1pm-2pm, Journey Room

## 2018-07-06 NOTE — Telephone Encounter (Signed)
Noted. Thanks.

## 2018-07-06 NOTE — Progress Notes (Signed)
[place] [place], [place] [address]   CLINIC:  Medical Oncology/Hematology  PCP:  Sharilyn Sites, [place] [address] [phone]   REASON FOR VISIT: Follow-up for stage II colon cancer  CURRENT THERAPY:observation  BRIEF ONCOLOGIC HISTORY:    Primary cancer of hepatic flexure of colon ([place])   06/15/2017 Initial Diagnosis    Primary cancer of hepatic flexure of colon ([place])    06/15/2017 Cancer Staging    Staging form: Colon and Rectum - Neuroendocine Tumors, AJCC 8th Edition - Clinical stage from 06/15/2017: Stage IIA (cT2, cN0, cM0) - Signed by Derek Jack, MD on 08/18/2017      CANCER STAGING: Cancer Staging Primary cancer of hepatic flexure of colon ([place]) Staging form: Colon and Rectum - Neuroendocine Tumors, AJCC 8th Edition - Clinical stage from 06/15/2017: Stage IIA (cT2, cN0, cM0) - Signed by Derek Jack, MD on 08/18/2017    INTERVAL HISTORY:  Ms. Dehaan 63 y.o. female returns for routine follow-up for stage II colon cancer. She has been doing well since her last visit. She does experience constipation at times. Otherwise she has no complaints. Denies any nausea, vomiting, or diarrhea. Denies any new pains. Had not noticed any recent bleeding such as epistaxis, hematuria or hematochezia. Denies recent chest pain on exertion, shortness of breath on minimal exertion, pre-syncopal episodes, or palpitations. Denies any numbness or tingling in hands or feet. Denies any recent fevers, infections, or recent hospitalizations. Patient reports appetite at 100% and energy level at 100%. She is eating well and maintaining her well.     REVIEW OF SYSTEMS:  Review of Systems  Gastrointestinal: Positive for constipation.  All other systems reviewed and are negative.    PAST MEDICAL/SURGICAL HISTORY:  Past Medical History:  Diagnosis Date  . Cirrhosis of liver ([facility]) 01/26/2011   FIBROSIS on liver  biopsy in 2007, U/S  03/31/2013= mild diffuse hepatic steatosis and /or hepatocellular disease without focal hepatic parenchymal abnormality. per Dr. Patsy Baltimore (2012) pt is immune to hep A and Hep B. per 12/06/13 note from Liver clinic-pt had MRI which showed cirrhosis 2015.  . Diverticulitis of colon   . Elevated AFP 01/26/2011  . Gall stones   . Hematuria 12/12/2014  . Hemorrhoid 12/01/2012  . Hemorrhoids   . Hepatitis C 01/26/2011   2005 non responder, genotype 1, Gr 1-2, Stage 3 . Treated with Harvoni at the Liver clinic, responder  . Hidradenitis   . History of hiatal hernia   . Hx: UTI (urinary tract infection)   . Kidney stones 10/2011  . Osteoporosis   . Primary cancer of hepatic flexure of colon ([facility]) 06/15/2017  . Serrated adenoma of colon 11/2011  . Splenomegaly 01/26/2011  . Thrombocytopenia ([facility]) 01/26/2011  . Tubular adenoma   . Vulval lesion 12/14/2013   Has kissing lesions thin and puffy and paler i color about 5-6- mm in diameter.Dr Glo Herring in to co examine will try temovate and recheck in 3 months   Past Surgical History:  Procedure Laterality Date  . AXILLARY SURGERY Bilateral 1979   "glands removed"  . BREAST BIOPSY Right 2011  . BREAST EXCISIONAL BIOPSY Bilateral 25 years ago   Benign, fatty tumors  . CESAREAN SECTION     x 2  . COLECTOMY  06/15/2017   LAPAROSCOPIC ASSISTED RIGHT COLECTOMY, ERAS PATHWAY/notes 06/15/2017  . COLONOSCOPY  07/31/2005   [facility]-Normal rectum/Normal colonoscopy/Repeat screening colonoscopy ten years  . COLONOSCOPY  11/16/2011   RMR:  Friable anorectum-likely source of hematochezia/ LARGE 1.2x2.5CM SERRATED ADENOMA resected from ascending colon 7CM DISTAL TO ICV, hyperplastic polyp removed   . COLONOSCOPY N/A 07/18/2012   Dr. Gala Romney- normal appearing rectal mucosa. no residual polpy tissue seen at tatoo location. remainder of colonic mucosa appeared entirely normal. bx = tubular adenoma  . COLONOSCOPY N/A 08/29/2015   Dr. Gala Romney: single 12 mm polyp in  ascending colon, 4 mm polyp ascending. Sessile serrated adenoma. 3 year surveillance  . COLONOSCOPY N/A 05/19/2017   4X2 cm sessile lesion at hepatic flexdure, unable to be completely removed  . ESOPHAGEAL BANDING N/A 08/29/2015   Procedure: ESOPHAGEAL BANDING;  Surgeon: Daneil Dolin, MD;  [facility];  Service: Endoscopy;  Laterality: N/A;  . ESOPHAGOGASTRODUODENOSCOPY N/A 08/29/2015   Dr. Gala Romney: non-critical Schatzki's ring, somewhat prominent small submucosal vessels without obvious varices. hyperplastic gastric polyp. 2 year surveillance  . HEMORRHOID BANDING    . LAPAROSCOPIC RIGHT COLECTOMY N/A 06/15/2017   Procedure: LAPAROSCOPIC ASSISTED RIGHT COLECTOMY, ERAS PATHWAY;  Surgeon: Fanny Skates, MD;  Location: [facility];  Service: General;  Laterality: N/A;  . POLYPECTOMY  05/19/2017   Procedure: POLYPECTOMY;  Surgeon: Daneil Dolin, MD;  Location: [facility];  Service: Endoscopy;;  . SKIN LESION EXCISION       SOCIAL HISTORY:  Social History   Socioeconomic History  . Marital status: Married    Spouse name: Not on file  . Number of children: 2  . Years of education: Not on file  . Highest education level: Not on file  Occupational History  . Occupation: Occupational psychologist: [employer]  . Financial resource strain: Not hard at all  . Food insecurity:    Worry: Never true    Inability: Never true  . Transportation needs:    Medical: No    Non-medical: No  Tobacco Use  . Smoking status: Never Smoker  . Smokeless tobacco: Never Used  Substance and Sexual Activity  . Alcohol use: No  . Drug use: No  . Sexual activity: Yes    Birth control/protection: Post-menopausal  Lifestyle  . Physical activity:    Days per week: 3 days    Minutes per session: 30 min  . Stress: Not at all  Relationships  . Social connections:    Talks on phone: More than three times a week    Gets together: More than three times a week    Attends religious  service: More than 4 times per year    Active member of club or organization: Yes    Attends meetings of clubs or organizations: More than 4 times per year    Relationship status: Married  . Intimate partner violence:    Fear of current or ex partner: No    Emotionally abused: No    Physically abused: No    Forced sexual activity: No  Other Topics Concern  . Not on file  Social History Narrative  . Not on file    FAMILY HISTORY:  Family History  Problem Relation Age of Onset  . Heart attack Mother   . Stroke Mother   . Aneurysm Father   . Colon cancer Neg Hx     CURRENT MEDICATIONS:  Outpatient Encounter Medications as of 07/06/2018  Medication Sig  . CALCIUM-MAGNESIUM PO Take 1 tablet by mouth daily.   . Cholecalciferol 2000 units TABS Take 6,000 Units by mouth daily.  . clobetasol ointment (TEMOVATE) 0.05 %  Use 2-3 x weekly  . Coenzyme Q10 (COQ10) 100 MG CAPS Take 100 mg by mouth daily.  . cyanocobalamin 500 MCG tablet Take 500 mcg by mouth daily.  . hydrocortisone (ANUSOL-HC) 25 MG suppository Place 1 suppository (25 mg total) rectally every 12 (twelve) hours. (Patient taking differently: Place 25 mg rectally as needed. )  . milk thistle 175 MG tablet Take 175 mg by mouth daily.  . Multiple Vitamins-Minerals (ADULT GUMMY PO) Take 2 tablets by mouth daily.  . Multiple Vitamins-Minerals (HAIR SKIN AND NAILS FORMULA) TABS Take 2 tablets by mouth daily. 2500 mcg per serving  . naproxen sodium (ALEVE) 220 MG tablet Take 220 mg by mouth daily as needed (pain).  . Omega-3 Fatty Acids (FISH OIL) 1200 MG CAPS Take 1,200 mg by mouth daily.  Marland Kitchen OVER THE COUNTER MEDICATION Herbs once daily  . polyethylene glycol-electrolytes (TRILYTE) 420 g solution Take 4,000 mLs by mouth as directed.  . Selenium 100 MCG TABS Take 100 mcg by mouth daily.   . vitamin C (ASCORBIC ACID) 500 MG tablet Take 500 mg by mouth daily.   . Wheat Dextrin (BENEFIBER) POWD Take 1 Scoop by mouth daily.    No  facility-administered encounter medications on file as of 07/06/2018.     ALLERGIES:  No Known Allergies   PHYSICAL EXAM:  ECOG Performance status: 1  Vitals:   07/06/18 1025  BP: 114/75  Pulse: (!) 58  Resp: 18  Temp: (!) 97.5 F (36.4 C)  SpO2: 100%   Filed Weights   07/06/18 1025  Weight: 149 lb 4.8 oz (67.7 kg)    Physical Exam Constitutional:      Appearance: Normal appearance. She is normal weight.  Cardiovascular:     Rate and Rhythm: Normal rate and regular rhythm.     Heart sounds: Normal heart sounds.  Pulmonary:     Effort: Pulmonary effort is normal.     Breath sounds: Normal breath sounds.  Musculoskeletal: Normal range of motion.  Skin:    General: Skin is warm and dry.  Neurological:     Mental Status: She is alert and oriented to person, place, and time. Mental status is at baseline.  Psychiatric:        Mood and Affect: Mood normal.        Behavior: Behavior normal.        Thought Content: Thought content normal.        Judgment: Judgment normal.      LABORATORY DATA:  I have reviewed the labs as listed.  CBC    Component Value Date/Time   WBC 5.1 07/06/2018 0946   RBC 4.29 07/06/2018 0946   HGB 13.3 07/06/2018 0946   HCT 40.5 07/06/2018 0946   HCT 42 02/25/2012 1033   PLT 126 (L) 07/06/2018 0946   MCV 94.4 07/06/2018 0946   MCH 31.0 07/06/2018 0946   MCHC 32.8 07/06/2018 0946   RDW 13.5 07/06/2018 0946   LYMPHSABS 1.8 07/06/2018 0946   MONOABS 0.6 07/06/2018 0946   EOSABS 0.1 07/06/2018 0946   BASOSABS 0.1 07/06/2018 0946   CMP Latest Ref Rng & Units 07/06/2018 06/01/2018 02/16/2018  Glucose 70 - 99 mg/dL 95 92 91  BUN 8 - 23 mg/dL '19 10 15  ' Creatinine 0.44 - 1.00 mg/dL 0.73 0.74 0.79  Sodium 135 - 145 mmol/L 141 135 138  Potassium 3.5 - 5.1 mmol/L 3.6 3.7 3.9  Chloride 98 - 111 mmol/L 110 103 103  CO2 22 - 32  mmol/L '23 25 26  ' Calcium 8.9 - 10.3 mg/dL 9.3 9.5 9.6  Total Protein 6.5 - 8.1 g/dL 7.9 8.3(H) 8.1  Total Bilirubin 0.3  - 1.2 mg/dL 0.6 0.6 0.6  Alkaline Phos 38 - 126 U/L 76 62 70  AST 15 - 41 U/L '22 26 27  ' ALT 0 - 44 U/L '17 22 24       ' DIAGNOSTIC IMAGING:  I have independently reviewed the scans and discussed with the patient.   I have reviewed Francene Finders, NP's note and agree with the documentation.  I personally performed a face-to-face visit, made revisions and my assessment and plan is as follows.    ASSESSMENT & PLAN:   Primary cancer of hepatic flexure of colon ([facility]) 1.  Stage II (T2N0) hepatic flexure colon adenocarcinoma: -Status post right colon segmental resection on 06/15/2017, 3.2 cm tumor, 0/12 lymph nodes positive.  MMR normal/MSI low -CEA a month after surgery was elevated at 6.3.  CT of the chest did not show any metastatic disease.  -PET/CT scan on 08/16/2017 did not show any evidence of recurrence or metastatic disease.  Her CEA elevation is likely from her liver disease.  She had a history of hepatitis C and was treated for it. -CT scan of the abdomen and pelvis on 02/23/2018 shows postoperative changes of partial right colectomy without definite evidence of local or metastatic disease.  There is mild narrowing in the region of hepatic flexure, presumably at surgical anastomosis. - CEA on 06/01/2018 has increased to 8.9.  Prior CEA 3 months ago was 7.7. - PET CT scan on 06/22/2018 shows no findings of recurrent/metastatic colon cancer.  Asymmetric palatine tonsillar activity, left greater than right. - Most likely reason for her elevated CEA is hep C liver disease.  We will plan to see her back in 3 months with repeat CEA level.  I plan to repeat CT scan in 6 months.  2.  Iron deficiency state: -Her last ferritin is above 50.  Hemoglobin today is 13.3. -She does not require any iron supplements.      Orders placed this encounter:  No orders of the defined types were placed in this encounter.     Derek Jack, MD [place] [phone]

## 2018-07-06 NOTE — Assessment & Plan Note (Signed)
1.  Stage II (T2N0) hepatic flexure colon adenocarcinoma: -Status post right colon segmental resection on 06/15/2017, 3.2 cm tumor, 0/12 lymph nodes positive.  MMR normal/MSI low -CEA a month after surgery was elevated at 6.3.  CT of the chest did not show any metastatic disease.  -PET/CT scan on 08/16/2017 did not show any evidence of recurrence or metastatic disease.  Her CEA elevation is likely from her liver disease.  She had a history of hepatitis C and was treated for it. -CT scan of the abdomen and pelvis on 02/23/2018 shows postoperative changes of partial right colectomy without definite evidence of local or metastatic disease.  There is mild narrowing in the region of hepatic flexure, presumably at surgical anastomosis. - CEA on 06/01/2018 has increased to 8.9.  Prior CEA 3 months ago was 7.7. - PET CT scan on 06/22/2018 shows no findings of recurrent/metastatic colon cancer.  Asymmetric palatine tonsillar activity, left greater than right. - Most likely reason for her elevated CEA is hep C liver disease.  We will plan to see her back in 3 months with repeat CEA level.  I plan to repeat CT scan in 6 months.  2.  Iron deficiency state: -Her last ferritin is above 50.  Hemoglobin today is 13.3. -She does not require any iron supplements. 

## 2018-07-21 ENCOUNTER — Encounter: Payer: Self-pay | Admitting: Internal Medicine

## 2018-07-25 ENCOUNTER — Telehealth: Payer: Self-pay

## 2018-07-25 NOTE — Telephone Encounter (Signed)
Tried to call pt to see if she can arrive earlier 08/05/18 for TCS/EGD, no answer, LMOAM for return call.

## 2018-07-25 NOTE — Telephone Encounter (Signed)
Spoke with Ariana Long and she has moved up on schedule to 8:30am, arrival time 7:30am. Patient aware 5 hrs prior to procedure time at 3:30am she will drink 2nd half of prep and npo after 4:30am. Hoyle Sauer in endo is aware of change

## 2018-07-27 ENCOUNTER — Other Ambulatory Visit: Payer: Self-pay

## 2018-07-27 DIAGNOSIS — K7460 Unspecified cirrhosis of liver: Secondary | ICD-10-CM

## 2018-07-27 NOTE — Progress Notes (Signed)
Please NIC for ruq u/s in 10/2018 for hepatoma surveillance.  Update labs at that time, CBC, CMET, PT/INR.

## 2018-07-28 ENCOUNTER — Telehealth: Payer: Self-pay | Admitting: Internal Medicine

## 2018-07-28 NOTE — Progress Notes (Signed)
Noted  

## 2018-07-28 NOTE — Telephone Encounter (Signed)
Ariana Long HER PROCEDURE

## 2018-07-28 NOTE — Telephone Encounter (Signed)
Called patient and she wanted to r/s procedure d/t COVID-19. Patient is now scheduled for 6/3 at 9:15am. New instructions mailed. Kim in endo is aware

## 2018-08-17 DIAGNOSIS — Z6825 Body mass index (BMI) 25.0-25.9, adult: Secondary | ICD-10-CM | POA: Diagnosis not present

## 2018-08-17 DIAGNOSIS — E663 Overweight: Secondary | ICD-10-CM | POA: Diagnosis not present

## 2018-08-17 DIAGNOSIS — R7309 Other abnormal glucose: Secondary | ICD-10-CM | POA: Diagnosis not present

## 2018-08-17 DIAGNOSIS — R350 Frequency of micturition: Secondary | ICD-10-CM | POA: Diagnosis not present

## 2018-08-17 DIAGNOSIS — N390 Urinary tract infection, site not specified: Secondary | ICD-10-CM | POA: Diagnosis not present

## 2018-08-19 IMAGING — CT CT CHEST W/ CM
2 of 3 series · 15 of 36 positions shown, 18 images · IV contrast (iopamidol)
Comparison: CT scan of December 03, 2015.

CLINICAL DATA: History of colon cancer.

EXAM:
CT CHEST WITH CONTRAST
TECHNIQUE: Multidetector CT imaging of the chest was performed during
intravenous contrast administration.
CONTRAST:  75mL JVEUVE-EOO IOPAMIDOL (JVEUVE-EOO) INJECTION 61%

[Series 2: axial st · axial · 0.69mm/px · z∈[+1394,+1614]mm · 12 of 130 slices shown, 15 images]
[im 10/130  mediastinal]
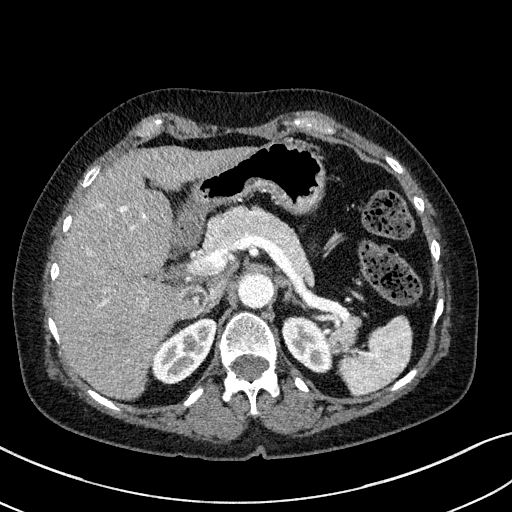
[im 10/130  lung]
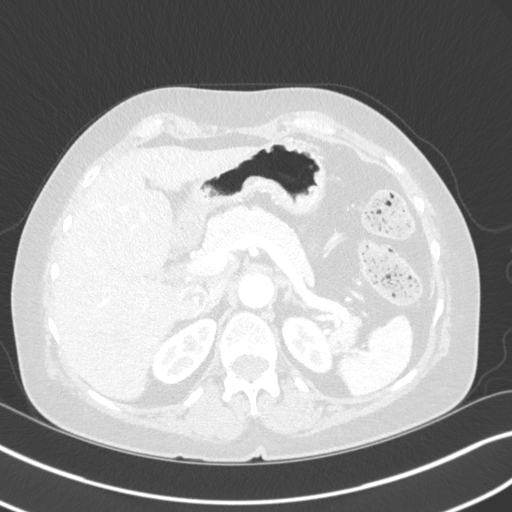
[im 20/130  lung]
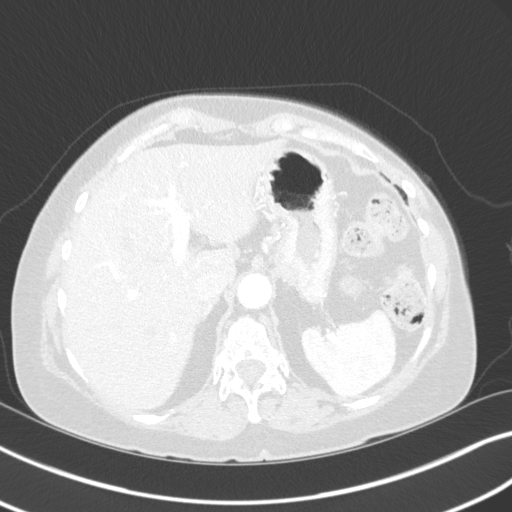
[im 29/130  lung]
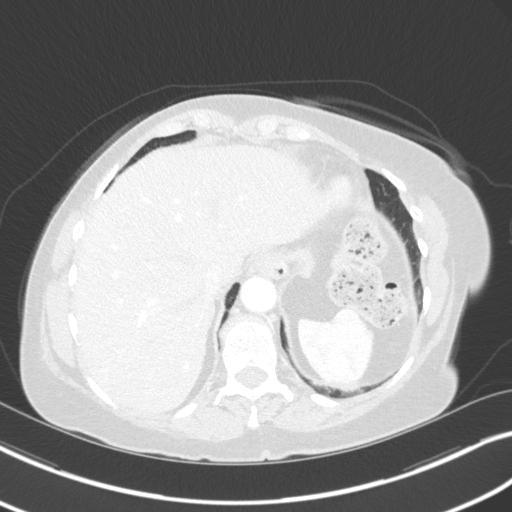
[im 39/130  lung]
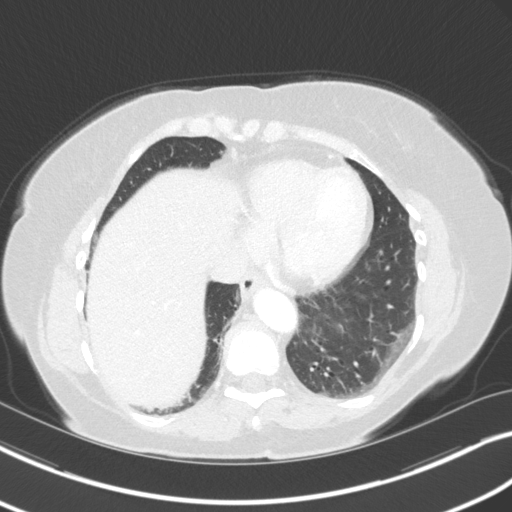
[im 48/130  mediastinal]
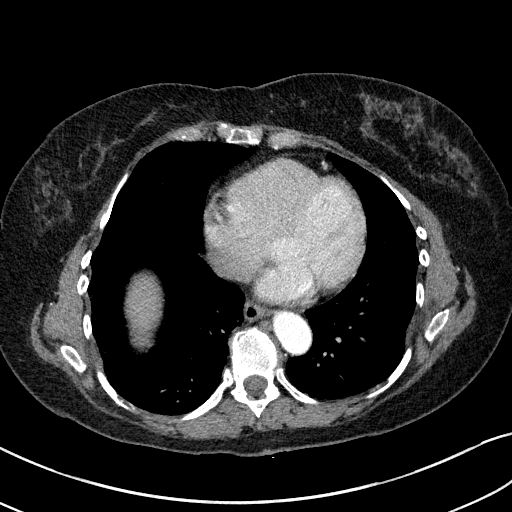
[im 48/130  lung]
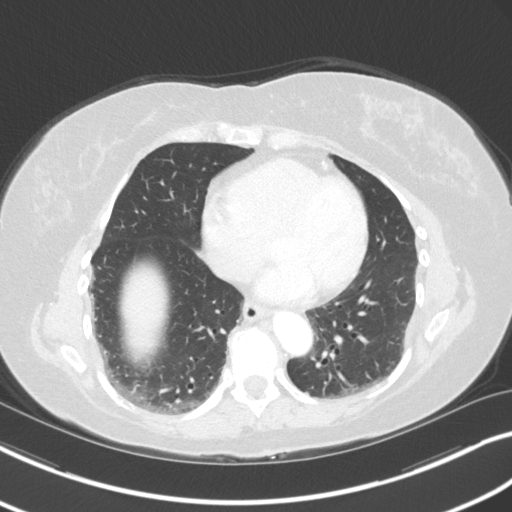
[im 58/130  lung]
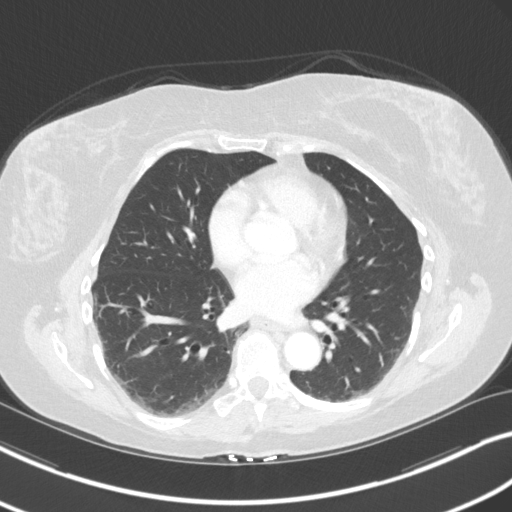
[im 72/130  lung]
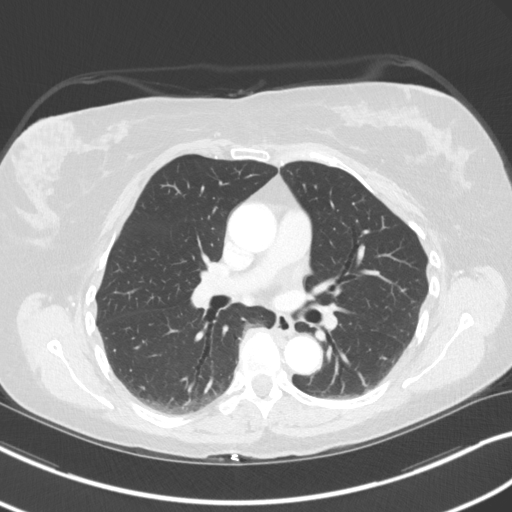
[im 82/130  lung]
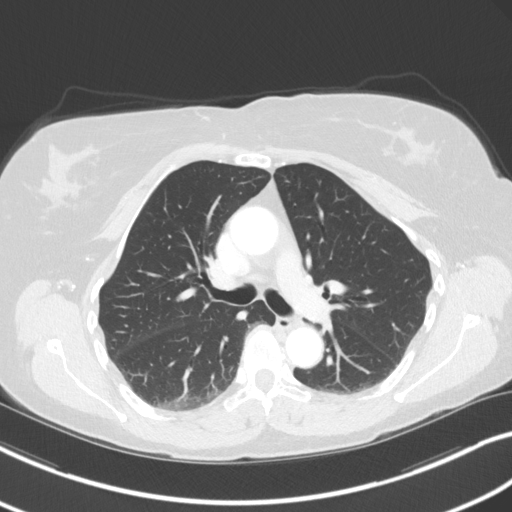
[im 91/130  mediastinal]
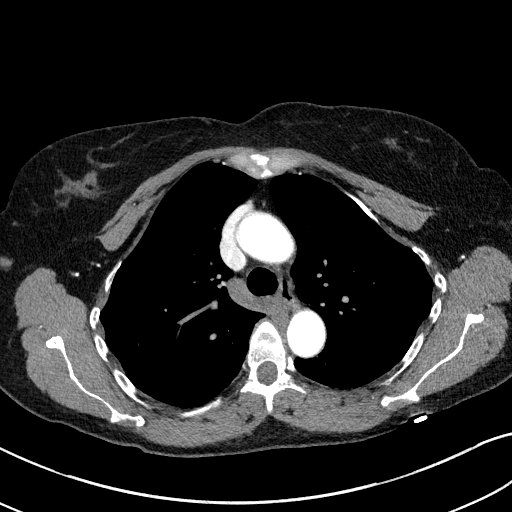
[im 91/130  lung]
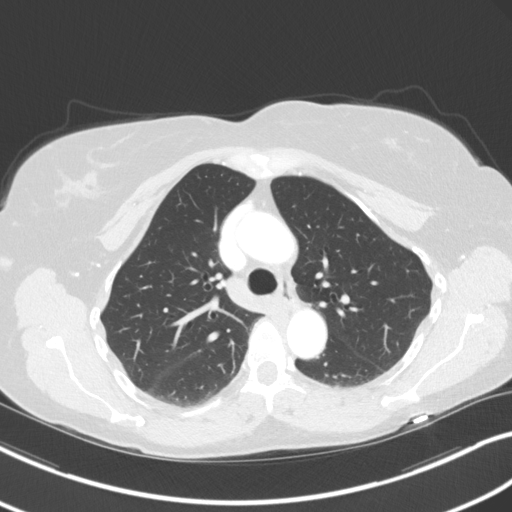
[im 101/130  lung]
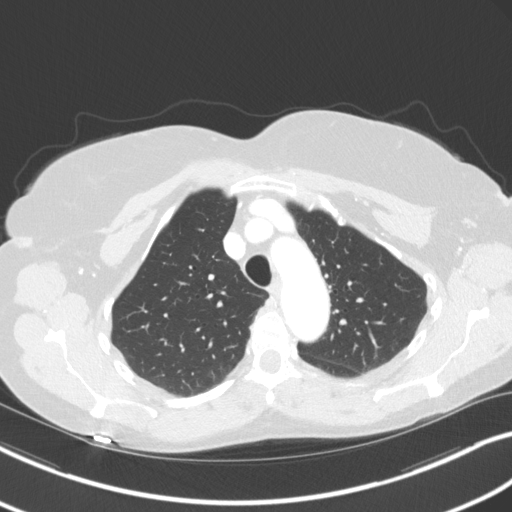
[im 110/130  lung]
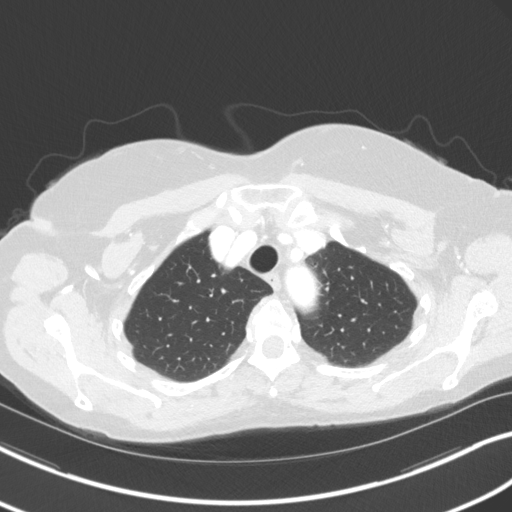
[im 120/130  lung]
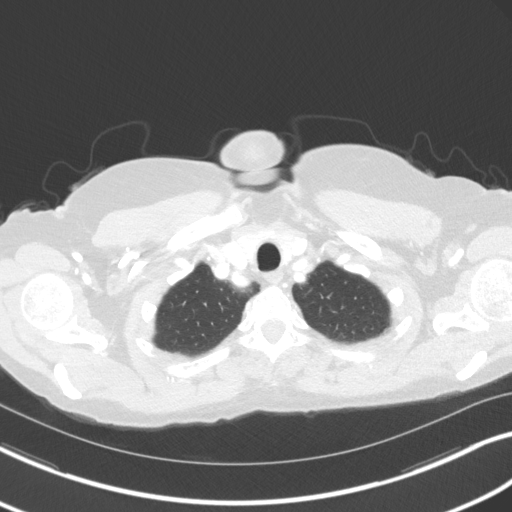

[Series 5: coronal · coronal · 0.57mm/px · 3 of 134 slices shown]
[im 27/134  lung]
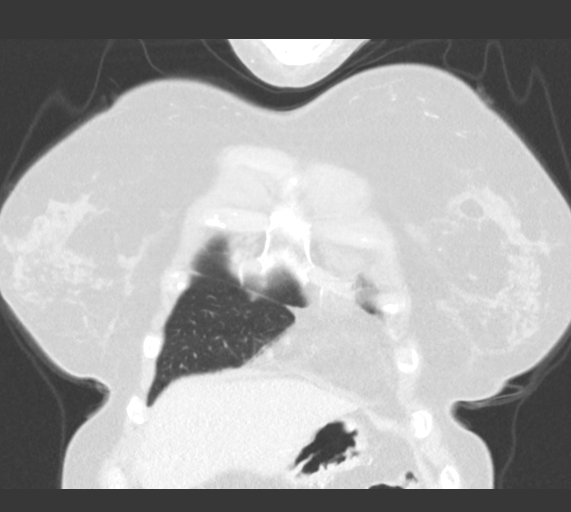
[im 54/134  lung]
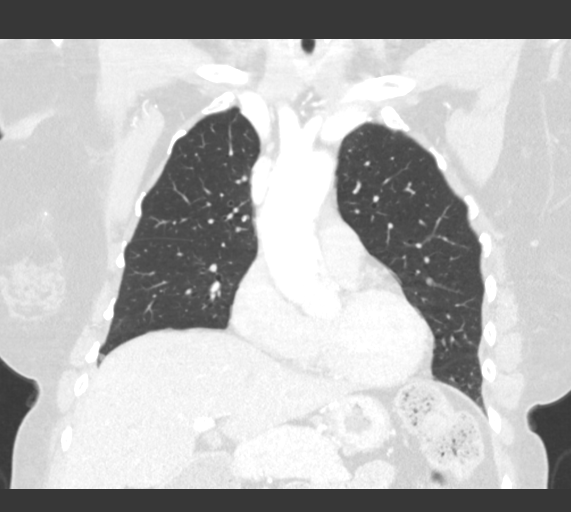
[im 80/134  lung]
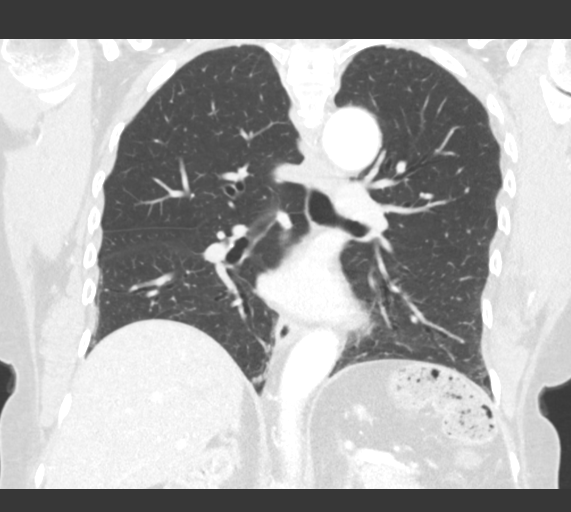

[15 of 36 positions shown; findings below may reference images not displayed]

FINDINGS: Cardiovascular: No significant vascular findings. Normal heart size.
No pericardial effusion.

Mediastinum/Nodes: No enlarged mediastinal, hilar, or axillary lymph
nodes. Thyroid gland, trachea, and esophagus demonstrate no
significant findings.

Lungs/Pleura: Lungs are clear. No pleural effusion or pneumothorax.

Upper Abdomen: No acute abnormality.

Musculoskeletal: No chest wall abnormality. No acute or significant
osseous findings.
IMPRESSION: No abnormality seen in the chest.

## 2018-08-22 ENCOUNTER — Telehealth: Payer: Self-pay | Admitting: Adult Health

## 2018-08-22 NOTE — Telephone Encounter (Signed)
Spoke with pt. Pt felt like she had a UTI. Pt had pain in lower stomach. That is better. Was doing some new exercises so she don't know if pain was coming from that. PCP prescribed Amoxicillin. Pt started having diarrhea so she stopped med. Pt wants to know if she can bring a urine by and it be sent to lab or if you would just treat her. Please advise. Thanks!! [place]

## 2018-08-22 NOTE — Telephone Encounter (Signed)
Pt wanting to see if she can bring in a urine sample for a possible UTI? She states her PCP gave her amoxicillin and potassium for the UTI but that is not helping.

## 2018-08-23 NOTE — Telephone Encounter (Signed)
Spoke with pt letting her know can come in for urine check. Pt wants to come in tomorrow. Call transferred to front desk for appt. [place]

## 2018-08-24 ENCOUNTER — Other Ambulatory Visit: Payer: Medicare Other

## 2018-08-24 ENCOUNTER — Other Ambulatory Visit: Payer: Self-pay

## 2018-08-24 DIAGNOSIS — N390 Urinary tract infection, site not specified: Secondary | ICD-10-CM

## 2018-08-24 NOTE — Progress Notes (Signed)
Pt here for urine check.urine negative. Spoke jennifer griffin about result. Send urine for urine culture. Pad CMA

## 2018-08-26 ENCOUNTER — Telehealth: Payer: Self-pay | Admitting: *Deleted

## 2018-08-26 LAB — URINE CULTURE: Organism ID, Bacteria: NO GROWTH

## 2018-08-26 NOTE — Telephone Encounter (Signed)
Left message letting pt know urine culture was normal. Ariana Long

## 2018-08-26 NOTE — Telephone Encounter (Signed)
-----   Message from Estill Dooms, NP sent at 08/26/2018  8:46 AM EDT ----- Marcie Bal please let her know urine showed no growth

## 2018-09-05 ENCOUNTER — Ambulatory Visit: Payer: Medicare Other | Admitting: Nurse Practitioner

## 2018-09-06 ENCOUNTER — Telehealth: Payer: Self-pay | Admitting: Adult Health

## 2018-09-06 NOTE — Telephone Encounter (Signed)
Pt wanting to see if she can be seen for vaginal itching and pain.

## 2018-09-13 ENCOUNTER — Telehealth: Payer: Self-pay | Admitting: *Deleted

## 2018-09-13 DIAGNOSIS — B351 Tinea unguium: Secondary | ICD-10-CM | POA: Diagnosis not present

## 2018-09-13 NOTE — Telephone Encounter (Signed)
Patient informed that we are not allowing visitors or children to come to appointments at this time. Patient advised to wear a mask to her appointment if she has one. Patient denies any contact with anyone suspected or confirmed of having COVID-19. Pt denies fever, cough, sob, muscle pain, diarrhea, rash, vomiting, abdominal pain, red eye, weakness, bruising or bleeding, joint pain or severe headache.    

## 2018-09-14 ENCOUNTER — Other Ambulatory Visit: Payer: Self-pay

## 2018-09-14 ENCOUNTER — Ambulatory Visit (INDEPENDENT_AMBULATORY_CARE_PROVIDER_SITE_OTHER): Payer: Medicare Other | Admitting: Adult Health

## 2018-09-14 ENCOUNTER — Encounter: Payer: Self-pay | Admitting: Adult Health

## 2018-09-14 VITALS — BP 106/64 | HR 65 | Temp 98.2°F | Ht 65.0 in | Wt 156.0 lb

## 2018-09-14 DIAGNOSIS — L280 Lichen simplex chronicus: Secondary | ICD-10-CM

## 2018-09-14 DIAGNOSIS — N898 Other specified noninflammatory disorders of vagina: Secondary | ICD-10-CM | POA: Diagnosis not present

## 2018-09-14 DIAGNOSIS — N941 Unspecified dyspareunia: Secondary | ICD-10-CM | POA: Diagnosis not present

## 2018-09-14 MED ORDER — ESTROGENS, CONJUGATED 0.625 MG/GM VA CREA: TOPICAL_CREAM | VAGINAL | 0 refills | Status: DC

## 2018-09-14 NOTE — Progress Notes (Signed)
Patient ID: Ariana Long, female   DOB: Oct 27, 1955, 63 y.o.   MRN: [id number] History of Present Illness: Ariana Long is a 63 year old black female, PM in complaining of vaginal itching for about 2 weeks now, is more at vaginal opening.Was treated for UTI recently.  PCP is Dr Hilma Favors.   Current Medications, Allergies, Past Medical History, Past Surgical History, Family History and Social History were reviewed in [employer] record.     Review of Systems: +itching at vaginal opening for about 2 weeks now Has been treated for UTI  +pain with sex, does not have often    Physical Exam:BP 106/64 (BP Location: Left Arm, Patient Position: Sitting, Cuff Size: Normal)   Pulse 65   Temp 98.2 F (36.8 C)   Ht 5\' 5"  (1.651 m)   Wt 156 lb (70.8 kg)   BMI 25.96 kg/m  General:  Well developed, well nourished, no acute distress Skin:  Warm and dry Pelvic:  External genitalia has dry shiny skin.  The vagina is pale with loss of moisture and rugae, no discharge noted.Marland Kitchen Urethra has no lesions or masses. The cervix is smooth.  Uterus is felt to be normal size, shape, and contour.  No adnexal masses or tenderness noted.Bladder is non tender, no masses felt. Psych:  No mood changes, alert and cooperative,seems happy Examination chaperoned by Estill Bamberg Rash LPN.  Will try premarin vaginal cream daily for 2 weeks and she will let me know if helps, she has known Lichen simplex and is using temovate.  Impression and plan: 1. Vaginal itching Will try PVC Meds ordered this encounter  Medications  . conjugated estrogens (PREMARIN) vaginal cream    Sig: Use 0.5 gm in vagina at bedtime daily for next 2 weeks    Dispense:  12 g    Refill:  0    Order Specific Question:   Supervising Provider    Answer:   Elonda Husky, LUTHER H [[id number]]    2. Lichenification and lichen simplex chronicus -continue temovate  3. Dyspareunia in female Will try PVC Pt will call me in about 2 weeks for update, if PVC  helps will give Rx No sex for next 2 weeks

## 2018-09-15 DIAGNOSIS — Z87898 Personal history of other specified conditions: Secondary | ICD-10-CM | POA: Diagnosis not present

## 2018-09-15 DIAGNOSIS — Z9049 Acquired absence of other specified parts of digestive tract: Secondary | ICD-10-CM | POA: Diagnosis not present

## 2018-09-15 DIAGNOSIS — C184 Malignant neoplasm of transverse colon: Secondary | ICD-10-CM | POA: Diagnosis not present

## 2018-09-15 DIAGNOSIS — Z98891 History of uterine scar from previous surgery: Secondary | ICD-10-CM | POA: Diagnosis not present

## 2018-09-15 DIAGNOSIS — Z8619 Personal history of other infectious and parasitic diseases: Secondary | ICD-10-CM | POA: Diagnosis not present

## 2018-09-27 ENCOUNTER — Other Ambulatory Visit: Payer: Self-pay

## 2018-09-27 ENCOUNTER — Telehealth: Payer: Self-pay | Admitting: Internal Medicine

## 2018-09-27 DIAGNOSIS — K7460 Unspecified cirrhosis of liver: Secondary | ICD-10-CM

## 2018-09-27 NOTE — Telephone Encounter (Signed)
Spoke with patient and she does not feel comfortable going to the hospital right now to have TCS/EGD with RMR right now. She wants to reschedule. Made patient aware we will call her once we have aug/sept schedule to get her rescheduled. She was fine with this. Called endo and LMOVM making aware

## 2018-09-27 NOTE — Telephone Encounter (Signed)
PLEASE CALL PATIENT ABOUT RESCHEDULING HER PROCEDURE

## 2018-10-03 ENCOUNTER — Other Ambulatory Visit: Payer: Self-pay | Admitting: *Deleted

## 2018-10-03 ENCOUNTER — Telehealth: Payer: Self-pay | Admitting: *Deleted

## 2018-10-03 DIAGNOSIS — Z8719 Personal history of other diseases of the digestive system: Secondary | ICD-10-CM

## 2018-10-03 DIAGNOSIS — Z85038 Personal history of other malignant neoplasm of large intestine: Secondary | ICD-10-CM

## 2018-10-03 NOTE — Telephone Encounter (Signed)
Called patient and she is scheduled for TCS/EGD with RMR on 12/06/2018 at 12pm. Patient already has prep at home. I advised will mail new instructions (cofirmed address). Orders entered.

## 2018-10-04 ENCOUNTER — Inpatient Hospital Stay (HOSPITAL_COMMUNITY): Payer: Medicare Other | Attending: Hematology

## 2018-10-04 ENCOUNTER — Other Ambulatory Visit: Payer: Self-pay

## 2018-10-04 DIAGNOSIS — Z823 Family history of stroke: Secondary | ICD-10-CM | POA: Insufficient documentation

## 2018-10-04 DIAGNOSIS — C183 Malignant neoplasm of hepatic flexure: Secondary | ICD-10-CM | POA: Insufficient documentation

## 2018-10-04 DIAGNOSIS — Z87442 Personal history of urinary calculi: Secondary | ICD-10-CM | POA: Diagnosis not present

## 2018-10-04 DIAGNOSIS — Z79899 Other long term (current) drug therapy: Secondary | ICD-10-CM | POA: Insufficient documentation

## 2018-10-04 DIAGNOSIS — K649 Unspecified hemorrhoids: Secondary | ICD-10-CM | POA: Insufficient documentation

## 2018-10-04 DIAGNOSIS — Z8249 Family history of ischemic heart disease and other diseases of the circulatory system: Secondary | ICD-10-CM | POA: Diagnosis not present

## 2018-10-04 DIAGNOSIS — Z87440 Personal history of urinary (tract) infections: Secondary | ICD-10-CM | POA: Insufficient documentation

## 2018-10-04 DIAGNOSIS — K7460 Unspecified cirrhosis of liver: Secondary | ICD-10-CM | POA: Diagnosis not present

## 2018-10-04 DIAGNOSIS — M810 Age-related osteoporosis without current pathological fracture: Secondary | ICD-10-CM | POA: Diagnosis not present

## 2018-10-04 DIAGNOSIS — Z9049 Acquired absence of other specified parts of digestive tract: Secondary | ICD-10-CM | POA: Insufficient documentation

## 2018-10-04 LAB — COMPREHENSIVE METABOLIC PANEL
ALT: 23 U/L (ref 0–44)
AST: 23 U/L (ref 15–41)
Albumin: 4.2 g/dL (ref 3.5–5.0)
Alkaline Phosphatase: 74 U/L (ref 38–126)
Anion gap: 11 (ref 5–15)
BUN: 14 mg/dL (ref 8–23)
CO2: 24 mmol/L (ref 22–32)
Calcium: 9.4 mg/dL (ref 8.9–10.3)
Chloride: 106 mmol/L (ref 98–111)
Creatinine, Ser: 0.7 mg/dL (ref 0.44–1.00)
GFR calc Af Amer: >60 mL/min (ref >60)
GFR calc non Af Amer: >60 mL/min (ref >60)
Glucose, Bld: 98 mg/dL (ref 70–99)
Potassium: 4.4 mmol/L (ref 3.5–5.1)
Sodium: 141 mmol/L (ref 135–145)
Total Bilirubin: 0.5 mg/dL (ref 0.3–1.2)
Total Protein: 7.9 g/dL (ref 6.5–8.1)

## 2018-10-04 LAB — CBC WITH DIFFERENTIAL/PLATELET
Abs Immature Granulocytes: 0.01 10*3/uL (ref 0.00–0.07)
Basophils Absolute: 0 10*3/uL (ref 0.0–0.1)
Basophils Relative: 1 %
Eosinophils Absolute: 0.2 10*3/uL (ref 0.0–0.5)
Eosinophils Relative: 4 %
HCT: 40.7 % (ref 36.0–46.0)
Hemoglobin: 13.5 g/dL (ref 12.0–15.0)
Immature Granulocytes: 0 %
Lymphocytes Relative: 40 %
Lymphs Abs: 1.7 10*3/uL (ref 0.7–4.0)
MCH: 31 pg (ref 26.0–34.0)
MCHC: 33.2 g/dL (ref 30.0–36.0)
MCV: 93.3 fL (ref 80.0–100.0)
Monocytes Absolute: 0.5 10*3/uL (ref 0.1–1.0)
Monocytes Relative: 12 %
Neutro Abs: 1.9 10*3/uL (ref 1.7–7.7)
Neutrophils Relative %: 43 %
Platelets: 137 10*3/uL — ABNORMAL LOW (ref 150–400)
RBC: 4.36 MIL/uL (ref 3.87–5.11)
RDW: 12.8 % (ref 11.5–15.5)
WBC: 4.3 10*3/uL (ref 4.0–10.5)
nRBC: 0 % (ref 0.0–0.2)

## 2018-10-05 ENCOUNTER — Other Ambulatory Visit (HOSPITAL_COMMUNITY): Payer: Medicare Other

## 2018-10-05 ENCOUNTER — Encounter (HOSPITAL_COMMUNITY): Admission: RE | Payer: Self-pay | Source: Home / Self Care

## 2018-10-05 ENCOUNTER — Ambulatory Visit (HOSPITAL_COMMUNITY): Admission: RE | Admit: 2018-10-05 | Payer: Medicare Other | Source: Home / Self Care | Admitting: Internal Medicine

## 2018-10-05 LAB — CEA: CEA: 6.6 ng/mL — ABNORMAL HIGH (ref 0.0–4.7)

## 2018-10-05 SURGERY — COLONOSCOPY
Anesthesia: Moderate Sedation

## 2018-10-11 ENCOUNTER — Encounter: Payer: Self-pay | Admitting: *Deleted

## 2018-10-11 ENCOUNTER — Other Ambulatory Visit: Payer: Self-pay

## 2018-10-11 ENCOUNTER — Telehealth: Payer: Self-pay | Admitting: Internal Medicine

## 2018-10-11 NOTE — Telephone Encounter (Signed)
Recall for ultrasound 

## 2018-10-11 NOTE — Telephone Encounter (Signed)
Recall sent 

## 2018-10-12 ENCOUNTER — Inpatient Hospital Stay (HOSPITAL_BASED_OUTPATIENT_CLINIC_OR_DEPARTMENT_OTHER): Payer: Medicare Other | Admitting: Hematology

## 2018-10-12 ENCOUNTER — Encounter (HOSPITAL_COMMUNITY): Payer: Self-pay | Admitting: Hematology

## 2018-10-12 VITALS — BP 117/81 | HR 70 | Temp 98.4°F | Resp 16 | Wt 154.5 lb

## 2018-10-12 DIAGNOSIS — K7460 Unspecified cirrhosis of liver: Secondary | ICD-10-CM

## 2018-10-12 DIAGNOSIS — Z9049 Acquired absence of other specified parts of digestive tract: Secondary | ICD-10-CM | POA: Diagnosis not present

## 2018-10-12 DIAGNOSIS — Z8249 Family history of ischemic heart disease and other diseases of the circulatory system: Secondary | ICD-10-CM | POA: Diagnosis not present

## 2018-10-12 DIAGNOSIS — K649 Unspecified hemorrhoids: Secondary | ICD-10-CM | POA: Diagnosis not present

## 2018-10-12 DIAGNOSIS — C183 Malignant neoplasm of hepatic flexure: Secondary | ICD-10-CM | POA: Diagnosis not present

## 2018-10-12 DIAGNOSIS — Z79899 Other long term (current) drug therapy: Secondary | ICD-10-CM

## 2018-10-12 DIAGNOSIS — Z823 Family history of stroke: Secondary | ICD-10-CM

## 2018-10-12 DIAGNOSIS — M810 Age-related osteoporosis without current pathological fracture: Secondary | ICD-10-CM | POA: Diagnosis not present

## 2018-10-12 NOTE — Patient Instructions (Addendum)
[facility] at [facility] Discharge Instructions  You were seen today by Dr. Katragadda. He went over your recent lab results. He will see you back in 3 months for labs and follow up.   Thank you for choosing [facility] at [facility] to provide your oncology and hematology care.  To afford each patient quality time with our provider, please arrive at least 15 minutes before your scheduled appointment time.   If you have a lab appointment with the Cancer Center please come in thru the  Main Entrance and check in at the main information desk  You need to re-schedule your appointment should you arrive 10 or more minutes late.  We strive to give you quality time with our providers, and arriving late affects you and other patients whose appointments are after yours.  Also, if you no show three or more times for appointments you may be dismissed from the clinic at the providers discretion.     Again, thank you for choosing [facility].  Our hope is that these requests will decrease the amount of time that you wait before being seen by our physicians.       _____________________________________________________________  Should you have questions after your visit to [facility], please contact our office at [phone] between the hours of 8:00 a.m. and 4:30 p.m.  Voicemails left after 4:00 p.m. will not be returned until the following business day.  For prescription refill requests, have your pharmacy contact our office and allow 72 hours.    Cancer Center Support Programs:   > Cancer Support Group  2nd Tuesday of the month 1pm-2pm, Journey Room    

## 2018-10-12 NOTE — Progress Notes (Signed)
Ariana Long, [place] [id number]   CLINIC:  Medical Oncology/Hematology  PCP:  Ariana Long, Ariana Long   REASON FOR VISIT:  Follow-up for stage II colon cancer  CURRENT THERAPY: observation  BRIEF ONCOLOGIC HISTORY:    Primary cancer of hepatic flexure of colon (Ariana Long)   06/15/2017 Initial Diagnosis    Primary cancer of hepatic flexure of colon (Ariana Long)    06/15/2017 Cancer Staging    Staging form: Colon and Rectum - Neuroendocine Tumors, AJCC 8th Edition - Clinical stage from 06/15/2017: Stage IIA (cT2, cN0, cM0) - Signed by Ariana Jack, MD on 08/18/2017      CANCER STAGING: Cancer Staging Primary cancer of hepatic flexure of colon (Ariana Long) Staging form: Colon and Rectum - Neuroendocine Tumors, AJCC 8th Edition - Clinical stage from 06/15/2017: Stage IIA (cT2, cN0, cM0) - Signed by Ariana Jack, MD on 08/18/2017    INTERVAL HISTORY:  Ariana Long 63 y.o. female returns for follow-up of stage II cancer.  Denies any recent infections or hospitalizations.  Denies any change in bowel habits.  Denies any bleeding per rectum or melena.  Denies any new onset pains.  Appetite is 100%.  Energy levels are 75%.    REVIEW OF SYSTEMS:  Review of Systems  All other systems reviewed and are negative.    PAST MEDICAL/SURGICAL HISTORY:  Past Medical History:  Diagnosis Date  . Cirrhosis of liver ([place]) 01/26/2011   FIBROSIS on liver biopsy in 2007, U/S  03/31/2013= mild diffuse hepatic steatosis and /or hepatocellular disease without focal hepatic parenchymal abnormality. per Ariana Long (2012) pt is immune to hep A and Hep B. per 12/06/13 note from Liver clinic-pt had MRI which showed cirrhosis 2015.  . Diverticulitis of colon   . Elevated AFP 01/26/2011  . Gall stones   . Hematuria 12/12/2014  . Hemorrhoid 12/01/2012  . Hemorrhoids   . Hepatitis C 01/26/2011   2005 non responder, genotype 1, Gr  1-2, Stage 3 . Treated with Harvoni at the Liver clinic, responder  . Hidradenitis   . History of hiatal hernia   . Hx: UTI (urinary tract infection)   . Kidney stones 10/2011  . Osteoporosis   . Primary cancer of hepatic flexure of colon ([place]) 06/15/2017  . Serrated adenoma of colon 11/2011  . Splenomegaly 01/26/2011  . Thrombocytopenia ([place]) 01/26/2011  . Tubular adenoma   . Vulval lesion 12/14/2013   Has kissing lesions thin and puffy and paler i color about 5-6- mm in diameter.Dr Ariana Long in to co examine will try temovate and recheck in 3 months   Past Surgical History:  Procedure Laterality Date  . AXILLARY SURGERY Bilateral 1979   "glands removed"  . BREAST BIOPSY Right 2011  . BREAST EXCISIONAL BIOPSY Bilateral 25 years ago   Benign, fatty tumors  . CESAREAN SECTION     x 2  . COLECTOMY  06/15/2017   LAPAROSCOPIC ASSISTED RIGHT COLECTOMY, ERAS PATHWAY/notes 06/15/2017  . COLONOSCOPY  07/31/2005   [facility]-Normal rectum/Normal colonoscopy/Repeat screening colonoscopy ten years  . COLONOSCOPY  11/16/2011   RMR: Friable anorectum-likely source of hematochezia/ LARGE 1.2x2.5CM SERRATED ADENOMA resected from ascending colon 7CM DISTAL TO ICV, hyperplastic polyp removed   . COLONOSCOPY N/A 07/18/2012   Dr. Gala Long- normal appearing rectal mucosa. no residual polpy tissue seen at tatoo location. remainder of colonic mucosa appeared entirely normal. bx = tubular adenoma  . COLONOSCOPY N/A 08/29/2015   Dr. Gala Long:  single 12 mm polyp in ascending colon, 4 mm polyp ascending. Sessile serrated adenoma. 3 year surveillance  . COLONOSCOPY N/A 05/19/2017   4X2 cm sessile lesion at hepatic flexdure, unable to be completely removed  . ESOPHAGEAL BANDING N/A 08/29/2015   Procedure: ESOPHAGEAL BANDING;  Surgeon: Ariana Dolin, MD;  [facility];  Service: Endoscopy;  Laterality: N/A;  . ESOPHAGOGASTRODUODENOSCOPY N/A 08/29/2015   Dr. Gala Long: non-critical Schatzki's ring, somewhat prominent small  submucosal vessels without obvious varices. hyperplastic gastric polyp. 2 year surveillance  . HEMORRHOID BANDING    . LAPAROSCOPIC RIGHT COLECTOMY N/A 06/15/2017   Procedure: LAPAROSCOPIC ASSISTED RIGHT COLECTOMY, ERAS PATHWAY;  Surgeon: Ariana Skates, MD;  Location: [facility];  Service: General;  Laterality: N/A;  . POLYPECTOMY  05/19/2017   Procedure: POLYPECTOMY;  Surgeon: Ariana Dolin, MD;  Location: [facility];  Service: Endoscopy;;  . SKIN LESION EXCISION       SOCIAL HISTORY:  Social History   Socioeconomic History  . Marital status: Married    Spouse name: Not on file  . Number of children: 2  . Years of education: Not on file  . Highest education level: Not on file  Occupational History  . Occupation: Occupational psychologist: [employer]  . Financial resource strain: Not hard at all  . Food insecurity:    Worry: Never true    Inability: Never true  . Transportation needs:    Medical: No    Non-medical: No  Tobacco Use  . Smoking status: Never Smoker  . Smokeless tobacco: Never Used  Substance and Sexual Activity  . Alcohol use: No  . Drug use: No  . Sexual activity: Yes    Birth control/protection: Post-menopausal  Lifestyle  . Physical activity:    Days per week: 3 days    Minutes per session: 30 min  . Stress: Not at all  Relationships  . Social connections:    Talks on phone: More than three times a week    Gets together: More than three times a week    Attends religious service: More than 4 times per year    Active member of club or organization: Yes    Attends meetings of clubs or organizations: More than 4 times per year    Relationship status: Married  . Intimate partner violence:    Fear of current or ex partner: No    Emotionally abused: No    Physically abused: No    Forced sexual activity: No  Other Topics Concern  . Not on file  Social History Narrative  . Not on file    FAMILY HISTORY:  Family History  Problem  Relation Age of Onset  . Heart attack Mother   . Stroke Mother   . Aneurysm Father   . Colon cancer Neg Hx     CURRENT MEDICATIONS:  Outpatient Encounter Medications as of 10/12/2018  Medication Sig Note  . CALCIUM-MAGNESIUM PO Take 1 tablet by mouth daily.   . Cholecalciferol 2000 units TABS Take 6,000 Units by mouth daily.   . clobetasol ointment (TEMOVATE) 0.05 % Use 2-3 x weekly   . Coenzyme Q10 (COQ10) 100 MG CAPS Take 100 mg by mouth as needed.    . conjugated estrogens (PREMARIN) vaginal cream Use 0.5 gm in vagina at bedtime daily for next 2 weeks   . cyanocobalamin 500 MCG tablet Take 500 mcg by mouth daily.   . fluticasone (CUTIVATE) 0.05 %  cream Apply 1 application topically 2 (two) times daily as needed (lichen planus).   . fluticasone (FLONASE) 50 MCG/ACT nasal spray Place 1 spray into both nostrils daily as needed for allergies or rhinitis.   . hydrocortisone (ANUSOL-HC) 25 MG suppository Place 1 suppository (25 mg total) rectally every 12 (twelve) hours.   . milk thistle 175 MG tablet Take 175 mg by mouth daily.   . Multiple Vitamin (MULTIVITAMIN WITH MINERALS) TABS tablet Take 1 tablet by mouth every other day.   . Multiple Vitamins-Minerals (HAIR SKIN AND NAILS FORMULA) TABS Take 2 tablets by mouth daily. 2500 mcg per serving   . naproxen sodium (ALEVE) 220 MG tablet Take 220 mg by mouth daily as needed (pain).   Marland Kitchen OLIVE LEAF EXTRACT PO Take 1 tablet by mouth daily.   Marland Kitchen PAU D ARCO PO Take 1 tablet by mouth daily.   . Probiotic CAPS Take 1 capsule by mouth 2 (two) times daily.   . Selenium 100 MCG TABS Take 100 mcg by mouth daily.    . vitamin C (ASCORBIC ACID) 500 MG tablet Take 500 mg by mouth daily.    . polyethylene glycol-electrolytes (TRILYTE) 420 g solution Take 4,000 mLs by mouth as directed. (Patient not taking: Reported on 09/14/2018) 07/26/2018: Hasnt started   No facility-administered encounter medications on file as of 10/12/2018.     ALLERGIES:  No Known  Allergies   PHYSICAL EXAM:  ECOG Performance status: 0  Vitals:   10/12/18 0925  BP: 117/81  Pulse: 70  Resp: 16  Temp: 98.4 F (36.9 C)  SpO2: 96%   Filed Weights   10/12/18 0925  Weight: 154 lb 8 oz (70.1 kg)    Physical Exam Cardiovascular:     Rate and Rhythm: Normal rate and regular rhythm.     Heart sounds: Normal heart sounds.  Pulmonary:     Effort: Pulmonary effort is normal.     Breath sounds: Normal breath sounds.  Skin:    General: Skin is warm and dry.  Neurological:     Mental Status: She is alert and oriented to person, place, and time.   Abdomen: Soft nontender with no palpable abnormality.   LABORATORY DATA:  I have reviewed the labs as listed.  CBC    Component Value Date/Time   WBC 4.3 10/04/2018 0938   RBC 4.36 10/04/2018 0938   HGB 13.5 10/04/2018 0938   HCT 40.7 10/04/2018 0938   HCT 42 02/25/2012 1033   PLT 137 (L) 10/04/2018 0938   MCV 93.3 10/04/2018 0938   MCH 31.0 10/04/2018 0938   MCHC 33.2 10/04/2018 0938   RDW 12.8 10/04/2018 0938   LYMPHSABS 1.7 10/04/2018 0938   MONOABS 0.5 10/04/2018 0938   EOSABS 0.2 10/04/2018 0938   BASOSABS 0.0 10/04/2018 0938   CMP Latest Ref Rng & Units 10/04/2018 07/06/2018 06/01/2018  Glucose 70 - 99 mg/dL 98 95 92  BUN 8 - 23 mg/dL '14 19 10  ' Creatinine 0.44 - 1.00 mg/dL 0.70 0.73 0.74  Sodium 135 - 145 mmol/L 141 141 135  Potassium 3.5 - 5.1 mmol/L 4.4 3.6 3.7  Chloride 98 - 111 mmol/L 106 110 103  CO2 22 - 32 mmol/L '24 23 25  ' Calcium 8.9 - 10.3 mg/dL 9.4 9.3 9.5  Total Protein 6.5 - 8.1 g/dL 7.9 7.9 8.3(H)  Total Bilirubin 0.3 - 1.2 mg/dL 0.5 0.6 0.6  Alkaline Phos 38 - 126 U/L 74 76 62  AST 15 - 41 U/L  '23 22 26  ' ALT 0 - 44 U/L '23 17 22      ' Radiology: I have independently reviewed CT scan images and discussed with the patient.   ASSESSMENT & PLAN:   Primary cancer of hepatic flexure of colon ([place]) 1.  Stage II (T2N0) hepatic flexure colon adenocarcinoma: -Status post right colon  segmental resection on 06/15/2017, 3.2 cm tumor, 0/12 lymph nodes positive.  MMR normal/MSI low -CEA a month after surgery was elevated at 6.3.  CT of the chest did not show any metastatic disease.  -PET/CT scan on 08/16/2017 did not show any evidence of recurrence or metastatic disease.  Her CEA elevation is likely from her liver disease.  She had a history of hepatitis C and was treated for it. -CT scan of the abdomen and pelvis on 02/23/2018 shows postoperative changes of partial right colectomy without definite evidence of local or metastatic disease.  There is mild narrowing in the region of hepatic flexure, presumably at surgical anastomosis. - CEA on 06/01/2018 has increased to 8.9.  Prior CEA 3 months ago was 7.7. - PET CT scan on 06/22/2018 shows no findings of recurrent/metastatic colon cancer.  Asymmetric palatine tonsillar activity, left greater than right. - Most likely reason for elevated CEA is hep C liver disease.  Her CEA has improved to 6.6 on 10/04/2018.  She does not have any symptoms of recurrence at this time.  Physical exam is within normal limits. -We will see her back in 3 months for follow-up.  I plan to repeat CT of the abdomen and pelvis along with a CEA level.  2.  Iron deficiency state: -Last ferritin was 50.  Hemoglobin today is 13.5.  She does not require any iron supplements.       Orders placed this encounter:  Orders Placed This Encounter  Procedures  . CT Abdomen Pelvis W Contrast  . CBC with Differential/Platelet  . Comprehensive metabolic panel  . CEA      Ariana Jack, MD [place] [phone]

## 2018-10-12 NOTE — Assessment & Plan Note (Signed)
1.  Stage II (T2N0) hepatic flexure colon adenocarcinoma: -Status post right colon segmental resection on 06/15/2017, 3.2 cm tumor, 0/12 lymph nodes positive.  MMR normal/MSI low -CEA a month after surgery was elevated at 6.3.  CT of the chest did not show any metastatic disease.  -PET/CT scan on 08/16/2017 did not show any evidence of recurrence or metastatic disease.  Her CEA elevation is likely from her liver disease.  She had a history of hepatitis C and was treated for it. -CT scan of the abdomen and pelvis on 02/23/2018 shows postoperative changes of partial right colectomy without definite evidence of local or metastatic disease.  There is mild narrowing in the region of hepatic flexure, presumably at surgical anastomosis. - CEA on 06/01/2018 has increased to 8.9.  Prior CEA 3 months ago was 7.7. - PET CT scan on 06/22/2018 shows no findings of recurrent/metastatic colon cancer.  Asymmetric palatine tonsillar activity, left greater than right. - Most likely reason for elevated CEA is hep C liver disease.  Her CEA has improved to 6.6 on 10/04/2018.  She does not have any symptoms of recurrence at this time.  Physical exam is within normal limits. -We will see her back in 3 months for follow-up.  I plan to repeat CT of the abdomen and pelvis along with a CEA level.  2.  Iron deficiency state: -Last ferritin was 50.  Hemoglobin today is 13.5.  She does not require any iron supplements.

## 2018-10-20 DIAGNOSIS — Z1389 Encounter for screening for other disorder: Secondary | ICD-10-CM | POA: Diagnosis not present

## 2018-10-20 DIAGNOSIS — Z6829 Body mass index (BMI) 29.0-29.9, adult: Secondary | ICD-10-CM | POA: Diagnosis not present

## 2018-10-20 DIAGNOSIS — R002 Palpitations: Secondary | ICD-10-CM | POA: Diagnosis not present

## 2018-10-20 DIAGNOSIS — E663 Overweight: Secondary | ICD-10-CM | POA: Diagnosis not present

## 2018-11-01 DIAGNOSIS — L438 Other lichen planus: Secondary | ICD-10-CM | POA: Diagnosis not present

## 2018-11-03 ENCOUNTER — Telehealth: Payer: Self-pay | Admitting: *Deleted

## 2018-11-03 NOTE — Telephone Encounter (Signed)
Called patient. She is aware she will need COVID-19 testing and will have to remain quarantined at home after testing until procedure is done. She voiced understaning. Patient scheduled for testing 7/31 at 3:20pm. Patient aware of location for testing

## 2018-11-21 ENCOUNTER — Telehealth: Payer: Self-pay | Admitting: *Deleted

## 2018-11-21 NOTE — Telephone Encounter (Signed)
Patient called in. She received her recall letter for RUQ u/s. She is scheduled for CT a/p 9/9 by Dr. Raliegh Ip. She is wondering if this is still needed since she will have that scan done?

## 2018-11-22 NOTE — Telephone Encounter (Signed)
It's ok to just do the CT A/P with Dr. Raliegh Ip as scheduled. I will look at it when it becomes available.   She is due for our bloodwork now but Dr. Raliegh Ip has CBC, CMET scheduled for 01/2019. Can we have them do a PT/INR then as well?

## 2018-11-22 NOTE — Telephone Encounter (Signed)
Pt aware regarding CT.

## 2018-11-24 ENCOUNTER — Other Ambulatory Visit: Payer: Self-pay

## 2018-11-24 DIAGNOSIS — Z85038 Personal history of other malignant neoplasm of large intestine: Secondary | ICD-10-CM

## 2018-11-24 DIAGNOSIS — C183 Malignant neoplasm of hepatic flexure: Secondary | ICD-10-CM

## 2018-11-24 NOTE — Telephone Encounter (Signed)
Spoke with a nurse a Dr. Marthann Schiller office. She will make sure that the labs will be added to the other labs CBS, CMET on 01/12/19. She is aware that the lab orders have been placed and she will see it in their system.

## 2018-11-29 ENCOUNTER — Encounter: Payer: Self-pay | Admitting: Student

## 2018-11-29 ENCOUNTER — Telehealth: Payer: Self-pay | Admitting: Student

## 2018-11-29 ENCOUNTER — Telehealth (INDEPENDENT_AMBULATORY_CARE_PROVIDER_SITE_OTHER): Payer: Medicare Other | Admitting: Student

## 2018-11-29 ENCOUNTER — Telehealth: Payer: Self-pay | Admitting: *Deleted

## 2018-11-29 VITALS — BP 138/87 | HR 68 | Ht 65.5 in | Wt 156.0 lb

## 2018-11-29 DIAGNOSIS — R002 Palpitations: Secondary | ICD-10-CM

## 2018-11-29 DIAGNOSIS — Z85038 Personal history of other malignant neoplasm of large intestine: Secondary | ICD-10-CM

## 2018-11-29 NOTE — Telephone Encounter (Signed)
Patient called. She is scheduled for TCS 12/06/2018. She has been having palpitations/abnormal heart rhythm. She spoke with her doctor about this and they are going to have her wear a heart monitor for 1-2 weeks to see what is going on. Based on results she may be referred out.She is suppose to get the heart monitor this week. She is wanting to know if she should put off her TCS for now until they can see what is going on with her heart?

## 2018-11-29 NOTE — Telephone Encounter (Signed)
°  Precert needed for: Event Monitor    Location: [facility]    Date: November 30, 2018

## 2018-11-29 NOTE — Patient Instructions (Signed)
Medication Instructions:  Your physician recommends that you continue on your current medications as directed. Please refer to the Current Medication list given to you today.   Labwork: NONE   Testing/Procedures: Your physician has recommended that you wear an event monitor. Event monitors are medical devices that record the heart's electrical activity. Doctors most often [place] these monitors to diagnose arrhythmias. Arrhythmias are problems with the speed or rhythm of the heartbeat. The monitor is a small, portable device. You can wear one while you do your normal daily activities. This is usually used to diagnose what is causing palpitations/syncope (passing out).    Follow-Up: Your physician recommends that you schedule a follow-up appointment in: 3 Month with Dr. Johnsie Cancel    Any Other Special Instructions Will Be Listed Below (If Applicable).     If you need a refill on your cardiac medications before your next appointment, please call your pharmacy.  Thank you for choosing [facility]!

## 2018-11-29 NOTE — Progress Notes (Signed)
Virtual Visit via Video Note   This visit type was conducted due to national recommendations for restrictions regarding the COVID-19 Pandemic (e.g. social distancing) in an effort to limit this patient's exposure and mitigate transmission in our community.  Due to her co-morbid illnesses, this patient is at least at moderate risk for complications without adequate follow up.  This format is felt to be most appropriate for this patient at this time.  All issues noted in this document were discussed and addressed.  A limited physical exam was performed with this format.  Please refer to the patient's chart for her consent to telehealth for [facility].   Date:  11/29/2018   ID:  Ariana Long, DOB 09/29/1955, MRN [id number]  Patient Location: Home Provider Location: Office  PCP:  Sharilyn Sites, MD  Cardiologist:  Jenkins Rouge, MD  Electrophysiologist:  None   Evaluation Performed:  Follow-Up Visit  Chief Complaint:  Palpitations  History of Present Illness:    Ariana Long is a 63 y.o. female with past medical history of palpitations (history of PAC's by prior monitor in 2014), liver cirrhosis, Hepatitis C, and colon cancer (s/p right colon segmental resection in 06/2017) who presents for a follow-up telehealth visit in regards to palpitations.   She was last examined by Dr. Johnsie Cancel in 02/2016 for follow-up from a recent Emergency Dept visit for chest pain. Her discomfort had been relieved with NSAIDS and Prednisone. She denied any recurrent symptoms at follow-up but given nonspecific ST abnormalities on her EKG, an Exercise Myoview was recommended for ischemic evaluation. This showed no significant EKG changes and no evidence of ischemia or prior infarct by imaging. Was overall a low-risk study and she was informed to follow-up with Cardiology as needed.   Was evaluated by her PCP in 10/2018 and reported intermittent palpitations. By review of notes, an EKG was obtained which showed  NSR and no acute ST abnormalities but the tracing was not sent for review. Was refereed to Cardiology for further evaluation.   In talking with the patient today, she reports worsening palpitations starting approximately 3 months ago. Was consuming a significant amount of caffeine at that time including coffee and soft drinks. Upon discontinuing this her symptoms improved and she reports that her palpitations are not as severe. She does experience an intermittent fluttering sensation along her left pectoral region which can occur at rest or with activity. Typically lasts for a few seconds then spontaneously resolves. No tachy-palpitations. Mostly noticeable at nighttime. She denies any associated chest pain, dyspnea on exertion, orthopnea, PND, or lower extremity edema. No recent headaches, dizziness, or presyncope.  She has been walking for 1-2 miles each day and denies any pain or dyspnea with this but does experience palpitations at that time.   The patient does not have symptoms concerning for COVID-19 infection (fever, chills, cough, or new shortness of breath).    Past Medical History:  Diagnosis Date  . Cirrhosis of liver ([place]) 01/26/2011   FIBROSIS on liver biopsy in 2007, U/S  03/31/2013= mild diffuse hepatic steatosis and /or hepatocellular disease without focal hepatic parenchymal abnormality. per Dr. Patsy Baltimore (2012) pt is immune to hep A and Hep B. per 12/06/13 note from Liver clinic-pt had MRI which showed cirrhosis 2015.  . Diverticulitis of colon   . Elevated AFP 01/26/2011  . Gall stones   . Hematuria 12/12/2014  . Hemorrhoid 12/01/2012  . Hemorrhoids   . Hepatitis C 01/26/2011   2005 non responder, genotype  1, Gr 1-2, Stage 3 . Treated with Harvoni at the Liver clinic, responder  . Hidradenitis   . History of hiatal hernia   . Hx: UTI (urinary tract infection)   . Kidney stones 10/2011  . Osteoporosis   . Primary cancer of hepatic flexure of colon ([place]) 06/15/2017  . Serrated adenoma  of colon 11/2011  . Splenomegaly 01/26/2011  . Thrombocytopenia ([place]) 01/26/2011  . Tubular adenoma   . Vulval lesion 12/14/2013   Has kissing lesions thin and puffy and paler i color about 5-6- mm in diameter.Dr Glo Herring in to co examine will try temovate and recheck in 3 months   Past Surgical History:  Procedure Laterality Date  . AXILLARY SURGERY Bilateral 1979   "glands removed"  . BREAST BIOPSY Right 2011  . BREAST EXCISIONAL BIOPSY Bilateral 25 years ago   Benign, fatty tumors  . CESAREAN SECTION     x 2  . COLECTOMY  06/15/2017   LAPAROSCOPIC ASSISTED RIGHT COLECTOMY, ERAS PATHWAY/notes 06/15/2017  . COLONOSCOPY  07/31/2005   Rourk-Normal rectum/Normal colonoscopy/Repeat screening colonoscopy ten years  . COLONOSCOPY  11/16/2011   RMR: Friable anorectum-likely source of hematochezia/ LARGE 1.2x2.5CM SERRATED ADENOMA resected from ascending colon 7CM DISTAL TO ICV, hyperplastic polyp removed   . COLONOSCOPY N/A 07/18/2012   Dr. Gala Romney- normal appearing rectal mucosa. no residual polpy tissue seen at tatoo location. remainder of colonic mucosa appeared entirely normal. bx = tubular adenoma  . COLONOSCOPY N/A 08/29/2015   Dr. Gala Romney: single 12 mm polyp in ascending colon, 4 mm polyp ascending. Sessile serrated adenoma. 3 year surveillance  . COLONOSCOPY N/A 05/19/2017   4X2 cm sessile lesion at hepatic flexdure, unable to be completely removed  . ESOPHAGEAL BANDING N/A 08/29/2015   Procedure: ESOPHAGEAL BANDING;  Surgeon: Daneil Dolin, MD;  [facility];  Service: Endoscopy;  Laterality: N/A;  . ESOPHAGOGASTRODUODENOSCOPY N/A 08/29/2015   Dr. Gala Romney: non-critical Schatzki's ring, somewhat prominent small submucosal vessels without obvious varices. hyperplastic gastric polyp. 2 year surveillance  . HEMORRHOID BANDING    . LAPAROSCOPIC RIGHT COLECTOMY N/A 06/15/2017   Procedure: LAPAROSCOPIC ASSISTED RIGHT COLECTOMY, ERAS PATHWAY;  Surgeon: Fanny Skates, MD;  Location: [facility];   Service: General;  Laterality: N/A;  . POLYPECTOMY  05/19/2017   Procedure: POLYPECTOMY;  Surgeon: Daneil Dolin, MD;  Location: [facility];  Service: Endoscopy;;  . SKIN LESION EXCISION       Current Meds  Medication Sig  . CALCIUM-MAGNESIUM PO Take 1 tablet by mouth every evening.   . clobetasol ointment (TEMOVATE) 0.05 % Use 2-3 x weekly (Patient taking differently: Apply 1 application topically Long as needed (lichen planus). )  . conjugated estrogens (PREMARIN) vaginal cream Use 0.5 gm in vagina at bedtime Long for next 2 weeks (Patient not taking: Reported on 11/29/2018)  . fluticasone (CUTIVATE) 0.05 % cream Apply 1 application topically 2 (two) times Long as needed (lichen planus).  . fluticasone (FLONASE) 50 MCG/ACT nasal spray Place 1 spray into both nostrils Long as needed for allergies or rhinitis.  . hydrocortisone (ANUSOL-HC) 25 MG suppository Place 1 suppository (25 mg total) rectally every 12 (twelve) hours. (Patient taking differently: Place 25 mg rectally 2 (two) times Long as needed for hemorrhoids. )  . Multiple Vitamin (MULTIVITAMIN WITH MINERALS) TABS tablet Take 1 tablet by mouth every other day. In the morning.  Marland Kitchen OLIVE LEAF EXTRACT PO Take 1 tablet by mouth Long.  Marland Kitchen PAU D ARCO PO Take 1 tablet  by mouth Long.  . Probiotic CAPS Take 1 capsule by mouth Long.   . vitamin C (ASCORBIC ACID) 500 MG tablet Take 500 mg by mouth every evening.   . [DISCONTINUED] Cholecalciferol 2000 units TABS Take 6,000 Units by mouth Long.  . [DISCONTINUED] cyanocobalamin 500 MCG tablet Take 500 mcg by mouth Long.  . [DISCONTINUED] milk thistle 175 MG tablet Take 175 mg by mouth Long.  . [DISCONTINUED] Multiple Vitamins-Minerals (HAIR SKIN AND NAILS FORMULA) TABS Take 2 tablets by mouth Long. 2500 mcg per serving  . [DISCONTINUED] naproxen sodium (ALEVE) 220 MG tablet Take 220 mg by mouth Long as needed (pain).  . [DISCONTINUED] Selenium 100 MCG TABS Take 100 mcg by mouth  Long.      Allergies:   Patient has no known allergies.   Social History   Tobacco Use  . Smoking status: Never Smoker  . Smokeless tobacco: Never Used  Substance Use Topics  . Alcohol use: No  . Drug use: No     Family Hx: The patient's family history includes Aneurysm in her father; Heart attack in her mother; Stroke in her mother. There is no history of Colon cancer.  ROS:   Please see the history of present illness.     All other systems reviewed and are negative.   Prior CV studies:   The following studies were reviewed today:  Echocardiogram: 10/2012 Study Conclusions   - Left ventricle: The cavity size was normal. Wall thickness  was normal. Systolic function was normal. The estimated  ejection fraction was in the range of 55% to 60%. Wall  motion was normal; there were no regional wall motion  abnormalities. Left ventricular diastolic function  parameters were normal.  - Mitral valve: Mild regurgitation. Valve area by pressure  half-time: 2.5cm^2.   NST: 02/2016  Blood pressure demonstrated a hypertensive response to exercise.  The study is normal.  This is a low risk study.  Nuclear stress EF: 80%.  Labs/Other Tests and Data Reviewed:    EKG:  No ECG reviewed.  Recent Labs: 10/04/2018: ALT 23; BUN 14; Creatinine, Ser 0.70; Hemoglobin 13.5; Platelets 137; Potassium 4.4; Sodium 141   Recent Lipid Panel No results found for: CHOL, TRIG, HDL, CHOLHDL, LDLCALC, LDLDIRECT  Wt Readings from Last 3 Encounters:  11/29/18 156 lb (70.8 kg)  10/12/18 154 lb 8 oz (70.1 kg)  09/14/18 156 lb (70.8 kg)     Objective:    Vital Signs:  BP 138/87   Pulse 68   Ht 5' 5.5" (1.664 m)   Wt 156 lb (70.8 kg)   BMI 25.56 kg/m    General: Pleasant female appearing in NAD. Psych: Normal affect. Neuro: Alert and oriented X 3. Moves all extremities spontaneously. HEENT: Normal in appearance.   Lungs:  Resp appear regular and unlabored.    ASSESSMENT & PLAN:    1. Palpitations - She reports intermittent palpitations occurring for the past 3 months which improved after she reduced her caffeine consumption. She is still experiencing intermittent fluttering as outlined above which occurs on a Long basis. Denies any associated symptoms. - Recent labs were obtained by her PCP and we will request these records. Hgb and electrolytes WNL when checked in 10/2018. - given the timeframe since her last monitor, will plan for a 2-week cardiac event monitor to rule-out any significant arrhythmias. If noted to have PAC's and PVC's, would consider low-dose Lopressor 12.5mg  BID to see if this helps with her symptoms. Recommended to continue  to limit caffeine consumption.   2. History of Colon Cancer - s/p right colon segmental resection in 06/2017. Followed by Oncology and GI.    COVID-19 Education: The signs and symptoms of COVID-19 were discussed with the patient and how to seek care for testing (follow up with PCP or arrange E-visit).  The importance of social distancing was discussed today.  Time:   Today, I have spent 18 minutes with the patient with telehealth technology discussing the above problems.     Medication Adjustments/Labs and Tests Ordered: Current medicines are reviewed at length with the patient today.  Concerns regarding medicines are outlined above.   Tests Ordered: Orders Placed This Encounter  Procedures  . CARDIAC EVENT MONITOR    Medication Changes: No orders of the defined types were placed in this encounter.   Follow Up:  In Person in 3 month(s)  Signed, Erma Heritage, PA-C  11/29/2018 4:41 PM    [facility]

## 2018-11-30 ENCOUNTER — Ambulatory Visit (INDEPENDENT_AMBULATORY_CARE_PROVIDER_SITE_OTHER): Payer: Medicare Other

## 2018-11-30 ENCOUNTER — Other Ambulatory Visit: Payer: Self-pay

## 2018-11-30 DIAGNOSIS — R002 Palpitations: Secondary | ICD-10-CM | POA: Diagnosis not present

## 2018-11-30 NOTE — Telephone Encounter (Addendum)
I called patient and she is aware. Called endo and LMOVM to cancel procedure

## 2018-11-30 NOTE — Telephone Encounter (Signed)
Yes.  I think we should put the elective procedure off until palpitations thoroughly worked up.

## 2018-12-02 ENCOUNTER — Telehealth: Payer: Self-pay | Admitting: Student

## 2018-12-02 ENCOUNTER — Other Ambulatory Visit (HOSPITAL_COMMUNITY)
Admission: RE | Admit: 2018-12-02 | Discharge: 2018-12-02 | Disposition: A | Discharge status: Home / Self Care | Payer: Medicare Other | Source: Ambulatory Visit | Attending: Internal Medicine | Admitting: Internal Medicine

## 2018-12-02 NOTE — Telephone Encounter (Signed)
Returned pt call. No answer. Left msg to call back.  

## 2018-12-02 NOTE — Telephone Encounter (Signed)
New Message    Patient called in this morning and wants to know is it ok for her to continue going for her walks with the monitor on? Or should she sustain until she is done wearing monitor and it cools off some?

## 2018-12-02 NOTE — Telephone Encounter (Signed)
Pt notified that she could do her normal activities

## 2018-12-02 NOTE — Telephone Encounter (Signed)
Follow up   Patient returned your call

## 2018-12-06 ENCOUNTER — Ambulatory Visit (HOSPITAL_COMMUNITY): Admission: RE | Admit: 2018-12-06 | Payer: Medicare Other | Source: Home / Self Care | Admitting: Internal Medicine

## 2018-12-06 ENCOUNTER — Encounter (HOSPITAL_COMMUNITY): Admission: RE | Payer: Self-pay | Source: Home / Self Care

## 2018-12-06 SURGERY — COLONOSCOPY
Anesthesia: Moderate Sedation

## 2018-12-07 ENCOUNTER — Telehealth: Payer: Self-pay | Admitting: Cardiovascular Disease

## 2018-12-07 NOTE — Telephone Encounter (Signed)
Returned pt call. She wanted to make sure it was ok to keep same patch on. Let her know it was fine.

## 2018-12-07 NOTE — Telephone Encounter (Signed)
Has questions about her monitor.

## 2018-12-20 ENCOUNTER — Telehealth: Payer: Self-pay | Admitting: Cardiovascular Disease

## 2018-12-20 ENCOUNTER — Telehealth: Payer: Self-pay | Admitting: *Deleted

## 2018-12-20 MED ORDER — METOPROLOL SUCCINATE ER 25 MG PO TB24: 12.5000 mg | ORAL_TABLET | Freq: Every day | ORAL | 3 refills | Status: DC

## 2018-12-20 NOTE — Telephone Encounter (Signed)
End of service received today, not yet read by MD, LM for patient that we will call when read

## 2018-12-20 NOTE — Telephone Encounter (Signed)
-----   Message from Erma Heritage, [place] sent at 12/20/2018 10:53 AM EDT ----- Please let the patient know her event monitor showed normal sinus rhythm. No significant arrhythmias which means she can proceed with planned colonoscopy. She did have premature ventricular contractions which we reviewed at the time of her office visit. Continue to limit caffeine intake. If still having symptoms, would recommend starting Toprol-XL 12.5mg  daily. Can titrate to an entire tablet of 25mg  daily if still having symptoms after one week. Please forward a copy of results to Sharilyn Sites, MD.

## 2018-12-20 NOTE — Telephone Encounter (Signed)
CALLED FOR MONITOR RESULTS

## 2018-12-27 ENCOUNTER — Telehealth: Payer: Self-pay

## 2018-12-27 NOTE — Telephone Encounter (Signed)
We can go ahead and get a date set up with patient for tcs/egd as per original orders.   I think we should try to bring her in before her ov just because we haven't seen her now since 07/2018.   Let me know if we can coordinate, I don't want to delay her too much because she is overdue given h/o colon cancer.

## 2018-12-27 NOTE — Telephone Encounter (Signed)
Pt called office, she was seen by cardiology for palpitations. States she is still having some "fluttering". Started Metoprolol 25mg  1/2 tablet daily. Cardiology told her she can reschedule TCS/EGD. Magda Paganini, please advise if pt can reschedule procedure.

## 2018-12-28 NOTE — Telephone Encounter (Signed)
Pt called office, holding spot for TCS/EGD w/RMR 02/22/19 at 9:30am. OV w/LSL 02/06/19.

## 2018-12-28 NOTE — Telephone Encounter (Signed)
Tried to call pt, no answer, LMOVM for return call.  

## 2018-12-28 NOTE — Telephone Encounter (Signed)
Called pt, agreed to have TCS/EGD sooner. Holding spot for procedure 01/18/19 at 2:00pm. OV w/LSL 01/12/19 at 1:30 per LSL to update H&P.

## 2018-12-28 NOTE — Telephone Encounter (Signed)
Spoke with Tretha Sciara about trying to move patient's appointment up as her colonoscopy is already six months overdue (h/o colon cancer).   Please let patient know I would prefer her to have her colonoscopy/egd done sooner if she is willing to take afternoon time slot for the sooner date that is available.

## 2019-01-06 ENCOUNTER — Telehealth: Payer: Self-pay | Admitting: Cardiovascular Disease

## 2019-01-06 NOTE — Telephone Encounter (Signed)
Please give pt a call-- she has some questions regarding her medications

## 2019-01-06 NOTE — Telephone Encounter (Signed)
Patient needed clarification that metoprolol was not a statin

## 2019-01-11 ENCOUNTER — Inpatient Hospital Stay (HOSPITAL_COMMUNITY): Payer: Medicare Other | Attending: Hematology

## 2019-01-11 ENCOUNTER — Other Ambulatory Visit: Payer: Self-pay

## 2019-01-11 ENCOUNTER — Ambulatory Visit (HOSPITAL_COMMUNITY)
Admission: RE | Admit: 2019-01-11 | Discharge: 2019-01-11 | Disposition: A | Discharge status: Home / Self Care | Payer: Medicare Other | Source: Ambulatory Visit | Attending: Hematology | Admitting: Hematology

## 2019-01-11 DIAGNOSIS — C189 Malignant neoplasm of colon, unspecified: Secondary | ICD-10-CM | POA: Diagnosis not present

## 2019-01-11 DIAGNOSIS — N200 Calculus of kidney: Secondary | ICD-10-CM | POA: Diagnosis not present

## 2019-01-11 DIAGNOSIS — C183 Malignant neoplasm of hepatic flexure: Secondary | ICD-10-CM | POA: Insufficient documentation

## 2019-01-11 DIAGNOSIS — K7460 Unspecified cirrhosis of liver: Secondary | ICD-10-CM | POA: Diagnosis not present

## 2019-01-11 DIAGNOSIS — Z8249 Family history of ischemic heart disease and other diseases of the circulatory system: Secondary | ICD-10-CM | POA: Diagnosis not present

## 2019-01-11 DIAGNOSIS — R197 Diarrhea, unspecified: Secondary | ICD-10-CM | POA: Diagnosis not present

## 2019-01-11 DIAGNOSIS — Z79899 Other long term (current) drug therapy: Secondary | ICD-10-CM | POA: Diagnosis not present

## 2019-01-11 DIAGNOSIS — Z85038 Personal history of other malignant neoplasm of large intestine: Secondary | ICD-10-CM

## 2019-01-11 LAB — CBC WITH DIFFERENTIAL/PLATELET
Abs Immature Granulocytes: 0.01 10*3/uL (ref 0.00–0.07)
Basophils Absolute: 0.1 10*3/uL (ref 0.0–0.1)
Basophils Relative: 1 %
Eosinophils Absolute: 0.1 10*3/uL (ref 0.0–0.5)
Eosinophils Relative: 3 %
HCT: 42.8 % (ref 36.0–46.0)
Hemoglobin: 13.9 g/dL (ref 12.0–15.0)
Immature Granulocytes: 0 %
Lymphocytes Relative: 41 %
Lymphs Abs: 1.7 10*3/uL (ref 0.7–4.0)
MCH: 30.3 pg (ref 26.0–34.0)
MCHC: 32.5 g/dL (ref 30.0–36.0)
MCV: 93.2 fL (ref 80.0–100.0)
Monocytes Absolute: 0.4 10*3/uL (ref 0.1–1.0)
Monocytes Relative: 11 %
Neutro Abs: 1.8 10*3/uL (ref 1.7–7.7)
Neutrophils Relative %: 44 %
Platelets: 156 10*3/uL (ref 150–400)
RBC: 4.59 MIL/uL (ref 3.87–5.11)
RDW: 13.2 % (ref 11.5–15.5)
WBC: 4.1 10*3/uL (ref 4.0–10.5)
nRBC: 0 % (ref 0.0–0.2)

## 2019-01-11 LAB — COMPREHENSIVE METABOLIC PANEL
ALT: 25 U/L (ref 0–44)
AST: 26 U/L (ref 15–41)
Albumin: 4.7 g/dL (ref 3.5–5.0)
Alkaline Phosphatase: 78 U/L (ref 38–126)
Anion gap: 9 (ref 5–15)
BUN: 16 mg/dL (ref 8–23)
CO2: 26 mmol/L (ref 22–32)
Calcium: 9.6 mg/dL (ref 8.9–10.3)
Chloride: 105 mmol/L (ref 98–111)
Creatinine, Ser: 0.76 mg/dL (ref 0.44–1.00)
GFR calc Af Amer: >60 mL/min (ref >60)
GFR calc non Af Amer: >60 mL/min (ref >60)
Glucose, Bld: 100 mg/dL — ABNORMAL HIGH (ref 70–99)
Potassium: 4 mmol/L (ref 3.5–5.1)
Sodium: 140 mmol/L (ref 135–145)
Total Bilirubin: 0.6 mg/dL (ref 0.3–1.2)
Total Protein: 8.2 g/dL — ABNORMAL HIGH (ref 6.5–8.1)

## 2019-01-11 LAB — POCT I-STAT CREATININE: Creatinine, Ser: 0.8 mg/dL (ref 0.44–1.00)

## 2019-01-11 LAB — PROTIME-INR
INR: 1 (ref 0.8–1.2)
Prothrombin Time: 13 seconds (ref 11.4–15.2)

## 2019-01-11 MED ORDER — IOHEXOL 300 MG/ML  SOLN
100.0000 mL | Freq: Once | INTRAMUSCULAR | Status: AC | PRN
  Administered 2019-01-11: 10:00:00 100 mL via INTRAVENOUS

## 2019-01-12 ENCOUNTER — Encounter: Payer: Self-pay | Admitting: Gastroenterology

## 2019-01-12 ENCOUNTER — Ambulatory Visit (INDEPENDENT_AMBULATORY_CARE_PROVIDER_SITE_OTHER): Payer: Medicare Other | Admitting: Gastroenterology

## 2019-01-12 ENCOUNTER — Other Ambulatory Visit: Payer: Self-pay

## 2019-01-12 ENCOUNTER — Other Ambulatory Visit: Payer: Self-pay | Admitting: *Deleted

## 2019-01-12 ENCOUNTER — Encounter: Payer: Self-pay | Admitting: *Deleted

## 2019-01-12 VITALS — BP 114/72 | HR 85 | Temp 96.6°F | Ht 65.5 in | Wt 156.4 lb

## 2019-01-12 DIAGNOSIS — C183 Malignant neoplasm of hepatic flexure: Secondary | ICD-10-CM | POA: Diagnosis not present

## 2019-01-12 DIAGNOSIS — K7460 Unspecified cirrhosis of liver: Secondary | ICD-10-CM | POA: Diagnosis not present

## 2019-01-12 DIAGNOSIS — Z85038 Personal history of other malignant neoplasm of large intestine: Secondary | ICD-10-CM

## 2019-01-12 LAB — CEA: CEA: 8 ng/mL — ABNORMAL HIGH (ref 0.0–4.7)

## 2019-01-12 NOTE — H&P (View-Only) (Signed)
Primary Care Physician:  Sharilyn Sites, MD  Primary Gastroenterologist:  Garfield Cornea, MD   Chief Complaint  Patient presents with  . update H&P for procedure    HPI:  Ariana Long is a 63 y.o. female here to reschedule EGD and colonoscopy.  Procedures canceled for history of palpitations.  She has since followed up with cardiology, wore heart monitor for 2 weeks without any significant abnormalities.  Patient has been cleared for procedures.    Patient last seen in March.  She has a history of hepatitis C, status post eradication.  Prior MRI in 2015 with nodular liver border, F3/F4 on FibroSure.  EGD 2017 was negative for esophageal varices.  She is due for surveillance EGD at this time.  History of stage II colon cancer at the hepatic flexure diagnosed January 2019, status post right colon segmental resection in February 2019.  CEA the month after surgery was elevated at 6.3.  CT of the chest did not show any metastatic disease.  PET CT April 2019 unremarkable.?  CEA elevation from her liver disease.  CT abdomen pelvis October 2019 with postoperative changes of partial right colectomy without definitive evidence of local or metastatic disease.  Mild narrowing in the region of the hepatic flexure, presumably at surgical anastomosis.  CEA 8.9 in January 2020.  Up from 7.7 three months prior.  PET scan February 2020 showing asymmetric tonsillar activity with the left palatine tonsil.  Anal and perineal activity likely incidental.  No findings of recurrent/metastatic colon cancer.  Patient due for 1 year surveillance colonoscopy.  Patient with evidence of early cirrhosis on prior imaging.  PET scan in April 2019 suggestive of shrunken appearance of the liver, nodular contour suggesting cirrhosis.  2015 showed shrunken appearance of the liver, nodular contour compatible with cirrhosis.  She feels well.  Occasionally with bright red blood per rectum if she passes a hard stool.  Previously Benefiber  seem to cause abdominal pain.  She does not like psyllium fiber.  She denies black stools.  No heartburn, vomiting, dysphagia, weight loss. Currently BM 2-3 per day.   CT abdomen pelvis with contrast yesterday under the direction of Dr. Delton Coombes.  No hepatoma.  Right Hemicolectomy.  No evidence of recurrent or metastatic disease.  Nonobstructive right nephrolithiasis.  Labs yesterday, CEA 8.0, LFTs normal, INR 1, CBC normal. MELD 6.      Current Outpatient Medications  Medication Sig Dispense Refill  . CALCIUM-MAGNESIUM PO Take 1 tablet by mouth every evening.     . Cholecalciferol (VITAMIN D3) 50 MCG (2000 UT) TABS Take 6,000 Units by mouth daily.    . clobetasol ointment (TEMOVATE) 0.05 % Use 2-3 x weekly (Patient taking differently: Apply 1 application topically daily as needed (lichen planus). ) 30 g 2  . fluticasone (CUTIVATE) 0.05 % cream Apply 1 application topically 2 (two) times daily as needed (lichen planus).    . fluticasone (FLONASE) 50 MCG/ACT nasal spray Place 1 spray into both nostrils daily as needed for allergies or rhinitis.    . hydrocortisone (ANUSOL-HC) 25 MG suppository Place 1 suppository (25 mg total) rectally every 12 (twelve) hours. (Patient taking differently: Place 25 mg rectally 2 (two) times daily as needed for hemorrhoids. ) 12 suppository 1  . Milk Thistle 250 MG CAPS Take 250 mg by mouth daily.    . Multiple Vitamin (MULTIVITAMIN WITH MINERALS) TABS tablet Take 1 tablet by mouth every other day. In the morning.    . Multiple Vitamins-Minerals (HAIR/SKIN/NAILS/BIOTIN  PO) Take 1 tablet by mouth daily.    . naproxen sodium (ALEVE) 220 MG tablet Take 220 mg by mouth 2 (two) times daily as needed (pain.).    Marland Kitchen OLIVE LEAF EXTRACT PO Take 1 tablet by mouth daily.    . Omega-3 Fatty Acids (FISH OIL) 1000 MG CAPS Take 1,000 mg by mouth daily.    Marland Kitchen PAU D ARCO PO Take 1 tablet by mouth daily.    . Probiotic CAPS Take 1 capsule by mouth daily.     Marland Kitchen VITAMIN A PO Take by  mouth daily.    . vitamin B-12 (CYANOCOBALAMIN) 1000 MCG tablet Take 1,000 mcg by mouth daily.    . vitamin C (ASCORBIC ACID) 500 MG tablet Take 500 mg by mouth every evening.     . zinc gluconate 50 MG tablet Take 50 mg by mouth daily.      No current facility-administered medications for this visit.     Allergies as of 01/12/2019  . (No Known Allergies)    Past Medical History:  Diagnosis Date  . Cirrhosis of liver ([place]) 01/26/2011   FIBROSIS on liver biopsy in 2007, U/S  03/31/2013= mild diffuse hepatic steatosis and /or hepatocellular disease without focal hepatic parenchymal abnormality. per Dr. Patsy [place] (2012) pt is immune to hep A and Hep B. per 12/06/13 note from [facility] clinic-pt had MRI which showed cirrhosis 2015.  . Diverticulitis of colon   . Elevated AFP 01/26/2011  . Gall stones   . Hematuria 12/12/2014  . Hemorrhoid 12/01/2012  . Hemorrhoids   . Hepatitis C 01/26/2011   2005 non responder, genotype 1, Gr 1-2, Stage 3 . Treated with Harvoni at the Liver clinic, responder  . Hidradenitis   . History of hiatal hernia   . Hx: UTI (urinary tract infection)   . Kidney stones 10/2011  . Osteoporosis   . Primary cancer of hepatic flexure of colon ([place]) 06/15/2017  . Serrated adenoma of colon 11/2011  . Splenomegaly 01/26/2011  . Thrombocytopenia ([place]) 01/26/2011  . Tubular adenoma   . Vulval lesion 12/14/2013   Has kissing lesions thin and puffy and paler i color about 5-6- mm in diameter.Dr Glo Herring in to co examine will try temovate and recheck in 3 months    Past Surgical History:  Procedure Laterality Date  . AXILLARY SURGERY Bilateral 1979   "glands removed"  . BREAST BIOPSY Right 2011  . BREAST EXCISIONAL BIOPSY Bilateral 25 years ago   Benign, fatty tumors  . CESAREAN SECTION     x 2  . COLECTOMY  06/15/2017   LAPAROSCOPIC ASSISTED RIGHT COLECTOMY, ERAS PATHWAY/notes 06/15/2017  . COLONOSCOPY  07/31/2005   [place]-Normal rectum/Normal colonoscopy/Repeat screening  colonoscopy ten years  . COLONOSCOPY  11/16/2011   RMR: Friable anorectum-likely source of hematochezia/ LARGE 1.2x2.5CM SERRATED ADENOMA resected from ascending colon 7CM DISTAL TO ICV, hyperplastic polyp removed   . COLONOSCOPY N/A 07/18/2012   Dr. Gala Romney- normal appearing rectal mucosa. no residual polpy tissue seen at tatoo location. remainder of colonic mucosa appeared entirely normal. bx = tubular adenoma  . COLONOSCOPY N/A 08/29/2015   Dr. Gala Romney: single 12 mm polyp in ascending colon, 4 mm polyp ascending. Sessile serrated adenoma. 3 year surveillance  . COLONOSCOPY N/A 05/19/2017   4X2 cm sessile lesion at hepatic flexdure, unable to be completely removed  . ESOPHAGEAL BANDING N/A 08/29/2015   Procedure: ESOPHAGEAL BANDING;  Surgeon: Daneil Dolin, MD;  [facility];  Service: Endoscopy;  Laterality:  N/A;  . ESOPHAGOGASTRODUODENOSCOPY N/A 08/29/2015   Dr. Gala Romney: non-critical Schatzki's ring, somewhat prominent small submucosal vessels without obvious varices. hyperplastic gastric polyp. 2 year surveillance  . HEMORRHOID BANDING    . LAPAROSCOPIC RIGHT COLECTOMY N/A 06/15/2017   Procedure: LAPAROSCOPIC ASSISTED RIGHT COLECTOMY, ERAS PATHWAY;  Surgeon: Fanny Skates, MD;  Location: [facility];  Service: General;  Laterality: N/A;  . POLYPECTOMY  05/19/2017   Procedure: POLYPECTOMY;  Surgeon: Daneil Dolin, MD;  Location: [facility];  Service: Endoscopy;;  . SKIN LESION EXCISION      Family History  Problem Relation Age of Onset  . Heart attack Mother   . Stroke Mother   . Aneurysm Father   . Colon cancer Neg Hx     Social History   Socioeconomic History  . Marital status: Married    Spouse name: Not on file  . Number of children: 2  . Years of education: Not on file  . Highest education level: Not on file  Occupational History  . Occupation: Occupational psychologist: [employer]  . Financial resource strain: Not hard at all  . Food insecurity     Worry: Never true    Inability: Never true  . Transportation needs    Medical: No    Non-medical: No  Tobacco Use  . Smoking status: Never Smoker  . Smokeless tobacco: Never Used  Substance and Sexual Activity  . Alcohol use: No  . Drug use: No  . Sexual activity: Yes    Birth control/protection: Post-menopausal  Lifestyle  . Physical activity    Days per week: 3 days    Minutes per session: 30 min  . Stress: Not at all  Relationships  . Social connections    Talks on phone: More than three times a week    Gets together: More than three times a week    Attends religious service: More than 4 times per year    Active member of club or organization: Yes    Attends meetings of clubs or organizations: More than 4 times per year    Relationship status: Married  . Intimate partner violence    Fear of current or ex partner: No    Emotionally abused: No    Physically abused: No    Forced sexual activity: No  Other Topics Concern  . Not on file  Social History Narrative  . Not on file      ROS:  General: Negative for anorexia, weight loss, fever, chills, fatigue, weakness. Eyes: Negative for vision changes.  ENT: Negative for hoarseness, difficulty swallowing , nasal congestion. CV: Negative for chest pain, angina, palpitations, dyspnea on exertion, peripheral edema.  Respiratory: Negative for dyspnea at rest, dyspnea on exertion, cough, sputum, wheezing.  GI: See history of present illness. GU:  Negative for dysuria, hematuria, urinary incontinence, urinary frequency, nocturnal urination.  MS: Negative for joint pain, low back pain.  Derm: Negative for rash or itching.  Neuro: Negative for weakness, abnormal sensation, seizure, frequent headaches, memory loss, confusion.  Psych: Negative for anxiety, depression, suicidal ideation, hallucinations.  Endo: Negative for unusual weight change.  Heme: Negative for bruising or bleeding. Allergy: Negative for rash or hives.     Physical Examination:  BP 114/72   Pulse 85   Temp (!) 96.6 F (35.9 C) (Temporal)   Ht 5' 5.5" (1.664 m)   Wt 156 lb 6.4 oz (70.9 kg)   BMI 25.63 kg/m  General: Well-nourished, well-developed in no acute distress.  Head: Normocephalic, atraumatic.   Eyes: Conjunctiva pink, no icterus. Mouth: Oropharyngeal mucosa moist and pink , no lesions erythema or exudate. Neck: Supple without thyromegaly, masses, or lymphadenopathy.  Lungs: Clear to auscultation bilaterally.  Heart: Regular rate and rhythm, no murmurs rubs or gallops.  Abdomen: Bowel sounds are normal, nontender, nondistended, no hepatosplenomegaly or masses, no abdominal bruits or    hernia , no rebound or guarding.   Rectal: not performed Extremities: No lower extremity edema. No clubbing or deformities.  Neuro: Alert and oriented x 4 , grossly normal neurologically.  Skin: Warm and dry, no rash or jaundice.   Psych: Alert and cooperative, normal mood and affect.  Labs: Lab Results  Component Value Date   CREATININE 0.80 01/11/2019   BUN 16 01/11/2019   NA 140 01/11/2019   K 4.0 01/11/2019   CL 105 01/11/2019   CO2 26 01/11/2019   Lab Results  Component Value Date   ALT 25 01/11/2019   AST 26 01/11/2019   ALKPHOS 78 01/11/2019   BILITOT 0.6 01/11/2019   Lab Results  Component Value Date   WBC 4.1 01/11/2019   HGB 13.9 01/11/2019   HCT 42.8 01/11/2019   MCV 93.2 01/11/2019   PLT 156 01/11/2019   Lab Results  Component Value Date   INR 1.0 01/11/2019   INR 1.02 06/14/2017   INR 1.01 12/25/2010     Imaging Studies: Ct Abdomen Pelvis W Contrast  Result Date: 01/11/2019 CLINICAL DATA:  Colon cancer, status post right colectomy EXAM: CT ABDOMEN AND PELVIS WITH CONTRAST TECHNIQUE: Multidetector CT imaging of the abdomen and pelvis was performed using the standard protocol following bolus administration of intravenous contrast. CONTRAST:  182mL OMNIPAQUE IOHEXOL 300 MG/ML SOLN, additional oral enteric  contrast COMPARISON:  02/23/2018, 06/08/2017 FINDINGS: Lower chest: No acute abnormality. Bibasilar scarring and/or partial atelectasis. Hepatobiliary: No solid liver abnormality is seen. No gallstones, gallbladder wall thickening, or biliary dilatation. Pancreas: Unremarkable. No pancreatic ductal dilatation or surrounding inflammatory changes. Spleen: Normal in size without significant abnormality. Adrenals/Urinary Tract: Adrenal glands are unremarkable. Nonobstructive superior pole calculus of the right kidney. Kidneys are otherwise normal, without renal calculi, solid lesion, or hydronephrosis. Bladder is unremarkable. Stomach/Bowel: Stomach is within normal limits. Redemonstrated postoperative findings of right hemicolectomy. No evidence of bowel wall thickening, distention, or inflammatory changes. Vascular/Lymphatic: No significant vascular findings are present. No enlarged abdominal or pelvic lymph nodes. Reproductive: No mass or other significant abnormality. Other: No abdominal wall hernia or abnormality. No abdominopelvic ascites. Musculoskeletal: No acute or significant osseous findings. IMPRESSION: 1. Redemonstrated postoperative findings of right hemicolectomy. No evidence of recurrent or metastatic disease in the abdomen or pelvis. 2.  Nonobstructive right nephrolithiasis. Electronically Signed   By: Eddie Candle M.D.   On: 01/11/2019 10:38

## 2019-01-12 NOTE — Progress Notes (Signed)
Primary Care Physician:  Sharilyn Sites, MD  Primary Gastroenterologist:  Garfield Cornea, MD   Chief Complaint  Patient presents with  . update H&P for procedure    HPI:  Ariana Long is a 63 y.o. female here to reschedule EGD and colonoscopy.  Procedures canceled for history of palpitations.  She has since followed up with cardiology, wore heart monitor for 2 weeks without any significant abnormalities.  Patient has been cleared for procedures.    Patient last seen in March.  She has a history of hepatitis C, status post eradication.  Prior MRI in 2015 with nodular liver border, F3/F4 on FibroSure.  EGD 2017 was negative for esophageal varices.  She is due for surveillance EGD at this time.  History of stage II colon cancer at the hepatic flexure diagnosed January 2019, status post right colon segmental resection in February 2019.  CEA the month after surgery was elevated at 6.3.  CT of the chest did not show any metastatic disease.  PET CT April 2019 unremarkable.?  CEA elevation from her liver disease.  CT abdomen pelvis October 2019 with postoperative changes of partial right colectomy without definitive evidence of local or metastatic disease.  Mild narrowing in the region of the hepatic flexure, presumably at surgical anastomosis.  CEA 8.9 in January 2020.  Up from 7.7 three months prior.  PET scan February 2020 showing asymmetric tonsillar activity with the left palatine tonsil.  Anal and perineal activity likely incidental.  No findings of recurrent/metastatic colon cancer.  Patient due for 1 year surveillance colonoscopy.  Patient with evidence of early cirrhosis on prior imaging.  PET scan in April 2019 suggestive of shrunken appearance of the liver, nodular contour suggesting cirrhosis.  2015 showed shrunken appearance of the liver, nodular contour compatible with cirrhosis.  She feels well.  Occasionally with bright red blood per rectum if she passes a hard stool.  Previously Benefiber  seem to cause abdominal pain.  She does not like psyllium fiber.  She denies black stools.  No heartburn, vomiting, dysphagia, weight loss. Currently BM 2-3 per day.   CT abdomen pelvis with contrast yesterday under the direction of Dr. Delton Coombes.  No hepatoma.  Right Hemicolectomy.  No evidence of recurrent or metastatic disease.  Nonobstructive right nephrolithiasis.  Labs yesterday, CEA 8.0, LFTs normal, INR 1, CBC normal. MELD 6.      Current Outpatient Medications  Medication Sig Dispense Refill  . CALCIUM-MAGNESIUM PO Take 1 tablet by mouth every evening.     . Cholecalciferol (VITAMIN D3) 50 MCG (2000 UT) TABS Take 6,000 Units by mouth daily.    . clobetasol ointment (TEMOVATE) 0.05 % Use 2-3 x weekly (Patient taking differently: Apply 1 application topically daily as needed (lichen planus). ) 30 g 2  . fluticasone (CUTIVATE) 0.05 % cream Apply 1 application topically 2 (two) times daily as needed (lichen planus).    . fluticasone (FLONASE) 50 MCG/ACT nasal spray Place 1 spray into both nostrils daily as needed for allergies or rhinitis.    . hydrocortisone (ANUSOL-HC) 25 MG suppository Place 1 suppository (25 mg total) rectally every 12 (twelve) hours. (Patient taking differently: Place 25 mg rectally 2 (two) times daily as needed for hemorrhoids. ) 12 suppository 1  . Milk Thistle 250 MG CAPS Take 250 mg by mouth daily.    . Multiple Vitamin (MULTIVITAMIN WITH MINERALS) TABS tablet Take 1 tablet by mouth every other day. In the morning.    . Multiple Vitamins-Minerals (HAIR/SKIN/NAILS/BIOTIN  PO) Take 1 tablet by mouth daily.    . naproxen sodium (ALEVE) 220 MG tablet Take 220 mg by mouth 2 (two) times daily as needed (pain.).    Marland Kitchen OLIVE LEAF EXTRACT PO Take 1 tablet by mouth daily.    . Omega-3 Fatty Acids (FISH OIL) 1000 MG CAPS Take 1,000 mg by mouth daily.    Marland Kitchen PAU D ARCO PO Take 1 tablet by mouth daily.    . Probiotic CAPS Take 1 capsule by mouth daily.     Marland Kitchen VITAMIN A PO Take by  mouth daily.    . vitamin B-12 (CYANOCOBALAMIN) 1000 MCG tablet Take 1,000 mcg by mouth daily.    . vitamin C (ASCORBIC ACID) 500 MG tablet Take 500 mg by mouth every evening.     . zinc gluconate 50 MG tablet Take 50 mg by mouth daily.      No current facility-administered medications for this visit.     Allergies as of 01/12/2019  . (No Known Allergies)    Past Medical History:  Diagnosis Date  . Cirrhosis of liver ([place]) 01/26/2011   FIBROSIS on liver biopsy in 2007, U/S  03/31/2013= mild diffuse hepatic steatosis and /or hepatocellular disease without focal hepatic parenchymal abnormality. per Dr. Patsy Baltimore (2012) pt is immune to hep A and Hep B. per 12/06/13 note from [facility] clinic-pt had MRI which showed cirrhosis 2015.  . Diverticulitis of colon   . Elevated AFP 01/26/2011  . Gall stones   . Hematuria 12/12/2014  . Hemorrhoid 12/01/2012  . Hemorrhoids   . Hepatitis C 01/26/2011   2005 non responder, genotype 1, Gr 1-2, Stage 3 . Treated with Harvoni at the Liver clinic, responder  . Hidradenitis   . History of hiatal hernia   . Hx: UTI (urinary tract infection)   . Kidney stones 10/2011  . Osteoporosis   . Primary cancer of hepatic flexure of colon ([place]) 06/15/2017  . Serrated adenoma of colon 11/2011  . Splenomegaly 01/26/2011  . Thrombocytopenia ([place]) 01/26/2011  . Tubular adenoma   . Vulval lesion 12/14/2013   Has kissing lesions thin and puffy and paler i color about 5-6- mm in diameter.Dr Glo Herring in to co examine will try temovate and recheck in 3 months    Past Surgical History:  Procedure Laterality Date  . AXILLARY SURGERY Bilateral 1979   "glands removed"  . BREAST BIOPSY Right 2011  . BREAST EXCISIONAL BIOPSY Bilateral 25 years ago   Benign, fatty tumors  . CESAREAN SECTION     x 2  . COLECTOMY  06/15/2017   LAPAROSCOPIC ASSISTED RIGHT COLECTOMY, ERAS PATHWAY/notes 06/15/2017  . COLONOSCOPY  07/31/2005   [place]k-Normal rectum/Normal colonoscopy/Repeat screening  colonoscopy ten years  . COLONOSCOPY  11/16/2011   RMR: Friable anorectum-likely source of hematochezia/ LARGE 1.2x2.5CM SERRATED ADENOMA resected from ascending colon 7CM DISTAL TO ICV, hyperplastic polyp removed   . COLONOSCOPY N/A 07/18/2012   Dr. Gala Romney- normal appearing rectal mucosa. no residual polpy tissue seen at tatoo location. remainder of colonic mucosa appeared entirely normal. bx = tubular adenoma  . COLONOSCOPY N/A 08/29/2015   Dr. Gala Romney: single 12 mm polyp in ascending colon, 4 mm polyp ascending. Sessile serrated adenoma. 3 year surveillance  . COLONOSCOPY N/A 05/19/2017   4X2 cm sessile lesion at hepatic flexdure, unable to be completely removed  . ESOPHAGEAL BANDING N/A 08/29/2015   Procedure: ESOPHAGEAL BANDING;  Surgeon: Daneil Dolin, MD;  [facility];  Service: Endoscopy;  Laterality:  N/A;  . ESOPHAGOGASTRODUODENOSCOPY N/A 08/29/2015   Dr. Gala Romney: non-critical Schatzki's ring, somewhat prominent small submucosal vessels without obvious varices. hyperplastic gastric polyp. 2 year surveillance  . HEMORRHOID BANDING    . LAPAROSCOPIC RIGHT COLECTOMY N/A 06/15/2017   Procedure: LAPAROSCOPIC ASSISTED RIGHT COLECTOMY, ERAS PATHWAY;  Surgeon: Fanny Skates, MD;  Location: [facility];  Service: General;  Laterality: N/A;  . POLYPECTOMY  05/19/2017   Procedure: POLYPECTOMY;  Surgeon: Daneil Dolin, MD;  Location: [facility];  Service: Endoscopy;;  . SKIN LESION EXCISION      Family History  Problem Relation Age of Onset  . Heart attack Mother   . Stroke Mother   . Aneurysm Father   . Colon cancer Neg Hx     Social History   Socioeconomic History  . Marital status: Married    Spouse name: Not on file  . Number of children: 2  . Years of education: Not on file  . Highest education level: Not on file  Occupational History  . Occupation: Occupational psychologist: [employer]  . Financial resource strain: Not hard at all  . Food insecurity     Worry: Never true    Inability: Never true  . Transportation needs    Medical: No    Non-medical: No  Tobacco Use  . Smoking status: Never Smoker  . Smokeless tobacco: Never Used  Substance and Sexual Activity  . Alcohol use: No  . Drug use: No  . Sexual activity: Yes    Birth control/protection: Post-menopausal  Lifestyle  . Physical activity    Days per week: 3 days    Minutes per session: 30 min  . Stress: Not at all  Relationships  . Social connections    Talks on phone: More than three times a week    Gets together: More than three times a week    Attends religious service: More than 4 times per year    Active member of club or organization: Yes    Attends meetings of clubs or organizations: More than 4 times per year    Relationship status: Married  . Intimate partner violence    Fear of current or ex partner: No    Emotionally abused: No    Physically abused: No    Forced sexual activity: No  Other Topics Concern  . Not on file  Social History Narrative  . Not on file      ROS:  General: Negative for anorexia, weight loss, fever, chills, fatigue, weakness. Eyes: Negative for vision changes.  ENT: Negative for hoarseness, difficulty swallowing , nasal congestion. CV: Negative for chest pain, angina, palpitations, dyspnea on exertion, peripheral edema.  Respiratory: Negative for dyspnea at rest, dyspnea on exertion, cough, sputum, wheezing.  GI: See history of present illness. GU:  Negative for dysuria, hematuria, urinary incontinence, urinary frequency, nocturnal urination.  MS: Negative for joint pain, low back pain.  Derm: Negative for rash or itching.  Neuro: Negative for weakness, abnormal sensation, seizure, frequent headaches, memory loss, confusion.  Psych: Negative for anxiety, depression, suicidal ideation, hallucinations.  Endo: Negative for unusual weight change.  Heme: Negative for bruising or bleeding. Allergy: Negative for rash or hives.     Physical Examination:  BP 114/72   Pulse 85   Temp (!) 96.6 F (35.9 C) (Temporal)   Ht 5' 5.5" (1.664 m)   Wt 156 lb 6.4 oz (70.9 kg)   BMI 25.63 kg/m  General: Well-nourished, well-developed in no acute distress.  Head: Normocephalic, atraumatic.   Eyes: Conjunctiva pink, no icterus. Mouth: Oropharyngeal mucosa moist and pink , no lesions erythema or exudate. Neck: Supple without thyromegaly, masses, or lymphadenopathy.  Lungs: Clear to auscultation bilaterally.  Heart: Regular rate and rhythm, no murmurs rubs or gallops.  Abdomen: Bowel sounds are normal, nontender, nondistended, no hepatosplenomegaly or masses, no abdominal bruits or    hernia , no rebound or guarding.   Rectal: not performed Extremities: No lower extremity edema. No clubbing or deformities.  Neuro: Alert and oriented x 4 , grossly normal neurologically.  Skin: Warm and dry, no rash or jaundice.   Psych: Alert and cooperative, normal mood and affect.  Labs: Lab Results  Component Value Date   CREATININE 0.80 01/11/2019   BUN 16 01/11/2019   NA 140 01/11/2019   K 4.0 01/11/2019   CL 105 01/11/2019   CO2 26 01/11/2019   Lab Results  Component Value Date   ALT 25 01/11/2019   AST 26 01/11/2019   ALKPHOS 78 01/11/2019   BILITOT 0.6 01/11/2019   Lab Results  Component Value Date   WBC 4.1 01/11/2019   HGB 13.9 01/11/2019   HCT 42.8 01/11/2019   MCV 93.2 01/11/2019   PLT 156 01/11/2019   Lab Results  Component Value Date   INR 1.0 01/11/2019   INR 1.02 06/14/2017   INR 1.01 12/25/2010     Imaging Studies: Ct Abdomen Pelvis W Contrast  Result Date: 01/11/2019 CLINICAL DATA:  Colon cancer, status post right colectomy EXAM: CT ABDOMEN AND PELVIS WITH CONTRAST TECHNIQUE: Multidetector CT imaging of the abdomen and pelvis was performed using the standard protocol following bolus administration of intravenous contrast. CONTRAST:  160mL OMNIPAQUE IOHEXOL 300 MG/ML SOLN, additional oral enteric  contrast COMPARISON:  02/23/2018, 06/08/2017 FINDINGS: Lower chest: No acute abnormality. Bibasilar scarring and/or partial atelectasis. Hepatobiliary: No solid liver abnormality is seen. No gallstones, gallbladder wall thickening, or biliary dilatation. Pancreas: Unremarkable. No pancreatic ductal dilatation or surrounding inflammatory changes. Spleen: Normal in size without significant abnormality. Adrenals/Urinary Tract: Adrenal glands are unremarkable. Nonobstructive superior pole calculus of the right kidney. Kidneys are otherwise normal, without renal calculi, solid lesion, or hydronephrosis. Bladder is unremarkable. Stomach/Bowel: Stomach is within normal limits. Redemonstrated postoperative findings of right hemicolectomy. No evidence of bowel wall thickening, distention, or inflammatory changes. Vascular/Lymphatic: No significant vascular findings are present. No enlarged abdominal or pelvic lymph nodes. Reproductive: No mass or other significant abnormality. Other: No abdominal wall hernia or abnormality. No abdominopelvic ascites. Musculoskeletal: No acute or significant osseous findings. IMPRESSION: 1. Redemonstrated postoperative findings of right hemicolectomy. No evidence of recurrent or metastatic disease in the abdomen or pelvis. 2.  Nonobstructive right nephrolithiasis. Electronically Signed   By: Eddie Candle M.D.   On: 01/11/2019 10:38

## 2019-01-12 NOTE — Assessment & Plan Note (Signed)
History of hepatitis C status post eradication.  Previous imaging consistent with cirrhotic liver as previously outlined.  Current labs good, MELD 6.  No evidence of thrombocytopenia.  Planning for EGD to screen for varices.  I have discussed the risks, alternatives, benefits with regards to but not limited to the risk of reaction to medication, bleeding, infection, perforation and the patient is agreeable to proceed. Written consent to be obtained.  We will have her return in 6 months for follow-up cirrhosis care.

## 2019-01-12 NOTE — Assessment & Plan Note (Signed)
Overdue for 1 year surveillance colonoscopy.  I have discussed the risks, alternatives, benefits with regards to but not limited to the risk of reaction to medication, bleeding, infection, perforation and the patient is agreeable to proceed. Written consent to be obtained.

## 2019-01-12 NOTE — Patient Instructions (Signed)
1. Colonoscopy and upper endoscopy as scheduled. See separate instructions.  2. We will see you back in six months to follow up on liver issues.

## 2019-01-16 ENCOUNTER — Other Ambulatory Visit: Payer: Self-pay

## 2019-01-16 ENCOUNTER — Other Ambulatory Visit (HOSPITAL_COMMUNITY)
Admission: RE | Admit: 2019-01-16 | Discharge: 2019-01-16 | Disposition: A | Discharge status: Home / Self Care | Payer: Medicare Other | Source: Ambulatory Visit | Attending: Internal Medicine | Admitting: Internal Medicine

## 2019-01-16 DIAGNOSIS — Z20828 Contact with and (suspected) exposure to other viral communicable diseases: Secondary | ICD-10-CM | POA: Insufficient documentation

## 2019-01-16 LAB — SARS CORONAVIRUS 2 (TAT 6-24 HRS): SARS Coronavirus 2: NEGATIVE

## 2019-01-18 ENCOUNTER — Ambulatory Visit (HOSPITAL_COMMUNITY)
Admission: RE | Admit: 2019-01-18 | Discharge: 2019-01-18 | Disposition: A | Discharge status: Home / Self Care | Payer: Medicare Other | Attending: Internal Medicine | Admitting: Internal Medicine

## 2019-01-18 ENCOUNTER — Encounter (HOSPITAL_COMMUNITY)
Admission: RE | Disposition: A | Discharge status: Home / Self Care | Payer: Self-pay | Source: Home / Self Care | Attending: Internal Medicine

## 2019-01-18 ENCOUNTER — Other Ambulatory Visit: Payer: Self-pay

## 2019-01-18 ENCOUNTER — Encounter (HOSPITAL_COMMUNITY): Payer: Self-pay | Admitting: *Deleted

## 2019-01-18 ENCOUNTER — Ambulatory Visit (HOSPITAL_COMMUNITY): Payer: Medicare Other | Admitting: Hematology

## 2019-01-18 DIAGNOSIS — K635 Polyp of colon: Secondary | ICD-10-CM | POA: Diagnosis not present

## 2019-01-18 DIAGNOSIS — K760 Fatty (change of) liver, not elsewhere classified: Secondary | ICD-10-CM | POA: Insufficient documentation

## 2019-01-18 DIAGNOSIS — Z1211 Encounter for screening for malignant neoplasm of colon: Secondary | ICD-10-CM | POA: Diagnosis not present

## 2019-01-18 DIAGNOSIS — M810 Age-related osteoporosis without current pathological fracture: Secondary | ICD-10-CM | POA: Insufficient documentation

## 2019-01-18 DIAGNOSIS — D696 Thrombocytopenia, unspecified: Secondary | ICD-10-CM | POA: Insufficient documentation

## 2019-01-18 DIAGNOSIS — Z823 Family history of stroke: Secondary | ICD-10-CM | POA: Insufficient documentation

## 2019-01-18 DIAGNOSIS — Z85038 Personal history of other malignant neoplasm of large intestine: Secondary | ICD-10-CM | POA: Diagnosis not present

## 2019-01-18 DIAGNOSIS — R161 Splenomegaly, not elsewhere classified: Secondary | ICD-10-CM | POA: Insufficient documentation

## 2019-01-18 DIAGNOSIS — Z86010 Personal history of colonic polyps: Secondary | ICD-10-CM | POA: Insufficient documentation

## 2019-01-18 DIAGNOSIS — N200 Calculus of kidney: Secondary | ICD-10-CM | POA: Insufficient documentation

## 2019-01-18 DIAGNOSIS — K7460 Unspecified cirrhosis of liver: Secondary | ICD-10-CM | POA: Insufficient documentation

## 2019-01-18 DIAGNOSIS — K449 Diaphragmatic hernia without obstruction or gangrene: Secondary | ICD-10-CM | POA: Insufficient documentation

## 2019-01-18 DIAGNOSIS — Z8249 Family history of ischemic heart disease and other diseases of the circulatory system: Secondary | ICD-10-CM | POA: Insufficient documentation

## 2019-01-18 DIAGNOSIS — Z9049 Acquired absence of other specified parts of digestive tract: Secondary | ICD-10-CM | POA: Diagnosis not present

## 2019-01-18 DIAGNOSIS — Z79899 Other long term (current) drug therapy: Secondary | ICD-10-CM | POA: Insufficient documentation

## 2019-01-18 DIAGNOSIS — Z87442 Personal history of urinary calculi: Secondary | ICD-10-CM | POA: Insufficient documentation

## 2019-01-18 DIAGNOSIS — Z8619 Personal history of other infectious and parasitic diseases: Secondary | ICD-10-CM | POA: Diagnosis not present

## 2019-01-18 HISTORY — PX: COLONOSCOPY: SHX5424

## 2019-01-18 HISTORY — DX: Personal history of urinary calculi: Z87.442

## 2019-01-18 HISTORY — PX: POLYPECTOMY: SHX5525

## 2019-01-18 SURGERY — COLONOSCOPY
Anesthesia: Moderate Sedation

## 2019-01-18 MED ORDER — ONDANSETRON HCL 4 MG/2ML IJ SOLN
INTRAMUSCULAR | Status: DC | PRN
  Administered 2019-01-18: 4 mg via INTRAVENOUS

## 2019-01-18 MED ORDER — MEPERIDINE HCL 50 MG/ML IJ SOLN
INTRAMUSCULAR | Status: AC
  Filled 2019-01-18: qty 1

## 2019-01-18 MED ORDER — SODIUM CHLORIDE 0.9 % IV SOLN
INTRAVENOUS | Status: DC
  Administered 2019-01-18: 1000 mL via INTRAVENOUS

## 2019-01-18 MED ORDER — LIDOCAINE VISCOUS HCL 2 % MT SOLN
OROMUCOSAL | Status: AC
  Filled 2019-01-18: qty 15

## 2019-01-18 MED ORDER — MIDAZOLAM HCL 5 MG/5ML IJ SOLN
INTRAMUSCULAR | Status: AC
  Filled 2019-01-18: qty 10

## 2019-01-18 MED ORDER — MEPERIDINE HCL 100 MG/ML IJ SOLN
INTRAMUSCULAR | Status: DC | PRN
  Administered 2019-01-18: 25 mg via INTRAVENOUS
  Administered 2019-01-18: 15 mg via INTRAVENOUS

## 2019-01-18 MED ORDER — STERILE WATER FOR IRRIGATION IR SOLN
Status: DC | PRN
  Administered 2019-01-18: 1.5 mL

## 2019-01-18 MED ORDER — MIDAZOLAM HCL 5 MG/5ML IJ SOLN
INTRAMUSCULAR | Status: DC | PRN
  Administered 2019-01-18: 2 mg via INTRAVENOUS
  Administered 2019-01-18: 1 mg via INTRAVENOUS
  Administered 2019-01-18: 2 mg via INTRAVENOUS

## 2019-01-18 MED ORDER — ONDANSETRON HCL 4 MG/2ML IJ SOLN
INTRAMUSCULAR | Status: AC
  Filled 2019-01-18: qty 2

## 2019-01-18 NOTE — Op Note (Signed)
[facility] Patient Name: Ariana Long Procedure Date: 01/18/2019 12:56 PM MRN: [id number] Date of Birth: 25-Nov-1955 Attending MD: Norvel Richards , MD CSN: [id number] Age: 63 Admit Type: Outpatient Procedure:                Colonoscopy Indications:              High risk colon cancer surveillance: Personal                            history of colon cancer Providers:                Norvel Richards, MD, Charlsie Quest. Theda Sers RN, RN,                            Nelma Rothman, Technician Referring MD:              Medicines:                Midazolam 5 mg IV, Meperidine 40 mg IV, Ondansetron                            4 mg IV Complications:            No immediate complications. Estimated Blood Loss:     Estimated blood loss was minimal. Procedure:                After obtaining informed consent, the colonoscope                            was passed under direct vision. Throughout the                            procedure, the patient's blood pressure, pulse, and                            oxygen saturations were monitored continuously. The                            [id number] scope was introduced through                            the anus and advanced to the the cecum, identified                            by appendiceal orifice and ileocecal valve. Scope In: 1:03:05 PM Scope Out: 1:16:02 PM Scope Withdrawal Time: 0 hours 5 minutes 14 seconds  Total Procedure Duration: 0 hours 12 minutes 57 seconds  Findings:      The perianal and digital rectal examinations were normal.      Two sessile polyps were found in the sigmoid colon. The polyps were 4 to       5 mm in size. These polyps were removed with a cold snare. Resection and       retrieval were complete. Estimated blood loss was minimal. Anastomosis       with small bowel readily identified. It appeared normal. No evidence of       neoplasm found.  The exam was otherwise without abnormality on direct and  retroflexion       views. Impression:               - Two 4 to 5 mm polyps in the sigmoid colon,                            removed with a cold snare. Resected and retrieved.                            Post right hemicolectomy                           - The examination was otherwise normal on direct                            and retroflexion views. Moderate Sedation:      Moderate (conscious) sedation was administered by the endoscopy nurse       and supervised by the endoscopist. The following parameters were       monitored: oxygen saturation, heart rate, blood pressure, respiratory       rate, EKG, adequacy of pulmonary ventilation, and response to care.       Total physician intraservice time was 18 minutes. Recommendation:           - Patient has a contact number available for                            emergencies. The signs and symptoms of potential                            delayed complications were discussed with the                            patient. Return to normal activities tomorrow.                            Written discharge instructions were provided to the                            patient.                           - Resume previous diet.                           - Continue present medications.                           - Repeat colonoscopy date to be determined after                            pending pathology results are reviewed for                            surveillance based on pathology results.                           -  Return to GI office in 1 year. Per patient                            request, she wanted to put off screening EGD for                            esophageal varices today. Consequently, that                            examination was not performed. Procedure Code(s):        --- Professional ---                           [phone], Colonoscopy, flexible; with removal of                            tumor(s), polyp(s), or other lesion(s) by  snare                            technique                           G0500, Moderate sedation services provided by the                            same physician or other qualified health care                            professional performing a gastrointestinal                            endoscopic service that sedation supports,                            requiring the presence of an independent trained                            observer to assist in the monitoring of the                            patient's level of consciousness and physiological                            status; initial 15 minutes of intra-service time;                            patient age 40 years or older (additional time may                            be reported with [phone], as appropriate) Diagnosis Code(s):        --- Professional ---                           [phone], Personal history of other malignant  neoplasm of large intestine                           K63.5, Polyp of colon CPT copyright 2019 [employer] Association. All rights reserved. The codes documented in this report are preliminary and upon coder review may  be revised to meet current compliance requirements. Cristopher Estimable. Jeryn Cerney, MD Norvel Richards, MD 01/18/2019 1:24:06 PM This report has been signed electronically. Number of Addenda: 0

## 2019-01-18 NOTE — Interval H&P Note (Signed)
History and Physical Interval Note:  01/18/2019 12:53 PM  [name]  has presented today for surgery, with the diagnosis of H/O CRC. cirrhosis.  The various methods of treatment have been discussed with the patient and family. After consideration of risks, benefits and other options for treatment, the patient has consented to  Procedure(s) with comments: COLONOSCOPY (N/A) - 1:00pm ESOPHAGOGASTRODUODENOSCOPY (EGD) (N/A) ESOPHAGEAL BANDING (N/A) as a surgical intervention.  The patient's history has been reviewed, patient examined, no change in status, stable for surgery.  I have reviewed the patient's chart and labs.  Questions were answered to the patient's satisfaction.     Shavana Calder  No interval change.  First surveillance colonoscopy post segmental resection for colon cancer last year.  EGD scheduled to screen for varices however, patient wishes to hold off on that procedure from for now.  Therefore, surveillance colonoscopy only today per plan.  The risks, benefits, limitations, alternatives and imponderables have been reviewed with the patient. Questions have been answered. All parties are agreeable.

## 2019-01-18 NOTE — Discharge Instructions (Signed)
Colonoscopy Discharge Instructions  Read the instructions outlined below and refer to this sheet in the next few weeks. These discharge instructions provide you with general information on caring for yourself after you leave the hospital. Your doctor may also give you specific instructions. While your treatment has been planned according to the most current medical practices available, unavoidable complications occasionally occur. If you have any problems or questions after discharge, call Dr. Gala Romney at [phone]. ACTIVITY  You may resume your regular activity, but move at a slower pace for the next 24 hours.   Take frequent rest periods for the next 24 hours.   Walking will help get rid of the air and reduce the bloated feeling in your belly (abdomen).   No driving for 24 hours (because of the medicine (anesthesia) used during the test).    Do not sign any important legal documents or operate any machinery for 24 hours (because of the anesthesia used during the test).  NUTRITION  Drink plenty of fluids.   You may resume your normal diet as instructed by your doctor.   Begin with a light meal and progress to your normal diet. Heavy or fried foods are harder to digest and may make you feel sick to your stomach (nauseated).   Avoid alcoholic beverages for 24 hours or as instructed.  MEDICATIONS  You may resume your normal medications unless your doctor tells you otherwise.  WHAT YOU CAN EXPECT TODAY  Some feelings of bloating in the abdomen.   Passage of more gas than usual.   Spotting of blood in your stool or on the toilet paper.  IF YOU HAD POLYPS REMOVED DURING THE COLONOSCOPY:  No aspirin products for 7 days or as instructed.   No alcohol for 7 days or as instructed.   Eat a soft diet for the next 24 hours.  FINDING OUT THE RESULTS OF YOUR TEST Not all test results are available during your visit. If your test results are not back during the visit, make an appointment  with your caregiver to find out the results. Do not assume everything is normal if you have not heard from your caregiver or the medical facility. It is important for you to follow up on all of your test results.  SEEK IMMEDIATE MEDICAL ATTENTION IF:  You have more than a spotting of blood in your stool.   Your belly is swollen (abdominal distention).   You are nauseated or vomiting.   You have a temperature over 101.   You have abdominal pain or discomfort that is severe or gets worse throughout the day.   2 small polyps removed today.  No sign of colon cancer.  Further recommendations to follow once polyp report back for review  I called Elvin at [phone] and left a message    Colon Polyps  Polyps are tissue growths inside the body. Polyps can grow in many places, including the large intestine (colon). A polyp may be a round bump or a mushroom-shaped growth. You could have one polyp or several. Most colon polyps are noncancerous (benign). However, some colon polyps can become cancerous over time. Finding and removing the polyps early can help prevent this. What are the causes? The exact cause of colon polyps is not known. What increases the risk? You are more likely to develop this condition if you:  Have a family history of colon cancer or colon polyps.  Are older than 34 or older than 45 if you are African American.  Have inflammatory bowel disease, such as ulcerative colitis or Crohn's disease.  Have certain hereditary conditions, such as: ? Familial adenomatous polyposis. ? Lynch syndrome. ? Turcot syndrome. ? Peutz-Jeghers syndrome.  Are overweight.  Smoke cigarettes.  Do not get enough exercise.  Drink too much alcohol.  Eat a diet that is high in fat and red meat and low in fiber.  Had childhood cancer that was treated with abdominal radiation. What are the signs or symptoms? Most polyps do not cause symptoms. If you have symptoms, they may  include:  Blood coming from your rectum when having a bowel movement.  Blood in your stool. The stool may look dark red or black.  Abdominal pain.  A change in bowel habits, such as constipation or diarrhea. How is this diagnosed? This condition is diagnosed with a colonoscopy. This is a procedure in which a lighted, flexible scope is inserted into the anus and then passed into the colon to examine the area. Polyps are sometimes found when a colonoscopy is done as part of routine cancer screening tests. How is this treated? Treatment for this condition involves removing any polyps that are found. Most polyps can be removed during a colonoscopy. Those polyps will then be tested for cancer. Additional treatment may be needed depending on the results of testing. Follow these instructions at home: Lifestyle  Maintain a healthy weight, or lose weight if recommended by your health care provider.  Exercise every day or as told by your health care provider.  Do not use any products that contain nicotine or tobacco, such as cigarettes and e-cigarettes. If you need help quitting, ask your health care provider.  If you drink alcohol, limit how much you have: ? 0-1 drink a day for women. ? 0-2 drinks a day for men.  Be aware of how much alcohol is in your drink. In the [place] one drink equals one 12 oz bottle of beer (355 mL), one 5 oz glass of wine (148 mL), or one 1 oz shot of hard liquor (44 mL). Eating and drinking   Eat foods that are high in fiber, such as fruits, vegetables, and whole grains.  Eat foods that are high in calcium and vitamin D, such as milk, cheese, yogurt, eggs, liver, fish, and broccoli.  Limit foods that are high in fat, such as fried foods and desserts.  Limit the amount of red meat and processed meat you eat, such as hot dogs, sausage, bacon, and lunch meats. General instructions  Keep all follow-up visits as told by your health care provider. This is  important. ? This includes having regularly scheduled colonoscopies. ? Talk to your health care provider about when you need a colonoscopy. Contact a health care provider if:  You have new or worsening bleeding during a bowel movement.  You have new or increased blood in your stool.  You have a change in bowel habits.  You lose weight for no known reason. Summary  Polyps are tissue growths inside the body. Polyps can grow in many places, including the colon.  Most colon polyps are noncancerous (benign), but some can become cancerous over time.  This condition is diagnosed with a colonoscopy.  Treatment for this condition involves removing any polyps that are found. Most polyps can be removed during a colonoscopy. This information is not intended to replace advice given to you by your health care provider. Make sure you discuss any questions you have with your health care provider. Document Released: 01/15/2004  Document Revised: 08/05/2017 Document Reviewed: 08/05/2017 Elsevier Patient Education  [place].

## 2019-01-19 ENCOUNTER — Telehealth: Payer: Self-pay | Admitting: Internal Medicine

## 2019-01-19 LAB — SURGICAL PATHOLOGY

## 2019-01-19 NOTE — Telephone Encounter (Signed)
Spoke with pt. She had her TCS yesterday and felt a little lightheaded when she was cleaning up today. Pt states when she drunk water she started to feel better. Pt advised to continue to drink fluids, take it easy for the rest of the day. If pt continues to feel lightheaded, starts to vomit uncontrollable or symptoms worsen, she it to proceed to the ED.

## 2019-01-19 NOTE — Telephone Encounter (Signed)
Pt had colonoscopy yesterday and said today she was light headed and didn't know if the procedure would have anything to do with that. Please call [phone]

## 2019-01-25 ENCOUNTER — Inpatient Hospital Stay (HOSPITAL_BASED_OUTPATIENT_CLINIC_OR_DEPARTMENT_OTHER): Payer: Medicare Other | Admitting: Hematology

## 2019-01-25 ENCOUNTER — Other Ambulatory Visit: Payer: Self-pay

## 2019-01-25 ENCOUNTER — Encounter (HOSPITAL_COMMUNITY): Payer: Self-pay | Admitting: Internal Medicine

## 2019-01-25 ENCOUNTER — Encounter: Payer: Self-pay | Admitting: Internal Medicine

## 2019-01-25 DIAGNOSIS — Z79899 Other long term (current) drug therapy: Secondary | ICD-10-CM | POA: Diagnosis not present

## 2019-01-25 DIAGNOSIS — C183 Malignant neoplasm of hepatic flexure: Secondary | ICD-10-CM | POA: Diagnosis not present

## 2019-01-25 DIAGNOSIS — Z8249 Family history of ischemic heart disease and other diseases of the circulatory system: Secondary | ICD-10-CM | POA: Diagnosis not present

## 2019-01-25 DIAGNOSIS — R197 Diarrhea, unspecified: Secondary | ICD-10-CM | POA: Diagnosis not present

## 2019-01-25 DIAGNOSIS — K7460 Unspecified cirrhosis of liver: Secondary | ICD-10-CM | POA: Diagnosis not present

## 2019-01-25 NOTE — Assessment & Plan Note (Signed)
1.  Stage II (T2N0) hepatic flexure colon adenocarcinoma: -Status post right colon segmental resection on 06/15/2017, 3.2 cm tumor, 0/12 lymph nodes positive.  MMR normal/MSI low -CEA a month after surgery was elevated at 6.3.  CT of the chest did not show any metastatic disease.  -PET/CT scan on 08/16/2017 did not show any evidence of recurrence or metastatic disease.  Her CEA elevation is likely from her liver disease.  She had a history of hepatitis C and was treated for it. -CT scan of the abdomen and pelvis on 02/23/2018 shows postoperative changes of partial right colectomy without definite evidence of local or metastatic disease.  There is mild narrowing in the region of hepatic flexure, presumably at surgical anastomosis. - CEA on 06/01/2018 has increased to 8.9.  Prior CEA 3 months ago was 7.7. - PET CT scan on 06/22/2018 shows no findings of recurrent/metastatic colon cancer.  Asymmetric palatine tonsillar activity, left greater than right. - CT of abdomen and pelvis on January 11, 2019 was negative for any recurrent or metastatic disease. -Patient had a colonoscopy on January 18, 2019, 2 polyps were found but were negative for malignancy. - CEA slightly elevated at 8.0.  We will continue to monitor this closely. -She will return to clinic in 3 months.  Plan to repeat CT of abdomen pelvis in 6 months.  2.  Iron deficiency state: -Last ferritin was 50.  Hemoglobin today is 13.5.  She does not require any iron supplements.  3.  Hepatitis C -Patient had a blood transfusion before 1974 was told she contracted hepatitis C.  She was treated 5 years ago for hepatitis C.

## 2019-01-25 NOTE — Progress Notes (Signed)
[address] [place], [place] [address]   CLINIC:  Medical Oncology/Hematology  PCP:  Sharilyn Sites, [place] [id number] [phone]   REASON FOR VISIT:  Follow-up for Colon Cancer   CURRENT THERAPY: Clinical surveillance  BRIEF ONCOLOGIC HISTORY:  Oncology History  Primary cancer of hepatic flexure of colon ([place])  06/15/2017 Initial Diagnosis   Primary cancer of hepatic flexure of colon ([facility] [place])   06/15/2017 Cancer Staging   Staging form: Colon and Rectum - Neuroendocine Tumors, AJCC 8th Edition - Clinical stage from 06/15/2017: Stage IIA (cT2, cN0, cM0) - Signed by Derek Jack, MD on 08/18/2017      CANCER STAGING: Cancer Staging Primary cancer of hepatic flexure of colon ([place]) Staging form: Colon and Rectum - Neuroendocine Tumors, AJCC 8th Edition - Clinical stage from 06/15/2017: Stage IIA (cT2, cN0, cM0) - Signed by Derek Jack, MD on 08/18/2017    INTERVAL HISTORY:  Ariana Long 63 y.o. female presents today for follow-up.  She reports overall doing well.  She denies any significant fatigue.  She continues with reports of loose stools that has been present since her hemicolectomy. Denies diarrhea. Denies any BRBPR. Appetite is stable.  No weight loss. No abdominal pain. She is here to discuss recent scans and lab results. She had her one year colonoscopy, which was negative for disease.    REVIEW OF SYSTEMS:  Review of Systems  All other systems reviewed and are negative.    PAST MEDICAL/SURGICAL HISTORY:  Past Medical History:  Diagnosis Date  . Cirrhosis of liver ([facility]) 01/26/2011   FIBROSIS on liver biopsy in 2007, U/S  03/31/2013= mild diffuse hepatic steatosis and /or hepatocellular disease without focal hepatic parenchymal abnormality. per Dr. Patsy Baltimore (2012) pt is immune to hep A and Hep B. per 12/06/13 note from Liver clinic-pt had MRI which showed cirrhosis 2015.  . Diverticulitis of colon   .  Elevated AFP 01/26/2011  . Gall stones   . Hematuria 12/12/2014  . Hemorrhoid 12/01/2012  . Hemorrhoids   . Hepatitis C 01/26/2011   2005 non responder, genotype 1, Gr 1-2, Stage 3 . Treated with Harvoni at the Liver clinic, responder  . Hidradenitis   . History of hiatal hernia   . History of kidney stones   . Hx: UTI (urinary tract infection)   . Kidney stones 10/2011  . Osteoporosis   . Primary cancer of hepatic flexure of colon ([place]) 06/15/2017  . Serrated adenoma of colon 11/2011  . Splenomegaly 01/26/2011  . Thrombocytopenia ([facility]) 01/26/2011  . Tubular adenoma   . Vulval lesion 12/14/2013   Has kissing lesions thin and puffy and paler i color about 5-6- mm in diameter.Dr Glo Herring in to co examine will try temovate and recheck in 3 months   Past Surgical History:  Procedure Laterality Date  . AXILLARY SURGERY Bilateral 1979   "glands removed"  . BREAST BIOPSY Right 2011  . BREAST EXCISIONAL BIOPSY Bilateral 25 years ago   Benign, fatty tumors  . CESAREAN SECTION     x 2  . COLECTOMY  06/15/2017   LAPAROSCOPIC ASSISTED RIGHT COLECTOMY, ERAS PATHWAY/notes 06/15/2017  . COLONOSCOPY  07/31/2005   [facility]-Normal rectum/Normal colonoscopy/Repeat screening colonoscopy ten years  . COLONOSCOPY  11/16/2011   RMR: Friable anorectum-likely source of hematochezia/ LARGE 1.2x2.5CM SERRATED ADENOMA resected from ascending colon 7CM DISTAL TO ICV, hyperplastic polyp removed   . COLONOSCOPY N/A 07/18/2012   Dr. Gala Romney- normal appearing rectal mucosa. no  residual polpy tissue seen at [place] location. remainder of colonic mucosa appeared entirely normal. bx = tubular adenoma  . COLONOSCOPY N/A 08/29/2015   Dr. Gala Romney: single 12 mm polyp in ascending colon, 4 mm polyp ascending. Sessile serrated adenoma. 3 year surveillance  . COLONOSCOPY N/A 05/19/2017   4X2 cm sessile lesion at hepatic flexdure, unable to be completely removed  . ESOPHAGEAL BANDING N/A 08/29/2015   Procedure: ESOPHAGEAL BANDING;  Surgeon:  Daneil Dolin, MD;  [facility];  Service: Endoscopy;  Laterality: N/A;  . ESOPHAGOGASTRODUODENOSCOPY N/A 08/29/2015   Dr. Gala Romney: non-critical Schatzki's ring, somewhat prominent small submucosal vessels without obvious varices. hyperplastic gastric polyp. 2 year surveillance  . HEMORRHOID BANDING    . LAPAROSCOPIC RIGHT COLECTOMY N/A 06/15/2017   Procedure: LAPAROSCOPIC ASSISTED RIGHT COLECTOMY, ERAS PATHWAY;  Surgeon: Fanny Skates, MD;  Location: [facility];  Service: General;  Laterality: N/A;  . POLYPECTOMY  05/19/2017   Procedure: POLYPECTOMY;  Surgeon: Daneil Dolin, MD;  Location: [facility];  Service: Endoscopy;;  . SKIN LESION EXCISION       SOCIAL HISTORY:  Social History   Socioeconomic History  . Marital status: Married    Spouse name: Not on file  . Number of children: 2  . Years of education: Not on file  . Highest education level: Not on file  Occupational History  . Occupation: Occupational psychologist: [employer]  . Financial resource strain: Not hard at all  . Food insecurity    Worry: Never true    Inability: Never true  . Transportation needs    Medical: No    Non-medical: No  Tobacco Use  . Smoking status: Never Smoker  . Smokeless tobacco: Never Used  Substance and Sexual Activity  . Alcohol use: No  . Drug use: No  . Sexual activity: Yes    Birth control/protection: Post-menopausal  Lifestyle  . Physical activity    Days per week: 3 days    Minutes per session: 30 min  . Stress: Not at all  Relationships  . Social connections    Talks on phone: More than three times a week    Gets together: More than three times a week    Attends religious service: More than 4 times per year    Active member of club or organization: Yes    Attends meetings of clubs or organizations: More than 4 times per year    Relationship status: Married  . Intimate partner violence    Fear of current or ex partner: No    Emotionally  abused: No    Physically abused: No    Forced sexual activity: No  Other Topics Concern  . Not on file  Social History Narrative  . Not on file    FAMILY HISTORY:  Family History  Problem Relation Age of Onset  . Heart attack Mother   . Stroke Mother   . Aneurysm Father   . Colon cancer Neg Hx     CURRENT MEDICATIONS:  Outpatient Encounter Medications as of 01/25/2019  Medication Sig Note  . Ascorbic Acid (VITAMIN C) 1000 MG tablet Take 1,000 mg by mouth daily.   Marland Kitchen CALCIUM-MAGNESIUM PO Take 1 tablet by mouth every evening.    . Cholecalciferol (VITAMIN D3) 50 MCG (2000 UT) TABS Take 6,000 Units by mouth daily.   . milk thistle 175 MG tablet Take 1,752 mg by mouth daily.   . Multiple Vitamin (  MULTIVITAMIN WITH MINERALS) TABS tablet Take 1 tablet by mouth once a week. In the morning.   . Multiple Vitamins-Minerals (HAIR/SKIN/NAILS/BIOTIN PO) Take 1 tablet by mouth daily.   Marland Kitchen OLIVE LEAF EXTRACT PO Take 1 tablet by mouth daily.   . Omega-3 Fatty Acids (FISH OIL) 1000 MG CAPS Take 1,000 mg by mouth daily.   Marland Kitchen PAU D ARCO PO Take 1 tablet by mouth daily.   . Probiotic CAPS Take 1 capsule by mouth daily.    Marland Kitchen VITAMIN A PO Take 1 tablet by mouth daily.    . vitamin B-12 (CYANOCOBALAMIN) 500 MCG tablet Take 500 mcg by mouth daily.   Marland Kitchen zinc gluconate 50 MG tablet Take 50 mg by mouth daily.   . clobetasol ointment (TEMOVATE) 0.05 % Use 2-3 x weekly (Patient not taking: Reported on 01/25/2019)   . fluticasone (CUTIVATE) 0.05 % cream Apply 1 application topically 2 (two) times daily as needed (lichen planus).   . fluticasone (FLONASE) 50 MCG/ACT nasal spray Place 1 spray into both nostrils daily as needed for allergies or rhinitis.   . hydrocortisone (ANUSOL-HC) 25 MG suppository Place 1 suppository (25 mg total) rectally every 12 (twelve) hours. (Patient not taking: Reported on 01/25/2019) 11/29/2018: On hand  . naproxen sodium (ALEVE) 220 MG tablet Take 220 mg by mouth 2 (two) times daily as  needed (pain.).   . [DISCONTINUED] polyethylene glycol-electrolytes (TRILYTE) 420 g solution Take 4,000 mLs by mouth as directed. (Patient not taking: Reported on 01/12/2019) 11/29/2018: Before colonoscopy   No facility-administered encounter medications on file as of 01/25/2019.     ALLERGIES:  No Known Allergies   PHYSICAL EXAM:  ECOG Performance status: 1  Vitals:   01/25/19 1207  BP: 126/72  Pulse: 61  Resp: 18  Temp: (!) 97.5 F (36.4 C)  SpO2: 99%   Filed Weights   01/25/19 1207  Weight: 157 lb 6.4 oz (71.4 kg)    Physical Exam Constitutional:      Appearance: Normal appearance. She is obese.  HENT:     Head: Normocephalic.     Right Ear: External ear normal.     Left Ear: External ear normal.     Nose: Nose normal.     Mouth/Throat:     Pharynx: Oropharynx is clear.  Eyes:     Conjunctiva/sclera: Conjunctivae normal.  Neck:     Musculoskeletal: Normal range of motion.  Cardiovascular:     Rate and Rhythm: Normal rate and regular rhythm.     Pulses: Normal pulses.     Heart sounds: Normal heart sounds.  Pulmonary:     Effort: Pulmonary effort is normal.     Breath sounds: Normal breath sounds.  Abdominal:     General: Bowel sounds are normal.  Musculoskeletal: Normal range of motion.  Skin:    General: Skin is warm and dry.  Neurological:     General: No focal deficit present.     Mental Status: She is alert and oriented to person, place, and time.  Psychiatric:        Mood and Affect: Mood normal.        Behavior: Behavior normal.        Thought Content: Thought content normal.        Judgment: Judgment normal.      LABORATORY DATA:  I have reviewed the labs as listed.  CBC    Component Value Date/Time   WBC 4.1 01/11/2019 0941   RBC 4.59 01/11/2019 0941  HGB 13.9 01/11/2019 0941   HCT 42.8 01/11/2019 0941   HCT 42 02/25/2012 1033   PLT 156 01/11/2019 0941   MCV 93.2 01/11/2019 0941   MCH 30.3 01/11/2019 0941   MCHC 32.5 01/11/2019  0941   RDW 13.2 01/11/2019 0941   LYMPHSABS 1.7 01/11/2019 0941   MONOABS 0.4 01/11/2019 0941   EOSABS 0.1 01/11/2019 0941   BASOSABS 0.1 01/11/2019 0941   CMP Latest Ref Rng & Units 01/11/2019 01/11/2019 10/04/2018  Glucose 70 - 99 mg/dL - 100(H) 98  BUN 8 - 23 mg/dL - 16 14  Creatinine 0.44 - 1.00 mg/dL 0.80 0.76 0.70  Sodium 135 - 145 mmol/L - 140 141  Potassium 3.5 - 5.1 mmol/L - 4.0 4.4  Chloride 98 - 111 mmol/L - 105 106  CO2 22 - 32 mmol/L - 26 24  Calcium 8.9 - 10.3 mg/dL - 9.6 9.4  Total Protein 6.5 - 8.1 g/dL - 8.2(H) 7.9  Total Bilirubin 0.3 - 1.2 mg/dL - 0.6 0.5  Alkaline Phos 38 - 126 U/L - 78 74  AST 15 - 41 U/L - 26 23  ALT 0 - 44 U/L - 25 23       DIAGNOSTIC IMAGING:  I have independently reviewed the scans and discussed with the patient.   I have reviewed Francene Finders, NP's note and agree with the documentation.  I personally performed a face-to-face visit, made revisions and my assessment and plan is as follows.    ASSESSMENT & PLAN:   No problem-specific Assessment & Plan notes found for this encounter.      Orders placed this encounter:  No orders of the defined types were placed in this encounter.     Derek Jack, MD [place] [phone]

## 2019-02-06 ENCOUNTER — Ambulatory Visit: Payer: Medicare Other | Admitting: Gastroenterology

## 2019-03-08 DIAGNOSIS — L608 Other nail disorders: Secondary | ICD-10-CM | POA: Diagnosis not present

## 2019-03-08 DIAGNOSIS — L438 Other lichen planus: Secondary | ICD-10-CM | POA: Diagnosis not present

## 2019-03-08 DIAGNOSIS — L603 Nail dystrophy: Secondary | ICD-10-CM | POA: Diagnosis not present

## 2019-03-22 DIAGNOSIS — B351 Tinea unguium: Secondary | ICD-10-CM | POA: Diagnosis not present

## 2019-03-22 DIAGNOSIS — M542 Cervicalgia: Secondary | ICD-10-CM | POA: Diagnosis not present

## 2019-03-22 DIAGNOSIS — M1991 Primary osteoarthritis, unspecified site: Secondary | ICD-10-CM | POA: Diagnosis not present

## 2019-03-22 DIAGNOSIS — Z6830 Body mass index (BMI) 30.0-30.9, adult: Secondary | ICD-10-CM | POA: Diagnosis not present

## 2019-03-27 DIAGNOSIS — L438 Other lichen planus: Secondary | ICD-10-CM | POA: Diagnosis not present

## 2019-03-27 DIAGNOSIS — L818 Other specified disorders of pigmentation: Secondary | ICD-10-CM | POA: Diagnosis not present

## 2019-04-04 ENCOUNTER — Other Ambulatory Visit (HOSPITAL_COMMUNITY): Payer: Self-pay | Admitting: Family Medicine

## 2019-04-04 DIAGNOSIS — Z1231 Encounter for screening mammogram for malignant neoplasm of breast: Secondary | ICD-10-CM

## 2019-04-06 ENCOUNTER — Encounter: Payer: Self-pay | Admitting: Student

## 2019-04-06 ENCOUNTER — Telehealth (INDEPENDENT_AMBULATORY_CARE_PROVIDER_SITE_OTHER): Payer: Medicare Other | Admitting: Student

## 2019-04-06 VITALS — Ht 65.5 in | Wt 154.0 lb

## 2019-04-06 DIAGNOSIS — G479 Sleep disorder, unspecified: Secondary | ICD-10-CM | POA: Diagnosis not present

## 2019-04-06 DIAGNOSIS — R002 Palpitations: Secondary | ICD-10-CM

## 2019-04-06 NOTE — Progress Notes (Signed)
Virtual Visit via Telephone Note   This visit type was conducted due to national recommendations for restrictions regarding the COVID-19 Pandemic (e.g. social distancing) in an effort to limit this patient's exposure and mitigate transmission in our community.  Due to her co-morbid illnesses, this patient is at least at moderate risk for complications without adequate follow up.  This format is felt to be most appropriate for this patient at this time.  The patient did not have access to video technology/had technical difficulties with video requiring transitioning to audio format only (telephone).  All issues noted in this document were discussed and addressed.  No physical exam could be performed with this format.  Please refer to the patient's chart for her  consent to telehealth for [facility].   Date:  04/06/2019   ID:  [name], DOB 05-18-1955, MRN [id number]  Patient Location: Home Provider Location: Office  PCP:  Sharilyn Sites, MD  Cardiologist:  Jenkins Rouge, MD  Electrophysiologist:  None   Evaluation Performed:  Follow-Up Visit  Chief Complaint: 53-month visit  History of Present Illness:    Ariana Long is a 63 y.o. female with past medical history of palpitations (PAC's by prior monitoring), liver cirrhosis, Hepatitis C (treated) and colon cancer (s/p segmental resection in 06/2017) who presents for a 53-month follow-up telehealth visit.   She most recently had a telehealth visit with myself in 11/2018 and reported having worsening palpitations for 2-3 months. Self reduced her caffeine intake and symptoms had improved but she was still experiencing palpitations which would last a few seconds then resolve. It was recommended she have a 2-week monitor with consideration of BB therapy once results were available. Her monitor showed NSR with PVC's but no episodes of NSVT or PAF. It was recommended she start Toprol-XL 12.5mg  daily and titrate to 25mg  daily if symptoms did  not improve.   In talking with the patient today, she reports overall doing well since her last visit.  She has significantly reduced her caffeine intake and consumes less than 1 cup of decaffeinated coffee regularly. She reports having chocolate covered fruit several weeks ago and had palpitations with this but recognized the culprit and has since monitored her intake of chocolate. She denies any recent dyspnea on exertion, chest pain, orthopnea, PND or lower extremity edema.  She has not been going to the [facility] due to [place] but does have a treadmill and recumbent bicycle at home. She plans to utilize these more regularly in the future.  The patient does not have symptoms concerning for COVID-19 infection (fever, chills, cough, or new shortness of breath).    Past Medical History:  Diagnosis Date  . Cirrhosis of liver ([place]) 01/26/2011   FIBROSIS on liver biopsy in 2007, U/S  03/31/2013= mild diffuse hepatic steatosis and /or hepatocellular disease without focal hepatic parenchymal abnormality. per Dr. Patsy [place] (2012) pt is immune to hep A and Hep B. per 12/06/13 note from Liver clinic-pt had MRI which showed cirrhosis 2015.  . Diverticulitis of colon   . Elevated AFP 01/26/2011  . Gall stones   . Hematuria 12/12/2014  . Hemorrhoid 12/01/2012  . Hemorrhoids   . Hepatitis C 01/26/2011   2005 non responder, genotype 1, Gr 1-2, Stage 3 . Treated with Harvoni at the Liver clinic, responder  . Hidradenitis   . History of hiatal hernia   . History of kidney stones   . Hx: UTI (urinary tract infection)   . Kidney stones 10/2011  .  Osteoporosis   . Palpitations   . Primary cancer of hepatic flexure of colon ([place]) 06/15/2017  . Serrated adenoma of colon 11/2011  . Splenomegaly 01/26/2011  . Thrombocytopenia ([place]) 01/26/2011  . Tubular adenoma   . Vulval lesion 12/14/2013   Has kissing lesions thin and puffy and paler i color about 5-6- mm in diameter.Dr Glo Herring in to co examine will try temovate and recheck  in 3 months   Past Surgical History:  Procedure Laterality Date  . AXILLARY SURGERY Bilateral 1979   "glands removed"  . BREAST BIOPSY Right 2011  . BREAST EXCISIONAL BIOPSY Bilateral 25 years ago   Benign, fatty tumors  . CESAREAN SECTION     x 2  . COLECTOMY  06/15/2017   LAPAROSCOPIC ASSISTED RIGHT COLECTOMY, ERAS PATHWAY/notes 06/15/2017  . COLONOSCOPY  07/31/2005   Rourk-Normal rectum/Normal colonoscopy/Repeat screening colonoscopy ten years  . COLONOSCOPY  11/16/2011   RMR: Friable anorectum-likely source of hematochezia/ LARGE 1.2x2.5CM SERRATED ADENOMA resected from ascending colon 7CM DISTAL TO ICV, hyperplastic polyp removed   . COLONOSCOPY N/A 07/18/2012   Dr. Gala Romney- normal appearing rectal mucosa. no residual polpy tissue seen at tatoo location. remainder of colonic mucosa appeared entirely normal. bx = tubular adenoma  . COLONOSCOPY N/A 08/29/2015   Dr. Gala Romney: single 12 mm polyp in ascending colon, 4 mm polyp ascending. Sessile serrated adenoma. 3 year surveillance  . COLONOSCOPY N/A 05/19/2017   4X2 cm sessile lesion at hepatic flexdure, unable to be completely removed  . COLONOSCOPY N/A 01/18/2019   Procedure: COLONOSCOPY;  Surgeon: Daneil Dolin, MD;  [facility];  Service: Endoscopy;  Laterality: N/A;  1:00pm  . ESOPHAGEAL BANDING N/A 08/29/2015   Procedure: ESOPHAGEAL BANDING;  Surgeon: Daneil Dolin, MD;  [facility];  Service: Endoscopy;  Laterality: N/A;  . ESOPHAGOGASTRODUODENOSCOPY N/A 08/29/2015   Dr. Gala Romney: non-critical Schatzki's ring, somewhat prominent small submucosal vessels without obvious varices. hyperplastic gastric polyp. 2 year surveillance  . HEMORRHOID BANDING    . LAPAROSCOPIC RIGHT COLECTOMY N/A 06/15/2017   Procedure: LAPAROSCOPIC ASSISTED RIGHT COLECTOMY, ERAS PATHWAY;  Surgeon: Fanny Skates, MD;  Location: [facility];  Service: General;  Laterality: N/A;  . POLYPECTOMY  05/19/2017   Procedure: POLYPECTOMY;  Surgeon: Daneil Dolin, MD;  [facility];  Service: Endoscopy;;  . POLYPECTOMY  01/18/2019   Procedure: POLYPECTOMY;  Surgeon: Daneil Dolin, MD;  [facility];  Service: Endoscopy;;  . SKIN LESION EXCISION       Current Meds  Medication Sig  . Ascorbic Acid (VITAMIN C) 1000 MG tablet Take 1,000 mg by mouth daily.  Marland Kitchen CALCIUM-MAGNESIUM PO Take 1 tablet by mouth every evening.   . Cholecalciferol (VITAMIN D3) 50 MCG (2000 UT) TABS Take 6,000 Units by mouth daily.  . fluticasone (CUTIVATE) 0.05 % cream Apply 1 application topically 2 (two) times daily as needed (lichen planus).  . fluticasone (FLONASE) 50 MCG/ACT nasal spray Place 1 spray into both nostrils daily as needed for allergies or rhinitis.  . hydrocortisone (ANUSOL-HC) 25 MG suppository Place 1 suppository (25 mg total) rectally every 12 (twelve) hours.  . milk thistle 175 MG tablet Take 1,752 mg by mouth daily.  . Multiple Vitamin (MULTIVITAMIN WITH MINERALS) TABS tablet Take 1 tablet by mouth once a week. In the morning.  . Multiple Vitamins-Minerals (HAIR/SKIN/NAILS/BIOTIN PO) Take 1 tablet by mouth daily.  . naproxen sodium (ALEVE) 220 MG tablet Take 220 mg by mouth 2 (two)  times daily as needed (pain.).  Marland Kitchen OLIVE LEAF EXTRACT PO Take 1 tablet by mouth daily.  . Omega-3 Fatty Acids (FISH OIL) 1000 MG CAPS Take 1,000 mg by mouth daily.  Marland Kitchen PAU D ARCO PO Take 1 tablet by mouth daily.  . Probiotic CAPS Take 1 capsule by mouth daily.   Marland Kitchen VITAMIN A PO Take 1 tablet by mouth daily.   . vitamin B-12 (CYANOCOBALAMIN) 500 MCG tablet Take 500 mcg by mouth daily.  Marland Kitchen zinc gluconate 50 MG tablet Take 50 mg by mouth daily.     Allergies:   Patient has no known allergies.   Social History   Tobacco Use  . Smoking status: Never Smoker  . Smokeless tobacco: Never Used  Substance Use Topics  . Alcohol use: No  . Drug use: No     Family Hx: The patient's family history includes Aneurysm in her father; Heart attack in her  mother; Stroke in her mother. There is no history of Colon cancer.  ROS:   Please see the history of present illness.     All other systems reviewed and are negative.   Prior CV studies:   The following studies were reviewed today:  Echocardiogram: 10/2012 Study Conclusions   - Left ventricle: The cavity size was normal. Wall thickness  was normal. Systolic function was normal. The estimated  ejection fraction was in the range of 55% to 60%. Wall  motion was normal; there were no regional wall motion  abnormalities. Left ventricular diastolic function  parameters were normal.   Event Monitor: 11/2018 NSR Symptomatic PVC;s No significant NSVT or PaF  Labs/Other Tests and Data Reviewed:    EKG:  No ECG reviewed.  Recent Labs: 01/11/2019: ALT 25; BUN 16; Creatinine, Ser 0.80; Hemoglobin 13.9; Platelets 156; Potassium 4.0; Sodium 140   Recent Lipid Panel No results found for: CHOL, TRIG, HDL, CHOLHDL, LDLCALC, LDLDIRECT  Wt Readings from Last 3 Encounters:  04/06/19 154 lb (69.9 kg)  01/25/19 157 lb 6.4 oz (71.4 kg)  01/18/19 156 lb (70.8 kg)     Objective:    Vital Signs:  Ht 5' 5.5" (1.664 m)   Wt 154 lb (69.9 kg)   BMI 25.24 kg/m    General: Pleasant female sounding in NAD Psych: Normal affect. Neuro: Alert and oriented X 3. Lungs:  Resp regular and unlabored while talking on the phone.    ASSESSMENT & PLAN:    1. Palpitations - She reports her palpitations have mostly resolved since reducing her caffeine intake. Does not consume alcohol. She does have Toprol-XL to take as needed for palpitations but has not had to utilize this regularly. Continued reduction of caffeine intake was recommended.  2. Difficulty Sleeping - She reports having difficulty sleeping ever since her surgery for colon cancer in 2019. We reviewed the importance of appropriate sleep hygiene and limiting screen time before bed.  Also reviewed that she could utilize OTC Melatonin  to see if this helps with her symptoms.    COVID-19 Education: The signs and symptoms of COVID-19 were discussed with the patient and how to seek care for testing (follow up with PCP or arrange E-visit).  The importance of social distancing was discussed today.  Time:   Today, I have spent 18 minutes with the patient with telehealth technology discussing the above problems.     Medication Adjustments/Labs and Tests Ordered: Current medicines are reviewed at length with the patient today.  Concerns regarding medicines are outlined above.  Tests Ordered: No orders of the defined types were placed in this encounter.   Medication Changes: No orders of the defined types were placed in this encounter.   Follow Up:  In Person in 1 year(s)  Signed, Erma Heritage, PA-C  04/06/2019 2:24 PM    [place]

## 2019-04-06 NOTE — Patient Instructions (Signed)
Medication Instructions:  Your physician recommends that you continue on your current medications as directed. Please refer to the Current Medication list given to you today.  *If you need a refill on your cardiac medications before your next appointment, please call your pharmacy*  Lab Work: NONE  If you have labs (blood work) drawn today and your tests are completely normal, you will receive your results only by: Marland Kitchen MyChart Message (if you have MyChart) OR . A paper copy in the mail If you have any lab test that is abnormal or we need to change your treatment, we will call you to review the results.  Testing/Procedures: NONE   Follow-Up: At [facility], you and your health needs are our priority.  As part of our continuing mission to provide you with exceptional heart care, we have created designated Provider Care Teams.  These Care Teams include your primary Cardiologist (physician) and Advanced Practice Providers (APPs -  Physician Assistants and Nurse Practitioners) who all work together to provide you with the care you need, when you need it.  Your next appointment:   1 year(s)  The format for your next appointment:   In Person  Provider:   Jenkins Rouge, MD  Other Instructions Thank you for choosing [facility]!

## 2019-04-10 DIAGNOSIS — M1991 Primary osteoarthritis, unspecified site: Secondary | ICD-10-CM | POA: Diagnosis not present

## 2019-04-10 DIAGNOSIS — E7849 Other hyperlipidemia: Secondary | ICD-10-CM | POA: Diagnosis not present

## 2019-04-10 DIAGNOSIS — E559 Vitamin D deficiency, unspecified: Secondary | ICD-10-CM | POA: Diagnosis not present

## 2019-04-10 DIAGNOSIS — Z681 Body mass index (BMI) 19 or less, adult: Secondary | ICD-10-CM | POA: Diagnosis not present

## 2019-04-10 DIAGNOSIS — Z0001 Encounter for general adult medical examination with abnormal findings: Secondary | ICD-10-CM | POA: Diagnosis not present

## 2019-04-10 DIAGNOSIS — D509 Iron deficiency anemia, unspecified: Secondary | ICD-10-CM | POA: Diagnosis not present

## 2019-04-10 DIAGNOSIS — K7460 Unspecified cirrhosis of liver: Secondary | ICD-10-CM | POA: Diagnosis not present

## 2019-04-10 DIAGNOSIS — E538 Deficiency of other specified B group vitamins: Secondary | ICD-10-CM | POA: Diagnosis not present

## 2019-04-10 DIAGNOSIS — R7309 Other abnormal glucose: Secondary | ICD-10-CM | POA: Diagnosis not present

## 2019-04-24 ENCOUNTER — Ambulatory Visit (HOSPITAL_COMMUNITY)
Admission: RE | Admit: 2019-04-24 | Discharge: 2019-04-24 | Disposition: A | Discharge status: Home / Self Care | Payer: Medicare Other | Source: Ambulatory Visit | Attending: Family Medicine | Admitting: Family Medicine

## 2019-04-24 ENCOUNTER — Other Ambulatory Visit: Payer: Self-pay

## 2019-04-24 DIAGNOSIS — Z1231 Encounter for screening mammogram for malignant neoplasm of breast: Secondary | ICD-10-CM | POA: Insufficient documentation

## 2019-05-24 ENCOUNTER — Other Ambulatory Visit (HOSPITAL_COMMUNITY): Payer: Medicare Other

## 2019-05-30 ENCOUNTER — Other Ambulatory Visit (HOSPITAL_COMMUNITY): Payer: Self-pay | Admitting: *Deleted

## 2019-05-30 DIAGNOSIS — C183 Malignant neoplasm of hepatic flexure: Secondary | ICD-10-CM

## 2019-05-30 DIAGNOSIS — D500 Iron deficiency anemia secondary to blood loss (chronic): Secondary | ICD-10-CM

## 2019-05-31 ENCOUNTER — Other Ambulatory Visit: Payer: Self-pay

## 2019-05-31 ENCOUNTER — Ambulatory Visit (HOSPITAL_COMMUNITY): Payer: Medicare Other | Admitting: Hematology

## 2019-05-31 ENCOUNTER — Inpatient Hospital Stay (HOSPITAL_COMMUNITY): Payer: Medicare Other | Attending: Hematology

## 2019-05-31 DIAGNOSIS — C183 Malignant neoplasm of hepatic flexure: Secondary | ICD-10-CM | POA: Diagnosis not present

## 2019-05-31 DIAGNOSIS — D500 Iron deficiency anemia secondary to blood loss (chronic): Secondary | ICD-10-CM

## 2019-05-31 LAB — CBC WITH DIFFERENTIAL/PLATELET
Abs Immature Granulocytes: 0.01 10*3/uL (ref 0.00–0.07)
Basophils Absolute: 0 10*3/uL (ref 0.0–0.1)
Basophils Relative: 1 %
Eosinophils Absolute: 0.1 10*3/uL (ref 0.0–0.5)
Eosinophils Relative: 2 %
HCT: 41.7 % (ref 36.0–46.0)
Hemoglobin: 13.8 g/dL (ref 12.0–15.0)
Immature Granulocytes: 0 %
Lymphocytes Relative: 39 %
Lymphs Abs: 1.6 10*3/uL (ref 0.7–4.0)
MCH: 31.2 pg (ref 26.0–34.0)
MCHC: 33.1 g/dL (ref 30.0–36.0)
MCV: 94.3 fL (ref 80.0–100.0)
Monocytes Absolute: 0.5 10*3/uL (ref 0.1–1.0)
Monocytes Relative: 11 %
Neutro Abs: 1.9 10*3/uL (ref 1.7–7.7)
Neutrophils Relative %: 47 %
Platelets: 142 10*3/uL — ABNORMAL LOW (ref 150–400)
RBC: 4.42 MIL/uL (ref 3.87–5.11)
RDW: 13.1 % (ref 11.5–15.5)
WBC: 4.2 10*3/uL (ref 4.0–10.5)
nRBC: 0 % (ref 0.0–0.2)

## 2019-05-31 LAB — COMPREHENSIVE METABOLIC PANEL
ALT: 22 U/L (ref 0–44)
AST: 23 U/L (ref 15–41)
Albumin: 4.3 g/dL (ref 3.5–5.0)
Alkaline Phosphatase: 67 U/L (ref 38–126)
Anion gap: 8 (ref 5–15)
BUN: 10 mg/dL (ref 8–23)
CO2: 28 mmol/L (ref 22–32)
Calcium: 9.6 mg/dL (ref 8.9–10.3)
Chloride: 104 mmol/L (ref 98–111)
Creatinine, Ser: 0.83 mg/dL (ref 0.44–1.00)
GFR calc Af Amer: >60 mL/min (ref >60)
GFR calc non Af Amer: >60 mL/min (ref >60)
Glucose, Bld: 94 mg/dL (ref 70–99)
Potassium: 4.1 mmol/L (ref 3.5–5.1)
Sodium: 140 mmol/L (ref 135–145)
Total Bilirubin: 0.7 mg/dL (ref 0.3–1.2)
Total Protein: 7.7 g/dL (ref 6.5–8.1)

## 2019-06-01 LAB — CEA: CEA: 8.8 ng/mL — ABNORMAL HIGH (ref 0.0–4.7)

## 2019-06-07 ENCOUNTER — Ambulatory Visit (HOSPITAL_COMMUNITY): Payer: Medicare Other | Admitting: Nurse Practitioner

## 2019-06-09 ENCOUNTER — Inpatient Hospital Stay (HOSPITAL_COMMUNITY): Payer: Medicare Other | Attending: Hematology | Admitting: Nurse Practitioner

## 2019-06-09 ENCOUNTER — Other Ambulatory Visit: Payer: Self-pay

## 2019-06-09 ENCOUNTER — Ambulatory Visit (HOSPITAL_COMMUNITY): Payer: Medicare Other | Admitting: Nurse Practitioner

## 2019-06-09 DIAGNOSIS — C183 Malignant neoplasm of hepatic flexure: Secondary | ICD-10-CM | POA: Diagnosis not present

## 2019-06-09 NOTE — Patient Instructions (Signed)
[facility] Discharge Instructions  Follow up in 3 months with CT scan and labs   Thank you for choosing  [facility] at [facility] to provide your oncology and hematology care.  To afford each patient quality time with our provider, please arrive at least 15 minutes before your scheduled appointment time.   If you have a lab appointment with the Cancer Center please come in thru the Main Entrance and check in at the main information desk.  You need to re-schedule your appointment should you arrive 10 or more minutes late.  We strive to give you quality time with our providers, and arriving late affects you and other patients whose appointments are after yours.  Also, if you no show three or more times for appointments you may be dismissed from the clinic at the providers discretion.     Again, thank you for choosing [facility].  Our hope is that these requests will decrease the amount of time that you wait before being seen by our physicians.       _____________________________________________________________  Should you have questions after your visit to [facility], please contact our office at [phone] between the hours of 8:00 a.m. and 4:30 p.m.  Voicemails left after 4:00 p.m. will not be returned until the following business day.  For prescription refill requests, have your pharmacy contact our office and allow 72 hours.    Due to Covid, you will need to wear a mask upon entering the hospital. If you do not have a mask, a mask will be given to you at the Main Entrance upon arrival. For doctor visits, patients may have 1 support person with them. For treatment visits, patients can not have anyone with them due to social distancing guidelines and our immunocompromised population.      

## 2019-06-09 NOTE — Progress Notes (Signed)
Ariana Long, [place] [address]   CLINIC:  Medical Oncology/Hematology  PCP:  Ariana Long, [place] [address] [phone]   REASON FOR VISIT: Follow-up for colon cancer  CURRENT THERAPY: Observation  BRIEF ONCOLOGIC HISTORY:  Oncology History  Primary cancer of hepatic flexure of colon ([place])  06/15/2017 Initial Diagnosis   Primary cancer of hepatic flexure of colon ([place])   06/15/2017 Cancer Staging   Staging form: Colon and Rectum - Neuroendocine Tumors, AJCC 8th Edition - Clinical stage from 06/15/2017: Stage IIA (cT2, cN0, cM0) - Signed by Ariana Jack, MD on 08/18/2017      CANCER STAGING: Cancer Staging Primary cancer of hepatic flexure of colon ([place]) Staging form: Colon and Rectum - Neuroendocine Tumors, AJCC 8th Edition - Clinical stage from 06/15/2017: Stage IIA (cT2, cN0, cM0) - Signed by Ariana Jack, MD on 08/18/2017    INTERVAL HISTORY:  Ariana Long 64 y.o. female returns for routine follow-up colon cancer.  Patient reports she has been doing well since her last visit.  She denies any bright red bleeding per rectum or melena. Denies any nausea, vomiting, or diarrhea. Denies any new pains. Had not noticed any recent bleeding such as epistaxis, hematuria or hematochezia. Denies recent chest pain on exertion, shortness of breath on minimal exertion, pre-syncopal episodes, or palpitations. Denies any numbness or tingling in hands or feet. Denies any recent fevers, infections, or recent hospitalizations. Patient reports appetite at 100% and energy level at 75%.  She is eating well maintain her weight at this time.     REVIEW OF SYSTEMS:  Review of Systems  All other systems reviewed and are negative.    PAST MEDICAL/SURGICAL HISTORY:  Past Medical History:  Diagnosis Date  . Cirrhosis of liver ([facility]) 01/26/2011   FIBROSIS on liver biopsy in 2007, U/S  03/31/2013= mild diffuse hepatic  steatosis and /or hepatocellular disease without focal hepatic parenchymal abnormality. per Dr. Patsy Long (2012) pt is immune to hep A and Hep B. per 12/06/13 note from Liver clinic-pt had MRI which showed cirrhosis 2015.  . Diverticulitis of colon   . Elevated AFP 01/26/2011  . Gall stones   . Hematuria 12/12/2014  . Hemorrhoid 12/01/2012  . Hemorrhoids   . Hepatitis C 01/26/2011   2005 non responder, genotype 1, Gr 1-2, Stage 3 . Treated with Harvoni at the Liver clinic, responder  . Hidradenitis   . History of hiatal hernia   . History of kidney stones   . Hx: UTI (urinary tract infection)   . Kidney stones 10/2011  . Osteoporosis   . Palpitations   . Primary cancer of hepatic flexure of colon ([facility]) 06/15/2017  . Serrated adenoma of colon 11/2011  . Splenomegaly 01/26/2011  . Thrombocytopenia ([facility]) 01/26/2011  . Tubular adenoma   . Vulval lesion 12/14/2013   Has kissing lesions thin and puffy and paler i color about 5-6- mm in diameter.Dr Ariana Long in to co examine will try temovate and recheck in 3 months   Past Surgical History:  Procedure Laterality Date  . AXILLARY SURGERY Bilateral 1979   "glands removed"  . BREAST BIOPSY Right 2011  . BREAST EXCISIONAL BIOPSY Bilateral 25 years ago   Benign, fatty tumors  . CESAREAN SECTION     x 2  . COLECTOMY  06/15/2017   LAPAROSCOPIC ASSISTED RIGHT COLECTOMY, ERAS PATHWAY/notes 06/15/2017  . COLONOSCOPY  07/31/2005   [facility]-Normal rectum/Normal colonoscopy/Repeat screening colonoscopy ten years  . COLONOSCOPY  11/16/2011   RMR: Friable anorectum-likely source of hematochezia/ LARGE 1.2x2.5CM SERRATED ADENOMA resected from ascending colon 7CM DISTAL TO ICV, hyperplastic polyp removed   . COLONOSCOPY N/A 07/18/2012   Dr. Gala Long- normal appearing rectal mucosa. no residual polpy tissue seen at tatoo location. remainder of colonic mucosa appeared entirely normal. bx = tubular adenoma  . COLONOSCOPY N/A 08/29/2015   Dr. Gala Long: single 12 mm polyp in  ascending colon, 4 mm polyp ascending. Sessile serrated adenoma. 3 year surveillance  . COLONOSCOPY N/A 05/19/2017   4X2 cm sessile lesion at hepatic flexdure, unable to be completely removed  . COLONOSCOPY N/A 01/18/2019   Procedure: COLONOSCOPY;  Surgeon: Ariana Dolin, MD;  [facility];  Service: Endoscopy;  Laterality: N/A;  1:00pm  . ESOPHAGEAL BANDING N/A 08/29/2015   Procedure: ESOPHAGEAL BANDING;  Surgeon: Ariana Dolin, MD;  [facility];  Service: Endoscopy;  Laterality: N/A;  . ESOPHAGOGASTRODUODENOSCOPY N/A 08/29/2015   Dr. Gala Long: non-critical Schatzki's ring, somewhat prominent small submucosal vessels without obvious varices. hyperplastic gastric polyp. 2 year surveillance  . HEMORRHOID BANDING    . LAPAROSCOPIC RIGHT COLECTOMY N/A 06/15/2017   Procedure: LAPAROSCOPIC ASSISTED RIGHT COLECTOMY, ERAS PATHWAY;  Surgeon: Ariana Skates, MD;  Location: [facility];  Service: General;  Laterality: N/A;  . POLYPECTOMY  05/19/2017   Procedure: POLYPECTOMY;  Surgeon: Ariana Dolin, MD;  [facility];  Service: Endoscopy;;  . POLYPECTOMY  01/18/2019   Procedure: POLYPECTOMY;  Surgeon: Ariana Dolin, MD;  [facility];  Service: Endoscopy;;  . SKIN LESION EXCISION       SOCIAL HISTORY:  Social History   Socioeconomic History  . Marital status: Married    Spouse name: Not on file  . Number of children: 2  . Years of education: Not on file  . Highest education level: Not on file  Occupational History  . Occupation: Occupational psychologist: [employer]  Tobacco Use  . Smoking status: Never Smoker  . Smokeless tobacco: Never Used  Substance and Sexual Activity  . Alcohol use: No  . Drug use: No  . Sexual activity: Yes    Birth control/protection: Post-menopausal  Other Topics Concern  . Not on file  Social History Narrative  . Not on file   Social Determinants of Health   Financial Resource Strain:   . Difficulty of Paying  Living Expenses: Not on file  Food Insecurity:   . Worried About Charity fundraiser in the Last Year: Not on file  . Ran Out of Food in the Last Year: Not on file  Transportation Needs:   . Lack of Transportation (Medical): Not on file  . Lack of Transportation (Non-Medical): Not on file  Physical Activity:   . Days of Exercise per Week: Not on file  . Minutes of Exercise per Session: Not on file  Stress:   . Feeling of Stress : Not on file  Social Connections:   . Frequency of Communication with Friends and Family: Not on file  . Frequency of Social Gatherings with Friends and Family: Not on file  . Attends Religious Services: Not on file  . Active Member of Clubs or Organizations: Not on file  . Attends Archivist Meetings: Not on file  . Marital Status: Not on file  Intimate Partner Violence:   . Fear of Current or Ex-Partner: Not on file  . Emotionally Abused: Not on file  . Physically Abused:  Not on file  . Sexually Abused: Not on file    FAMILY HISTORY:  Family History  Problem Relation Age of Onset  . Heart attack Mother   . Stroke Mother   . Aneurysm Father   . Colon cancer Neg Hx     CURRENT MEDICATIONS:  Outpatient Encounter Medications as of 06/09/2019  Medication Sig Note  . Ascorbic Acid (VITAMIN C) 1000 MG tablet Take 1,000 mg by mouth daily.   Marland Kitchen CALCIUM-MAGNESIUM PO Take 1 tablet by mouth every evening.    . Cholecalciferol (VITAMIN D3) 50 MCG (2000 UT) TABS Take 6,000 Units by mouth daily.   . fluticasone (CUTIVATE) 0.05 % cream Apply 1 application topically 2 (two) times daily as needed (lichen planus).   . fluticasone (FLONASE) 50 MCG/ACT nasal spray Place 1 spray into both nostrils daily as needed for allergies or rhinitis.   . hydrocortisone (ANUSOL-HC) 25 MG suppository Place 1 suppository (25 mg total) rectally every 12 (twelve) hours. 11/29/2018: On hand  . milk thistle 175 MG tablet Take 1,752 mg by mouth daily.   . Multiple Vitamin  (MULTIVITAMIN WITH MINERALS) TABS tablet Take 1 tablet by mouth once a week. In the morning.   . Multiple Vitamins-Minerals (HAIR/SKIN/NAILS/BIOTIN PO) Take 1 tablet by mouth daily.   . naproxen sodium (ALEVE) 220 MG tablet Take 220 mg by mouth 2 (two) times daily as needed (pain.).   Marland Kitchen OLIVE LEAF EXTRACT PO Take 1 tablet by mouth daily.   . Omega-3 Fatty Acids (FISH OIL) 1000 MG CAPS Take 1,000 mg by mouth daily.   Marland Kitchen PAU D ARCO PO Take 1 tablet by mouth daily.   . Probiotic CAPS Take 1 capsule by mouth daily.    Marland Kitchen VITAMIN A PO Take 1 tablet by mouth daily.    . vitamin B-12 (CYANOCOBALAMIN) 500 MCG tablet Take 500 mcg by mouth daily.   Marland Kitchen zinc gluconate 50 MG tablet Take 50 mg by mouth daily.    No facility-administered encounter medications on file as of 06/09/2019.    ALLERGIES:  No Known Allergies   Vital signs: -Deferred to telephone visit  Physical Exam -Deferred due to telephone visit -Patient was alert and oriented over the phone and in no acute distress.  LABORATORY DATA:  I have reviewed the labs as listed.  CBC    Component Value Date/Time   WBC 4.2 05/31/2019 0957   RBC 4.42 05/31/2019 0957   HGB 13.8 05/31/2019 0957   HCT 41.7 05/31/2019 0957   HCT 42 02/25/2012 1033   PLT 142 (L) 05/31/2019 0957   MCV 94.3 05/31/2019 0957   MCH 31.2 05/31/2019 0957   MCHC 33.1 05/31/2019 0957   RDW 13.1 05/31/2019 0957   LYMPHSABS 1.6 05/31/2019 0957   MONOABS 0.5 05/31/2019 0957   EOSABS 0.1 05/31/2019 0957   BASOSABS 0.0 05/31/2019 0957   CMP Latest Ref Rng & Units 05/31/2019 01/11/2019 01/11/2019  Glucose 70 - 99 mg/dL 94 - 100(H)  BUN 8 - 23 mg/dL 10 - 16  Creatinine 0.44 - 1.00 mg/dL 0.83 0.80 0.76  Sodium 135 - 145 mmol/L 140 - 140  Potassium 3.5 - 5.1 mmol/L 4.1 - 4.0  Chloride 98 - 111 mmol/L 104 - 105  CO2 22 - 32 mmol/L 28 - 26  Calcium 8.9 - 10.3 mg/dL 9.6 - 9.6  Total Protein 6.5 - 8.1 g/dL 7.7 - 8.2(H)  Total Bilirubin 0.3 - 1.2 mg/dL 0.7 - 0.6  Alkaline Phos  38 -  126 U/L 67 - 78  AST 15 - 41 U/L 23 - 26  ALT 0 - 44 U/L 22 - 25    All questions were answered to patient's stated satisfaction. Encouraged patient to call with any new concerns or questions before his next visit to the cancer center and we can certain see him sooner, if needed.     ASSESSMENT & PLAN:   Primary cancer of hepatic flexure of colon ([facility]) 1.  Stage II (T2N0) hepatic flexure colon adenocarcinoma: -Status post right colon segmental resection on 06/15/2017, 3.2 cm tumor, 0/12 lymph nodes positive.  MMR normal/MSI low. -CEA a month after surgery was elevated at 6.3.  CT of the chest did not show any metastatic disease. -PET/CT scan on 08/16/2017 did not show any evidence of recurrence or metastatic disease.  Her CEA elevation is likely from her liver disease.  She has a history of hepatitis C and was treated for it. -CT scan of the abdomen pelvis on 02/23/2018 showed postoperative changes of partial right colectomy without definite evidence of local or metastatic disease.  There is mild narrowing in the region of hepatic flexure, presumably at surgical anastomosis. -CEA on 06/01/2018 has increased to 8.9.  Prior CEA 3 months ago was 7.7. -PET CT scan on 06/22/2018 showed no findings of recurrent or metastatic colon cancer.  Asymmetric palatine tonsillar activity, left greater than right. -CT of the abdomen pelvis on 01/11/2019 was negative for any recurrent or metastatic disease. -Patient had a colonoscopy on 01/18/2019 they found 2 polyps that were negative for malignancy. -CEA slightly elevated at 8.0.  We will continue to monitor this closely. -Labs done on 05/31/2019 showed CEA level 8.8. -She will follow-up in 2 months with labs and a repeat CT [facility].  2.  Iron deficiency state: -Labs done on 05/31/2019 showed hemoglobin 13.8, WBC 4.2, platelets 142 -I do not recommend any IV iron at this time. -We will continue to monitor.  3.  Hepatitis C: -Patient had a blood transfusion  before 1974 was told she contracted hepatitis C. -She was treated 5 years ago for hepatitis C.      Orders placed this encounter:  Orders Placed This Encounter  Procedures  . CT ABDOMEN PELVIS W CONTRAST  . Lactate dehydrogenase  . CEA  . CBC with Differential/Platelet  . Comprehensive metabolic panel    I provided 18 minutes of non face-to-face telephone visit time during this encounter, and > 50% was spent counseling as documented under my assessment & plan.  Francene Finders, FNP-C [facility] [phone]

## 2019-06-09 NOTE — Assessment & Plan Note (Addendum)
1.  Stage II (T2N0) hepatic flexure colon adenocarcinoma: -Status post right colon segmental resection on 06/15/2017, 3.2 cm tumor, 0/12 lymph nodes positive.  MMR normal/MSI low. -CEA a month after surgery was elevated at 6.3.  CT of the chest did not show any metastatic disease. -PET/CT scan on 08/16/2017 did not show any evidence of recurrence or metastatic disease.  Her CEA elevation is likely from her liver disease.  She has a history of hepatitis C and was treated for it. -CT scan of the abdomen pelvis on 02/23/2018 showed postoperative changes of partial right colectomy without definite evidence of local or metastatic disease.  There is mild narrowing in the region of hepatic flexure, presumably at surgical anastomosis. -CEA on 06/01/2018 has increased to 8.9.  Prior CEA 3 months ago was 7.7. -PET CT scan on 06/22/2018 showed no findings of recurrent or metastatic colon cancer.  Asymmetric palatine tonsillar activity, left greater than right. -CT of the abdomen pelvis on 01/11/2019 was negative for any recurrent or metastatic disease. -Patient had a colonoscopy on 01/18/2019 they found 2 polyps that were negative for malignancy. -CEA slightly elevated at 8.0.  We will continue to monitor this closely. -Labs done on 05/31/2019 showed CEA level 8.8. -She will follow-up in 2 months with labs and a repeat CT [facility].  2.  Iron deficiency state: -Labs done on 05/31/2019 showed hemoglobin 13.8, WBC 4.2, platelets 142 -I do not recommend any IV iron at this time. -We will continue to monitor.  3.  Hepatitis C: -Patient had a blood transfusion before 1974 was told she contracted hepatitis C. -She was treated 5 years ago for hepatitis C.

## 2019-07-12 ENCOUNTER — Encounter: Payer: Self-pay | Admitting: Gastroenterology

## 2019-07-12 ENCOUNTER — Ambulatory Visit (INDEPENDENT_AMBULATORY_CARE_PROVIDER_SITE_OTHER): Payer: Medicare Other | Admitting: Gastroenterology

## 2019-07-12 ENCOUNTER — Other Ambulatory Visit: Payer: Self-pay

## 2019-07-12 VITALS — BP 115/73 | HR 73 | Temp 96.8°F | Ht 66.0 in | Wt 156.2 lb

## 2019-07-12 DIAGNOSIS — K219 Gastro-esophageal reflux disease without esophagitis: Secondary | ICD-10-CM | POA: Insufficient documentation

## 2019-07-12 DIAGNOSIS — K7460 Unspecified cirrhosis of liver: Secondary | ICD-10-CM | POA: Diagnosis not present

## 2019-07-12 DIAGNOSIS — L988 Other specified disorders of the skin and subcutaneous tissue: Secondary | ICD-10-CM | POA: Diagnosis not present

## 2019-07-12 MED ORDER — FLUCONAZOLE 150 MG PO TABS: 150.0000 mg | ORAL_TABLET | Freq: Every day | ORAL | 0 refills | Status: AC

## 2019-07-12 NOTE — Progress Notes (Signed)
Primary Care Physician: Sharilyn Sites, MD  Primary Gastroenterologist:  Garfield Cornea, MD   Chief Complaint  Patient presents with  . Cirrhosis    HPI: Ariana Long is a 64 y.o. female here for follow-up.  Last seen in September 2020.  Patient has a history of stage II colon cancer involving the hepatic flexure diagnosed in February 2019.  She underwent right colon segmental resection.  She has had mildly elevated CEA persisting postoperatively which is being monitored no evidence of recurrent or metastatic disease.  She also has a history of hepatitis C, status post eradication.  Prior MRI in 2015 with nodular liver border, F3/F4 on FibroSure.  EGD in 2017 negative for esophageal varices.  PET scan in April 2019 suggested shrunken appearance of the liver, nodular contour suggesting cirrhosis.  CT September 2020 with no evidence of hepatoma, status post right hemicolectomy, no evidence of recurrent or metastatic disease, nonobstructive right nephrolithiasis. MELD 6.  Patient underwent a colonoscopy in September 2020, several hyperplastic polyps removed, next surveillance colonoscopy in 5 years.  She was scheduled for EGD but wished to wait on esophageal variceal screening so procedure was not done.  Hemorrhoids flare at times.  Previously did not tolerate hemorrhoid banding.  She uses hemorrhoid suppository as needed. BM regular. No melena, brbpr.  No abdominal pain, no dysphagia, no vomiting.  Recently had reflux and was started on pantoprazole which is worked nicely.  She is concerned about a lesion that comes and goes in the gluteal crease area, painful and irritating.  Has been going on for a long time.  Current Outpatient Medications  Medication Sig Dispense Refill  . Ascorbic Acid (VITAMIN C) 1000 MG tablet Take 1,000 mg by mouth daily.    . Cholecalciferol (VITAMIN D3) 50 MCG (2000 UT) TABS Take 6,000 Units by mouth daily.    . fluticasone (CUTIVATE) 0.05 % cream Apply 1  application topically 2 (two) times daily as needed (lichen planus).    . fluticasone (FLONASE) 50 MCG/ACT nasal spray Place 1 spray into both nostrils daily as needed for allergies or rhinitis.    . hydrocortisone (ANUSOL-HC) 25 MG suppository Place 1 suppository (25 mg total) rectally every 12 (twelve) hours. (Patient taking differently: Place 25 mg rectally as needed. ) 12 suppository 1  . Magnesium 250 MG TABS Take 1 tablet by mouth daily.    . milk thistle 175 MG tablet Take 1,752 mg by mouth daily.    . Multiple Vitamin (MULTIVITAMIN WITH MINERALS) TABS tablet Take 1 tablet by mouth once a week. In the morning.    . Multiple Vitamins-Minerals (HAIR/SKIN/NAILS/BIOTIN PO) Take 1 tablet by mouth daily.    . naproxen sodium (ALEVE) 220 MG tablet Take 220 mg by mouth 2 (two) times daily as needed (pain.).    Marland Kitchen OLIVE LEAF EXTRACT PO Take 1 tablet by mouth daily.    . pantoprazole (PROTONIX) 40 MG tablet Take 40 mg by mouth daily.    . Probiotic CAPS Take 1 capsule by mouth daily.     Marland Kitchen VITAMIN A PO Take 1 tablet by mouth daily.     . vitamin B-12 (CYANOCOBALAMIN) 500 MCG tablet Take 500 mcg by mouth daily.    Marland Kitchen zinc gluconate 50 MG tablet Take 50 mg by mouth daily.     No current facility-administered medications for this visit.    Allergies as of 07/12/2019  . (No Known Allergies)    ROS:  General: Negative for  anorexia, weight loss, fever, chills, fatigue, weakness. ENT: Negative for hoarseness, difficulty swallowing , nasal congestion. CV: Negative for chest pain, angina, palpitations, dyspnea on exertion, peripheral edema.  Respiratory: Negative for dyspnea at rest, dyspnea on exertion, cough, sputum, wheezing.  GI: See history of present illness. GU:  Negative for dysuria, hematuria, urinary incontinence, urinary frequency, nocturnal urination.  Endo: Negative for unusual weight change.    Physical Examination:   BP 115/73   Pulse 73   Temp (!) 96.8 F (36 C) (Temporal)    Ht 5\' 6"  (1.676 m)   Wt 156 lb 3.2 oz (70.9 kg)   BMI 25.21 kg/m   General: Well-nourished, well-developed in no acute distress.  Eyes: No icterus. Mouth: masked Lungs: Clear to auscultation bilaterally.  Heart: Regular rate and rhythm, no murmurs rubs or gallops.  Abdomen: Bowel sounds are normal, nontender, nondistended, no hepatosplenomegaly or masses, no abdominal bruits or hernia , no rebound or guarding.   Rectal: Examination of gluteal crease shows 1 inch area of superficial, erythematous, tear-like lesion. No sinus tracks, fluctuants.  Extremities: No lower extremity edema. No clubbing or deformities. Neuro: Alert and oriented x 4   Skin: Warm and dry, no jaundice.   Psych: Alert and cooperative, normal mood and affect.  Labs:  Lab Results  Component Value Date   CREATININE 0.83 05/31/2019   BUN 10 05/31/2019   NA 140 05/31/2019   K 4.1 05/31/2019   CL 104 05/31/2019   CO2 28 05/31/2019   Lab Results  Component Value Date   ALT 22 05/31/2019   AST 23 05/31/2019   ALKPHOS 67 05/31/2019   BILITOT 0.7 05/31/2019   Lab Results  Component Value Date   WBC 4.2 05/31/2019   HGB 13.8 05/31/2019   HCT 41.7 05/31/2019   MCV 94.3 05/31/2019   PLT 142 (L) 05/31/2019   Lab Results  Component Value Date   INR 1.0 01/11/2019   INR 1.02 06/14/2017   INR 1.01 12/25/2010   No results found for: LIPASE  Imaging Studies: No results found.

## 2019-07-12 NOTE — Patient Instructions (Signed)
1. Please take our lab order with you when you get labs for Dr. Delton Coombes in May. We will follow up with you about results once received. I will also review your CT after completed.  2. Diflucan 150mg  daily for 7 days. 3. Return to the office in six months or call sooner if needed.

## 2019-07-12 NOTE — Assessment & Plan Note (Signed)
Doing well on pantoprazole 40 mg daily.  Reinforced antireflux measures.

## 2019-07-12 NOTE — Assessment & Plan Note (Signed)
Intermittent pain and irritation in the gluteal crease. Noted to have one inch area of superficial "tear-like" lesion with mild redness. ?related to moisture vs candida vs lichen planus. She is also complaining of vaginal yeast infection/itching. Trial of diflucan 150mg  daily for seven days. Call with persistent symptoms. Advised that if continues to have issues in gluteal crease, would consider applying her cream used for genital lichen planus to the area vs dermatology evaluation.

## 2019-07-12 NOTE — Assessment & Plan Note (Signed)
History of prior hepatitis C status post eradication.  Imaging consistent with cirrhosis, disease has been well compensated. MELD 6 several months ago.  No evidence of esophageal varices on 2017 EGD.  Patient was scheduled for EGD with recent colonoscopy but the daily procedure she wanted to put off a EGD for later date.  She is due for CT abdomen, labs with Dr. Delton Coombes in May.  We will follow-up on those findings.  Patient will also have PT/INR done for [place] (along with scheduled CMET) so we can calculate current MELD Na.  Return to the office in 6 months or sooner if needed.

## 2019-07-26 DIAGNOSIS — H43812 Vitreous degeneration, left eye: Secondary | ICD-10-CM | POA: Diagnosis not present

## 2019-07-26 DIAGNOSIS — H40013 Open angle with borderline findings, low risk, bilateral: Secondary | ICD-10-CM | POA: Diagnosis not present

## 2019-07-26 DIAGNOSIS — H35413 Lattice degeneration of retina, bilateral: Secondary | ICD-10-CM | POA: Diagnosis not present

## 2019-07-28 ENCOUNTER — Encounter (INDEPENDENT_AMBULATORY_CARE_PROVIDER_SITE_OTHER): Payer: Medicare Other | Admitting: Ophthalmology

## 2019-07-28 DIAGNOSIS — H35413 Lattice degeneration of retina, bilateral: Secondary | ICD-10-CM | POA: Diagnosis not present

## 2019-07-28 DIAGNOSIS — H43813 Vitreous degeneration, bilateral: Secondary | ICD-10-CM

## 2019-07-28 DIAGNOSIS — H2513 Age-related nuclear cataract, bilateral: Secondary | ICD-10-CM | POA: Diagnosis not present

## 2019-07-28 DIAGNOSIS — H33303 Unspecified retinal break, bilateral: Secondary | ICD-10-CM | POA: Diagnosis not present

## 2019-08-08 ENCOUNTER — Encounter (INDEPENDENT_AMBULATORY_CARE_PROVIDER_SITE_OTHER): Payer: Medicare Other | Admitting: Ophthalmology

## 2019-08-08 ENCOUNTER — Other Ambulatory Visit: Payer: Self-pay

## 2019-08-08 DIAGNOSIS — H33301 Unspecified retinal break, right eye: Secondary | ICD-10-CM

## 2019-08-21 ENCOUNTER — Encounter (INDEPENDENT_AMBULATORY_CARE_PROVIDER_SITE_OTHER): Payer: Medicare Other | Admitting: Ophthalmology

## 2019-08-21 ENCOUNTER — Other Ambulatory Visit: Payer: Self-pay

## 2019-08-21 DIAGNOSIS — H33303 Unspecified retinal break, bilateral: Secondary | ICD-10-CM

## 2019-08-31 DIAGNOSIS — L438 Other lichen planus: Secondary | ICD-10-CM | POA: Diagnosis not present

## 2019-08-31 DIAGNOSIS — L821 Other seborrheic keratosis: Secondary | ICD-10-CM | POA: Diagnosis not present

## 2019-09-05 ENCOUNTER — Other Ambulatory Visit (HOSPITAL_COMMUNITY)
Admission: RE | Admit: 2019-09-05 | Discharge: 2019-09-05 | Disposition: A | Discharge status: Home / Self Care | Payer: Medicare Other | Source: Ambulatory Visit | Attending: Gastroenterology | Admitting: Gastroenterology

## 2019-09-05 ENCOUNTER — Inpatient Hospital Stay (HOSPITAL_COMMUNITY): Payer: Medicare Other | Attending: Hematology

## 2019-09-05 ENCOUNTER — Other Ambulatory Visit: Payer: Self-pay

## 2019-09-05 ENCOUNTER — Ambulatory Visit (HOSPITAL_COMMUNITY)
Admission: RE | Admit: 2019-09-05 | Discharge: 2019-09-05 | Disposition: A | Discharge status: Home / Self Care | Payer: Medicare Other | Source: Ambulatory Visit | Attending: Nurse Practitioner | Admitting: Nurse Practitioner

## 2019-09-05 DIAGNOSIS — K8020 Calculus of gallbladder without cholecystitis without obstruction: Secondary | ICD-10-CM | POA: Diagnosis not present

## 2019-09-05 DIAGNOSIS — I700 Atherosclerosis of aorta: Secondary | ICD-10-CM | POA: Diagnosis not present

## 2019-09-05 DIAGNOSIS — Z8619 Personal history of other infectious and parasitic diseases: Secondary | ICD-10-CM | POA: Diagnosis not present

## 2019-09-05 DIAGNOSIS — N200 Calculus of kidney: Secondary | ICD-10-CM | POA: Insufficient documentation

## 2019-09-05 DIAGNOSIS — K7460 Unspecified cirrhosis of liver: Secondary | ICD-10-CM | POA: Diagnosis not present

## 2019-09-05 DIAGNOSIS — Z862 Personal history of diseases of the blood and blood-forming organs and certain disorders involving the immune mechanism: Secondary | ICD-10-CM | POA: Diagnosis not present

## 2019-09-05 DIAGNOSIS — C189 Malignant neoplasm of colon, unspecified: Secondary | ICD-10-CM | POA: Diagnosis not present

## 2019-09-05 DIAGNOSIS — C183 Malignant neoplasm of hepatic flexure: Secondary | ICD-10-CM | POA: Insufficient documentation

## 2019-09-05 LAB — CBC WITH DIFFERENTIAL/PLATELET
Abs Immature Granulocytes: 0.01 10*3/uL (ref 0.00–0.07)
Basophils Absolute: 0 10*3/uL (ref 0.0–0.1)
Basophils Relative: 1 %
Eosinophils Absolute: 0.1 10*3/uL (ref 0.0–0.5)
Eosinophils Relative: 3 %
HCT: 37.7 % (ref 36.0–46.0)
Hemoglobin: 12.2 g/dL (ref 12.0–15.0)
Immature Granulocytes: 0 %
Lymphocytes Relative: 38 %
Lymphs Abs: 1.4 10*3/uL (ref 0.7–4.0)
MCH: 30.4 pg (ref 26.0–34.0)
MCHC: 32.4 g/dL (ref 30.0–36.0)
MCV: 94 fL (ref 80.0–100.0)
Monocytes Absolute: 0.4 10*3/uL (ref 0.1–1.0)
Monocytes Relative: 11 %
Neutro Abs: 1.7 10*3/uL (ref 1.7–7.7)
Neutrophils Relative %: 47 %
Platelets: 148 10*3/uL — ABNORMAL LOW (ref 150–400)
RBC: 4.01 MIL/uL (ref 3.87–5.11)
RDW: 12.7 % (ref 11.5–15.5)
WBC: 3.6 10*3/uL — ABNORMAL LOW (ref 4.0–10.5)
nRBC: 0 % (ref 0.0–0.2)

## 2019-09-05 LAB — COMPREHENSIVE METABOLIC PANEL
ALT: 28 U/L (ref 0–44)
AST: 30 U/L (ref 15–41)
Albumin: 4 g/dL (ref 3.5–5.0)
Alkaline Phosphatase: 71 U/L (ref 38–126)
Anion gap: 11 (ref 5–15)
BUN: 15 mg/dL (ref 8–23)
CO2: 26 mmol/L (ref 22–32)
Calcium: 9.4 mg/dL (ref 8.9–10.3)
Chloride: 105 mmol/L (ref 98–111)
Creatinine, Ser: 0.7 mg/dL (ref 0.44–1.00)
GFR calc Af Amer: >60 mL/min (ref >60)
GFR calc non Af Amer: >60 mL/min (ref >60)
Glucose, Bld: 101 mg/dL — ABNORMAL HIGH (ref 70–99)
Potassium: 3.4 mmol/L — ABNORMAL LOW (ref 3.5–5.1)
Sodium: 142 mmol/L (ref 135–145)
Total Bilirubin: 0.6 mg/dL (ref 0.3–1.2)
Total Protein: 7.4 g/dL (ref 6.5–8.1)

## 2019-09-05 LAB — PROTIME-INR
INR: 1 (ref 0.8–1.2)
Prothrombin Time: 13 seconds (ref 11.4–15.2)

## 2019-09-05 LAB — LACTATE DEHYDROGENASE: LDH: 173 U/L (ref 98–192)

## 2019-09-05 MED ORDER — IOHEXOL 300 MG/ML  SOLN
100.0000 mL | Freq: Once | INTRAMUSCULAR | Status: AC | PRN
  Administered 2019-09-05: 10:00:00 100 mL via INTRAVENOUS

## 2019-09-06 LAB — CEA: CEA: 6.8 ng/mL — ABNORMAL HIGH (ref 0.0–4.7)

## 2019-09-08 ENCOUNTER — Ambulatory Visit (HOSPITAL_COMMUNITY): Payer: Medicare Other | Admitting: Nurse Practitioner

## 2019-09-12 DIAGNOSIS — H40013 Open angle with borderline findings, low risk, bilateral: Secondary | ICD-10-CM | POA: Diagnosis not present

## 2019-09-13 ENCOUNTER — Encounter (HOSPITAL_COMMUNITY): Payer: Self-pay | Admitting: Nurse Practitioner

## 2019-09-13 ENCOUNTER — Inpatient Hospital Stay (HOSPITAL_BASED_OUTPATIENT_CLINIC_OR_DEPARTMENT_OTHER): Payer: Medicare Other | Admitting: Nurse Practitioner

## 2019-09-13 ENCOUNTER — Other Ambulatory Visit: Payer: Self-pay

## 2019-09-13 DIAGNOSIS — Z862 Personal history of diseases of the blood and blood-forming organs and certain disorders involving the immune mechanism: Secondary | ICD-10-CM | POA: Diagnosis not present

## 2019-09-13 DIAGNOSIS — Z8619 Personal history of other infectious and parasitic diseases: Secondary | ICD-10-CM | POA: Diagnosis not present

## 2019-09-13 DIAGNOSIS — C183 Malignant neoplasm of hepatic flexure: Secondary | ICD-10-CM | POA: Diagnosis not present

## 2019-09-13 NOTE — Progress Notes (Signed)
[place], [place] [address]   CLINIC:  Medical Oncology/Hematology  PCP:  Ariana Long, [address] [place] [address] [phone]   REASON FOR VISIT: Follow-up for colon cancer   CURRENT THERAPY: Clinical observation  BRIEF ONCOLOGIC HISTORY:  Oncology History  Primary cancer of hepatic flexure of colon ([place])  06/15/2017 Initial Diagnosis   Primary cancer of hepatic flexure of colon ([place])   06/15/2017 Cancer Staging   Staging form: Colon and Rectum - Neuroendocine Tumors, AJCC 8th Edition - Clinical stage from 06/15/2017: Stage IIA (cT2, cN0, cM0) - Signed by Ariana Jack, MD on 08/18/2017     CANCER STAGING: Cancer Staging Primary cancer of hepatic flexure of colon ([place]) Staging form: Colon and Rectum - Neuroendocine Tumors, AJCC 8th Edition - Clinical stage from 06/15/2017: Stage IIA (cT2, cN0, cM0) - Signed by Ariana Jack, MD on 08/18/2017    INTERVAL HISTORY:  [name] 64 y.o. female returns for routine follow-up for colon cancer.  Patient reports she has been doing well since her last visit.  She has no complaints at this time.  She denies any bright red bleeding per rectum or melena.  She denies any easy bruising or bleeding.  She denies any change in stool patterns.  She denies any new abdominal pain.  Denies any nausea, vomiting, or diarrhea. Denies any new pains. Had not noticed any recent bleeding such as epistaxis, hematuria or hematochezia. Denies recent chest pain on exertion, shortness of breath on minimal exertion, pre-syncopal episodes, or palpitations. Denies any numbness or tingling in hands or feet. Denies any recent fevers, infections, or recent hospitalizations. Patient reports appetite at 100% and energy level at 100%.  She is eating well maintain her weight at this time.    REVIEW OF SYSTEMS:  Review of Systems  All other systems reviewed and are negative.    PAST  MEDICAL/SURGICAL HISTORY:  Past Medical History:  Diagnosis Date  . Cirrhosis of liver ([place]) 01/26/2011   FIBROSIS on liver biopsy in 2007, U/S  03/31/2013= mild diffuse hepatic steatosis and /or hepatocellular disease without focal hepatic parenchymal abnormality. per Dr. Patsy Long (2012) pt is immune to hep A and Hep B. per 12/06/13 note from Liver clinic-pt had MRI which showed cirrhosis 2015.  . Diverticulitis of colon   . Elevated AFP 01/26/2011  . Gall stones   . Hematuria 12/12/2014  . Hemorrhoid 12/01/2012  . Hemorrhoids   . Hepatitis C 01/26/2011   2005 non responder, genotype 1, Gr 1-2, Stage 3 . Treated with Harvoni at the Liver clinic, responder  . Hidradenitis   . History of hiatal hernia   . History of kidney stones   . Hx: UTI (urinary tract infection)   . Kidney stones 10/2011  . Osteoporosis   . Palpitations   . Primary cancer of hepatic flexure of colon ([place]) 06/15/2017  . Serrated adenoma of colon 11/2011  . Splenomegaly 01/26/2011  . Thrombocytopenia ([place]) 01/26/2011  . Tubular adenoma   . Vulval lesion 12/14/2013   Has kissing lesions thin and puffy and paler i color about 5-6- mm in diameter.Dr Ariana Long in to co examine will try temovate and recheck in 3 months   Past Surgical History:  Procedure Laterality Date  . AXILLARY SURGERY Bilateral 1979   "glands removed"  . BREAST BIOPSY Right 2011  . BREAST EXCISIONAL BIOPSY Bilateral 25 years ago   Benign, fatty tumors  . CESAREAN SECTION  x 2  . COLECTOMY  06/15/2017   LAPAROSCOPIC ASSISTED RIGHT COLECTOMY, ERAS PATHWAY/notes 06/15/2017  . COLONOSCOPY  07/31/2005   Rourk-Normal rectum/Normal colonoscopy/Repeat screening colonoscopy ten years  . COLONOSCOPY  11/16/2011   RMR: Friable anorectum-likely source of hematochezia/ LARGE 1.2x2.5CM SERRATED ADENOMA resected from ascending colon 7CM DISTAL TO ICV, hyperplastic polyp removed   . COLONOSCOPY N/A 07/18/2012   Dr. Gala Long- normal appearing rectal mucosa. no residual  polpy tissue seen at tatoo location. remainder of colonic mucosa appeared entirely normal. bx = tubular adenoma  . COLONOSCOPY N/A 08/29/2015   Dr. Gala Long: single 12 mm polyp in ascending colon, 4 mm polyp ascending. Sessile serrated adenoma. 3 year surveillance  . COLONOSCOPY N/A 05/19/2017   4X2 cm sessile lesion at hepatic flexdure, unable to be completely removed  . COLONOSCOPY N/A 01/18/2019   Dr. Gala Long: Several small polyps removed, hyperplastic.  Next colonoscopy in 5 years.  . ESOPHAGEAL BANDING N/A 08/29/2015   Procedure: ESOPHAGEAL BANDING;  Surgeon: Ariana Dolin, MD;  [facility];  Service: Endoscopy;  Laterality: N/A;  . ESOPHAGOGASTRODUODENOSCOPY N/A 08/29/2015   Dr. Gala Long: non-critical Schatzki's ring, somewhat prominent small submucosal vessels without obvious varices. hyperplastic gastric polyp. 2 year surveillance  . HEMORRHOID BANDING    . LAPAROSCOPIC RIGHT COLECTOMY N/A 06/15/2017   Procedure: LAPAROSCOPIC ASSISTED RIGHT COLECTOMY, ERAS PATHWAY;  Surgeon: Ariana Skates, MD;  Location: [facility];  Service: General;  Laterality: N/A;  . POLYPECTOMY  05/19/2017   Procedure: POLYPECTOMY;  Surgeon: Ariana Dolin, MD;  [facility];  Service: Endoscopy;;  . POLYPECTOMY  01/18/2019   Procedure: POLYPECTOMY;  Surgeon: Ariana Dolin, MD;  [facility];  Service: Endoscopy;;  . SKIN LESION EXCISION       SOCIAL HISTORY:  Social History   Socioeconomic History  . Marital status: Married    Spouse name: Not on file  . Number of children: 2  . Years of education: Not on file  . Highest education level: Not on file  Occupational History  . Occupation: Occupational psychologist: [employer]  Tobacco Use  . Smoking status: Never Smoker  . Smokeless tobacco: Never Used  Substance and Sexual Activity  . Alcohol use: No  . Drug use: No  . Sexual activity: Yes    Birth control/protection: Post-menopausal  Other Topics Concern  . Not on file    Social History Narrative  . Not on file   Social Determinants of Health   Financial Resource Strain:   . Difficulty of Paying Living Expenses:   Food Insecurity:   . Worried About Charity fundraiser in the Last Year:   . Arboriculturist in the Last Year:   Transportation Needs:   . Film/video editor (Medical):   Marland Kitchen Lack of Transportation (Non-Medical):   Physical Activity:   . Days of Exercise per Week:   . Minutes of Exercise per Session:   Stress:   . Feeling of Stress :   Social Connections:   . Frequency of Communication with Friends and Family:   . Frequency of Social Gatherings with Friends and Family:   . Attends Religious Services:   . Active Member of Clubs or Organizations:   . Attends Archivist Meetings:   Marland Kitchen Marital Status:   Intimate Partner Violence:   . Fear of Current or Ex-Partner:   . Emotionally Abused:   Marland Kitchen Physically Abused:   . Sexually Abused:  FAMILY HISTORY:  Family History  Problem Relation Age of Onset  . Heart attack Mother   . Stroke Mother   . Aneurysm Father   . Colon cancer Neg Hx     CURRENT MEDICATIONS:  Outpatient Encounter Medications as of 09/13/2019  Medication Sig Note  . Ascorbic Acid (VITAMIN C) 1000 MG tablet Take 1,000 mg by mouth daily.   . Cholecalciferol (VITAMIN D3) 50 MCG (2000 UT) TABS Take 6,000 Units by mouth daily.   . fluticasone (CUTIVATE) 0.05 % cream Apply 1 application topically 2 (two) times daily as needed (lichen planus).   . fluticasone (FLONASE) 50 MCG/ACT nasal spray Place 1 spray into both nostrils daily as needed for allergies or rhinitis.   . hydrocortisone (ANUSOL-HC) 25 MG suppository Place 1 suppository (25 mg total) rectally every 12 (twelve) hours. (Patient taking differently: Place 25 mg rectally as needed. ) 11/29/2018: On hand  . Magnesium 250 MG TABS Take 1 tablet by mouth daily.   . milk thistle 175 MG tablet Take 1,752 mg by mouth daily.   . Multiple Vitamin (MULTIVITAMIN  WITH MINERALS) TABS tablet Take 1 tablet by mouth once a week. In the morning.   . Multiple Vitamins-Minerals (HAIR/SKIN/NAILS/BIOTIN PO) Take 1 tablet by mouth daily.   . naproxen sodium (ALEVE) 220 MG tablet Take 220 mg by mouth 2 (two) times daily as needed (pain.).   Marland Kitchen OLIVE LEAF EXTRACT PO Take 1 tablet by mouth daily.   . pantoprazole (PROTONIX) 40 MG tablet Take 40 mg by mouth daily.   . Probiotic CAPS Take 1 capsule by mouth daily.    . Quercetin 250 MG TABS Take 500 mg by mouth.    Marland Kitchen VITAMIN A PO Take 1 tablet by mouth daily.    . vitamin B-12 (CYANOCOBALAMIN) 500 MCG tablet Take 500 mcg by mouth daily.   Marland Kitchen zinc gluconate 50 MG tablet Take 50 mg by mouth daily.    No facility-administered encounter medications on file as of 09/13/2019.    ALLERGIES:  No Known Allergies   PHYSICAL EXAM:  ECOG Performance status: 1  Vitals:   09/13/19 1100  BP: 128/81  Pulse: 67  Resp: 16  Temp: (!) 96.8 F (36 C)  SpO2: 99%   Filed Weights   09/13/19 1100  Weight: 160 lb 2 oz (72.6 kg)   Physical Exam Constitutional:      Appearance: Normal appearance. She is normal weight.  Abdominal:     Palpations: Abdomen is soft.  Musculoskeletal:        General: Normal range of motion.  Skin:    General: Skin is warm.  Neurological:     Mental Status: She is alert and oriented to person, place, and time. Mental status is at baseline.  Psychiatric:        Mood and Affect: Mood normal.        Behavior: Behavior normal.        Thought Content: Thought content normal.        Judgment: Judgment normal.      LABORATORY DATA:  I have reviewed the labs as listed.  CBC    Component Value Date/Time   WBC 3.6 (L) 09/05/2019 0909   RBC 4.01 09/05/2019 0909   HGB 12.2 09/05/2019 0909   HCT 37.7 09/05/2019 0909   HCT 42 02/25/2012 1033   PLT 148 (L) 09/05/2019 0909   MCV 94.0 09/05/2019 0909   MCH 30.4 09/05/2019 0909   MCHC 32.4 09/05/2019 0909  RDW 12.7 09/05/2019 0909    LYMPHSABS 1.4 09/05/2019 0909   MONOABS 0.4 09/05/2019 0909   EOSABS 0.1 09/05/2019 0909   BASOSABS 0.0 09/05/2019 0909   CMP Latest Ref Rng & Units 09/05/2019 05/31/2019 01/11/2019  Glucose 70 - 99 mg/dL 101(H) 94 -  BUN 8 - 23 mg/dL 15 10 -  Creatinine 0.44 - 1.00 mg/dL 0.70 0.83 0.80  Sodium 135 - 145 mmol/L 142 140 -  Potassium 3.5 - 5.1 mmol/L 3.4(L) 4.1 -  Chloride 98 - 111 mmol/L 105 104 -  CO2 22 - 32 mmol/L 26 28 -  Calcium 8.9 - 10.3 mg/dL 9.4 9.6 -  Total Protein 6.5 - 8.1 g/dL 7.4 7.7 -  Total Bilirubin 0.3 - 1.2 mg/dL 0.6 0.7 -  Alkaline Phos 38 - 126 U/L 71 67 -  AST 15 - 41 U/L 30 23 -  ALT 0 - 44 U/L 28 22 -    DIAGNOSTIC IMAGING:  I have independently reviewed the CT AP scans and discussed with the patient.  ASSESSMENT & PLAN:  Primary cancer of hepatic flexure of colon ([place]) 1.  Stage II (T2N0) hepatic flexure colon adenocarcinoma: -Status post right colon segmental resection on 06/15/2017, 3.2 cm tumor, 0/12 lymph nodes positive.  MMR normal/MSI low. -CEA a month after surgery was elevated at 6.3.  CT of the chest did not show any metastatic disease. -PET/CT scan on 08/16/2017 did not show any evidence of recurrence or metastatic disease.  Her CEA elevation is likely from her liver disease.  She has a history of hepatitis C and was treated for it. -CT scan of the abdomen pelvis on 02/23/2018 showed postoperative changes of partial right colectomy without definite evidence of local or metastatic disease.  There is mild narrowing in the region of hepatic flexure, presumably at surgical anastomosis. -CEA on 06/01/2018 has increased to 8.9.  Prior CEA 3 months ago was 7.7. -PET CT scan on 06/22/2018 showed no findings of recurrent or metastatic colon cancer.  Asymmetric palatine tonsillar activity, left greater than right. -CT of the abdomen pelvis on 01/11/2019 was negative for any recurrent or metastatic disease. -Patient had a colonoscopy on 01/18/2019 they found 2 polyps that  were negative for malignancy. -CEA slightly elevated at 8.0.  We will continue to monitor this closely. -Labs done on 05/31/2019 showed CEA level 8.8. -CT AP done on 09/05/2019 showed no evidence of recurrent malignancy. -Labs done on 09/05/2019 showed CEA level went down to 6.8. -She will follow-up in 4 months with labs.  2.  Iron deficiency state: -Labs done on 09/05/2019 showed hemoglobin 12.2, WBC 3.6, platelets 148 -I do not recommend any IV iron at this time. -We will continue to monitor.  3.  Hepatitis C: -Patient had a blood transfusion before 1974 was told she contracted hepatitis C. -She was treated 5 years ago for hepatitis C.     Orders placed this encounter:  Orders Placed This Encounter  Procedures  . CBC with Differential/Platelet  . Comprehensive metabolic panel  . Lactate dehydrogenase  . CEA      Francene Finders, [place] [phone]

## 2019-09-13 NOTE — Assessment & Plan Note (Signed)
1.  Stage II (T2N0) hepatic flexure colon adenocarcinoma: -Status post right colon segmental resection on 06/15/2017, 3.2 cm tumor, 0/12 lymph nodes positive.  MMR normal/MSI low. -CEA a month after surgery was elevated at 6.3.  CT of the chest did not show any metastatic disease. -PET/CT scan on 08/16/2017 did not show any evidence of recurrence or metastatic disease.  Her CEA elevation is likely from her liver disease.  She has a history of hepatitis C and was treated for it. -CT scan of the abdomen pelvis on 02/23/2018 showed postoperative changes of partial right colectomy without definite evidence of local or metastatic disease.  There is mild narrowing in the region of hepatic flexure, presumably at surgical anastomosis. -CEA on 06/01/2018 has increased to 8.9.  Prior CEA 3 months ago was 7.7. -PET CT scan on 06/22/2018 showed no findings of recurrent or metastatic colon cancer.  Asymmetric palatine tonsillar activity, left greater than right. -CT of the abdomen pelvis on 01/11/2019 was negative for any recurrent or metastatic disease. -Patient had a colonoscopy on 01/18/2019 they found 2 polyps that were negative for malignancy. -CEA slightly elevated at 8.0.  We will continue to monitor this closely. -Labs done on 05/31/2019 showed CEA level 8.8. -CT AP done on 09/05/2019 showed no evidence of recurrent malignancy. -Labs done on 09/05/2019 showed CEA level went down to 6.8. -She will follow-up in 4 months with labs.  2.  Iron deficiency state: -Labs done on 09/05/2019 showed hemoglobin 12.2, WBC 3.6, platelets 148 -I do not recommend any IV iron at this time. -We will continue to monitor.  3.  Hepatitis C: -Patient had a blood transfusion before 1974 was told she contracted hepatitis C. -She was treated 5 years ago for hepatitis C.

## 2019-09-13 NOTE — Patient Instructions (Signed)
[facility] at [facility] Discharge Instructions  Follow up in 4 months with labs    Thank you for choosing [facility] at [facility] to provide your oncology and hematology care.  To afford each patient quality time with our provider, please arrive at least 15 minutes before your scheduled appointment time.   If you have a lab appointment with the Cancer Center please come in thru the Main Entrance and check in at the main information desk.  You need to re-schedule your appointment should you arrive 10 or more minutes late.  We strive to give you quality time with our providers, and arriving late affects you and other patients whose appointments are after yours.  Also, if you no show three or more times for appointments you may be dismissed from the clinic at the providers discretion.     Again, thank you for choosing [facility].  Our hope is that these requests will decrease the amount of time that you wait before being seen by our physicians.       _____________________________________________________________  Should you have questions after your visit to [facility], please contact our office at [phone] between the hours of 8:00 a.m. and 4:30 p.m.  Voicemails left after 4:00 p.m. will not be returned until the following business day.  For prescription refill requests, have your pharmacy contact our office and allow 72 hours.    Due to Covid, you will need to wear a mask upon entering the hospital. If you do not have a mask, a mask will be given to you at the Main Entrance upon arrival. For doctor visits, patients may have 1 support person with them. For treatment visits, patients can not have anyone with them due to social distancing guidelines and our immunocompromised population.      

## 2019-09-22 DIAGNOSIS — Z6830 Body mass index (BMI) 30.0-30.9, adult: Secondary | ICD-10-CM | POA: Diagnosis not present

## 2019-09-22 DIAGNOSIS — E6609 Other obesity due to excess calories: Secondary | ICD-10-CM | POA: Diagnosis not present

## 2019-09-22 DIAGNOSIS — Z1389 Encounter for screening for other disorder: Secondary | ICD-10-CM | POA: Diagnosis not present

## 2019-09-22 DIAGNOSIS — M545 Low back pain: Secondary | ICD-10-CM | POA: Diagnosis not present

## 2019-09-22 DIAGNOSIS — M5410 Radiculopathy, site unspecified: Secondary | ICD-10-CM | POA: Diagnosis not present

## 2019-10-11 NOTE — Progress Notes (Deleted)
Date:  10/06/2019   ID:  Ariana Long, DOB Aug 04, 1955, MRN [id number]  PCP:  Sharilyn Sites, MD  Cardiologist:  Jenkins Rouge, MD  Electrophysiologist:  None   Evaluation Performed:  Follow-Up Visit  History of Present Illness:    Ariana Long is a 64 y.o. female with past medical history of palpitations (PAC's by prior monitoring), liver cirrhosis, Hepatitis C (treated) and colon cancer (s/p segmental resection in 06/2017) who presents for f/u    July 2020 complained of palpitations despite cutting back on caffeine Her monitor showed NSR with PVC's but no episodes of NSVT or PAF. It was recommended she start Toprol-XL 12.5mg  daily and titrate to 25mg  daily if symptoms did not improve. Does not appear that she started this   She has not been going to the [facility] due to [place] but does have a treadmill and recumbent bicycle at home. She plans to utilize these more regularly in the future.  ***  The patient does not have symptoms concerning for COVID-19 infection (fever, chills, cough, or new shortness of breath).    Past Medical History:  Diagnosis Date  . Cirrhosis of liver ([place]) 01/26/2011   FIBROSIS on liver biopsy in 2007, U/S  03/31/2013= mild diffuse hepatic steatosis and /or hepatocellular disease without focal hepatic parenchymal abnormality. per Dr. Patsy [place] (2012) pt is immune to hep A and Hep B. per 12/06/13 note from Liver clinic-pt had MRI which showed cirrhosis 2015.  . Diverticulitis of colon   . Elevated AFP 01/26/2011  . Gall stones   . Hematuria 12/12/2014  . Hemorrhoid 12/01/2012  . Hemorrhoids   . Hepatitis C 01/26/2011   2005 non responder, genotype 1, Gr 1-2, Stage 3 . Treated with Harvoni at the Liver clinic, responder  . Hidradenitis   . History of hiatal hernia   . History of kidney stones   . Hx: UTI (urinary tract infection)   . Kidney stones 10/2011  . Osteoporosis   . Palpitations   . Primary cancer of hepatic flexure of colon ([place]) 06/15/2017  . Serrated  adenoma of colon 11/2011  . Splenomegaly 01/26/2011  . Thrombocytopenia ([place]) 01/26/2011  . Tubular adenoma   . Vulval lesion 12/14/2013   Has kissing lesions thin and puffy and paler i color about 5-6- mm in diameter.Dr Glo Herring in to co examine will try temovate and recheck in 3 months   Past Surgical History:  Procedure Laterality Date  . AXILLARY SURGERY Bilateral 1979   "glands removed"  . BREAST BIOPSY Right 2011  . BREAST EXCISIONAL BIOPSY Bilateral 25 years ago   Benign, fatty tumors  . CESAREAN SECTION     x 2  . COLECTOMY  06/15/2017   LAPAROSCOPIC ASSISTED RIGHT COLECTOMY, ERAS PATHWAY/notes 06/15/2017  . COLONOSCOPY  07/31/2005   Rourk-Normal rectum/Normal colonoscopy/Repeat screening colonoscopy ten years  . COLONOSCOPY  11/16/2011   RMR: Friable anorectum-likely source of hematochezia/ LARGE 1.2x2.5CM SERRATED ADENOMA resected from ascending colon 7CM DISTAL TO ICV, hyperplastic polyp removed   . COLONOSCOPY N/A 07/18/2012   Dr. Gala Romney- normal appearing rectal mucosa. no residual polpy tissue seen at tatoo location. remainder of colonic mucosa appeared entirely normal. bx = tubular adenoma  . COLONOSCOPY N/A 08/29/2015   Dr. Gala Romney: single 12 mm polyp in ascending colon, 4 mm polyp ascending. Sessile serrated adenoma. 3 year surveillance  . COLONOSCOPY N/A 05/19/2017   4X2 cm sessile lesion at hepatic flexdure, unable to be completely removed  . COLONOSCOPY N/A 01/18/2019  Dr. Gala Romney: Several small polyps removed, hyperplastic.  Next colonoscopy in 5 years.  . ESOPHAGEAL BANDING N/A 08/29/2015   Procedure: ESOPHAGEAL BANDING;  Surgeon: Daneil Dolin, MD;  [facility];  Service: Endoscopy;  Laterality: N/A;  . ESOPHAGOGASTRODUODENOSCOPY N/A 08/29/2015   Dr. Gala Romney: non-critical Schatzki's ring, somewhat prominent small submucosal vessels without obvious varices. hyperplastic gastric polyp. 2 year surveillance  . HEMORRHOID BANDING    . LAPAROSCOPIC RIGHT COLECTOMY N/A  06/15/2017   Procedure: LAPAROSCOPIC ASSISTED RIGHT COLECTOMY, ERAS PATHWAY;  Surgeon: Fanny Skates, MD;  Location: [facility];  Service: General;  Laterality: N/A;  . POLYPECTOMY  05/19/2017   Procedure: POLYPECTOMY;  Surgeon: Daneil Dolin, MD;  [facility];  Service: Endoscopy;;  . POLYPECTOMY  01/18/2019   Procedure: POLYPECTOMY;  Surgeon: Daneil Dolin, MD;  [facility];  Service: Endoscopy;;  . SKIN LESION EXCISION       No outpatient medications have been marked as taking for the 10/12/19 encounter (Appointment) with Josue Hector, MD.     Allergies:   Patient has no known allergies.   Social History   Tobacco Use  . Smoking status: Never Smoker  . Smokeless tobacco: Never Used  Substance Use Topics  . Alcohol use: No  . Drug use: No     Family Hx: The patient's family history includes Aneurysm in her father; Heart attack in her mother; Stroke in her mother. There is no history of Colon cancer.  ROS:   Please see the history of present illness.     All other systems reviewed and are negative.   Prior CV studies:   The following studies were reviewed today:  Echocardiogram: 10/2012 Study Conclusions   - Left ventricle: The cavity size was normal. Wall thickness  was normal. Systolic function was normal. The estimated  ejection fraction was in the range of 55% to 60%. Wall  motion was normal; there were no regional wall motion  abnormalities. Left ventricular diastolic function  parameters were normal.   Event Monitor: 11/2018 NSR Symptomatic PVC;s No significant NSVT or PaF  Labs/Other Tests and Data Reviewed:    EKG:   SR rate 58 normal 07/05/17   Recent Labs: 09/05/2019: ALT 28; BUN 15; Creatinine, Ser 0.70; Hemoglobin 12.2; Platelets 148; Potassium 3.4; Sodium 142   Recent Lipid Panel No results found for: CHOL, TRIG, HDL, CHOLHDL, LDLCALC, LDLDIRECT  Wt Readings from Last 3 Encounters:  09/13/19 160 lb 2 oz (72.6  kg)  07/12/19 156 lb 3.2 oz (70.9 kg)  04/06/19 154 lb (69.9 kg)     Objective:    Vital Signs:  There were no vitals taken for this visit.   Affect appropriate Healthy:  appears stated age 43: normal Neck supple with no adenopathy JVP normal no bruits no thyromegaly Lungs clear with no wheezing and good diaphragmatic motion Heart:  S1/S2 no murmur, no rub, gallop or click PMI normal Abdomen: benighn, BS positve, no tenderness, no AAA no bruit.  No HSM or HJR Distal pulses intact with no bruits No edema Neuro non-focal Skin warm and dry No muscular weakness   ASSESSMENT & PLAN:    1. Palpitations - She reports her palpitations have mostly resolved since reducing her caffeine intake. Does not consume alcohol. She does have Toprol-XL to take as needed for palpitations but has not had to utilize this regularly. Continued reduction of caffeine intake was recommended. Will update echo since she has not had one  since 2014  2. Colon Cancer:  Colectomy 06/15/17 CEA elevated ? From history of hepatitis C CT 09/05/19 no evidence of recurrence f/u with oncology   3. Chest Pain:  ***  COVID-19 Education: The signs and symptoms of COVID-19 were discussed with the patient and how to seek care for testing (follow up with PCP or arrange E-visit).  The importance of social distancing was discussed today.   Medication Adjustments/Labs and Tests Ordered: Current medicines are reviewed at length with the patient today.  Concerns regarding medicines are outlined above.   Tests Ordered: ***  Medication Changes: No orders of the defined types were placed in this encounter.   Follow Up:  One year   Signed, Jenkins Rouge, MD  10/06/2019 12:02 PM    [place]

## 2019-10-12 ENCOUNTER — Ambulatory Visit: Payer: Medicare Other | Admitting: Cardiovascular Disease

## 2019-10-13 ENCOUNTER — Telehealth: Payer: Self-pay

## 2019-10-13 DIAGNOSIS — K649 Unspecified hemorrhoids: Secondary | ICD-10-CM

## 2019-10-13 MED ORDER — HYDROCORTISONE (PERIANAL) 1 % EX CREA: 1.0000 "application " | TOPICAL_CREAM | Freq: Two times a day (BID) | CUTANEOUS | 2 refills | Status: DC | PRN

## 2019-10-13 NOTE — Addendum Note (Signed)
Addended by: Gordy Levan, Jamall Strohmeier A on: 10/13/2019 12:03 PM   Modules accepted: Orders

## 2019-10-13 NOTE — Telephone Encounter (Signed)
I feel it is most likely her hemorrhoids. She has an upcoming appointment with [facility] in October. Let's have her check back beginning of the week and if still with persistent bleeding can try and bump up her appointment. I can send in topical therapy (Aunsol rectal cream) if she would like, to use in the meantime.  ER precautions: Go to the ER for worsening/large volume bleeding, worsening fatigue, weakness, dizziness, lightheadedness, chest pain, shortness of breath, passing out, nearly passing out.

## 2019-10-13 NOTE — Telephone Encounter (Signed)
Noted. Spoke with pt. Pt will give a call back next week with updated symptoms. Pt would like to try the Anusol. Please send med to [facility]. Pt is aware and will look out for ER precautions.

## 2019-10-13 NOTE — Telephone Encounter (Signed)
Pt called with c/o a little blood on the tissue after she has a BM. Pt doesn't always see the blood when wiping but has noticed it several times. Pt isn't straining when having a BM and states she has had a history of hemorrhoids. Pt isn't having any pain, hasn't noticed any blood in the toilet, stool isn't dark/black, no lightheadedness seen. Pt wanted to mention this due to her hx of colon cancer. Pt is aware that our office is open for half a day today and pt is aware that if any of her symptoms change or worsen, she should go to the ED. [phone]

## 2019-10-13 NOTE — Telephone Encounter (Signed)
Rx sent per request. 

## 2019-10-15 NOTE — Telephone Encounter (Signed)
noted 

## 2019-10-17 ENCOUNTER — Ambulatory Visit: Payer: Medicare Other | Admitting: Cardiovascular Disease

## 2019-10-20 DIAGNOSIS — B351 Tinea unguium: Secondary | ICD-10-CM | POA: Diagnosis not present

## 2019-12-20 DIAGNOSIS — L438 Other lichen planus: Secondary | ICD-10-CM | POA: Diagnosis not present

## 2019-12-22 ENCOUNTER — Ambulatory Visit: Payer: Medicare Other | Admitting: Gastroenterology

## 2019-12-25 ENCOUNTER — Encounter (INDEPENDENT_AMBULATORY_CARE_PROVIDER_SITE_OTHER): Payer: Medicare Other | Admitting: Ophthalmology

## 2020-01-02 ENCOUNTER — Ambulatory Visit: Payer: Medicare Other | Admitting: Internal Medicine

## 2020-01-15 ENCOUNTER — Inpatient Hospital Stay (HOSPITAL_COMMUNITY): Payer: Medicare Other | Attending: Hematology

## 2020-01-15 ENCOUNTER — Other Ambulatory Visit: Payer: Self-pay

## 2020-01-15 DIAGNOSIS — C183 Malignant neoplasm of hepatic flexure: Secondary | ICD-10-CM

## 2020-01-15 DIAGNOSIS — Z85038 Personal history of other malignant neoplasm of large intestine: Secondary | ICD-10-CM | POA: Diagnosis not present

## 2020-01-15 LAB — CBC WITH DIFFERENTIAL/PLATELET
Abs Immature Granulocytes: 0.01 10*3/uL (ref 0.00–0.07)
Basophils Absolute: 0.1 10*3/uL (ref 0.0–0.1)
Basophils Relative: 1 %
Eosinophils Absolute: 0.1 10*3/uL (ref 0.0–0.5)
Eosinophils Relative: 3 %
HCT: 39.7 % (ref 36.0–46.0)
Hemoglobin: 13 g/dL (ref 12.0–15.0)
Immature Granulocytes: 0 %
Lymphocytes Relative: 37 %
Lymphs Abs: 1.5 10*3/uL (ref 0.7–4.0)
MCH: 30.6 pg (ref 26.0–34.0)
MCHC: 32.7 g/dL (ref 30.0–36.0)
MCV: 93.4 fL (ref 80.0–100.0)
Monocytes Absolute: 0.4 10*3/uL (ref 0.1–1.0)
Monocytes Relative: 11 %
Neutro Abs: 1.9 10*3/uL (ref 1.7–7.7)
Neutrophils Relative %: 48 %
Platelets: 163 10*3/uL (ref 150–400)
RBC: 4.25 MIL/uL (ref 3.87–5.11)
RDW: 13.4 % (ref 11.5–15.5)
WBC: 4 10*3/uL (ref 4.0–10.5)
nRBC: 0 % (ref 0.0–0.2)

## 2020-01-15 LAB — LACTATE DEHYDROGENASE: LDH: 182 U/L (ref 98–192)

## 2020-01-15 LAB — COMPREHENSIVE METABOLIC PANEL
ALT: 28 U/L (ref 0–44)
AST: 32 U/L (ref 15–41)
Albumin: 4.3 g/dL (ref 3.5–5.0)
Alkaline Phosphatase: 66 U/L (ref 38–126)
Anion gap: 9 (ref 5–15)
BUN: 17 mg/dL (ref 8–23)
CO2: 25 mmol/L (ref 22–32)
Calcium: 9.6 mg/dL (ref 8.9–10.3)
Chloride: 108 mmol/L (ref 98–111)
Creatinine, Ser: 0.76 mg/dL (ref 0.44–1.00)
GFR calc Af Amer: >60 mL/min (ref >60)
GFR calc non Af Amer: >60 mL/min (ref >60)
Glucose, Bld: 93 mg/dL (ref 70–99)
Potassium: 3.7 mmol/L (ref 3.5–5.1)
Sodium: 142 mmol/L (ref 135–145)
Total Bilirubin: 0.7 mg/dL (ref 0.3–1.2)
Total Protein: 7.6 g/dL (ref 6.5–8.1)

## 2020-01-16 ENCOUNTER — Inpatient Hospital Stay (HOSPITAL_COMMUNITY): Payer: Medicare Other

## 2020-01-16 LAB — CEA: CEA: 7.7 ng/mL — ABNORMAL HIGH (ref 0.0–4.7)

## 2020-01-22 ENCOUNTER — Inpatient Hospital Stay (HOSPITAL_COMMUNITY): Payer: Medicare Other | Attending: Hematology | Admitting: Hematology

## 2020-01-22 ENCOUNTER — Other Ambulatory Visit: Payer: Self-pay

## 2020-01-22 VITALS — BP 113/78 | HR 60 | Temp 96.9°F | Resp 18 | Wt 154.5 lb

## 2020-01-22 DIAGNOSIS — K7460 Unspecified cirrhosis of liver: Secondary | ICD-10-CM | POA: Insufficient documentation

## 2020-01-22 DIAGNOSIS — Z85038 Personal history of other malignant neoplasm of large intestine: Secondary | ICD-10-CM | POA: Insufficient documentation

## 2020-01-22 DIAGNOSIS — B1920 Unspecified viral hepatitis C without hepatic coma: Secondary | ICD-10-CM | POA: Diagnosis not present

## 2020-01-22 DIAGNOSIS — C183 Malignant neoplasm of hepatic flexure: Secondary | ICD-10-CM

## 2020-01-22 DIAGNOSIS — Z08 Encounter for follow-up examination after completed treatment for malignant neoplasm: Secondary | ICD-10-CM | POA: Insufficient documentation

## 2020-01-22 NOTE — Progress Notes (Signed)
Ariana Long, Ariana Long   CLINIC:  Medical Oncology/Hematology  PCP:  Sharilyn Sites, [address] / [place] [address] [phone]   REASON FOR VISIT:  Follow-up for stage II colon cancer  PRIOR THERAPY: Right colon segmental resection on 06/15/2017  NGS Results: Not done  CURRENT THERAPY: Observation  BRIEF ONCOLOGIC HISTORY:  Oncology History  Primary cancer of hepatic flexure of colon ([place])  06/15/2017 Initial Diagnosis   Primary cancer of hepatic flexure of colon ([place])   06/15/2017 Cancer Staging   Staging form: Colon and Rectum - Neuroendocine Tumors, AJCC 8th Edition - Clinical stage from 06/15/2017: Stage IIA (cT2, cN0, cM0) - Signed by Derek Jack, MD on 08/18/2017     CANCER STAGING: Cancer Staging Primary cancer of hepatic flexure of colon [facility]) Staging form: Colon and Rectum - Neuroendocine Tumors, AJCC 8th Edition - Clinical stage from 06/15/2017: Stage IIA (cT2, cN0, cM0) - Signed by Derek Jack, MD on 08/18/2017   INTERVAL HISTORY:  Ariana Long, a 64 y.o. female, returns for routine follow-up of her stage II colon cancer. Ariana Long was last seen on 10/12/2018.   Today she reports feeling well. She denies having any new pains, changes in BM's, or hematochezia. She has been trying to put on more weight and is lifting weights.  Her last colonoscopy was on 01/18/2019.   REVIEW OF SYSTEMS:  Review of Systems  Constitutional: Positive for fatigue (mild). Negative for appetite change.  Cardiovascular: Positive for palpitations.  Gastrointestinal: Negative for blood in stool.  Musculoskeletal: Negative for arthralgias.  All other systems reviewed and are negative.   PAST MEDICAL/SURGICAL HISTORY:  Past Medical History:  Diagnosis Date  . Cirrhosis of liver ([place]) 01/26/2011   FIBROSIS on liver biopsy in 2007, U/S  03/31/2013= mild diffuse hepatic steatosis and /or hepatocellular  disease without focal hepatic parenchymal abnormality. per Dr. Patsy [place] (2012) pt is immune to hep A and Hep B. per 12/06/13 note from Liver clinic-pt had MRI which showed cirrhosis 2015.  . Diverticulitis of colon   . Elevated AFP 01/26/2011  . Gall stones   . Hematuria 12/12/2014  . Hemorrhoid 12/01/2012  . Hemorrhoids   . Hepatitis C 01/26/2011   2005 non responder, genotype 1, Gr 1-2, Stage 3 . Treated with Harvoni at the Liver clinic, responder  . Hidradenitis   . History of hiatal hernia   . History of kidney stones   . Hx: UTI (urinary tract infection)   . Kidney stones 10/2011  . Osteoporosis   . Palpitations   . Primary cancer of hepatic flexure of colon ([place]) 06/15/2017  . Serrated adenoma of colon 11/2011  . Splenomegaly 01/26/2011  . Thrombocytopenia ([place]) 01/26/2011  . Tubular adenoma   . Vulval lesion 12/14/2013   Has kissing lesions thin and puffy and paler i color about 5-6- mm in diameter.Dr Glo Herring in to co examine will try temovate and recheck in 3 months   Past Surgical History:  Procedure Laterality Date  . AXILLARY SURGERY Bilateral 1979   "glands removed"  . BREAST BIOPSY Right 2011  . BREAST EXCISIONAL BIOPSY Bilateral 25 years ago   Benign, fatty tumors  . CESAREAN SECTION     x 2  . COLECTOMY  06/15/2017   LAPAROSCOPIC ASSISTED RIGHT COLECTOMY, ERAS PATHWAY/notes 06/15/2017  . COLONOSCOPY  07/31/2005   [facility]-Normal rectum/Normal colonoscopy/Repeat screening colonoscopy ten years  . COLONOSCOPY  11/16/2011   RMR: Friable anorectum-likely source  of hematochezia/ LARGE 1.2x2.5CM SERRATED ADENOMA resected from ascending colon 7CM DISTAL TO ICV, hyperplastic polyp removed   . COLONOSCOPY N/A 07/18/2012   Dr. Gala Romney- normal appearing rectal mucosa. no residual polpy tissue seen at tatoo location. remainder of colonic mucosa appeared entirely normal. bx = tubular adenoma  . COLONOSCOPY N/A 08/29/2015   Dr. Gala Romney: single 12 mm polyp in ascending colon, 4 mm polyp ascending.  Sessile serrated adenoma. 3 year surveillance  . COLONOSCOPY N/A 05/19/2017   4X2 cm sessile lesion at hepatic flexdure, unable to be completely removed  . COLONOSCOPY N/A 01/18/2019   Dr. Gala Romney: Several small polyps removed, hyperplastic.  Next colonoscopy in 5 years.  . ESOPHAGEAL BANDING N/A 08/29/2015   Procedure: ESOPHAGEAL BANDING;  Surgeon: Daneil Dolin, MD;  [facility];  Service: Endoscopy;  Laterality: N/A;  . ESOPHAGOGASTRODUODENOSCOPY N/A 08/29/2015   Dr. Gala Romney: non-critical Schatzki's ring, somewhat prominent small submucosal vessels without obvious varices. hyperplastic gastric polyp. 2 year surveillance  . HEMORRHOID BANDING    . LAPAROSCOPIC RIGHT COLECTOMY N/A 06/15/2017   Procedure: LAPAROSCOPIC ASSISTED RIGHT COLECTOMY, ERAS PATHWAY;  Surgeon: Fanny Skates, MD;  Location: [facility];  Service: General;  Laterality: N/A;  . POLYPECTOMY  05/19/2017   Procedure: POLYPECTOMY;  Surgeon: Daneil Dolin, MD;  [facility];  Service: Endoscopy;;  . POLYPECTOMY  01/18/2019   Procedure: POLYPECTOMY;  Surgeon: Daneil Dolin, MD;  [facility];  Service: Endoscopy;;  . SKIN LESION EXCISION      SOCIAL HISTORY:  Social History   Socioeconomic History  . Marital status: Married    Spouse name: Not on file  . Number of children: 2  . Years of education: Not on file  . Highest education level: Not on file  Occupational History  . Occupation: Occupational psychologist: [employer]  Tobacco Use  . Smoking status: Never Smoker  . Smokeless tobacco: Never Used  Vaping Use  . Vaping Use: Never used  Substance and Sexual Activity  . Alcohol use: No  . Drug use: No  . Sexual activity: Yes    Birth control/protection: Post-menopausal  Other Topics Concern  . Not on file  Social History Narrative  . Not on file   Social Determinants of Health   Financial Resource Strain:   . Difficulty of Paying Living Expenses: Not on file  Food  Insecurity:   . Worried About Charity fundraiser in the Last Year: Not on file  . Ran Out of Food in the Last Year: Not on file  Transportation Needs:   . Lack of Transportation (Medical): Not on file  . Lack of Transportation (Non-Medical): Not on file  Physical Activity:   . Days of Exercise per Week: Not on file  . Minutes of Exercise per Session: Not on file  Stress:   . Feeling of Stress : Not on file  Social Connections:   . Frequency of Communication with Friends and Family: Not on file  . Frequency of Social Gatherings with Friends and Family: Not on file  . Attends Religious Services: Not on file  . Active Member of Clubs or Organizations: Not on file  . Attends Archivist Meetings: Not on file  . Marital Status: Not on file  Intimate Partner Violence:   . Fear of Current or Ex-Partner: Not on file  . Emotionally Abused: Not on file  . Physically Abused: Not on file  . Sexually Abused:  Not on file    FAMILY HISTORY:  Family History  Problem Relation Age of Onset  . Heart attack Mother   . Stroke Mother   . Aneurysm Father   . Colon cancer Neg Hx     CURRENT MEDICATIONS:  Current Outpatient Medications  Medication Sig Dispense Refill  . Ascorbic Acid (VITAMIN C) 1000 MG tablet Take 1,000 mg by mouth daily.    . Cholecalciferol (VITAMIN D3) 50 MCG (2000 UT) TABS Take 6,000 Units by mouth daily.    . fluticasone (CUTIVATE) 0.05 % cream Apply 1 application topically 2 (two) times daily as needed (lichen planus).    . fluticasone (FLONASE) 50 MCG/ACT nasal spray Place 1 spray into both nostrils daily as needed for allergies or rhinitis.    . hydrocortisone (ANUSOL-HC) 25 MG suppository Place 1 suppository (25 mg total) rectally every 12 (twelve) hours. (Patient taking differently: Place 25 mg rectally as needed. ) 12 suppository 1  . Hydrocortisone, Perianal, (PROCTO-PAK) 1 % CREA Apply 1 application topically 2 (two) times daily as needed. Use for up to 10  days at a time 28 g 2  . Magnesium 250 MG TABS Take 1 tablet by mouth daily.    . milk thistle 175 MG tablet Take 1,752 mg by mouth daily.    . Multiple Vitamin (MULTIVITAMIN WITH MINERALS) TABS tablet Take 1 tablet by mouth once a week. In the morning.    . Multiple Vitamins-Minerals (HAIR/SKIN/NAILS/BIOTIN PO) Take 1 tablet by mouth daily.    . naproxen sodium (ALEVE) 220 MG tablet Take 220 mg by mouth 2 (two) times daily as needed (pain.).    Marland Kitchen OLIVE LEAF EXTRACT PO Take 1 tablet by mouth daily.    . pantoprazole (PROTONIX) 40 MG tablet Take 40 mg by mouth daily.    . Probiotic CAPS Take 1 capsule by mouth daily.     . Quercetin 250 MG TABS Take 500 mg by mouth.     Marland Kitchen VITAMIN A PO Take 1 tablet by mouth daily.     . vitamin B-12 (CYANOCOBALAMIN) 500 MCG tablet Take 500 mcg by mouth daily.    Marland Kitchen zinc gluconate 50 MG tablet Take 50 mg by mouth daily.     No current facility-administered medications for this visit.    ALLERGIES:  No Known Allergies  PHYSICAL EXAM:  Performance status (ECOG): 0 - Asymptomatic  Vitals:   01/22/20 1028  BP: 113/78  Pulse: 60  Resp: 18  Temp: (!) 96.9 F (36.1 C)  SpO2: 97%   Wt Readings from Last 3 Encounters:  01/22/20 154 lb 8 oz (70.1 kg)  09/13/19 160 lb 2 oz (72.6 kg)  07/12/19 156 lb 3.2 oz (70.9 kg)   Physical Exam Vitals reviewed.  Constitutional:      Appearance: Normal appearance.  Cardiovascular:     Rate and Rhythm: Normal rate and regular rhythm.     Pulses: Normal pulses.     Heart sounds: Normal heart sounds.  Pulmonary:     Effort: Pulmonary effort is normal.     Breath sounds: Normal breath sounds.  Abdominal:     Palpations: Abdomen is soft. There is no mass.     Tenderness: There is no abdominal tenderness.  Musculoskeletal:     Right lower leg: No edema.     Left lower leg: No edema.  Neurological:     General: No focal deficit present.     Mental Status: She is alert and oriented to  person, place, and time.    Psychiatric:        Mood and Affect: Mood normal.        Behavior: Behavior normal.      LABORATORY DATA:  I have reviewed the labs as listed.  CBC Latest Ref Rng & Units 01/15/2020 09/05/2019 05/31/2019  WBC 4.0 - 10.5 K/uL 4.0 3.6(L) 4.2  Hemoglobin 12.0 - 15.0 g/dL 13.0 12.2 13.8  Hematocrit 36 - 46 % 39.7 37.7 41.7  Platelets 150 - 400 K/uL 163 148(L) 142(L)   CMP Latest Ref Rng & Units 01/15/2020 09/05/2019 05/31/2019  Glucose 70 - 99 mg/dL 93 101(H) 94  BUN 8 - 23 mg/dL '17 15 10  ' Creatinine 0.44 - 1.00 mg/dL 0.76 0.70 0.83  Sodium 135 - 145 mmol/L 142 142 140  Potassium 3.5 - 5.1 mmol/L 3.7 3.4(L) 4.1  Chloride 98 - 111 mmol/L 108 105 104  CO2 22 - 32 mmol/L '25 26 28  ' Calcium 8.9 - 10.3 mg/dL 9.6 9.4 9.6  Total Protein 6.5 - 8.1 g/dL 7.6 7.4 7.7  Total Bilirubin 0.3 - 1.2 mg/dL 0.7 0.6 0.7  Alkaline Phos 38 - 126 U/L 66 71 67  AST 15 - 41 U/L 32 30 23  ALT 0 - 44 U/L '28 28 22   ' Lab Results  Component Value Date   LDH 182 01/15/2020   LDH 173 09/05/2019   Lab Results  Component Value Date   CEA1 7.7 (H) 01/15/2020   CEA1 6.8 (H) 09/05/2019   CEA1 8.8 (H) 05/31/2019    DIAGNOSTIC IMAGING:  I have independently reviewed the scans and discussed with the patient. No results found.   ASSESSMENT:  1.  Stage II (T2N0) hepatic flexure colon adenocarcinoma: -Status post right colon segmental resection on 06/15/2017, 3.2 cm tumor, 0/12 lymph nodes positive.  MMR normal/MSI low. -CEA a month after surgery was elevated at 6.3.  CT of the chest did not show any metastatic disease. -PET/CT scan on 08/16/2017 did not show any evidence of recurrence or metastatic disease.  Her CEA elevation is likely from her liver disease.  She has a history of hepatitis C and was treated for it. -CT scan of the abdomen pelvis on 02/23/2018 showed postoperative changes of partial right colectomy without definite evidence of local or metastatic disease.  There is mild narrowing in the region of hepatic  flexure, presumably at surgical anastomosis. -CEA on 06/01/2018 has increased to 8.9.  Prior CEA 3 months ago was 7.7. -PET CT scan on 06/22/2018 showed no findings of recurrent or metastatic colon cancer.  Asymmetric palatine tonsillar activity, left greater than right. -CT of the abdomen pelvis on 01/11/2019 was negative for any recurrent or metastatic disease. -Patient had a colonoscopy on 01/18/2019 they found 2 polyps that were negative for malignancy. -CEA slightly elevated at 8.0.  We will continue to monitor this closely. -Labs done on 05/31/2019 showed CEA level 8.8. -CT AP done on 09/05/2019 showed no evidence of recurrent malignancy. -Last colonoscopy was on 01/18/2019.   2.  Hepatitis C: -Patient had a blood transfusion before 1974 was told she contracted hepatitis C. -She was treated 5 years ago for hepatitis C.   PLAN:  1.  Stage II (T2N0) hepatic flexure colon adenocarcinoma: -She does not report any change in bowel habits.  Very rare blood in the stool from hemorrhoids. -Physical exam today was within normal limits. -LFTs were normal.  CEA was 7.7.  Her CEA has always been elevated between  6 and 8. -Last CT scan was in May of this year which was within normal limits. -RTC 6 months with repeat labs and CT of the abdomen and pelvis. -We will do scans once a year.  2.  Iron deficiency state: -Hemoglobin today is 13 and MCV of 93.  3.  Hepatitis C: -LFTs are normal.  This could be contributing to elevated CEA.    Orders placed this encounter:  Orders Placed This Encounter  Procedures  . CT Abdomen Pelvis W Contrast  . CBC with Differential/Platelet  . Comprehensive metabolic panel  . CEA     Derek Jack, MD [place] [phone]   I, Milinda Antis, am acting as a scribe for Dr. Sanda Linger.  I, Derek Jack MD, have reviewed the above documentation for accuracy and completeness, and I agree with the above.

## 2020-01-22 NOTE — Patient Instructions (Addendum)
[facility] at [facility] Discharge Instructions  You were seen today by Dr. Delton Coombes. He went over your recent results. You will be scheduled for a CT scan of your abdomen before your next visit. Dr. Delton Coombes will see you back in 6 months for labs and follow up.   Thank you for choosing [place] at [facility] to provide your oncology and hematology care.  To afford each patient quality time with our provider, please arrive at least 15 minutes before your scheduled appointment time.   If you have a lab appointment with the [facility] please come in thru the Main Entrance and check in at the main information desk  You need to re-schedule your appointment should you arrive 10 or more minutes late.  We strive to give you quality time with our providers, and arriving late affects you and other patients whose appointments are after yours.  Also, if you no show three or more times for appointments you may be dismissed from the clinic at the providers discretion.     Again, thank you for choosing [facility].  Our hope is that these requests will decrease the amount of time that you wait before being seen by our physicians.       _____________________________________________________________  Should you have questions after your visit to [facility], please contact our office at [phone] between the hours of 8:00 a.m. and 4:30 p.m.  Voicemails left after 4:00 p.m. will not be returned until the following business day.  For prescription refill requests, have your pharmacy contact our office and allow 72 hours.    Cancer Center Support Programs:   > Cancer Support Group  2nd Tuesday of the month 1pm-2pm, Journey Room

## 2020-01-23 ENCOUNTER — Ambulatory Visit (HOSPITAL_COMMUNITY): Payer: Medicare Other | Admitting: Hematology

## 2020-02-03 IMAGING — CT CT ABD-PELV W/ CM
2 of 5 series · 16 of 46 positions shown, 18 images · IV contrast (omnipaque)
Comparison: 02/23/2018, 06/08/2017

CLINICAL DATA: Colon cancer, status post right colectomy

EXAM:
CT ABDOMEN AND PELVIS WITH CONTRAST
TECHNIQUE: Multidetector CT imaging of the abdomen and pelvis was performed
using the standard protocol following bolus administration of
intravenous contrast.
CONTRAST:  100mL OMNIPAQUE IOHEXOL 300 MG/ML SOLN, additional oral
enteric contrast

[Series 2: axial st · axial · 0.70mm/px · z∈[-676,-306]mm · 13 of 86 slices shown, 15 images]
[im 6/86  soft-tissue]
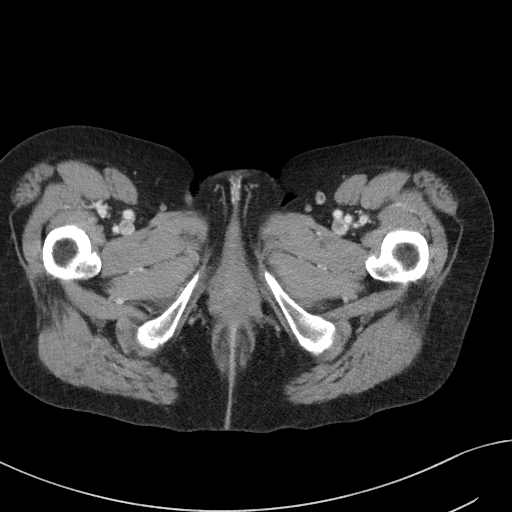
[im 6/86  bone]
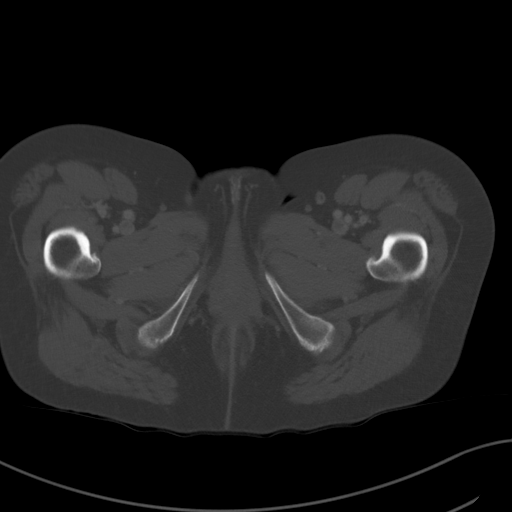
[im 12/86  soft-tissue]
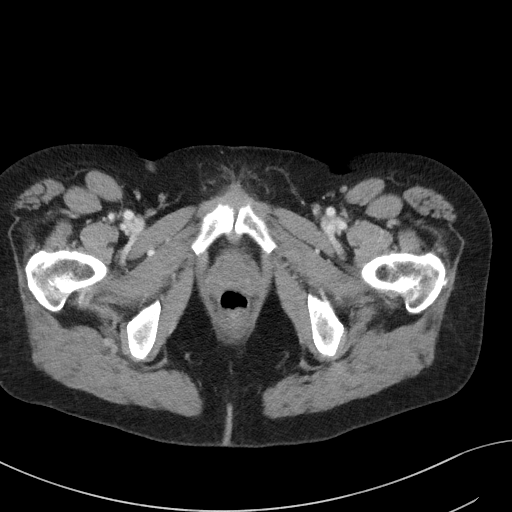
[im 18/86  soft-tissue]
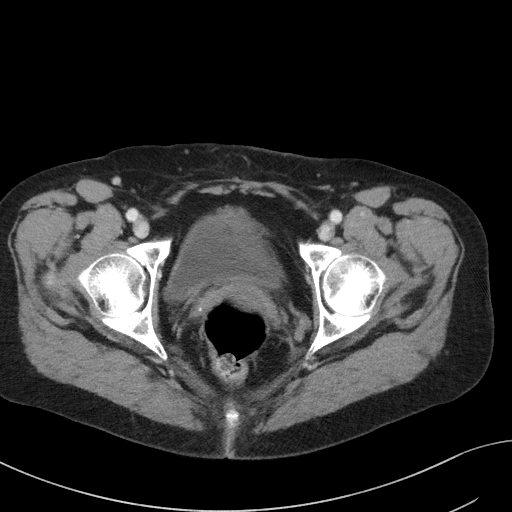
[im 23/86  soft-tissue]
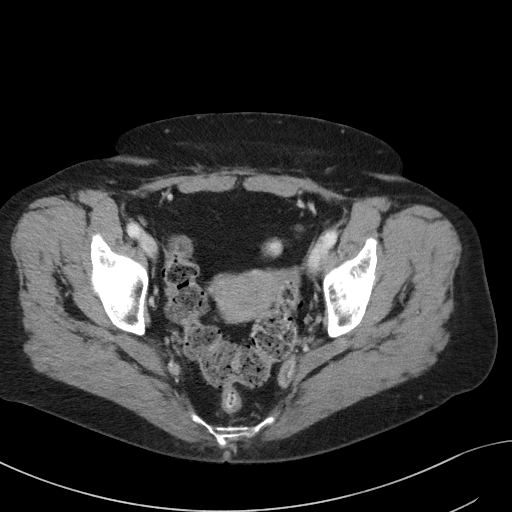
[im 29/86  soft-tissue]
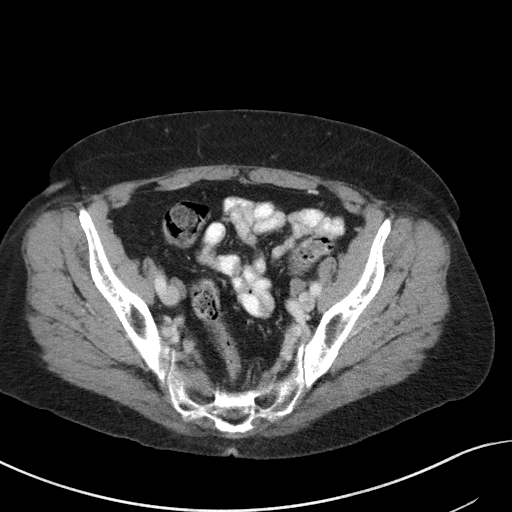
[im 35/86  soft-tissue]
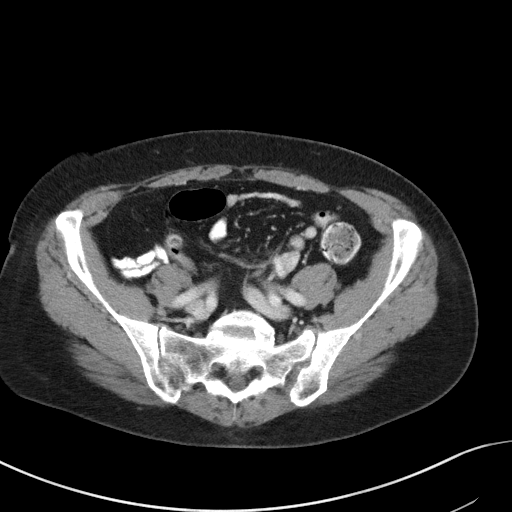
[im 46/86  soft-tissue]
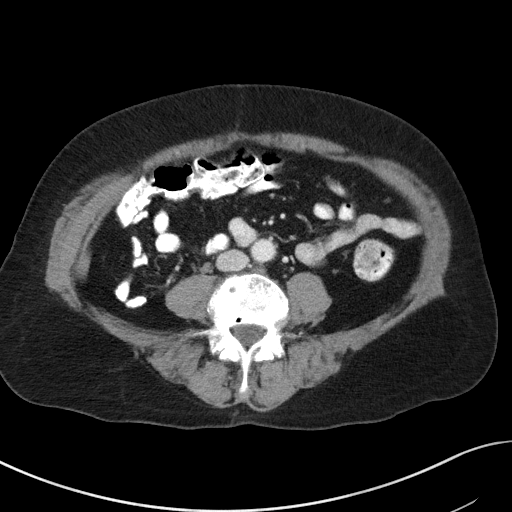
[im 52/86  soft-tissue]
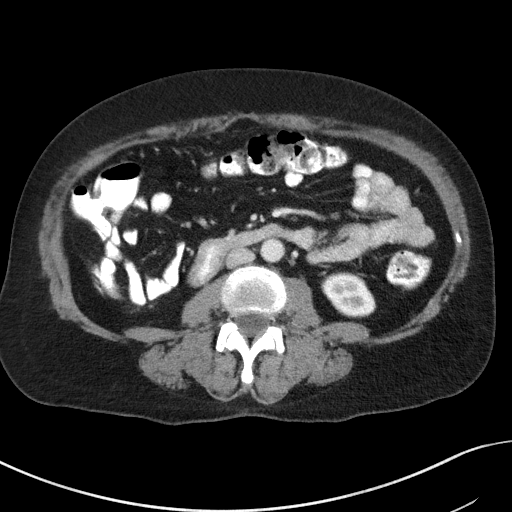
[im 57/86  soft-tissue]
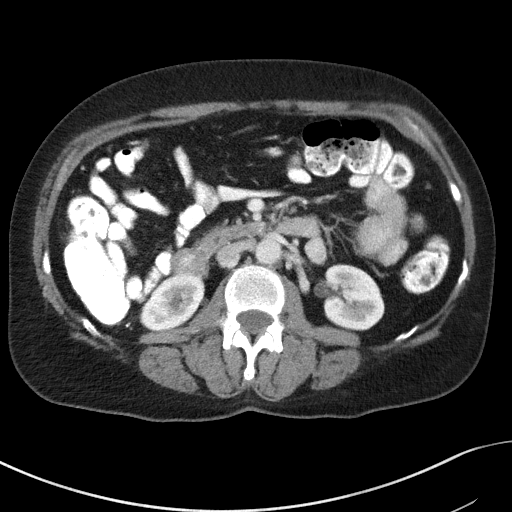
[im 57/86  bone]
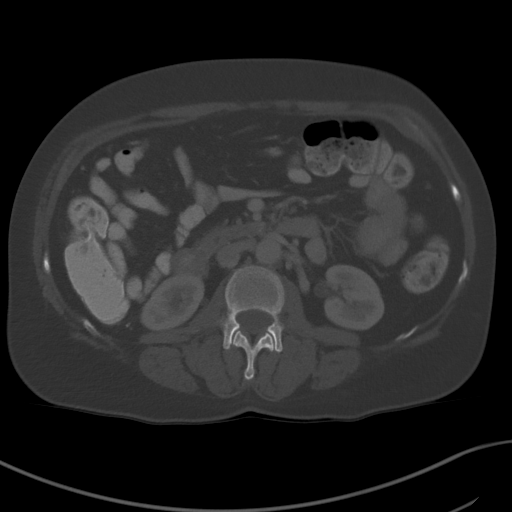
[im 63/86  soft-tissue]
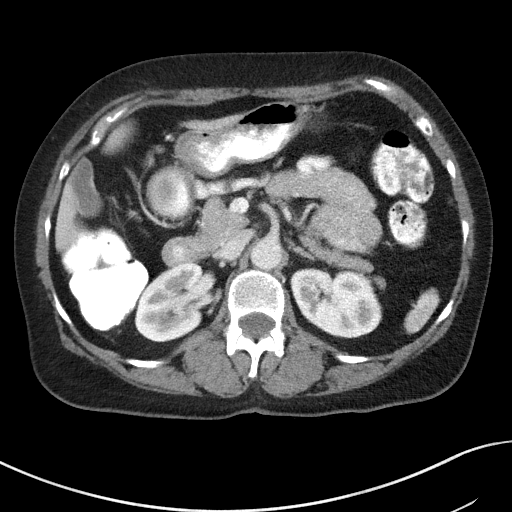
[im 69/86  soft-tissue]
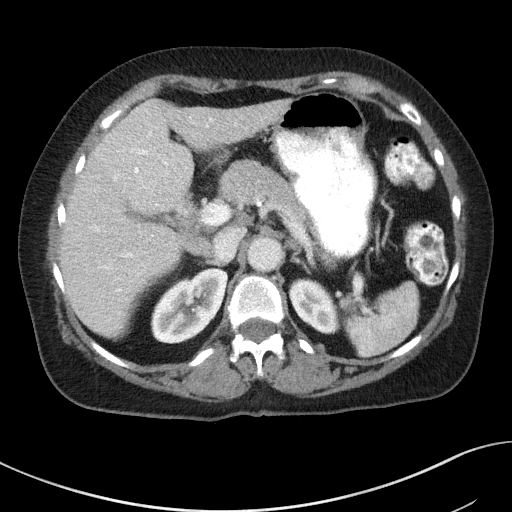
[im 74/86  soft-tissue]
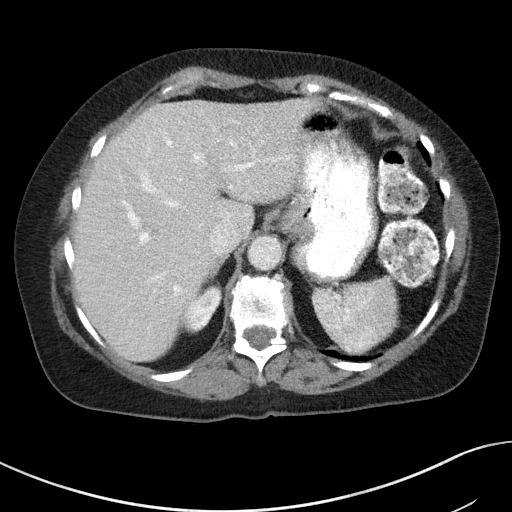
[im 80/86  soft-tissue]
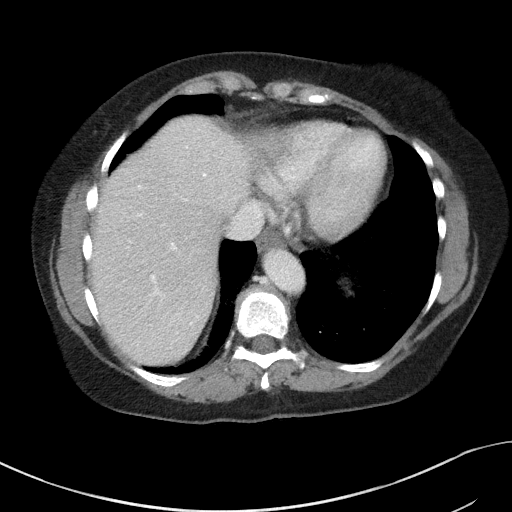

[Series 6: coronal st · coronal · 0.72mm/px · 3 of 117 slices shown]
[im 39/117  soft-tissue]
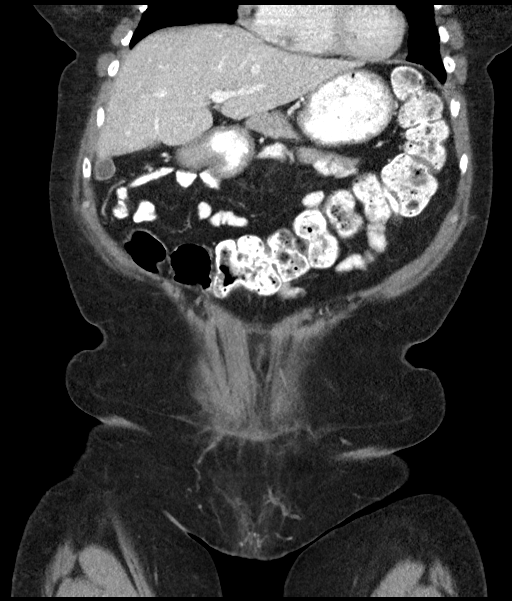
[im 52/117  soft-tissue]
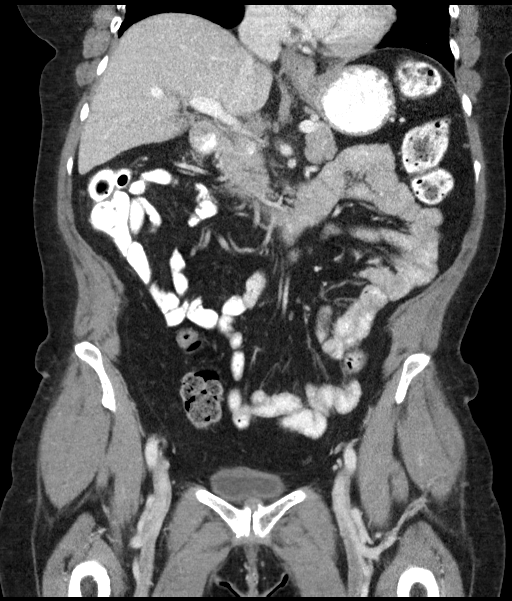
[im 65/117  soft-tissue]
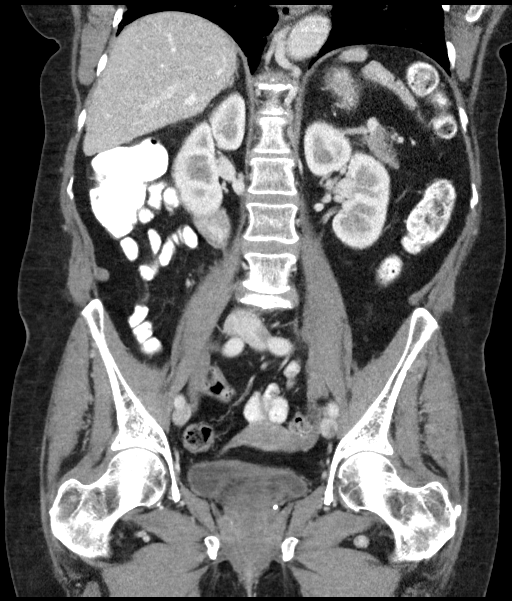

[16 of 46 positions shown; findings below may reference images not displayed]

FINDINGS: Lower chest: No acute abnormality. Bibasilar scarring and/or partial
atelectasis.

Hepatobiliary: No solid liver abnormality is seen. No gallstones,
gallbladder wall thickening, or biliary dilatation.

Pancreas: Unremarkable. No pancreatic ductal dilatation or
surrounding inflammatory changes.

Spleen: Normal in size without significant abnormality.

Adrenals/Urinary Tract: Adrenal glands are unremarkable.
Nonobstructive superior pole calculus of the right kidney. Kidneys
are otherwise normal, without renal calculi, solid lesion, or
hydronephrosis. Bladder is unremarkable.

Stomach/Bowel: Stomach is within normal limits. Redemonstrated
postoperative findings of right hemicolectomy. No evidence of bowel
wall thickening, distention, or inflammatory changes.

Vascular/Lymphatic: No significant vascular findings are present. No
enlarged abdominal or pelvic lymph nodes.

Reproductive: No mass or other significant abnormality.

Other: No abdominal wall hernia or abnormality. No abdominopelvic
ascites.

Musculoskeletal: No acute or significant osseous findings.
IMPRESSION: 1. Redemonstrated postoperative findings of right hemicolectomy. No
evidence of recurrent or metastatic disease in the abdomen or
pelvis.

2.  Nonobstructive right nephrolithiasis.

## 2020-02-07 ENCOUNTER — Ambulatory Visit: Payer: Medicare Other | Admitting: Gastroenterology

## 2020-02-12 ENCOUNTER — Other Ambulatory Visit: Payer: Self-pay

## 2020-02-12 ENCOUNTER — Telehealth: Payer: Self-pay | Admitting: *Deleted

## 2020-02-12 ENCOUNTER — Encounter: Payer: Self-pay | Admitting: Internal Medicine

## 2020-02-12 ENCOUNTER — Telehealth (INDEPENDENT_AMBULATORY_CARE_PROVIDER_SITE_OTHER): Payer: Medicare Other | Admitting: Gastroenterology

## 2020-02-12 ENCOUNTER — Encounter: Payer: Self-pay | Admitting: Gastroenterology

## 2020-02-12 DIAGNOSIS — K7460 Unspecified cirrhosis of liver: Secondary | ICD-10-CM

## 2020-02-12 DIAGNOSIS — K921 Melena: Secondary | ICD-10-CM

## 2020-02-12 DIAGNOSIS — C183 Malignant neoplasm of hepatic flexure: Secondary | ICD-10-CM | POA: Diagnosis not present

## 2020-02-12 NOTE — Telephone Encounter (Signed)
Ariana Long, you are scheduled for a virtual visit with your provider today.  Just as we do with appointments in the office, we must obtain your consent to participate.  Your consent will be active for this visit and any virtual visit you may have with one of our providers in the next 365 days.  If you have a MyChart account, I can also send a copy of this consent to you electronically.  All virtual visits are billed to your insurance company just like a traditional visit in the office.  As this is a virtual visit, video technology does not allow for your provider to perform a traditional examination.  This may limit your provider's ability to fully assess your condition.  If your provider identifies any concerns that need to be evaluated in person or the need to arrange testing such as labs, EKG, etc, we will make arrangements to do so.  Although advances in technology are sophisticated, we cannot ensure that it will always work on either your end or our end.  If the connection with a video visit is poor, we may have to switch to a telephone visit.  With either a video or telephone visit, we are not always able to ensure that we have a secure connection.   I need to obtain your verbal consent now.   Are you willing to proceed with your visit today?

## 2020-02-12 NOTE — Progress Notes (Signed)
Primary Care Physician:  Ariana Sites, MD Primary GI:  Ariana Cornea, MD   Patient Location: Home  Provider Location: [facility] office  Reason for Visit:  Chief Complaint  Patient presents with  . Cirrhosis    f/u  . Gastroesophageal Reflux    f/u. Doing fine  . Hemorrhoids    occas rectal bleeding     Persons present on the virtual encounter, with roles: Patient, myself (provider),Ariana Long CMA (updated meds and allergies)  Total time (minutes) spent on medical discussion: 15 minutes  Due to COVID-19, visit was conducted using mychart video method.  Visit was requested by patient.  Virtual Visit via mychart video   I connected with Ariana Long on 02/12/20 at  8:00 AM EDT by mychart vido and verified that I am speaking with the correct person using two identifiers.   I discussed the limitations, risks, security and privacy concerns of performing an evaluation and management service by telephone/video and the availability of in person appointments. I also discussed with the patient that there may be a patient responsible charge related to this service. The patient expressed understanding and agreed to proceed.   HPI:   Ariana Long is a 64 y.o. female who presents for virtual visit regarding cirrhosis, gerd, hemorrhoids. Patient has a history of stage II colon cancer involving the hepatic flexure diagnosed in February 2019.  She underwent right colon segmental resection.  She has had mildly elevated CEA persisting postoperatively which is being monitored no evidence of recurrent or metastatic disease.  She also has a history of hepatitis C, status post eradication.  Prior MRI in 2015 with nodular liver border, F3/F4 on FibroSure.  PET scan in April 2019 suggested shrunken appearance of the liver, nodular contour suggesting cirrhosis.  EGD in 2017 negative for esophageal varices.  Last colonoscopy September 2020, several hyperplastic polyps removed, next surveillance  in 5 years.  She was scheduled for EGD for esophageal variceal screening but requested to postpone procedure.  Previously did not tolerate hemorrhoid banding.  Patient doing well except for occasional brbpr on toilet tissue. Does not happen daily. Maybe once every 1-2 weeks. Has been occurring since before her last colonoscopy. Notes it if stool is harder. Doesn't strain. If stool is soft then no bleeding. No rectal pain. BM 2-3 per day. No abdominal pain except occasionally when bending over. Heartburn well controlled. No N/V. Appetite is good.    States the area at the gluteal cleft that we addressed last ov was lichen planus and she saw her dermatologist and was provided cream which as helped.   Patient reports that when Ariana Long did her colon surgery he said her liver looked normal. I looked at her op note from 06/2017 which confirmed her liver color and texture looked normal.   CT abdomen pelvis with contrast May 2021: Liver appeared normal.  Spleen normal.  Cholelithiasis and nonobstructing bilateral renal calculi noted.  Current Outpatient Medications  Medication Sig Dispense Refill  . Ascorbic Acid (VITAMIN C) 1000 MG tablet Take 1,000 mg by mouth daily.    . Cholecalciferol (VITAMIN D3) 50 MCG (2000 UT) TABS Take 6,000 Units by mouth daily.    . fluticasone (CUTIVATE) 0.05 % cream Apply 1 application topically 2 (two) times daily as needed (lichen planus).    . fluticasone (FLONASE) 50 MCG/ACT nasal spray Place 1 spray into both nostrils daily as needed for allergies or rhinitis.    . hydrocortisone (ANUSOL-HC) 25  MG suppository Place 1 suppository (25 mg total) rectally every 12 (twelve) hours. (Patient taking differently: Place 25 mg rectally as needed. ) 12 suppository 1  . Hydrocortisone, Perianal, (PROCTO-PAK) 1 % CREA Apply 1 application topically 2 (two) times daily as needed. Use for up to 10 days at a time 28 g 2  . Magnesium 250 MG TABS Take 1 tablet by mouth daily.    . milk  thistle 175 MG tablet Take 1,752 mg by mouth daily.    . Multiple Vitamins-Minerals (HAIR/SKIN/NAILS/BIOTIN PO) Take 1 tablet by mouth daily.    . naproxen sodium (ALEVE) 220 MG tablet Take 220 mg by mouth 2 (two) times daily as needed (pain.).    Marland Kitchen OLIVE LEAF EXTRACT PO Take 1 tablet by mouth daily.    . pantoprazole (PROTONIX) 40 MG tablet Take 40 mg by mouth daily. As needed    . Probiotic CAPS Take 1 capsule by mouth daily.     . Quercetin 250 MG TABS Take 500 mg by mouth.     Marland Kitchen VITAMIN A PO Take 1 tablet by mouth daily.     . vitamin B-12 (CYANOCOBALAMIN) 500 MCG tablet Take 500 mcg by mouth daily.    Marland Kitchen zinc gluconate 50 MG tablet Take 50 mg by mouth daily.     No current facility-administered medications for this visit.    Past Medical History:  Diagnosis Date  . Cirrhosis of liver ([place]) 01/26/2011   FIBROSIS on liver biopsy in 2007, U/S  03/31/2013= mild diffuse hepatic steatosis and /or hepatocellular disease without focal hepatic parenchymal abnormality. per Dr. Patsy Long (2012) pt is immune to hep A and Hep B. per 12/06/13 note from Liver clinic-pt had MRI which showed cirrhosis 2015.  . Diverticulitis of colon   . Elevated AFP 01/26/2011  . Gall stones   . Hematuria 12/12/2014  . Hemorrhoid 12/01/2012  . Hemorrhoids   . Hepatitis C 01/26/2011   2005 non responder, genotype 1, Gr 1-2, Stage 3 . Treated with Harvoni at the Liver clinic, responder  . Hidradenitis   . History of hiatal hernia   . History of kidney stones   . Hx: UTI (urinary tract infection)   . Kidney stones 10/2011  . Osteoporosis   . Palpitations   . Primary cancer of hepatic flexure of colon ([place]) 06/15/2017  . Serrated adenoma of colon 11/2011  . Splenomegaly 01/26/2011  . Thrombocytopenia ([place]) 01/26/2011  . Tubular adenoma   . Vulval lesion 12/14/2013   Has kissing lesions thin and puffy and paler i color about 5-6- mm in diameter.Dr Glo Long in to co examine will try temovate and recheck in 3 months    Past  Surgical History:  Procedure Laterality Date  . AXILLARY SURGERY Bilateral 1979   "glands removed"  . BREAST BIOPSY Right 2011  . BREAST EXCISIONAL BIOPSY Bilateral 25 years ago   Benign, fatty tumors  . CESAREAN SECTION     x 2  . COLECTOMY  06/15/2017   LAPAROSCOPIC ASSISTED RIGHT COLECTOMY, ERAS PATHWAY/notes 06/15/2017  . COLONOSCOPY  07/31/2005   [place]-Normal rectum/Normal colonoscopy/Repeat screening colonoscopy ten years  . COLONOSCOPY  11/16/2011   RMR: Friable anorectum-likely source of hematochezia/ LARGE 1.2x2.5CM SERRATED ADENOMA resected from ascending colon 7CM DISTAL TO ICV, hyperplastic polyp removed   . COLONOSCOPY N/A 07/18/2012   Dr. Gala Romney- normal appearing rectal mucosa. no residual polpy tissue seen at tatoo location. remainder of colonic mucosa appeared entirely normal. bx = tubular adenoma  .  COLONOSCOPY N/A 08/29/2015   Dr. Gala Romney: single 12 mm polyp in ascending colon, 4 mm polyp ascending. Sessile serrated adenoma. 3 year surveillance  . COLONOSCOPY N/A 05/19/2017   4X2 cm sessile lesion at hepatic flexdure, unable to be completely removed  . COLONOSCOPY N/A 01/18/2019   Dr. Gala Romney: Several small polyps removed, hyperplastic.  Next colonoscopy in 5 years.  . ESOPHAGEAL BANDING N/A 08/29/2015   Procedure: ESOPHAGEAL BANDING;  Surgeon: Daneil Dolin, MD;  [facility];  Service: Endoscopy;  Laterality: N/A;  . ESOPHAGOGASTRODUODENOSCOPY N/A 08/29/2015   Dr. Gala Romney: non-critical Schatzki's ring, somewhat prominent small submucosal vessels without obvious varices. hyperplastic gastric polyp. 2 year surveillance  . HEMORRHOID BANDING    . LAPAROSCOPIC RIGHT COLECTOMY N/A 06/15/2017   Procedure: LAPAROSCOPIC ASSISTED RIGHT COLECTOMY, ERAS PATHWAY;  Surgeon: Fanny Skates, MD;  Location: [place];  Service: General;  Laterality: N/A;  . POLYPECTOMY  05/19/2017   Procedure: POLYPECTOMY;  Surgeon: Daneil Dolin, MD;  [facility]: [facility];  Service: Endoscopy;;  .  POLYPECTOMY  01/18/2019   Procedure: POLYPECTOMY;  Surgeon: Daneil Dolin, MD;  Location: [facility];  Service: Endoscopy;;  . SKIN LESION EXCISION      Family History  Problem Relation Age of Onset  . Heart attack Mother   . Stroke Mother   . Aneurysm Father   . Colon cancer Neg Hx     Social History   Socioeconomic History  . Marital status: Married    Spouse name: Not on file  . Number of children: 2  . Years of education: Not on file  . Highest education level: Not on file  Occupational History  . Occupation: Occupational psychologist: [employer]  Tobacco Use  . Smoking status: Never Smoker  . Smokeless tobacco: Never Used  Vaping Use  . Vaping Use: Never used  Substance and Sexual Activity  . Alcohol use: No  . Drug use: No  . Sexual activity: Yes    Birth control/protection: Post-menopausal  Other Topics Concern  . Not on file  Social History Narrative  . Not on file   Social Determinants of Health   Financial Resource Strain:   . Difficulty of Paying Living Expenses: Not on file  Food Insecurity:   . Worried About Charity fundraiser in the Last Year: Not on file  . Ran Out of Food in the Last Year: Not on file  Transportation Needs:   . Lack of Transportation (Medical): Not on file  . Lack of Transportation (Non-Medical): Not on file  Physical Activity:   . Days of Exercise per Week: Not on file  . Minutes of Exercise per Session: Not on file  Stress:   . Feeling of Stress : Not on file  Social Connections:   . Frequency of Communication with Friends and Family: Not on file  . Frequency of Social Gatherings with Friends and Family: Not on file  . Attends Religious Services: Not on file  . Active Member of Clubs or Organizations: Not on file  . Attends Archivist Meetings: Not on file  . Marital Status: Not on file  Intimate Partner Violence:   . Fear of Current or Ex-Partner: Not on file  . Emotionally Abused: Not on file  .  Physically Abused: Not on file  . Sexually Abused: Not on file      ROS:  General: Negative for anorexia, weight loss, fever, chills, fatigue, weakness. Eyes:  Negative for vision changes.  ENT: Negative for hoarseness, difficulty swallowing , nasal congestion. CV: Negative for chest pain, angina, palpitations, dyspnea on exertion, peripheral edema.  Respiratory: Negative for dyspnea at rest, dyspnea on exertion, cough, sputum, wheezing.  GI: See history of present illness. GU:  Negative for dysuria, hematuria, urinary incontinence, urinary frequency, nocturnal urination.  MS: Negative for joint pain, low back pain.  Derm: lichen planus  Neuro: Negative for weakness, abnormal sensation, seizure, frequent headaches, memory loss, confusion.  Psych: Negative for anxiety, depression, suicidal ideation, hallucinations.  Endo: Negative for unusual weight change.  Heme: Negative for bruising or bleeding. Allergy: Negative for rash or hives.   Observations/Objective:  Pleasant well-nourished well-developed female in no acute distress.  Breathing comfortably.  She is alert and appropriate.  Lab Results  Component Value Date   CREATININE 0.76 01/15/2020   BUN 17 01/15/2020   NA 142 01/15/2020   K 3.7 01/15/2020   CL 108 01/15/2020   CO2 25 01/15/2020   Lab Results  Component Value Date   ALT 28 01/15/2020   AST 32 01/15/2020   ALKPHOS 66 01/15/2020   BILITOT 0.7 01/15/2020   Lab Results  Component Value Date   WBC 4.0 01/15/2020   HGB 13.0 01/15/2020   HCT 39.7 01/15/2020   MCV 93.4 01/15/2020   PLT 163 01/15/2020   Lab Results  Component Value Date   INR 1.0 09/05/2019   INR 1.0 01/11/2019   INR 1.02 06/14/2017   No results found for: CEA  Assessment and Plan: Pleasant 64 year old female with history of colon cancer, history of hepatitis C status post eradication, previous evidence suspicious for cirrhosis as outlined above who presents for follow-up and concerns for  rectal bleeding.  She is concerned about ongoing intermittent bright red blood per rectum, predominantly on the tissue.  Suspected to be hemorrhoid bleeding.  Previously did not tolerate hemorrhoid banding.  Colonoscopy currently up-to-date, September 2020.  Discussed alarm symptoms including increased frequency of bleeding, increased amount of bleeding, change in bowel habits, weight loss, blood in the stool.  Alarm symptoms with warrant further investigation.  I will discuss further with Dr. Gala Romney and see if he advises any further evaluation at this time.  Would recommend continued topical hemorrhoid agents as needed.  History of hepatitis C status post eradication.  Previous imaging via MRI in 2015 and PET scan April 2019 concern for nodular liver/suggesting cirrhosis.  EGD in 2017 with no evidence of portal hypertension gastropathy or esophageal varices.  She has delayed subsequent EGD for esophageal variceal screening.  She has now had several CTs with contrast in light of her colon cancer diagnosis, liver appears to be looking normal at this time, no splenomegaly or other findings suggestive of advanced liver disease.  No longer thrombocytopenia.  We will continue to monitor closely.  She is due for repeat imaging and labs in March 2022 under the direction of Dr. Delton Coombes.  Obtain INR at the same time.  Return to the office in 6 months.   Follow Up Instructions:    I discussed the assessment and treatment plan with the patient. The patient was provided an opportunity to ask questions and all were answered. The patient agreed with the plan and demonstrated an understanding of the instructions. AVS mailed to patient's home address.   The patient was advised to call back or seek an in-person evaluation if the symptoms worsen or if the condition fails to improve as anticipated.  I provided  15 minutes of virtual face-to-face time during this encounter.   Isamar Wellbrock, PA-C  

## 2020-02-12 NOTE — Patient Instructions (Signed)
1. I suspect your bleeding is from hemorrhoids.  I will discuss further with Dr. Gala Romney. I don't anticipate need for colonoscopy given your last one was one year ago. If you notice increased frequency of bleeding, increased amount of bleeding, blood in the stool or any change in bowel habits, you should let us know as these would be considered alarm symptoms and would need to be further investigated. 2. I will follow up labs and CT scheduled for 07/2020. 3. Please have our one blood test done at time of her labs in 07/2020 with Dr. Delton Coombes.  4. Return to the office in six months.

## 2020-02-12 NOTE — Progress Notes (Signed)
Cc'ed to pcp °

## 2020-02-12 NOTE — Telephone Encounter (Signed)
Pt consented to a virtual visit today.   

## 2020-02-19 ENCOUNTER — Other Ambulatory Visit: Payer: Medicare Other | Admitting: Adult Health

## 2020-02-21 ENCOUNTER — Telehealth: Payer: Self-pay | Admitting: Gastroenterology

## 2020-02-21 NOTE — Telephone Encounter (Signed)
Patient recently seen in the office. Complaints of ongoing intermittent rectal bleeding, prior h/o colon cancer.   Please let pt know that Dr. Gala Romney said he would offer flex sig to evaluate ongoing rectal bleeding if she desires.   If agreeable, schedule flex sig with conscious sedation. ASA II.

## 2020-02-21 NOTE — Telephone Encounter (Signed)
Spoke with pt. Pt was notified by the recommendations of Flex Sig per Dr. Gala Romney. Pt wants to think about this and will contact our office back when she is read to schedule.

## 2020-03-18 ENCOUNTER — Other Ambulatory Visit: Payer: Medicare Other | Admitting: Adult Health

## 2020-03-22 ENCOUNTER — Other Ambulatory Visit (HOSPITAL_COMMUNITY): Payer: Self-pay | Admitting: Family Medicine

## 2020-03-22 DIAGNOSIS — Z1231 Encounter for screening mammogram for malignant neoplasm of breast: Secondary | ICD-10-CM

## 2020-04-05 ENCOUNTER — Encounter: Payer: Self-pay | Admitting: Adult Health

## 2020-04-05 ENCOUNTER — Other Ambulatory Visit (HOSPITAL_COMMUNITY)
Admission: RE | Admit: 2020-04-05 | Discharge: 2020-04-05 | Disposition: A | Discharge status: Home / Self Care | Payer: Medicare Other | Source: Ambulatory Visit | Attending: Adult Health | Admitting: Adult Health

## 2020-04-05 ENCOUNTER — Ambulatory Visit (INDEPENDENT_AMBULATORY_CARE_PROVIDER_SITE_OTHER): Payer: Medicare Other | Admitting: Adult Health

## 2020-04-05 ENCOUNTER — Other Ambulatory Visit: Payer: Self-pay

## 2020-04-05 VITALS — BP 122/74 | HR 64 | Ht 65.0 in | Wt 152.0 lb

## 2020-04-05 DIAGNOSIS — L439 Lichen planus, unspecified: Secondary | ICD-10-CM | POA: Diagnosis not present

## 2020-04-05 DIAGNOSIS — Z01419 Encounter for gynecological examination (general) (routine) without abnormal findings: Secondary | ICD-10-CM | POA: Insufficient documentation

## 2020-04-05 DIAGNOSIS — Z124 Encounter for screening for malignant neoplasm of cervix: Secondary | ICD-10-CM | POA: Insufficient documentation

## 2020-04-05 DIAGNOSIS — Z1151 Encounter for screening for human papillomavirus (HPV): Secondary | ICD-10-CM | POA: Diagnosis not present

## 2020-04-05 DIAGNOSIS — Z1211 Encounter for screening for malignant neoplasm of colon: Secondary | ICD-10-CM | POA: Diagnosis not present

## 2020-04-05 DIAGNOSIS — L280 Lichen simplex chronicus: Secondary | ICD-10-CM

## 2020-04-05 LAB — HEMOCCULT GUIAC POC 1CARD (OFFICE): Fecal Occult Blood, POC: NEGATIVE

## 2020-04-05 NOTE — Progress Notes (Signed)
Patient ID: Ariana Long, female   DOB: 03-11-1956, 64 y.o.   MRN: [id number] History of Present Illness: Ariana Long is a 64 year old black female, married in for well woman gyn exam and pap. PCP is Dr Hilma Favors   Current Medications, Allergies, Past Medical History, Past Surgical History, Family History and Social History were reviewed in [place] record.     Review of Systems: Patient denies any headaches, hearing loss, fatigue, blurred vision, shortness of breath, chest pain, abdominal pain, problems with bowel movements, urination, or intercourse. No joint pain or mood swings.    Physical Exam:BP 122/74 (BP Location: Left Arm, Patient Position: Sitting, Cuff Size: Normal)   Pulse 64   Ht 5\' 5"  (1.651 m)   Wt 152 lb (68.9 kg)   BMI 25.29 kg/m  General:  Well developed, well nourished, no acute distress Skin:  Warm and dry Neck:  Midline trachea, normal thyroid, good ROM, no lymphadenopathy,no carotid bruits heard  Lungs; Clear to auscultation bilaterally Breast:  No dominant palpable mass, retraction, or nipple discharge Cardiovascular: Regular rate and rhythm Abdomen:  Soft, non tender, no hepatosplenomegaly Pelvic:  External genitalia is has dry thickened skin on vulva to rectal area.9see says dermatologist said was lichen planus)  The vagina is pale with loss of moisture. Urethra has no lesions or masses. The cervix is smooth, pap with high risk HPV genotyping performed.  Uterus is felt to be normal size, shape, and contour.  No adnexal masses or tenderness noted.Bladder is non tender, no masses felt. Rectal: Good sphincter tone, no polyps, or hemorrhoids felt.  Hemoccult negative. Extremities/musculoskeletal:  No swelling or varicosities noted, no clubbing or cyanosis Psych:  No mood changes, alert and cooperative,seems happy AA is 0  Fall risk is low PHQ 9 score is 0   Upstream - 04/05/20 1125      Pregnancy Intention Screening   Does the patient want  to become pregnant in the next year? N/A    Does the patient's partner want to become pregnant in the next year? N/A    Would the patient like to discuss contraceptive options today? N/A      Contraception Wrap Up   Current Method No Method - Other Reason   postmenopausal   End Method No Method - Other Reason   postmenopausal   Contraception Counseling Provided No         Examination chaperoned by Levy Pupa LPN  Impression and Plan: 1. Routine cervical smear Pap sent  2. Encounter for gynecological examination with Papanicolaou smear of cervix Pap sent Physical in 2 years  Pap in 3 years if normal,can stop at 68 if desires  3. Encounter for screening fecal occult blood testing   4. Lichenification and lichen simplex chronicus   5. Lichen planus Has cream from dermatologist  I did tell her this is chronic

## 2020-04-10 LAB — CYTOLOGY - PAP
Chlamydia: NEGATIVE
Comment: NEGATIVE
Comment: NEGATIVE
Comment: NORMAL
Diagnosis: NEGATIVE
High risk HPV: NEGATIVE
Neisseria Gonorrhea: NEGATIVE

## 2020-04-11 ENCOUNTER — Telehealth: Payer: Self-pay | Admitting: Adult Health

## 2020-04-11 NOTE — Telephone Encounter (Signed)
Patient would like a returned phone call about results. Clinical staff will follow up with patient.

## 2020-04-11 NOTE — Telephone Encounter (Signed)
Called patient back and informed her that pap smear was normal. No other questions at this time.

## 2020-04-15 ENCOUNTER — Other Ambulatory Visit: Payer: Self-pay

## 2020-04-15 ENCOUNTER — Encounter (INDEPENDENT_AMBULATORY_CARE_PROVIDER_SITE_OTHER): Payer: Medicare Other | Admitting: Ophthalmology

## 2020-04-15 DIAGNOSIS — H43813 Vitreous degeneration, bilateral: Secondary | ICD-10-CM | POA: Diagnosis not present

## 2020-04-15 DIAGNOSIS — H33303 Unspecified retinal break, bilateral: Secondary | ICD-10-CM | POA: Diagnosis not present

## 2020-04-15 DIAGNOSIS — H2513 Age-related nuclear cataract, bilateral: Secondary | ICD-10-CM | POA: Diagnosis not present

## 2020-04-22 DIAGNOSIS — D509 Iron deficiency anemia, unspecified: Secondary | ICD-10-CM | POA: Diagnosis not present

## 2020-04-22 DIAGNOSIS — Z6824 Body mass index (BMI) 24.0-24.9, adult: Secondary | ICD-10-CM | POA: Diagnosis not present

## 2020-04-22 DIAGNOSIS — Z1389 Encounter for screening for other disorder: Secondary | ICD-10-CM | POA: Diagnosis not present

## 2020-04-22 DIAGNOSIS — E559 Vitamin D deficiency, unspecified: Secondary | ICD-10-CM | POA: Diagnosis not present

## 2020-04-22 DIAGNOSIS — Z0000 Encounter for general adult medical examination without abnormal findings: Secondary | ICD-10-CM | POA: Diagnosis not present

## 2020-04-22 DIAGNOSIS — R7309 Other abnormal glucose: Secondary | ICD-10-CM | POA: Diagnosis not present

## 2020-04-22 DIAGNOSIS — E7849 Other hyperlipidemia: Secondary | ICD-10-CM | POA: Diagnosis not present

## 2020-04-22 DIAGNOSIS — D696 Thrombocytopenia, unspecified: Secondary | ICD-10-CM | POA: Diagnosis not present

## 2020-04-22 DIAGNOSIS — B182 Chronic viral hepatitis C: Secondary | ICD-10-CM | POA: Diagnosis not present

## 2020-04-29 ENCOUNTER — Other Ambulatory Visit: Payer: Self-pay

## 2020-04-29 ENCOUNTER — Ambulatory Visit (HOSPITAL_COMMUNITY)
Admission: RE | Admit: 2020-04-29 | Discharge: 2020-04-29 | Disposition: A | Discharge status: Home / Self Care | Payer: Medicare Other | Source: Ambulatory Visit | Attending: Family Medicine | Admitting: Family Medicine

## 2020-04-29 DIAGNOSIS — Z1231 Encounter for screening mammogram for malignant neoplasm of breast: Secondary | ICD-10-CM | POA: Diagnosis not present

## 2020-05-24 DIAGNOSIS — K7460 Unspecified cirrhosis of liver: Secondary | ICD-10-CM | POA: Diagnosis not present

## 2020-05-24 DIAGNOSIS — U071 COVID-19: Secondary | ICD-10-CM | POA: Diagnosis not present

## 2020-05-28 DIAGNOSIS — U071 COVID-19: Secondary | ICD-10-CM | POA: Diagnosis not present

## 2020-06-21 DIAGNOSIS — L851 Acquired keratosis [keratoderma] palmaris et plantaris: Secondary | ICD-10-CM | POA: Diagnosis not present

## 2020-06-21 DIAGNOSIS — B351 Tinea unguium: Secondary | ICD-10-CM | POA: Diagnosis not present

## 2020-07-09 ENCOUNTER — Ambulatory Visit (HOSPITAL_COMMUNITY)
Admission: RE | Admit: 2020-07-09 | Discharge: 2020-07-09 | Disposition: A | Discharge status: Home / Self Care | Payer: Medicare Other | Source: Ambulatory Visit | Attending: Family Medicine | Admitting: Family Medicine

## 2020-07-09 ENCOUNTER — Other Ambulatory Visit: Payer: Self-pay

## 2020-07-09 ENCOUNTER — Other Ambulatory Visit (HOSPITAL_COMMUNITY): Payer: Self-pay | Admitting: Family Medicine

## 2020-07-09 DIAGNOSIS — Z6825 Body mass index (BMI) 25.0-25.9, adult: Secondary | ICD-10-CM | POA: Diagnosis not present

## 2020-07-09 DIAGNOSIS — R079 Chest pain, unspecified: Secondary | ICD-10-CM | POA: Diagnosis not present

## 2020-07-09 DIAGNOSIS — Z8616 Personal history of COVID-19: Secondary | ICD-10-CM | POA: Diagnosis not present

## 2020-07-09 DIAGNOSIS — Z1389 Encounter for screening for other disorder: Secondary | ICD-10-CM

## 2020-07-09 DIAGNOSIS — R0781 Pleurodynia: Secondary | ICD-10-CM | POA: Diagnosis not present

## 2020-07-09 DIAGNOSIS — M25519 Pain in unspecified shoulder: Secondary | ICD-10-CM | POA: Insufficient documentation

## 2020-07-09 DIAGNOSIS — Z1331 Encounter for screening for depression: Secondary | ICD-10-CM | POA: Diagnosis not present

## 2020-07-17 ENCOUNTER — Other Ambulatory Visit (HOSPITAL_COMMUNITY)
Admission: RE | Admit: 2020-07-17 | Discharge: 2020-07-17 | Disposition: A | Discharge status: Home / Self Care | Payer: Medicare Other | Source: Ambulatory Visit | Attending: Gastroenterology | Admitting: Gastroenterology

## 2020-07-17 ENCOUNTER — Other Ambulatory Visit: Payer: Self-pay

## 2020-07-17 ENCOUNTER — Inpatient Hospital Stay (HOSPITAL_COMMUNITY): Payer: Medicare Other | Attending: Hematology

## 2020-07-17 ENCOUNTER — Ambulatory Visit (HOSPITAL_COMMUNITY)
Admission: RE | Admit: 2020-07-17 | Discharge: 2020-07-17 | Disposition: A | Discharge status: Home / Self Care | Payer: Medicare Other | Source: Ambulatory Visit | Attending: Hematology | Admitting: Hematology

## 2020-07-17 DIAGNOSIS — Z9049 Acquired absence of other specified parts of digestive tract: Secondary | ICD-10-CM | POA: Diagnosis not present

## 2020-07-17 DIAGNOSIS — Z79899 Other long term (current) drug therapy: Secondary | ICD-10-CM | POA: Diagnosis not present

## 2020-07-17 DIAGNOSIS — C183 Malignant neoplasm of hepatic flexure: Secondary | ICD-10-CM | POA: Insufficient documentation

## 2020-07-17 DIAGNOSIS — Z85038 Personal history of other malignant neoplasm of large intestine: Secondary | ICD-10-CM | POA: Diagnosis not present

## 2020-07-17 DIAGNOSIS — B1920 Unspecified viral hepatitis C without hepatic coma: Secondary | ICD-10-CM | POA: Insufficient documentation

## 2020-07-17 DIAGNOSIS — C189 Malignant neoplasm of colon, unspecified: Secondary | ICD-10-CM | POA: Diagnosis not present

## 2020-07-17 DIAGNOSIS — M47816 Spondylosis without myelopathy or radiculopathy, lumbar region: Secondary | ICD-10-CM | POA: Diagnosis not present

## 2020-07-17 DIAGNOSIS — K8020 Calculus of gallbladder without cholecystitis without obstruction: Secondary | ICD-10-CM | POA: Diagnosis not present

## 2020-07-17 DIAGNOSIS — N200 Calculus of kidney: Secondary | ICD-10-CM | POA: Insufficient documentation

## 2020-07-17 DIAGNOSIS — E611 Iron deficiency: Secondary | ICD-10-CM | POA: Insufficient documentation

## 2020-07-17 DIAGNOSIS — K7460 Unspecified cirrhosis of liver: Secondary | ICD-10-CM | POA: Insufficient documentation

## 2020-07-17 LAB — CBC WITH DIFFERENTIAL/PLATELET
Abs Immature Granulocytes: 0.02 10*3/uL (ref 0.00–0.07)
Basophils Absolute: 0.1 10*3/uL (ref 0.0–0.1)
Basophils Relative: 1 %
Eosinophils Absolute: 0.1 10*3/uL (ref 0.0–0.5)
Eosinophils Relative: 3 %
HCT: 41.6 % (ref 36.0–46.0)
Hemoglobin: 13.5 g/dL (ref 12.0–15.0)
Immature Granulocytes: 1 %
Lymphocytes Relative: 39 %
Lymphs Abs: 1.7 10*3/uL (ref 0.7–4.0)
MCH: 30.5 pg (ref 26.0–34.0)
MCHC: 32.5 g/dL (ref 30.0–36.0)
MCV: 93.9 fL (ref 80.0–100.0)
Monocytes Absolute: 0.5 10*3/uL (ref 0.1–1.0)
Monocytes Relative: 11 %
Neutro Abs: 1.9 10*3/uL (ref 1.7–7.7)
Neutrophils Relative %: 45 %
Platelets: 131 10*3/uL — ABNORMAL LOW (ref 150–400)
RBC: 4.43 MIL/uL (ref 3.87–5.11)
RDW: 13.8 % (ref 11.5–15.5)
WBC: 4.2 10*3/uL (ref 4.0–10.5)
nRBC: 0 % (ref 0.0–0.2)

## 2020-07-17 LAB — COMPREHENSIVE METABOLIC PANEL
ALT: 26 U/L (ref 0–44)
AST: 25 U/L (ref 15–41)
Albumin: 4.1 g/dL (ref 3.5–5.0)
Alkaline Phosphatase: 80 U/L (ref 38–126)
Anion gap: 10 (ref 5–15)
BUN: 14 mg/dL (ref 8–23)
CO2: 26 mmol/L (ref 22–32)
Calcium: 9.5 mg/dL (ref 8.9–10.3)
Chloride: 105 mmol/L (ref 98–111)
Creatinine, Ser: 0.75 mg/dL (ref 0.44–1.00)
GFR, Estimated: >60 mL/min (ref >60)
Glucose, Bld: 109 mg/dL — ABNORMAL HIGH (ref 70–99)
Potassium: 3.9 mmol/L (ref 3.5–5.1)
Sodium: 141 mmol/L (ref 135–145)
Total Bilirubin: 0.7 mg/dL (ref 0.3–1.2)
Total Protein: 7.8 g/dL (ref 6.5–8.1)

## 2020-07-17 LAB — PROTIME-INR
INR: 1 (ref 0.8–1.2)
Prothrombin Time: 12.6 seconds (ref 11.4–15.2)

## 2020-07-17 MED ORDER — IOHEXOL 300 MG/ML  SOLN
100.0000 mL | Freq: Once | INTRAMUSCULAR | Status: AC | PRN
  Administered 2020-07-17: 100 mL via INTRAVENOUS

## 2020-07-18 LAB — CEA: CEA: 7.1 ng/mL — ABNORMAL HIGH (ref 0.0–4.7)

## 2020-07-22 ENCOUNTER — Other Ambulatory Visit: Payer: Self-pay

## 2020-07-22 ENCOUNTER — Encounter (HOSPITAL_COMMUNITY): Payer: Self-pay | Admitting: Hematology

## 2020-07-22 ENCOUNTER — Inpatient Hospital Stay (HOSPITAL_BASED_OUTPATIENT_CLINIC_OR_DEPARTMENT_OTHER): Payer: Medicare Other | Admitting: Hematology

## 2020-07-22 VITALS — BP 122/76 | HR 63 | Temp 96.8°F | Resp 18 | Wt 153.0 lb

## 2020-07-22 DIAGNOSIS — Z79899 Other long term (current) drug therapy: Secondary | ICD-10-CM | POA: Diagnosis not present

## 2020-07-22 DIAGNOSIS — B1920 Unspecified viral hepatitis C without hepatic coma: Secondary | ICD-10-CM | POA: Diagnosis not present

## 2020-07-22 DIAGNOSIS — C183 Malignant neoplasm of hepatic flexure: Secondary | ICD-10-CM

## 2020-07-22 DIAGNOSIS — Z9049 Acquired absence of other specified parts of digestive tract: Secondary | ICD-10-CM | POA: Diagnosis not present

## 2020-07-22 DIAGNOSIS — Z85038 Personal history of other malignant neoplasm of large intestine: Secondary | ICD-10-CM | POA: Diagnosis not present

## 2020-07-22 DIAGNOSIS — E611 Iron deficiency: Secondary | ICD-10-CM | POA: Diagnosis not present

## 2020-07-22 NOTE — Progress Notes (Signed)
[place], [place] [address]   CLINIC:  Medical Oncology/Hematology  PCP:  Sharilyn Sites, [place] / [place] [address] [phone]   REASON FOR VISIT:  Follow-up for stage II colon cancer  PRIOR THERAPY: Right colon segmental resection on 06/15/2017  NGS Results: Not done  CURRENT THERAPY: Surveillance  BRIEF ONCOLOGIC HISTORY:  Oncology History  Primary cancer of hepatic flexure of colon ([place])  06/15/2017 Initial Diagnosis   Primary cancer of hepatic flexure of colon ([place])   06/15/2017 Cancer Staging   Staging form: Colon and Rectum - Neuroendocine Tumors, AJCC 8th Edition - Clinical stage from 06/15/2017: Stage IIA (cT2, cN0, cM0) - Signed by Derek Jack, MD on 08/18/2017     CANCER STAGING: Cancer Staging Primary cancer of hepatic flexure of colon [facility]) Staging form: Colon and Rectum - Neuroendocine Tumors, AJCC 8th Edition - Clinical stage from 06/15/2017: Stage IIA (cT2, cN0, cM0) - Signed by Derek Jack, MD on 08/18/2017   INTERVAL HISTORY:  Ariana Long, a 65 y.o. female, returns for routine follow-up of her stage II colon cancer. Ariana Long was last seen on 01/22/2020.   Today she reports feeling well. She denies having any regurgitation, abdominal pain or cramping, diarrhea, constipation or hematochezia and denies issues with eating fatty foods. She denies having any infections in the past 6 months.  She has never smoked.   REVIEW OF SYSTEMS:  Review of Systems  Constitutional: Negative for appetite change and fatigue.  Cardiovascular: Positive for palpitations.  Gastrointestinal: Negative for abdominal pain, blood in stool, constipation and diarrhea.  All other systems reviewed and are negative.   PAST MEDICAL/SURGICAL HISTORY:  Past Medical History:  Diagnosis Date   Cirrhosis of liver ([place]) 01/26/2011   FIBROSIS on liver biopsy in 2007, U/S  03/31/2013= mild diffuse hepatic  steatosis and /or hepatocellular disease without focal hepatic parenchymal abnormality. per Dr. Patsy [place] (2012) pt is immune to hep A and Hep B. per 12/06/13 note from Liver clinic-pt had MRI which showed cirrhosis 2015.   Diverticulitis of colon    Elevated AFP 01/26/2011   Gall stones    Hematuria 12/12/2014   Hemorrhoid 12/01/2012   Hemorrhoids    Hepatitis C 01/26/2011   2005 non responder, genotype 1, Gr 1-2, Stage 3 . Treated with Harvoni at the Liver clinic, responder   Hidradenitis    History of hiatal hernia    History of kidney stones    Hx: UTI (urinary tract infection)    Kidney stones 10/2011   Osteoporosis    Palpitations    Primary cancer of hepatic flexure of colon ([place]) 06/15/2017   Serrated adenoma of colon 11/2011   Splenomegaly 01/26/2011   Thrombocytopenia ([place]) 01/26/2011   Tubular adenoma    Vulval lesion 12/14/2013   Has kissing lesions thin and puffy and paler i color about 5-6- mm in diameter.Dr Glo Herring in to co examine will try temovate and recheck in 3 months   Past Surgical History:  Procedure Laterality Date   AXILLARY SURGERY Bilateral 1979   "glands removed"   BREAST BIOPSY Right 2011   BREAST EXCISIONAL BIOPSY Bilateral 25 years ago   Benign, fatty tumors   CESAREAN SECTION     x 2   COLECTOMY  06/15/2017   LAPAROSCOPIC ASSISTED RIGHT COLECTOMY, ERAS PATHWAY/notes 06/15/2017   COLONOSCOPY  07/31/2005   [facility]-Normal rectum/Normal colonoscopy/Repeat screening colonoscopy ten years   COLONOSCOPY  11/16/2011   RMR: [facility]  anorectum-likely source of hematochezia/ LARGE 1.2x2.5CM SERRATED ADENOMA resected from ascending colon 7CM DISTAL TO ICV, hyperplastic polyp removed    COLONOSCOPY N/A 07/18/2012   Dr. Gala Romney- normal appearing rectal mucosa. no residual polpy tissue seen at tatoo location. remainder of colonic mucosa appeared entirely normal. bx = tubular adenoma   COLONOSCOPY N/A 08/29/2015   Dr. Gala Romney: single 12 mm polyp in  ascending colon, 4 mm polyp ascending. Sessile serrated adenoma. 3 year surveillance   COLONOSCOPY N/A 05/19/2017   4X2 cm sessile lesion at hepatic flexdure, unable to be completely removed   COLONOSCOPY N/A 01/18/2019   Dr. Gala Romney: Several small polyps removed, hyperplastic.  Next colonoscopy in 5 years.   ESOPHAGEAL BANDING N/A 08/29/2015   Procedure: ESOPHAGEAL BANDING;  Surgeon: Daneil Dolin, MD;  [facility];  Service: Endoscopy;  Laterality: N/A;   ESOPHAGOGASTRODUODENOSCOPY N/A 08/29/2015   Dr. Gala Romney: non-critical Schatzki's ring, somewhat prominent small submucosal vessels without obvious varices. hyperplastic gastric polyp. 2 year surveillance   HEMORRHOID BANDING     LAPAROSCOPIC RIGHT COLECTOMY N/A 06/15/2017   Procedure: LAPAROSCOPIC ASSISTED RIGHT COLECTOMY, ERAS PATHWAY;  Surgeon: Fanny Skates, MD;  Location: [place];  Service: General;  Laterality: N/A;   POLYPECTOMY  05/19/2017   Procedure: POLYPECTOMY;  Surgeon: Daneil Dolin, MD;  [facility];  Service: Endoscopy;;   POLYPECTOMY  01/18/2019   Procedure: POLYPECTOMY;  Surgeon: Daneil Dolin, MD;  [facility];  Service: Endoscopy;;   SKIN LESION EXCISION      SOCIAL HISTORY:  Social History   Socioeconomic History   Marital status: Married    Spouse name: Not on file   Number of children: 2   Years of education: Not on file   Highest education level: Not on file  Occupational History   Occupation: Social worker Op    Employer: [employer]  Tobacco Use   Smoking status: Never Smoker   Smokeless tobacco: Never Used  Scientific laboratory technician Use: Never used  Substance and Sexual Activity   Alcohol use: No   Drug use: No   Sexual activity: Yes    Birth control/protection: Post-menopausal  Other Topics Concern   Not on file  Social History Narrative   Not on file   Social Determinants of Health   Financial Resource Strain: Low Risk    Difficulty of  Paying Living Expenses: Not hard at all  Food Insecurity: No Food Insecurity   Worried About Charity fundraiser in the Last Year: Never true   [place] in the Last Year: Never true  Transportation Needs: No Transportation Needs   Lack of Transportation (Medical): No   Lack of Transportation (Non-Medical): No  Physical Activity: Insufficiently Active   Days of Exercise per Week: 2 days   Minutes of Exercise per Session: 50 min  Stress: No Stress Concern Present   Feeling of Stress : Not at all  Social Connections: Socially Integrated   Frequency of Communication with Friends and Family: More than three times a week   Frequency of Social Gatherings with Friends and Family: More than three times a week   Attends Religious Services: More than 4 times per year   Active Member of [employer] or Organizations: Yes   Attends Archivist Meetings: 1 to 4 times per year   Marital Status: Married  Human resources officer Violence: Not At Risk   Fear of Current or Ex-Partner: No   Emotionally Abused:  No   Physically Abused: No   Sexually Abused: No    FAMILY HISTORY:  Family History  Problem Relation Age of Onset   Heart attack Mother    Stroke Mother    Aneurysm Father    Colon cancer Neg Hx     CURRENT MEDICATIONS:  Current Outpatient Medications  Medication Sig Dispense Refill   Ascorbic Acid (VITAMIN C) 1000 MG tablet Take 1,000 mg by mouth daily.     augmented betamethasone dipropionate (DIPROLENE-AF) 0.05 % cream Apply topically as needed.     Cholecalciferol (VITAMIN D3) 50 MCG (2000 UT) TABS Take 6,000 Units by mouth daily.     fluticasone (CUTIVATE) 0.05 % cream Apply 1 application topically 2 (two) times daily as needed (lichen planus).     fluticasone (FLONASE) 50 MCG/ACT nasal spray Place 1 spray into both nostrils daily as needed for allergies or rhinitis.     hydrocortisone (ANUSOL-HC) 25 MG suppository Place 1 suppository (25 mg total)  rectally every 12 (twelve) hours. (Patient taking differently: Place 25 mg rectally as needed. ) 12 suppository 1   Hydrocortisone, Perianal, (PROCTO-PAK) 1 % CREA Apply 1 application topically 2 (two) times daily as needed. Use for up to 10 days at a time 28 g 2   Magnesium 250 MG TABS Take 1 tablet by mouth daily.     milk thistle 175 MG tablet Take 1,752 mg by mouth daily.     Multiple Vitamins-Minerals (HAIR/SKIN/NAILS/BIOTIN PO) Take 1 tablet by mouth daily.     naproxen sodium (ALEVE) 220 MG tablet Take 220 mg by mouth 2 (two) times daily as needed (pain.).     OLIVE LEAF EXTRACT PO Take 1 tablet by mouth as needed.      pantoprazole (PROTONIX) 40 MG tablet Take 40 mg by mouth daily. As needed     Probiotic CAPS Take 1 capsule by mouth daily.      Quercetin 250 MG TABS Take 500 mg by mouth.      VITAMIN A PO Take 1 tablet by mouth daily.      vitamin B-12 (CYANOCOBALAMIN) 500 MCG tablet Take 500 mcg by mouth daily.     zinc gluconate 50 MG tablet Take 50 mg by mouth daily.     No current facility-administered medications for this visit.    ALLERGIES:  No Known Allergies  PHYSICAL EXAM:  Performance status (ECOG): 0 - Asymptomatic  Vitals:   07/22/20 0809  BP: 122/76  Pulse: 63  Resp: 18  Temp: (!) 96.8 F (36 C)  SpO2: 100%   Wt Readings from Last 3 Encounters:  07/22/20 153 lb (69.4 kg)  04/05/20 152 lb (68.9 kg)  01/22/20 154 lb 8 oz (70.1 kg)   Physical Exam Vitals reviewed.  Constitutional:      Appearance: Normal appearance.  Cardiovascular:     Rate and Rhythm: Normal rate and regular rhythm.     Pulses: Normal pulses.     Heart sounds: Normal heart sounds.  Pulmonary:     Effort: Pulmonary effort is normal.     Breath sounds: Normal breath sounds.  Abdominal:     Palpations: Abdomen is soft. There is no hepatomegaly, splenomegaly or mass.     Tenderness: There is no abdominal tenderness.     Hernia: No hernia is present.  Neurological:      General: No focal deficit present.     Mental Status: She is alert and oriented to person, place, and time.  Psychiatric:  Mood and Affect: Mood normal.        Behavior: Behavior normal.      LABORATORY DATA:  I have reviewed the labs as listed.  CBC Latest Ref Rng & Units 07/17/2020 01/15/2020 09/05/2019  WBC 4.0 - 10.5 K/uL 4.2 4.0 3.6(L)  Hemoglobin 12.0 - 15.0 g/dL 13.5 13.0 12.2  Hematocrit 36.0 - 46.0 % 41.6 39.7 37.7  Platelets 150 - 400 K/uL 131(L) 163 148(L)   CMP Latest Ref Rng & Units 07/17/2020 01/15/2020 09/05/2019  Glucose 70 - 99 mg/dL 109(H) 93 101(H)  BUN 8 - 23 mg/dL _0 Creatinine 0.44 - 1.00 mg/dL 0.75 0.76 0.70  Sodium 135 - 145 mmol/L 141 142 142  Potassium 3.5 - 5.1 mmol/L 3.9 3.7 3.4(L)  Chloride 98 - 111 mmol/L 105 108 105  CO2 22 - 32 mmol/L _1 Calcium 8.9 - 10.3 mg/dL 9.5 9.6 9.4  Total Protein 6.5 - 8.1 g/dL 7.8 7.6 7.4  Total Bilirubin 0.3 - 1.2 mg/dL 0.7 0.7 0.6  Alkaline Phos 38 - 126 U/L 80 66 71  AST 15 - 41 U/L 25 32 30  ALT 0 - 44 U/L _2 Lab Results  Component Value Date   CEA1 7.1 (H) 07/17/2020   CEA1 7.7 (H) 01/15/2020   CEA1 6.8 (H) 09/05/2019    DIAGNOSTIC IMAGING:  I have independently reviewed the scans and discussed with the patient. DG Chest 2 View  Result Date: 07/10/2020 CLINICAL DATA:  Chest pain EXAM: CHEST - 2 VIEW COMPARISON:  07/05/2017 FINDINGS: The heart size and mediastinal contours are within normal limits. Both lungs are clear. The visualized skeletal structures are unremarkable. IMPRESSION: No active cardiopulmonary disease. Electronically Signed   By: Ulyses Jarred M.D.   On: 07/10/2020 03:49   CT Abdomen Pelvis W Contrast  Result Date: 07/17/2020 CLINICAL DATA:  Colorectal cancer surveillance. Initial diagnosis in 2019. Patient is status post right colectomy. EXAM: CT ABDOMEN AND PELVIS WITH CONTRAST TECHNIQUE: Multidetector CT imaging of the abdomen and pelvis was performed using the standard  protocol following bolus administration of intravenous contrast. CONTRAST:  142m OMNIPAQUE IOHEXOL 300 MG/ML  SOLN COMPARISON:  CT scan 09/05/2019 FINDINGS: Lower chest: Mild dependent subpleural atelectasis/edema but no infiltrates or effusions. No worrisome pulmonary lesions. The heart is within normal limits in size. No pericardial effusion. Hepatobiliary: No worrisome hepatic lesions to suggest hepatic metastatic disease. No intrahepatic biliary dilatation. The gallbladder contains numerous small gallstones but no findings for acute cholecystitis. No common bile duct dilatation. Pancreas: No mass, inflammation or ductal dilatation. Spleen: Normal size.  No focal lesions. Adrenals/Urinary Tract: The adrenal glands are normal in stable. Stable bilateral renal calculi. No obstructing ureteral calculi or hydroureteronephrosis. No worrisome renal lesions. The bladder is unremarkable. Stomach/Bowel: The stomach, duodenum, small bowel and colon are unremarkable. No acute inflammatory changes, mass lesions or obstructive findings. There are stable surgical changes related to a partial right hemicolectomy. No findings suspicious for recurrent tumor. Vascular/Lymphatic: The aorta and branch vessels are patent. Minimal scattered atherosclerotic calcifications. The major venous structures are patent. No mesenteric or retroperitoneal mass or adenopathy. Small scattered lymph nodes are stable. Reproductive: The uterus and ovaries are unremarkable. Other: No pelvic mass or adenopathy. No free pelvic fluid collections. No inguinal mass or adenopathy. No abdominal wall hernia or subcutaneous lesions. Musculoskeletal: No significant bony findings. Stable degenerative changes at L3-4 and L4-5. IMPRESSION: 1. Stable surgical changes related to a partial right hemicolectomy.  No findings suspicious for recurrent tumor, regional lymphadenopathy or metastatic disease. 2. Stable bilateral renal calculi. 3. Cholelithiasis.  Electronically Signed   By: Marijo Sanes M.D.   On: 07/17/2020 13:00     ASSESSMENT:  1. Stage II (T2N0) hepatic flexure colon adenocarcinoma: -Status post right colon segmental resection on 06/15/2017, 3.2 cm tumor, 0/12 lymph nodes positive. MMR normal/MSI low. -CEA a month after surgery was elevated at 6.3. CT of the chest did not show any metastatic disease. -PET/CT scan on 08/16/2017 did not show any evidence of recurrence or metastatic disease. Her CEA elevation is likely from her liver disease. She has a history of hepatitis C and was treated for it. -CT scan of the abdomen pelvis on 02/23/2018 showed postoperative changes of partial right colectomy without definite evidence of local or metastatic disease. There is mild narrowing in the region of hepatic flexure, presumably at surgical anastomosis. -CEA on 06/01/2018 has increased to 8.9. Prior CEA 3 months ago was 7.7. -PET CT scan on 06/22/2018 showed no findings of recurrent or metastatic colon cancer. Asymmetric palatine tonsillar activity, left greater than right. -CT of the abdomen pelvis on 01/11/2019 was negative for any recurrent or metastatic disease. -Patient had a colonoscopy on 01/18/2019 they found 2 polyps that were negative for malignancy. -CEA slightly elevated at 8.0. We will continue to monitor this closely. -Labs done on 05/31/2019 showed CEA level 8.8. -CT AP done on 09/05/2019 showed no evidence of recurrent malignancy. -Last colonoscopy was on 01/18/2019.  2. Hepatitis C: -Patient had a blood transfusion before 1974 was told she contracted hepatitis C. -She was treated 5 years ago for hepatitis C.   PLAN:  1. Stage II (T2N0) hepatic flexure colon adenocarcinoma: -No change in bowel habits.  No significant bleeding per rectum. -Physical exam was normal.  CEA was elevated at 7.1 and at her baseline. -Reviewed CTAP from 07/17/2020 which showed stable surgical changes in the right hemicolectomy area.  No  suspicious findings for recurrence or metastatic disease.  Stable bilateral renal calculi and cholelithiasis. -Recommend follow-up in 6 months with labs.  Will do CT scan in a year.  2. Iron deficiency state: -Hemoglobin today is 13.5 with MCV 93.  3. Hepatitis C: -She was treated for hepatitis C in the past. -LFTs are normal.  Elevated CEA could be secondary to hepatitis C.   Orders placed this encounter:  Orders Placed This Encounter  Procedures   CBC with Differential/Platelet   Comprehensive metabolic panel   CEA     Derek Jack, MD [place] [phone]   I, Milinda Antis, am acting as a scribe for Dr. Sanda Linger.  I, Derek Jack MD, have reviewed the above documentation for accuracy and completeness, and I agree with the above.

## 2020-07-22 NOTE — Patient Instructions (Signed)
[facility] at [facility] Discharge Instructions  You were seen today by Dr. Katragadda. He went over your recent results and scans. Dr. Katragadda will see you back in 6 months for labs and follow up.   Thank you for choosing [facility] at [facility] to provide your oncology and hematology care.  To afford each patient quality time with our provider, please arrive at least 15 minutes before your scheduled appointment time.   If you have a lab appointment with the Cancer Center please come in thru the Main Entrance and check in at the main information desk  You need to re-schedule your appointment should you arrive 10 or more minutes late.  We strive to give you quality time with our providers, and arriving late affects you and other patients whose appointments are after yours.  Also, if you no show three or more times for appointments you may be dismissed from the clinic at the providers discretion.     Again, thank you for choosing [facility].  Our hope is that these requests will decrease the amount of time that you wait before being seen by our physicians.       _____________________________________________________________  Should you have questions after your visit to [facility], please contact our office at [phone] between the hours of 8:00 a.m. and 4:30 p.m.  Voicemails left after 4:00 p.m. will not be returned until the following business day.  For prescription refill requests, have your pharmacy contact our office and allow 72 hours.    Cancer Center Support Programs:   > Cancer Support Group  2nd Tuesday of the month 1pm-2pm, Journey Room   

## 2020-08-01 NOTE — Progress Notes (Signed)
PCP:  Sharilyn Sites, MD  Cardiologist:  Jenkins Rouge, MD  Electrophysiologist:  None   Evaluation Performed:  Follow-Up Visit  Chief Complaint: 37-month visit  History of Present Illness:    Ariana Long is a 65 y.o. female with past medical history of palpitations (PAC's by prior monitoring), liver cirrhosis, Hepatitis C (treated) and colon cancer (s/p segmental resection in 06/2017) who presents for  F/u of palpitations I have not seen her since 2017  Seen by PA July 2020 with palpitations. Monitor with PVCls that were symptomatic Supposed to start Toprol but she cut back on her chocolate and cafeinne and has not had to use beta blocker   She has no cardiac symptoms Gets tired looking after her daughters two children ages 75 and < 1 year. She has a son with two older children in town as well   She is retired    Past Medical History:  Diagnosis Date  . Cirrhosis of liver ([place]) 01/26/2011   FIBROSIS on liver biopsy in 2007, U/S  03/31/2013= mild diffuse hepatic steatosis and /or hepatocellular disease without focal hepatic parenchymal abnormality. per Dr. Patsy [place] (2012) pt is immune to hep A and Hep B. per 12/06/13 note from Liver clinic-pt had MRI which showed cirrhosis 2015.  . Diverticulitis of colon   . Elevated AFP 01/26/2011  . Gall stones   . Hematuria 12/12/2014  . Hemorrhoid 12/01/2012  . Hemorrhoids   . Hepatitis C 01/26/2011   2005 non responder, genotype 1, Gr 1-2, Stage 3 . Treated with Harvoni at the Liver clinic, responder  . Hidradenitis   . History of hiatal hernia   . History of kidney stones   . Hx: UTI (urinary tract infection)   . Kidney stones 10/2011  . Osteoporosis   . Palpitations   . Primary cancer of hepatic flexure of colon ([place]) 06/15/2017  . Serrated adenoma of colon 11/2011  . Splenomegaly 01/26/2011  . Thrombocytopenia ([place]) 01/26/2011  . Tubular adenoma   . Vulval lesion 12/14/2013   Has kissing lesions thin and puffy and paler i color about 5-6-  mm in diameter.Dr Glo Herring in to co examine will try temovate and recheck in 3 months   Past Surgical History:  Procedure Laterality Date  . AXILLARY SURGERY Bilateral 1979   "glands removed"  . BREAST BIOPSY Right 2011  . BREAST EXCISIONAL BIOPSY Bilateral 25 years ago   Benign, fatty tumors  . CESAREAN SECTION     x 2  . COLECTOMY  06/15/2017   LAPAROSCOPIC ASSISTED RIGHT COLECTOMY, ERAS PATHWAY/notes 06/15/2017  . COLONOSCOPY  07/31/2005   [place]k-Normal rectum/Normal colonoscopy/Repeat screening colonoscopy ten years  . COLONOSCOPY  11/16/2011   RMR: Friable anorectum-likely source of hematochezia/ LARGE 1.2x2.5CM SERRATED ADENOMA resected from ascending colon 7CM DISTAL TO ICV, hyperplastic polyp removed   . COLONOSCOPY N/A 07/18/2012   Dr. Gala Romney- normal appearing rectal mucosa. no residual polpy tissue seen at tatoo location. remainder of colonic mucosa appeared entirely normal. bx = tubular adenoma  . COLONOSCOPY N/A 08/29/2015   Dr. Gala Romney: single 12 mm polyp in ascending colon, 4 mm polyp ascending. Sessile serrated adenoma. 3 year surveillance  . COLONOSCOPY N/A 05/19/2017   4X2 cm sessile lesion at hepatic flexdure, unable to be completely removed  . COLONOSCOPY N/A 01/18/2019   Dr. Gala Romney: Several small polyps removed, hyperplastic.  Next colonoscopy in 5 years.  . ESOPHAGEAL BANDING N/A 08/29/2015   Procedure: ESOPHAGEAL BANDING;  Surgeon: Daneil Dolin, MD;  [facility];  Service: Endoscopy;  Laterality: N/A;  . ESOPHAGOGASTRODUODENOSCOPY N/A 08/29/2015   Dr. Gala Romney: non-critical Schatzki's ring, somewhat prominent small submucosal vessels without obvious varices. hyperplastic gastric polyp. 2 year surveillance  . HEMORRHOID BANDING    . LAPAROSCOPIC RIGHT COLECTOMY N/A 06/15/2017   Procedure: LAPAROSCOPIC ASSISTED RIGHT COLECTOMY, ERAS PATHWAY;  Surgeon: Fanny Skates, MD;  Location: [facility];  Service: General;  Laterality: N/A;  . POLYPECTOMY  05/19/2017   Procedure:  POLYPECTOMY;  Surgeon: Daneil Dolin, MD;  [facility]: [facility];  Service: Endoscopy;;  . POLYPECTOMY  01/18/2019   Procedure: POLYPECTOMY;  Surgeon: Daneil Dolin, MD;  [facility];  Service: Endoscopy;;  . SKIN LESION EXCISION       Current Meds  Medication Sig  . Ascorbic Acid (VITAMIN C) 1000 MG tablet Take 1,000 mg by mouth daily.  Marland Kitchen augmented betamethasone dipropionate (DIPROLENE-AF) 0.05 % cream Apply topically as needed.  . Cholecalciferol (VITAMIN D3) 50 MCG (2000 UT) TABS Take 6,000 Units by mouth daily.  . fluticasone (CUTIVATE) 0.05 % cream Apply 1 application topically 2 (two) times daily as needed (lichen planus).  . fluticasone (FLONASE) 50 MCG/ACT nasal spray Place 1 spray into both nostrils daily as needed for allergies or rhinitis.  . hydrocortisone (ANUSOL-HC) 25 MG suppository Place 1 suppository (25 mg total) rectally every 12 (twelve) hours. (Patient taking differently: Place 25 mg rectally as needed.)  . Hydrocortisone, Perianal, (PROCTO-PAK) 1 % CREA Apply 1 application topically 2 (two) times daily as needed. Use for up to 10 days at a time  . Magnesium 250 MG TABS Take 1 tablet by mouth daily.  . milk thistle 175 MG tablet Take 1,752 mg by mouth daily.  . Multiple Vitamins-Minerals (HAIR/SKIN/NAILS/BIOTIN PO) Take 1 tablet by mouth daily.  . naproxen sodium (ALEVE) 220 MG tablet Take 220 mg by mouth 2 (two) times daily as needed (pain.).  Marland Kitchen OLIVE LEAF EXTRACT PO Take 1 tablet by mouth as needed.   . pantoprazole (PROTONIX) 40 MG tablet Take 40 mg by mouth daily. As needed  . Probiotic CAPS Take 1 capsule by mouth daily.   . Quercetin 250 MG TABS Take 500 mg by mouth.   Marland Kitchen VITAMIN A PO Take 1 tablet by mouth daily.   . vitamin B-12 (CYANOCOBALAMIN) 500 MCG tablet Take 500 mcg by mouth daily.  Marland Kitchen zinc gluconate 50 MG tablet Take 50 mg by mouth daily.     Allergies:   Patient has no known allergies.   Social History   Tobacco Use  . Smoking  status: Never Smoker  . Smokeless tobacco: Never Used  Vaping Use  . Vaping Use: Never used  Substance Use Topics  . Alcohol use: No  . Drug use: No     Family Hx: The patient's family history includes Aneurysm in her father; Heart attack in her mother; Stroke in her mother. There is no history of Colon cancer.  ROS:   Please see the history of present illness.     All other systems reviewed and are negative.   Prior CV studies:   The following studies were reviewed today:  Echocardiogram: 10/2012 Study Conclusions   - Left ventricle: The cavity size was normal. Wall thickness  was normal. Systolic function was normal. The estimated  ejection fraction was in the range of 55% to 60%. Wall  motion was normal; there were no regional wall motion  abnormalities. Left ventricular diastolic function  parameters  were normal.   Event Monitor: 11/2018 NSR Symptomatic PVC;s No significant NSVT or PaF  Labs/Other Tests and Data Reviewed:    EKG:  07/05/17 SB rate 58 normal  08/12/2020 SR rate 74 nonspecific ST changes   Recent Labs: 07/17/2020: ALT 26; BUN 14; Creatinine, Ser 0.75; Hemoglobin 13.5; Platelets 131; Potassium 3.9; Sodium 141   Recent Lipid Panel No results found for: CHOL, TRIG, HDL, CHOLHDL, LDLCALC, LDLDIRECT  Wt Readings from Last 3 Encounters:  08/12/20 69.2 kg  07/22/20 69.4 kg  04/05/20 68.9 kg     Objective:    Vital Signs:  BP 118/80   Pulse 72   Ht 5' 6.5" (1.689 m)   Wt 69.2 kg   SpO2 97%   BMI 24.26 kg/m    Affect appropriate Healthy:  appears stated age HEENT: normal Neck supple with no adenopathy JVP normal no bruits no thyromegaly Lungs clear with no wheezing and good diaphragmatic motion Heart:  S1/S2 no murmur, no rub, gallop or click PMI normal Abdomen: benighn, BS positve, no tenderness, no AAA no bruit.  No HSM or HJR Distal pulses intact with no bruits No edema Neuro non-focal Skin warm and dry No muscular  weakness   ASSESSMENT & PLAN:    1. Palpitations - benign in setting of no structural heart disease. PRN beta blocker    2. Hep C/Cirrhosis:  Rx   3. Colon Cancer Stage 2A CEA 7.1 stable over last 2 years f/u oncology  F/U Cardiology PRN   Medication Adjustments/Labs and Tests Ordered: Current medicines are reviewed at length with the patient today.  Concerns regarding medicines are outlined above.   Tests Ordered: Orders Placed This Encounter  Procedures  . EKG 12-Lead    Medication Changes: No orders of the defined types were placed in this encounter.   Follow Up:  1 year   Signed, Jenkins Rouge, MD  08/12/2020 9:25 AM    [place]

## 2020-08-07 DIAGNOSIS — J069 Acute upper respiratory infection, unspecified: Secondary | ICD-10-CM | POA: Diagnosis not present

## 2020-08-12 ENCOUNTER — Ambulatory Visit (INDEPENDENT_AMBULATORY_CARE_PROVIDER_SITE_OTHER): Payer: Medicare Other | Admitting: Cardiovascular Disease

## 2020-08-12 ENCOUNTER — Ambulatory Visit: Payer: Medicare Other | Admitting: Gastroenterology

## 2020-08-12 ENCOUNTER — Other Ambulatory Visit: Payer: Self-pay

## 2020-08-12 ENCOUNTER — Encounter: Payer: Self-pay | Admitting: Cardiovascular Disease

## 2020-08-12 VITALS — BP 118/80 | HR 72 | Ht 66.5 in | Wt 152.6 lb

## 2020-08-12 DIAGNOSIS — R002 Palpitations: Secondary | ICD-10-CM | POA: Diagnosis not present

## 2020-08-12 NOTE — Patient Instructions (Signed)
Medication Instructions:  Your physician recommends that you continue on your current medications as directed. Please refer to the Current Medication list given to you today.  *If you need a refill on your cardiac medications before your next appointment, please call your pharmacy*   Lab Work: None today  If you have labs (blood work) drawn today and your tests are completely normal, you will receive your results only by: MyChart Message (if you have MyChart) OR A paper copy in the mail If you have any lab test that is abnormal or we need to change your treatment, we will call you to review the results.   Testing/Procedures: None today    Follow-Up: At [facility], you and your health needs are our priority.  As part of our continuing mission to provide you with exceptional heart care, we have created designated Provider Care Teams.  These Care Teams include your primary Cardiologist (physician) and Advanced Practice Providers (APPs -  Physician Assistants and Nurse Practitioners) who all work together to provide you with the care you need, when you need it.  We recommend signing up for the patient portal called "MyChart".  Sign up information is provided on this After Visit Summary.  MyChart is used to connect with patients for Virtual Visits (Telemedicine).  Patients are able to view lab/test results, encounter notes, upcoming appointments, etc.  Non-urgent messages can be sent to your provider as well.   To learn more about what you can do with MyChart, go to [email or web address].    Your next appointment:  As needed     

## 2020-09-27 ENCOUNTER — Ambulatory Visit: Payer: Medicare Other | Admitting: Adult Health

## 2020-09-27 ENCOUNTER — Ambulatory Visit: Payer: Medicare Other | Admitting: Internal Medicine

## 2020-10-16 DIAGNOSIS — H35413 Lattice degeneration of retina, bilateral: Secondary | ICD-10-CM | POA: Diagnosis not present

## 2020-10-16 DIAGNOSIS — H40013 Open angle with borderline findings, low risk, bilateral: Secondary | ICD-10-CM | POA: Diagnosis not present

## 2020-10-16 DIAGNOSIS — H25813 Combined forms of age-related cataract, bilateral: Secondary | ICD-10-CM | POA: Diagnosis not present

## 2020-10-16 DIAGNOSIS — H43812 Vitreous degeneration, left eye: Secondary | ICD-10-CM | POA: Diagnosis not present

## 2020-10-28 DIAGNOSIS — J069 Acute upper respiratory infection, unspecified: Secondary | ICD-10-CM | POA: Diagnosis not present

## 2020-10-28 DIAGNOSIS — Z681 Body mass index (BMI) 19 or less, adult: Secondary | ICD-10-CM | POA: Diagnosis not present

## 2020-11-01 DIAGNOSIS — Z20822 Contact with and (suspected) exposure to covid-19: Secondary | ICD-10-CM | POA: Diagnosis not present

## 2020-11-11 ENCOUNTER — Ambulatory Visit: Payer: Medicare Other | Admitting: Physician Assistant

## 2020-12-04 DIAGNOSIS — Z20822 Contact with and (suspected) exposure to covid-19: Secondary | ICD-10-CM | POA: Diagnosis not present

## 2020-12-10 ENCOUNTER — Other Ambulatory Visit: Payer: Self-pay

## 2020-12-10 DIAGNOSIS — K7460 Unspecified cirrhosis of liver: Secondary | ICD-10-CM

## 2021-01-20 ENCOUNTER — Inpatient Hospital Stay (HOSPITAL_COMMUNITY): Payer: Medicare Other

## 2021-01-22 ENCOUNTER — Other Ambulatory Visit: Payer: Self-pay

## 2021-01-22 ENCOUNTER — Inpatient Hospital Stay (HOSPITAL_COMMUNITY): Payer: Medicare Other | Attending: Hematology

## 2021-01-22 DIAGNOSIS — Z79899 Other long term (current) drug therapy: Secondary | ICD-10-CM | POA: Diagnosis not present

## 2021-01-22 DIAGNOSIS — E611 Iron deficiency: Secondary | ICD-10-CM | POA: Diagnosis not present

## 2021-01-22 DIAGNOSIS — Z85038 Personal history of other malignant neoplasm of large intestine: Secondary | ICD-10-CM | POA: Diagnosis not present

## 2021-01-22 DIAGNOSIS — B1920 Unspecified viral hepatitis C without hepatic coma: Secondary | ICD-10-CM | POA: Diagnosis not present

## 2021-01-22 DIAGNOSIS — Z9049 Acquired absence of other specified parts of digestive tract: Secondary | ICD-10-CM | POA: Insufficient documentation

## 2021-01-22 DIAGNOSIS — C183 Malignant neoplasm of hepatic flexure: Secondary | ICD-10-CM

## 2021-01-22 LAB — COMPREHENSIVE METABOLIC PANEL
ALT: 23 U/L (ref 0–44)
AST: 25 U/L (ref 15–41)
Albumin: 3.9 g/dL (ref 3.5–5.0)
Alkaline Phosphatase: 82 U/L (ref 38–126)
Anion gap: 7 (ref 5–15)
BUN: 11 mg/dL (ref 8–23)
CO2: 25 mmol/L (ref 22–32)
Calcium: 9 mg/dL (ref 8.9–10.3)
Chloride: 106 mmol/L (ref 98–111)
Creatinine, Ser: 0.68 mg/dL (ref 0.44–1.00)
GFR, Estimated: >60 mL/min (ref >60)
Glucose, Bld: 95 mg/dL (ref 70–99)
Potassium: 3.7 mmol/L (ref 3.5–5.1)
Sodium: 138 mmol/L (ref 135–145)
Total Bilirubin: 0.6 mg/dL (ref 0.3–1.2)
Total Protein: 7.6 g/dL (ref 6.5–8.1)

## 2021-01-22 LAB — CBC WITH DIFFERENTIAL/PLATELET
Abs Immature Granulocytes: 0.01 10*3/uL (ref 0.00–0.07)
Basophils Absolute: 0 10*3/uL (ref 0.0–0.1)
Basophils Relative: 1 %
Eosinophils Absolute: 0.1 10*3/uL (ref 0.0–0.5)
Eosinophils Relative: 3 %
HCT: 39.2 % (ref 36.0–46.0)
Hemoglobin: 13.1 g/dL (ref 12.0–15.0)
Immature Granulocytes: 0 %
Lymphocytes Relative: 35 %
Lymphs Abs: 1.6 10*3/uL (ref 0.7–4.0)
MCH: 31.1 pg (ref 26.0–34.0)
MCHC: 33.4 g/dL (ref 30.0–36.0)
MCV: 93.1 fL (ref 80.0–100.0)
Monocytes Absolute: 0.5 10*3/uL (ref 0.1–1.0)
Monocytes Relative: 10 %
Neutro Abs: 2.3 10*3/uL (ref 1.7–7.7)
Neutrophils Relative %: 51 %
Platelets: 153 10*3/uL (ref 150–400)
RBC: 4.21 MIL/uL (ref 3.87–5.11)
RDW: 13.6 % (ref 11.5–15.5)
WBC: 4.5 10*3/uL (ref 4.0–10.5)
nRBC: 0 % (ref 0.0–0.2)

## 2021-01-23 LAB — CEA: CEA: 6.7 ng/mL — ABNORMAL HIGH (ref 0.0–4.7)

## 2021-01-27 ENCOUNTER — Inpatient Hospital Stay (HOSPITAL_COMMUNITY): Payer: Medicare Other | Admitting: Hematology

## 2021-01-27 DIAGNOSIS — J209 Acute bronchitis, unspecified: Secondary | ICD-10-CM | POA: Diagnosis not present

## 2021-01-27 DIAGNOSIS — J329 Chronic sinusitis, unspecified: Secondary | ICD-10-CM | POA: Diagnosis not present

## 2021-01-29 ENCOUNTER — Ambulatory Visit (INDEPENDENT_AMBULATORY_CARE_PROVIDER_SITE_OTHER): Payer: Medicare Other | Admitting: Gastroenterology

## 2021-01-29 ENCOUNTER — Other Ambulatory Visit: Payer: Self-pay

## 2021-01-29 ENCOUNTER — Encounter: Payer: Self-pay | Admitting: Gastroenterology

## 2021-01-29 VITALS — BP 117/73 | HR 64 | Temp 97.1°F | Ht 67.0 in | Wt 154.4 lb

## 2021-01-29 DIAGNOSIS — K219 Gastro-esophageal reflux disease without esophagitis: Secondary | ICD-10-CM

## 2021-01-29 DIAGNOSIS — R300 Dysuria: Secondary | ICD-10-CM | POA: Diagnosis not present

## 2021-01-29 DIAGNOSIS — K7460 Unspecified cirrhosis of liver: Secondary | ICD-10-CM

## 2021-01-29 DIAGNOSIS — R1033 Periumbilical pain: Secondary | ICD-10-CM

## 2021-01-29 MED ORDER — HYDROCORTISONE (PERIANAL) 2.5 % EX CREA: 1.0000 "application " | TOPICAL_CREAM | Freq: Two times a day (BID) | CUTANEOUS | 0 refills | Status: DC

## 2021-01-29 MED ORDER — FLUCONAZOLE 150 MG PO TABS: ORAL_TABLET | ORAL | 0 refills | Status: DC

## 2021-01-29 NOTE — Patient Instructions (Addendum)
If you continue to have pain with urination, complete urine specimen.  Take to [facility]. [employer] does not cover hemorrhoid suppositories.  They do cover cream.  Prescription for Anusol cream sent to your pharmacy, insert anal rectally twice daily for 14 days. For vaginal itching, take Diflucan 150 mg by mouth, repeat in 72 hours. Monitor abdominal pain, if continues to occur with next episode of severe discomfort (as discussed), let me know and we would consider CT scan at that time. Discuss abdominal discomfort with Dr. Delton Coombes at upcoming office visit to see if he recommends CT from his standpoint. Return to the office in 6 months or call sooner if needed.

## 2021-01-29 NOTE — Progress Notes (Signed)
Primary Care Physician: Sharilyn Sites, MD  Primary Gastroenterologist:  Garfield Cornea, MD   Chief Complaint  Patient presents with   Cirrhosis    F/u   Gastroesophageal Reflux    F/u. Doing okay   Abdominal Pain    Lower abd comes/goes    HPI: Ariana Long is a 65 y.o. female here for follow-up of cirrhosis, GERD.  She has a history of stage II colon cancer involving the hepatic flexure diagnosed in February 2019.  Underwent right colon segmental resection.  Also with history of hepatitis C, status post eradication.  EGD in 2017 negative for esophageal varices.  Recommended 2-year surveillance.  Last colonoscopy September 2020, several hyperplastic polyps removed, next surveillance in 5 years.  She was scheduled for EGD for esophageal variceal screening but requested to postpone procedure.  Previously did not tolerate hemorrhoid banding.  CT abdomen pelvis with contrast March 2022: No worrisome hepatic lesions.  Spleen normal in size.  Stable surgical changes related to partial right hemicolectomy.  Cholelithiasis.  Stable bilateral renal calculi.  MELD Na of 6 back in March.  Recent labs with hematology/oncology showed normal sodium of 138, creatinine 0.68, total bilirubin 0.6, alkaline phosphatase 82, AST 25, ALT 23, white blood cell count 4500, hemoglobin 13.1, platelets 153,000.  No INR available to calculate MELD however given normal values would anticipate preserved hepatic function.  Patient reports episodes of waking up due to abdominal pain.  Has occurred 3 times in the past 6 months.  Last episode 2 months ago.  Symptoms last for about 30 minutes at a time.  Describes mid to lower abdominal pain which can be severe.  She gets up and walks and massages her abdomen.  Eventually she will have a bowel movement and symptoms will resolve.  No associated nausea or vomiting. Never constipated. Stools are not soft. BM typically twice per day. No melena, brbpr.  She sees white creamy  stuff when she wipes after a bowel movement.  Similar findings when she wipes around the vaginal area.  She worries that this is yeast.  She has lichen planus in the area.  Describes itching within the rectum and vaginal itching.  Hard to sort out her symptoms.  No heartburn or indigestion.    Current Outpatient Medications  Medication Sig Dispense Refill   Ascorbic Acid (VITAMIN C) 1000 MG tablet Take 1,000 mg by mouth daily.     augmented betamethasone dipropionate (DIPROLENE-AF) 0.05 % cream Apply topically as needed.     Cholecalciferol (VITAMIN D3) 50 MCG (2000 UT) TABS Take 6,000 Units by mouth daily.     fluticasone (CUTIVATE) 0.05 % cream Apply 1 application topically 2 (two) times daily as needed (lichen planus).     fluticasone (FLONASE) 50 MCG/ACT nasal spray Place 1 spray into both nostrils daily as needed for allergies or rhinitis.     hydrocortisone (ANUSOL-HC) 25 MG suppository Place 1 suppository (25 mg total) rectally every 12 (twelve) hours. (Patient taking differently: Place 25 mg rectally as needed.) 12 suppository 1   Hydrocortisone, Perianal, (PROCTO-PAK) 1 % CREA Apply 1 application topically 2 (two) times daily as needed. Use for up to 10 days at a time 28 g 2   Magnesium 250 MG TABS Take 1 tablet by mouth daily.     milk thistle 175 MG tablet Take 1,752 mg by mouth daily.     Multiple Vitamins-Minerals (HAIR/SKIN/NAILS/BIOTIN PO) Take 1 tablet by mouth daily.  naproxen sodium (ALEVE) 220 MG tablet Take 220 mg by mouth 2 (two) times daily as needed (pain.).     OLIVE LEAF EXTRACT PO Take 1 tablet by mouth as needed.      pantoprazole (PROTONIX) 40 MG tablet Take 40 mg by mouth daily. As needed     Probiotic CAPS Take 1 capsule by mouth daily.      Quercetin 250 MG TABS Take 500 mg by mouth.      VITAMIN A PO Take 1 tablet by mouth daily.      vitamin B-12 (CYANOCOBALAMIN) 500 MCG tablet Take 500 mcg by mouth daily.     zinc gluconate 50 MG tablet Take 50 mg by mouth  daily.     No current facility-administered medications for this visit.    Allergies as of 01/29/2021   (No Known Allergies)    ROS:  General: Negative for anorexia, weight loss, fever, chills, fatigue, weakness. ENT: Negative for hoarseness, difficulty swallowing , nasal congestion. CV: Negative for chest pain, angina, palpitations, dyspnea on exertion, peripheral edema.  Respiratory: Negative for dyspnea at rest, dyspnea on exertion, cough, sputum, wheezing.  GI: See history of present illness. GU:  Negative for dysuria, hematuria, urinary incontinence, urinary frequency, nocturnal urination.  Endo: Negative for unusual weight change.    Physical Examination:   BP 117/73   Pulse 64   Temp (!) 97.1 F (36.2 C)   Ht 5\' 7"  (1.702 m)   Wt 154 lb 6.4 oz (70 kg)   BMI 24.18 kg/m   General: Well-nourished, well-developed in no acute distress.  Eyes: No icterus. Mouth: masked Abdomen: Bowel sounds are normal, nontender, nondistended, no hepatosplenomegaly or masses, no abdominal bruits or hernia , no rebound or guarding.   Rectal: dry thickened skin in perianal/vulvar area. Nontender rectal exam. No stool present. No masses.  Extremities: No lower extremity edema. No clubbing or deformities. Neuro: Alert and oriented x 4   Skin: Warm and dry, no jaundice.   Psych: Alert and cooperative, normal mood and affect.  Labs:  Lab Results  Component Value Date   CREATININE 0.68 01/22/2021   BUN 11 01/22/2021   NA 138 01/22/2021   K 3.7 01/22/2021   CL 106 01/22/2021   CO2 25 01/22/2021   Lab Results  Component Value Date   ALT 23 01/22/2021   AST 25 01/22/2021   ALKPHOS 82 01/22/2021   BILITOT 0.6 01/22/2021   Lab Results  Component Value Date   WBC 4.5 01/22/2021   HGB 13.1 01/22/2021   HCT 39.2 01/22/2021   MCV 93.1 01/22/2021   PLT 153 01/22/2021   No results found for: CEA   Imaging Studies: No results found.   Assessment:  History of colon cancer:  Continues to follow with Dr. Delton Coombes.  Imaging up-to-date back in March as described above.  Colonoscopy planned for September 2025 unless she has new symptoms.  GERD: Well-controlled.  Anorectal itching: Digital rectal exam unremarkable.  Colonoscopy completed September 2020.  Suspect benign anorectal source such as internal hemorrhoids.  She is concerned about the possibility of yeast in her colon and vaginal area.  Fortunately yeast is not a pathogen in the colon, explained this to the patient today.  It is difficult to sort out her perianal/vaginal issues due to lichen planus.  Dysuria: Started this morning.  We will have her monitor for now but if continues, will collect urine specimen.  History of hepatitis C status post eradication: Previous imaging via  MRI in 2015 and PET scan in April 2019 with concern for nodular liver/suggesting cirrhosis.  EGD in 2017 with no evidence of portal hypertensive gastropathy or esophageal varices.  She has had several CT scans with contrast for colon cancer surveillance, liver appears to be looking normal with no splenomegaly or other findings suggestive of advanced liver disease.  Grossly liver appeared normal at time of hemicolectomy in 2019.  She had a liver biopsy in 2007l with fibrosis at that time.  We will continue to monitor closely.  Abdominal pain: 3 episodes waking her up from sleep over the past 6 months.  The last episode 2 months ago.  Symptoms lasting for 30 minutes, improved with walking and massaging the abdomen.  Abdominal pain resolving after bowel movement. Query partial twist or obstruction. Patient will continue to monitor, consider CT imaging with next episode.    Plan: Urine specimen if persistent dysuria over the next 24 to 48 hours.  Patient has orders.   Trial of Anusol cream and rectally twice daily for 14 days.   Diflucan 150 mg p.o. once, repeat in 72 hours.   For now she will continue to monitor for recurrent abdominal pain.   With an additional episode, consider CT imaging.  Patient will also discuss with Dr. Delton Coombes at upcoming visit.  Currently last CT imaging in March plans for repeat in 1 year.  If she has recurrent abdominal pain may require updated imaging sooner than planned.   Next colonoscopy planned for September 2025 unless she develops new symptoms.  Patient would like to hold off on upper endoscopy to screen for esophageal varices at the same time, this is reasonable given no evidence of advanced liver disease on imaging.   Return to the office in 6 months or call sooner if needed.

## 2021-02-18 NOTE — Progress Notes (Signed)
[facility] [address], [place] [address]   CLINIC:  Medical Oncology/Hematology  PCP:  Ariana Found, MD [address] / [place] [address] [phone]   REASON FOR VISIT:  Follow-up for stage II colon cancer  PRIOR THERAPY: Right colon segmental resection on 06/15/2017  NGS Results: not done  CURRENT THERAPY: surveillance  BRIEF ONCOLOGIC HISTORY:  Oncology History  Primary cancer of hepatic flexure of colon (HCC)  06/15/2017 Initial Diagnosis   Primary cancer of hepatic flexure of colon (HCC)   06/15/2017 Cancer Staging   Staging form: Colon and Rectum - Neuroendocine Tumors, AJCC 8th Edition - Clinical stage from 06/15/2017: Stage IIA (cT2, cN0, cM0) - Signed by Doreatha Massed, MD on 08/18/2017     CANCER STAGING: Cancer Staging Primary cancer of hepatic flexure of colon [facility]) Staging form: Colon and Rectum - Neuroendocine Tumors, AJCC 8th Edition - Clinical stage from 06/15/2017: Stage IIA (cT2, cN0, cM0) - Signed by Doreatha Massed, MD on 08/18/2017   INTERVAL HISTORY:  [name], a 65 y.o. female, returns for routine follow-up of her stage II colon cancer. Ariana Long was last seen on 07/22/2020.   Today she reports feeling good. She reports regular BM and denies hematochezia.   REVIEW OF SYSTEMS:  Review of Systems  Constitutional:  Negative for appetite change and fatigue.  Gastrointestinal:  Negative for blood in stool, constipation and diarrhea.  All other systems reviewed and are negative.  PAST MEDICAL/SURGICAL HISTORY:  Past Medical History:  Diagnosis Date   Cirrhosis of liver (HCC) 01/26/2011   FIBROSIS on liver biopsy in 2007, U/S  03/31/2013= mild diffuse hepatic steatosis and /or hepatocellular disease without focal hepatic parenchymal abnormality. per Dr. Jacqualine Mau (2012) pt is immune to hep A and Hep B. per 12/06/13 note from Liver clinic-pt had MRI which showed cirrhosis 2015.   Diverticulitis of colon     Elevated AFP 01/26/2011   Gall stones    Hematuria 12/12/2014   Hemorrhoid 12/01/2012   Hemorrhoids    Hepatitis C 01/26/2011   2005 non responder, genotype 1, Gr 1-2, Stage 3 . Treated with Harvoni at the Liver clinic, responder   Hidradenitis    History of hiatal hernia    History of kidney stones    Hx: UTI (urinary tract infection)    Kidney stones 10/2011   Osteoporosis    Palpitations    Primary cancer of hepatic flexure of colon (HCC) 06/15/2017   Serrated adenoma of colon 11/2011   Splenomegaly 01/26/2011   Thrombocytopenia (HCC) 01/26/2011   Tubular adenoma    Vulval lesion 12/14/2013   Has kissing lesions thin and puffy and paler i color about 5-6- mm in diameter.Dr Emelda Fear in to co examine will try temovate and recheck in 3 months   Past Surgical History:  Procedure Laterality Date   AXILLARY SURGERY Bilateral 1979   "glands removed"   BREAST BIOPSY Right 2011   BREAST EXCISIONAL BIOPSY Bilateral 25 years ago   Benign, fatty tumors   CESAREAN SECTION     x 2   COLECTOMY  06/15/2017   LAPAROSCOPIC ASSISTED RIGHT COLECTOMY, ERAS PATHWAY/notes 06/15/2017   COLONOSCOPY  07/31/2005   Rourk-Normal rectum/Normal colonoscopy/Repeat screening colonoscopy ten years   COLONOSCOPY  11/16/2011   RMR: Friable anorectum-likely source of hematochezia/ LARGE 1.2x2.5CM SERRATED ADENOMA resected from ascending colon 7CM DISTAL TO ICV, hyperplastic polyp removed    COLONOSCOPY N/A 07/18/2012   Dr. Jena Gauss- normal appearing rectal mucosa. no  residual polpy tissue seen at [place] location. remainder of colonic mucosa appeared entirely normal. bx = tubular adenoma   COLONOSCOPY N/A 08/29/2015   Dr. Gala Romney: single 12 mm polyp in ascending colon, 4 mm polyp ascending. Sessile serrated adenoma. 3 year surveillance   COLONOSCOPY N/A 05/19/2017   4X2 cm sessile lesion at hepatic flexdure, unable to be completely removed   COLONOSCOPY N/A 01/18/2019   Dr. Gala Romney: Several small polyps removed, hyperplastic.   Next colonoscopy in 5 years.   ESOPHAGEAL BANDING N/A 08/29/2015   Procedure: ESOPHAGEAL BANDING;  Surgeon: Daneil Dolin, MD;  [facility];  Service: Endoscopy;  Laterality: N/A;   ESOPHAGOGASTRODUODENOSCOPY N/A 08/29/2015   Dr. Gala Romney: non-critical Schatzki's ring, somewhat prominent small submucosal vessels without obvious varices. hyperplastic gastric polyp. 2 year surveillance   HEMORRHOID BANDING     LAPAROSCOPIC RIGHT COLECTOMY N/A 06/15/2017   Procedure: LAPAROSCOPIC ASSISTED RIGHT COLECTOMY, ERAS PATHWAY;  Surgeon: Fanny Skates, MD;  Location: [place];  Service: General;  Laterality: N/A;   POLYPECTOMY  05/19/2017   Procedure: POLYPECTOMY;  Surgeon: Daneil Dolin, MD;  [facility];  Service: Endoscopy;;   POLYPECTOMY  01/18/2019   Procedure: POLYPECTOMY;  Surgeon: Daneil Dolin, MD;  [facility];  Service: Endoscopy;;   SKIN LESION EXCISION      SOCIAL HISTORY:  Social History   Socioeconomic History   Marital status: Married    Spouse name: Not on file   Number of children: 2   Years of education: Not on file   Highest education level: Not on file  Occupational History   Occupation: Social worker Op    Employer: [employer]  Tobacco Use   Smoking status: Never   Smokeless tobacco: Never  Vaping Use   Vaping Use: Never used  Substance and Sexual Activity   Alcohol use: No   Drug use: No   Sexual activity: Yes    Birth control/protection: Post-menopausal  Other Topics Concern   Not on file  Social History Narrative   Not on file   Social Determinants of Health   Financial Resource Strain: Low Risk    Difficulty of Paying Living Expenses: Not hard at all  Food Insecurity: No Food Insecurity   Worried About Charity fundraiser in the Last Year: Never true   [place] in the Last Year: Never true  Transportation Needs: No Transportation Needs   Lack of Transportation (Medical): No   Lack of Transportation (Non-Medical):  No  Physical Activity: Insufficiently Active   Days of Exercise per Week: 2 days   Minutes of Exercise per Session: 50 min  Stress: No Stress Concern Present   Feeling of Stress : Not at all  Social Connections: Socially Integrated   Frequency of Communication with Friends and Family: More than three times a week   Frequency of Social Gatherings with Friends and Family: More than three times a week   Attends Religious Services: More than 4 times per year   Active Member of [employer] or Organizations: Yes   Attends Archivist Meetings: 1 to 4 times per year   Marital Status: Married  Human resources officer Violence: Not At Risk   Fear of Current or Ex-Partner: No   Emotionally Abused: No   Physically Abused: No   Sexually Abused: No    FAMILY HISTORY:  Family History  Problem Relation Age of Onset   Heart attack Mother    Stroke Mother  Aneurysm Father    Colon cancer Neg Hx     CURRENT MEDICATIONS:  Current Outpatient Medications  Medication Sig Dispense Refill   Ascorbic Acid (VITAMIN C) 1000 MG tablet Take 1,000 mg by mouth daily.     augmented betamethasone dipropionate (DIPROLENE-AF) 0.05 % cream Apply topically as needed.     Cholecalciferol (VITAMIN D3) 50 MCG (2000 UT) TABS Take 6,000 Units by mouth daily.     fluconazole (DIFLUCAN) 150 MG tablet Take one tablet by mouth, repeat in 72 hours for total of 2 doses. 2 tablet 0   fluticasone (CUTIVATE) 0.05 % cream Apply 1 application topically 2 (two) times daily as needed (lichen planus).     fluticasone (FLONASE) 50 MCG/ACT nasal spray Place 1 spray into both nostrils daily as needed for allergies or rhinitis.     hydrocortisone (ANUSOL-HC) 2.5 % rectal cream Place 1 application rectally 2 (two) times daily. For 14 days. 30 g 0   Magnesium 250 MG TABS Take 1 tablet by mouth daily.     milk thistle 175 MG tablet Take 1,752 mg by mouth daily.     Multiple Vitamins-Minerals (HAIR/SKIN/NAILS/BIOTIN PO) Take 1 tablet by  mouth daily.     naproxen sodium (ALEVE) 220 MG tablet Take 220 mg by mouth 2 (two) times daily as needed (pain.).     OLIVE LEAF EXTRACT PO Take 1 tablet by mouth as needed.      pantoprazole (PROTONIX) 40 MG tablet Take 40 mg by mouth daily. As needed     Probiotic CAPS Take 1 capsule by mouth daily.      Quercetin 250 MG TABS Take 500 mg by mouth.      VITAMIN A PO Take 1 tablet by mouth daily.      vitamin B-12 (CYANOCOBALAMIN) 500 MCG tablet Take 500 mcg by mouth daily.     zinc gluconate 50 MG tablet Take 50 mg by mouth daily.     No current facility-administered medications for this visit.    ALLERGIES:  No Known Allergies  PHYSICAL EXAM:  Performance status (ECOG): 0 - Asymptomatic  There were no vitals filed for this visit. Wt Readings from Last 3 Encounters:  01/29/21 154 lb 6.4 oz (70 kg)  08/12/20 152 lb 9.6 oz (69.2 kg)  07/22/20 153 lb (69.4 kg)   Physical Exam Vitals reviewed.  Constitutional:      Appearance: Normal appearance.  Cardiovascular:     Rate and Rhythm: Normal rate and regular rhythm.     Pulses: Normal pulses.     Heart sounds: Normal heart sounds.  Pulmonary:     Effort: Pulmonary effort is normal.     Breath sounds: Normal breath sounds.  Abdominal:     Palpations: Abdomen is soft. There is no hepatomegaly, splenomegaly or mass.     Tenderness: There is no abdominal tenderness.  Lymphadenopathy:     Upper Body:     Right upper body: No supraclavicular adenopathy.     Left upper body: No supraclavicular adenopathy.  Neurological:     General: No focal deficit present.     Mental Status: She is alert and oriented to person, place, and time.  Psychiatric:        Mood and Affect: Mood normal.        Behavior: Behavior normal.     LABORATORY DATA:  I have reviewed the labs as listed.  CBC Latest Ref Rng & Units 01/22/2021 07/17/2020 01/15/2020  WBC 4.0 - 10.5  K/uL 4.5 4.2 4.0  Hemoglobin 12.0 - 15.0 g/dL 13.1 13.5 13.0  Hematocrit 36.0 -  46.0 % 39.2 41.6 39.7  Platelets 150 - 400 K/uL 153 131(L) 163   CMP Latest Ref Rng & Units 01/22/2021 07/17/2020 01/15/2020  Glucose 70 - 99 mg/dL 95 109(H) 93  BUN 8 - 23 mg/dL $Remove'11 14 17  'PVrBGNf$ Creatinine 0.44 - 1.00 mg/dL 0.68 0.75 0.76  Sodium 135 - 145 mmol/L 138 141 142  Potassium 3.5 - 5.1 mmol/L 3.7 3.9 3.7  Chloride 98 - 111 mmol/L 106 105 108  CO2 22 - 32 mmol/L $RemoveB'25 26 25  'HyGNBtOE$ Calcium 8.9 - 10.3 mg/dL 9.0 9.5 9.6  Total Protein 6.5 - 8.1 g/dL 7.6 7.8 7.6  Total Bilirubin 0.3 - 1.2 mg/dL 0.6 0.7 0.7  Alkaline Phos 38 - 126 U/L 82 80 66  AST 15 - 41 U/L 25 25 32  ALT 0 - 44 U/L $Remo'23 26 28    'KtkVJ$ DIAGNOSTIC IMAGING:  I have independently reviewed the scans and discussed with the patient. No results Long.   ASSESSMENT:  1.  Stage II (T2N0) hepatic flexure colon adenocarcinoma: -Status post right colon segmental resection on 06/15/2017, 3.2 cm tumor, 0/12 lymph nodes positive.  MMR normal/MSI low. -CEA a month after surgery was elevated at 6.3.  CT of the chest did not show any metastatic disease. -PET/CT scan on 08/16/2017 did not show any evidence of recurrence or metastatic disease.  Her CEA elevation is likely from her liver disease.  She has a history of hepatitis C and was treated for it. -CT scan of the abdomen pelvis on 02/23/2018 showed postoperative changes of partial right colectomy without definite evidence of local or metastatic disease.  There is mild narrowing in the region of hepatic flexure, presumably at surgical anastomosis. -CEA on 06/01/2018 has increased to 8.9.  Prior CEA 3 months ago was 7.7. -PET CT scan on 06/22/2018 showed no findings of recurrent or metastatic colon cancer.  Asymmetric palatine tonsillar activity, left greater than right. -CT of the abdomen pelvis on 01/11/2019 was negative for any recurrent or metastatic disease. -Patient had a colonoscopy on 01/18/2019 they Long 2 polyps that were negative for malignancy. -CEA slightly elevated at 8.0.  We will continue to  monitor this closely. -Labs done on 05/31/2019 showed CEA level 8.8. -CT AP done on 09/05/2019 showed no evidence of recurrent malignancy. -Last colonoscopy was on 01/18/2019.   2.  Hepatitis C: -Patient had a blood transfusion before 1974 was told she contracted hepatitis C. -She was treated 5 years ago for hepatitis C.   PLAN:  1.  Stage II (T2N0) hepatic flexure colon adenocarcinoma: - She does not report any change in bowel habits.  No bleeding per rectum or melena. - Physical examination was within normal limits.  CEA was 6.7, improved from 7.1. - Mildly elevated CEA is likely from liver disease from hep C.  LFTs are within normal limits. - Recommend follow-up in 6 months with repeat CTAP and CEA level.   2.  Iron deficiency state: - Hemoglobin is 13.1 with MCV 93.   3.  Hepatitis C: - She was treated for hepatitis C in the past.  LFTs are normal.   Orders placed this encounter:  No orders of the defined types were placed in this encounter.    Derek Jack, MD [place] [phone]   I, Thana Ates, am acting as a scribe for Dr. Derek Jack.  Kinnie Scales MD,  have reviewed the above documentation for accuracy and completeness, and I agree with the above.

## 2021-02-19 ENCOUNTER — Inpatient Hospital Stay (HOSPITAL_COMMUNITY): Payer: Medicare Other | Attending: Hematology | Admitting: Hematology

## 2021-02-19 ENCOUNTER — Other Ambulatory Visit: Payer: Self-pay

## 2021-02-19 VITALS — BP 119/80 | HR 63 | Temp 96.7°F | Resp 18 | Wt 151.0 lb

## 2021-02-19 DIAGNOSIS — Z780 Asymptomatic menopausal state: Secondary | ICD-10-CM | POA: Diagnosis not present

## 2021-02-19 DIAGNOSIS — Z85038 Personal history of other malignant neoplasm of large intestine: Secondary | ICD-10-CM | POA: Diagnosis not present

## 2021-02-19 DIAGNOSIS — R970 Elevated carcinoembryonic antigen [CEA]: Secondary | ICD-10-CM | POA: Insufficient documentation

## 2021-02-19 DIAGNOSIS — C183 Malignant neoplasm of hepatic flexure: Secondary | ICD-10-CM | POA: Diagnosis not present

## 2021-02-19 DIAGNOSIS — E611 Iron deficiency: Secondary | ICD-10-CM | POA: Diagnosis not present

## 2021-02-19 DIAGNOSIS — Z9049 Acquired absence of other specified parts of digestive tract: Secondary | ICD-10-CM | POA: Diagnosis not present

## 2021-02-19 DIAGNOSIS — B1920 Unspecified viral hepatitis C without hepatic coma: Secondary | ICD-10-CM | POA: Insufficient documentation

## 2021-02-19 NOTE — Patient Instructions (Signed)
[place] at [facility] Discharge Instructions  You were seen and examined today by Dr. Orvan Seen. Please follow up as scheduled.   Thank you for choosing [place] at [facility] to provide your oncology and hematology care.  To afford each patient quality time with our provider, please arrive at least 15 minutes before your scheduled appointment time.   If you have a lab appointment with the [place] please come in thru the Main Entrance and check in at the main information desk.  You need to re-schedule your appointment should you arrive 10 or more minutes late.  We strive to give you quality time with our providers, and arriving late affects you and other patients whose appointments are after yours.  Also, if you no show three or more times for appointments you may be dismissed from the clinic at the providers discretion.     Again, thank you for choosing [facility].  Our hope is that these requests will decrease the amount of time that you wait before being seen by our physicians.       _____________________________________________________________  Should you have questions after your visit to [facility], please contact our office at [phone] and follow the prompts.  Our office hours are 8:00 a.m. and 4:30 p.m. Monday - Friday.  Please note that voicemails left after 4:00 p.m. may not be returned until the following business day.  We are closed weekends and major holidays.  You do have access to a nurse 24-7, just call the main number to the clinic [phone] and do not press any options, hold on the line and a nurse will answer the phone.    For prescription refill requests, have your pharmacy contact our office and allow 72 hours.    Due to Covid, you will need to wear a mask upon entering the hospital. If you do not have a mask, a mask will be given to you at the Main Entrance upon arrival. For  doctor visits, patients may have 1 support person age 53 or older with them. For treatment visits, patients can not have anyone with them due to social distancing guidelines and our immunocompromised population.

## 2021-03-01 ENCOUNTER — Emergency Department (HOSPITAL_COMMUNITY): Payer: Medicare Other

## 2021-03-01 ENCOUNTER — Other Ambulatory Visit: Payer: Self-pay

## 2021-03-01 ENCOUNTER — Encounter (HOSPITAL_COMMUNITY): Payer: Self-pay | Admitting: Emergency Medicine

## 2021-03-01 ENCOUNTER — Emergency Department (HOSPITAL_COMMUNITY)
Admission: EM | Admit: 2021-03-01 | Discharge: 2021-03-02 | Disposition: A | Discharge status: Home / Self Care | Payer: Medicare Other | Attending: Emergency Medicine | Admitting: Emergency Medicine

## 2021-03-01 DIAGNOSIS — M542 Cervicalgia: Secondary | ICD-10-CM | POA: Insufficient documentation

## 2021-03-01 DIAGNOSIS — R0789 Other chest pain: Secondary | ICD-10-CM | POA: Insufficient documentation

## 2021-03-01 DIAGNOSIS — R002 Palpitations: Secondary | ICD-10-CM | POA: Insufficient documentation

## 2021-03-01 DIAGNOSIS — M79602 Pain in left arm: Secondary | ICD-10-CM | POA: Insufficient documentation

## 2021-03-01 DIAGNOSIS — Z5321 Procedure and treatment not carried out due to patient leaving prior to being seen by health care provider: Secondary | ICD-10-CM | POA: Insufficient documentation

## 2021-03-01 DIAGNOSIS — R079 Chest pain, unspecified: Secondary | ICD-10-CM | POA: Diagnosis not present

## 2021-03-01 LAB — COMPREHENSIVE METABOLIC PANEL
ALT: 21 U/L (ref 0–44)
AST: 34 U/L (ref 15–41)
Albumin: 3.9 g/dL (ref 3.5–5.0)
Alkaline Phosphatase: 75 U/L (ref 38–126)
Anion gap: 9 (ref 5–15)
BUN: 24 mg/dL — ABNORMAL HIGH (ref 8–23)
CO2: 23 mmol/L (ref 22–32)
Calcium: 9.5 mg/dL (ref 8.9–10.3)
Chloride: 108 mmol/L (ref 98–111)
Creatinine, Ser: 0.93 mg/dL (ref 0.44–1.00)
GFR, Estimated: >60 mL/min (ref >60)
Glucose, Bld: 99 mg/dL (ref 70–99)
Potassium: 4.2 mmol/L (ref 3.5–5.1)
Sodium: 140 mmol/L (ref 135–145)
Total Bilirubin: 0.9 mg/dL (ref 0.3–1.2)
Total Protein: 6.9 g/dL (ref 6.5–8.1)

## 2021-03-01 LAB — CBC WITH DIFFERENTIAL/PLATELET
Abs Immature Granulocytes: 0.01 10*3/uL (ref 0.00–0.07)
Basophils Absolute: 0.1 10*3/uL (ref 0.0–0.1)
Basophils Relative: 1 %
Eosinophils Absolute: 0.1 10*3/uL (ref 0.0–0.5)
Eosinophils Relative: 2 %
HCT: 40 % (ref 36.0–46.0)
Hemoglobin: 13.1 g/dL (ref 12.0–15.0)
Immature Granulocytes: 0 %
Lymphocytes Relative: 38 %
Lymphs Abs: 1.7 10*3/uL (ref 0.7–4.0)
MCH: 30.1 pg (ref 26.0–34.0)
MCHC: 32.8 g/dL (ref 30.0–36.0)
MCV: 92 fL (ref 80.0–100.0)
Monocytes Absolute: 0.5 10*3/uL (ref 0.1–1.0)
Monocytes Relative: 10 %
Neutro Abs: 2.1 10*3/uL (ref 1.7–7.7)
Neutrophils Relative %: 49 %
Platelets: 131 10*3/uL — ABNORMAL LOW (ref 150–400)
RBC: 4.35 MIL/uL (ref 3.87–5.11)
RDW: 12.9 % (ref 11.5–15.5)
WBC: 4.4 10*3/uL (ref 4.0–10.5)
nRBC: 0 % (ref 0.0–0.2)

## 2021-03-01 LAB — TROPONIN I (HIGH SENSITIVITY)
Troponin I (High Sensitivity): 4 ng/L (ref <18)
Troponin I (High Sensitivity): 4 ng/L (ref <18)

## 2021-03-01 LAB — LIPASE, BLOOD: Lipase: 62 U/L — ABNORMAL HIGH (ref 11–51)

## 2021-03-01 NOTE — ED Provider Notes (Signed)
Emergency Medicine Provider Triage Evaluation Note  Ariana Long , a 65 y.o. female  was evaluated in triage.  Pt complains of left sided arm and neck pain.  This started yesterday when she was upset with family.  Currently has the feeling in the arm.  Had palpitations last night.    Review of Systems  Positive: Left arm and left neck pain Negative: No nausea, diaphoresis, syncope/presyncope  Physical Exam  BP (!) 147/79 (BP Location: Right Arm)   Pulse 70   Temp 98.8 F (37.1 C)   Resp 16   SpO2 100%  Gen:   Awake, no distress   Resp:  Normal effort  MSK:   Moves extremities without difficulty  Other:  Normal speech.   Medical Decision Making  Medically screening exam initiated at 4:26 PM.  Appropriate orders placed.  Ariana Long was informed that the remainder of the evaluation will be completed by another provider, this initial triage assessment does not replace that evaluation, and the importance of remaining in the ED until their evaluation is complete.     Lorin Glass, PA-C 03/01/21 1628    Tegeler, Gwenyth Allegra, MD 03/01/21 2019

## 2021-03-01 NOTE — ED Triage Notes (Signed)
C/o soreness across chest since Wednesday that she related to carrying her grandchild.  Last night she started having palpitations and increased pain.  Denies SOB, nausea, and vomiting.  Denies pain at present but states she did have R sided chest pain prior to arrival.

## 2021-03-02 NOTE — ED Notes (Signed)
Pt stated she was leaving AMA 

## 2021-03-17 ENCOUNTER — Other Ambulatory Visit (HOSPITAL_COMMUNITY): Payer: Self-pay | Admitting: Family Medicine

## 2021-03-17 DIAGNOSIS — Z1231 Encounter for screening mammogram for malignant neoplasm of breast: Secondary | ICD-10-CM

## 2021-03-19 DIAGNOSIS — L438 Other lichen planus: Secondary | ICD-10-CM | POA: Diagnosis not present

## 2021-04-14 DIAGNOSIS — H04123 Dry eye syndrome of bilateral lacrimal glands: Secondary | ICD-10-CM | POA: Diagnosis not present

## 2021-04-30 ENCOUNTER — Ambulatory Visit (HOSPITAL_COMMUNITY)
Admission: RE | Admit: 2021-04-30 | Discharge: 2021-04-30 | Disposition: A | Discharge status: Home / Self Care | Payer: Medicare Other | Source: Ambulatory Visit | Attending: Family Medicine | Admitting: Family Medicine

## 2021-04-30 ENCOUNTER — Other Ambulatory Visit: Payer: Self-pay

## 2021-04-30 DIAGNOSIS — R7309 Other abnormal glucose: Secondary | ICD-10-CM | POA: Diagnosis not present

## 2021-04-30 DIAGNOSIS — E559 Vitamin D deficiency, unspecified: Secondary | ICD-10-CM | POA: Diagnosis not present

## 2021-04-30 DIAGNOSIS — Z1331 Encounter for screening for depression: Secondary | ICD-10-CM | POA: Diagnosis not present

## 2021-04-30 DIAGNOSIS — Z1231 Encounter for screening mammogram for malignant neoplasm of breast: Secondary | ICD-10-CM

## 2021-04-30 DIAGNOSIS — M1991 Primary osteoarthritis, unspecified site: Secondary | ICD-10-CM | POA: Diagnosis not present

## 2021-04-30 DIAGNOSIS — Z0000 Encounter for general adult medical examination without abnormal findings: Secondary | ICD-10-CM | POA: Diagnosis not present

## 2021-04-30 DIAGNOSIS — E782 Mixed hyperlipidemia: Secondary | ICD-10-CM | POA: Diagnosis not present

## 2021-04-30 DIAGNOSIS — D509 Iron deficiency anemia, unspecified: Secondary | ICD-10-CM | POA: Diagnosis not present

## 2021-04-30 DIAGNOSIS — E663 Overweight: Secondary | ICD-10-CM | POA: Diagnosis not present

## 2021-04-30 DIAGNOSIS — Z6823 Body mass index (BMI) 23.0-23.9, adult: Secondary | ICD-10-CM | POA: Diagnosis not present

## 2021-05-25 ENCOUNTER — Emergency Department (HOSPITAL_COMMUNITY)
Admission: EM | Admit: 2021-05-25 | Discharge: 2021-05-25 | Disposition: A | Discharge status: Home / Self Care | Payer: Medicare Other | Attending: Emergency Medicine | Admitting: Emergency Medicine

## 2021-05-25 ENCOUNTER — Emergency Department (HOSPITAL_COMMUNITY): Payer: Medicare Other

## 2021-05-25 ENCOUNTER — Encounter (HOSPITAL_COMMUNITY): Payer: Self-pay | Admitting: Emergency Medicine

## 2021-05-25 DIAGNOSIS — N23 Unspecified renal colic: Secondary | ICD-10-CM | POA: Diagnosis not present

## 2021-05-25 DIAGNOSIS — N201 Calculus of ureter: Secondary | ICD-10-CM | POA: Diagnosis not present

## 2021-05-25 DIAGNOSIS — R9431 Abnormal electrocardiogram [ECG] [EKG]: Secondary | ICD-10-CM | POA: Diagnosis not present

## 2021-05-25 DIAGNOSIS — R748 Abnormal levels of other serum enzymes: Secondary | ICD-10-CM | POA: Insufficient documentation

## 2021-05-25 DIAGNOSIS — R109 Unspecified abdominal pain: Secondary | ICD-10-CM | POA: Diagnosis not present

## 2021-05-25 LAB — URINALYSIS, ROUTINE W REFLEX MICROSCOPIC
Bacteria, UA: NONE SEEN
Bilirubin Urine: NEGATIVE
Glucose, UA: NEGATIVE mg/dL
Ketones, ur: 5 mg/dL — AB
Leukocytes,Ua: NEGATIVE
Nitrite: NEGATIVE
Protein, ur: NEGATIVE mg/dL
RBC / HPF: >50 RBC/hpf — ABNORMAL HIGH (ref 0–5)
Specific Gravity, Urine: 1.033 — ABNORMAL HIGH (ref 1.005–1.030)
pH: 6 (ref 5.0–8.0)

## 2021-05-25 LAB — COMPREHENSIVE METABOLIC PANEL
ALT: 24 U/L (ref 0–44)
AST: 31 U/L (ref 15–41)
Albumin: 4 g/dL (ref 3.5–5.0)
Alkaline Phosphatase: 71 U/L (ref 38–126)
Anion gap: 9 (ref 5–15)
BUN: 16 mg/dL (ref 8–23)
CO2: 24 mmol/L (ref 22–32)
Calcium: 8.8 mg/dL — ABNORMAL LOW (ref 8.9–10.3)
Chloride: 106 mmol/L (ref 98–111)
Creatinine, Ser: 0.84 mg/dL (ref 0.44–1.00)
GFR, Estimated: >60 mL/min (ref >60)
Glucose, Bld: 150 mg/dL — ABNORMAL HIGH (ref 70–99)
Potassium: 3.8 mmol/L (ref 3.5–5.1)
Sodium: 139 mmol/L (ref 135–145)
Total Bilirubin: 0 mg/dL — ABNORMAL LOW (ref 0.3–1.2)
Total Protein: 7.2 g/dL (ref 6.5–8.1)

## 2021-05-25 LAB — LIPASE, BLOOD: Lipase: 55 U/L — ABNORMAL HIGH (ref 11–51)

## 2021-05-25 LAB — CBC
HCT: 38.8 % (ref 36.0–46.0)
Hemoglobin: 12.4 g/dL (ref 12.0–15.0)
MCH: 30 pg (ref 26.0–34.0)
MCHC: 32 g/dL (ref 30.0–36.0)
MCV: 93.9 fL (ref 80.0–100.0)
Platelets: 129 10*3/uL — ABNORMAL LOW (ref 150–400)
RBC: 4.13 MIL/uL (ref 3.87–5.11)
RDW: 12.8 % (ref 11.5–15.5)
WBC: 6.1 10*3/uL (ref 4.0–10.5)
nRBC: 0 % (ref 0.0–0.2)

## 2021-05-25 MED ORDER — MORPHINE SULFATE (PF) 4 MG/ML IV SOLN
4.0000 mg | Freq: Once | INTRAVENOUS | Status: AC
  Administered 2021-05-25: 4 mg via INTRAVENOUS
  Filled 2021-05-25: qty 1

## 2021-05-25 MED ORDER — IOHEXOL 300 MG/ML  SOLN
100.0000 mL | Freq: Once | INTRAMUSCULAR | Status: AC | PRN
  Administered 2021-05-25: 100 mL via INTRAVENOUS

## 2021-05-25 MED ORDER — PROCHLORPERAZINE EDISYLATE 10 MG/2ML IJ SOLN
10.0000 mg | Freq: Once | INTRAMUSCULAR | Status: AC
  Administered 2021-05-25: 10 mg via INTRAVENOUS
  Filled 2021-05-25: qty 2

## 2021-05-25 MED ORDER — OXYCODONE-ACETAMINOPHEN 5-325 MG PO TABS
1.0000 | ORAL_TABLET | Freq: Four times a day (QID) | ORAL | 0 refills | Status: DC | PRN
Start: 2021-05-25 — End: 2021-05-26

## 2021-05-25 MED ORDER — TAMSULOSIN HCL 0.4 MG PO CAPS: 0.4000 mg | ORAL_CAPSULE | Freq: Every day | ORAL | 0 refills | Status: DC

## 2021-05-25 MED ORDER — PROCHLORPERAZINE MALEATE 10 MG PO TABS: 10.0000 mg | ORAL_TABLET | Freq: Four times a day (QID) | ORAL | 0 refills | Status: DC | PRN

## 2021-05-25 MED ORDER — ONDANSETRON HCL 4 MG/2ML IJ SOLN
4.0000 mg | Freq: Once | INTRAMUSCULAR | Status: AC
  Administered 2021-05-25: 4 mg via INTRAVENOUS
  Filled 2021-05-25: qty 2

## 2021-05-25 MED ORDER — OXYCODONE-ACETAMINOPHEN 5-325 MG PO TABS
1.0000 | ORAL_TABLET | Freq: Once | ORAL | Status: AC
  Administered 2021-05-25: 1 via ORAL
  Filled 2021-05-25: qty 1

## 2021-05-25 MED ORDER — LACTATED RINGERS IV BOLUS
1000.0000 mL | Freq: Once | INTRAVENOUS | Status: AC
  Administered 2021-05-25: 1000 mL via INTRAVENOUS

## 2021-05-25 NOTE — ED Provider Notes (Signed)
[facility] EMERGENCY DEPARTMENT Provider Note   CSN: [id number] Arrival date & time: 05/25/21  0117     History  Chief Complaint  Patient presents with   Abdominal Pain    Ariana Long is a 66 y.o. female.  The history is provided by the patient.  Abdominal Pain She has history of hepatitis C, diverticulitis, colon cancer status post right hemicolectomy and comes in complaining of right mid abdominal pain which started about 2 hours ago.  Pain does not radiate.  She currently rates pain at 8/10.  There was some associated nausea and vomiting.  Pain did improve briefly following emesis.  She also had a bowel movement which gave slight, temporary relief of pain.  Pain generally feels better when she is walking, worse when she is laying down.  She has not taken anything for pain.  She denies any urinary difficulty.   Home Medications Prior to Admission medications   Medication Sig Start Date End Date Taking? Authorizing Provider  Ascorbic Acid (VITAMIN C) 1000 MG tablet Take 1,000 mg by mouth daily.    [provider]  augmented betamethasone dipropionate (DIPROLENE-AF) 0.05 % cream Apply topically as needed. 06/08/20   [provider]  Cholecalciferol (VITAMIN D3) 50 MCG (2000 UT) TABS Take 6,000 Units by mouth daily.    [provider]  fluconazole (DIFLUCAN) 150 MG tablet Take one tablet by mouth, repeat in 72 hours for total of 2 doses. 01/29/21   Mahala Menghini, PA-C  fluticasone (CUTIVATE) 0.05 % cream Apply 1 application topically 2 (two) times daily as needed (lichen planus). Patient not taking: Reported on 02/19/2021    [provider]  fluticasone (FLONASE) 50 MCG/ACT nasal spray Place 1 spray into both nostrils daily as needed for allergies or rhinitis. Patient not taking: Reported on 02/19/2021    [provider]  hydrocortisone (ANUSOL-HC) 2.5 % rectal cream Place 1 application rectally 2 (two) times daily. For 14 days. 01/29/21    Mahala Menghini, PA-C  Magnesium 250 MG TABS Take 1 tablet by mouth daily.    [provider]  milk thistle 175 MG tablet Take 1,752 mg by mouth daily.    [provider]  Multiple Vitamins-Minerals (HAIR/SKIN/NAILS/BIOTIN PO) Take 1 tablet by mouth daily.    [provider]  naproxen sodium (ALEVE) 220 MG tablet Take 220 mg by mouth 2 (two) times daily as needed (pain.). Patient not taking: Reported on 02/19/2021    [provider]  OLIVE LEAF EXTRACT PO Take 1 tablet by mouth as needed.  Patient not taking: Reported on 02/19/2021    [provider]  pantoprazole (PROTONIX) 40 MG tablet Take 40 mg by mouth daily. As needed 06/29/19   [provider]  Probiotic CAPS Take 1 capsule by mouth daily.     [provider]  Quercetin 250 MG TABS Take 500 mg by mouth.     [provider]  VITAMIN A PO Take 1 tablet by mouth daily.     [provider]  vitamin B-12 (CYANOCOBALAMIN) 500 MCG tablet Take 500 mcg by mouth daily.    [provider]  zinc gluconate 50 MG tablet Take 50 mg by mouth daily.    [provider]      Allergies    Patient has no known allergies.    Review of Systems   Review of Systems  Gastrointestinal:  Positive for abdominal pain.  All other systems reviewed and are  negative.  Physical Exam Updated Vital Signs BP (!) 144/86 (BP Location: Right Arm)    Pulse 65    Temp 97.9 F (36.6 C) (Oral)    Resp 17    Ht 5' 5.5" (1.664 m)    Wt 68 kg    SpO2 98%    BMI 24.58 kg/m  Physical Exam Vitals and nursing note reviewed.  67 year old female, resting comfortably and in no acute distress. Vital signs are significant for borderline elevated blood pressure. Oxygen saturation is 98%, which is normal. Head is normocephalic and atraumatic. PERRLA, EOMI. Oropharynx is clear. Neck is nontender and supple without adenopathy or JVD. Back is nontender and there is no CVA  tenderness. Lungs are clear without rales, wheezes, or rhonchi. Chest is nontender. Heart has regular rate and rhythm without murmur. Abdomen is soft, flat, with mild tenderness in the right mid and lower abdomen.  There is no rebound or guarding.  There is no right upper quadrant tenderness and there is no Murphy sign.  Peristalsis is hypoactive. Extremities have no cyanosis or edema, full range of motion is present. Skin is warm and dry without rash. Neurologic: Mental status is normal, cranial nerves are intact, moves all extremities equally.  ED Results / Procedures / Treatments   Labs (all labs ordered are listed, but only abnormal results are displayed) Labs Reviewed  CBC - Abnormal; Notable for the following components:      Result Value   Platelets 129 (*)    All other components within normal limits  LIPASE, BLOOD  COMPREHENSIVE METABOLIC PANEL  URINALYSIS, ROUTINE W REFLEX MICROSCOPIC    EKG EKG Interpretation  Date/Time:  Sunday May 25 2021 01:33:03 EST Ventricular Rate:  65 PR Interval:    QRS Duration: 185 QT Interval:  442 QTC Calculation: 460 R Axis:   50 Text Interpretation: Normal sinus rhythm Nonspecific intraventricular conduction delay Abnormal T, consider ischemia, lateral leads When compared with ECG of 03/01/2021, No significant change was found Confirmed by Delora Fuel ([id number]) on 05/25/2021 1:47:46 AM  Radiology No results found.  Procedures Procedures    Medications Ordered in ED Medications  lactated ringers bolus 1,000 mL (has no administration in time range)  morphine 4 MG/ML injection 4 mg (has no administration in time range)  ondansetron (ZOFRAN) injection 4 mg (has no administration in time range)    ED Course/ Medical Decision Making/ A&P                           Medical Decision Making Amount and/or Complexity of Data Reviewed Labs: ordered. Radiology: ordered.  Risk Prescription drug management.   Right-sided  abdominal pain inpatient to a status post right hemicolectomy.  Differential diagnosis is broad and includes, but is not limited to, small bowel obstruction, pancreatitis, cholecystitis, diverticulitis, renal colic, pyelonephritis.  Old records reviewed, and CT scans of abdomen and pelvis to show presence of gallstones.  However, her pain does seem to be away from the gallbladder.  We will check screening labs and send for CT of abdomen and pelvis.  In the meantime, she is given IV fluids, morphine, ondansetron.  She required additional morphine for pain control.  Labs showed minimal elevation of lipase which had been present previously and is not felt to be clinically significant.  Urinalysis is significant for greater than 50 RBCs per high-power field suggesting possibility of kidney stone.  CT scan confirmed acute obstructive uropathy on the  right due to 6-7 mm calculus in the right mid ureter.  I have independently viewed the images, and agree with the radiologist's interpretation.  She required an additional dose of morphine for pain control but pain is currently adequately controlled.  No other pathology identified.  I will work with her to get adequate pain control so that she can be discharged with urology follow-up.  There is a high likelihood this kidney stone will require some sort of urologic procedure to get it to pass.  Pain control has been good in the emergency department.  Decision is made to discharge.  She is discharged with prescriptions for tamsulosin, prochlorperazine, and oxycodone-acetaminophen.  She is referred to urology for follow-up.  Strict return precautions given.        Final Clinical Impression(s) / ED Diagnoses Final diagnoses:  Ureteral colic  Ureterolithiasis    Rx / DC Orders ED Discharge Orders          Ordered    tamsulosin (FLOMAX) 0.4 MG CAPS capsule  Daily        05/25/21 0618    oxyCODONE-acetaminophen (PERCOCET/ROXICET) 5-325 MG tablet  Every 6  hours PRN        05/25/21 0618    oxyCODONE-acetaminophen (PERCOCET/ROXICET) 5-325 MG tablet  Every 6 hours PRN        05/25/21 0618    prochlorperazine (COMPAZINE) 10 MG tablet  Every 6 hours PRN        05/25/21 9470              Delora Fuel, MD 96/28/36 [phone]

## 2021-05-25 NOTE — ED Triage Notes (Signed)
Pt c/o right sided abd pain for the past 2 hours tonight. Pt also c/o diarrhea.

## 2021-05-25 NOTE — ED Notes (Signed)
Patient transported to CT 

## 2021-05-25 NOTE — ED Notes (Signed)
Pt given water per request

## 2021-05-25 NOTE — Discharge Instructions (Signed)
Return if pain is not being adequately controlled, you are vomiting and unable to hold your medication down, or if you start running a fever.

## 2021-05-25 NOTE — ED Notes (Signed)
RLQ tenderness, pt says she has had diverticulitis in the past- pt reports recently eating nuts.

## 2021-05-25 NOTE — ED Notes (Signed)
Pt ambulatory to bathroom- pt vomiting and c/o pain- Dr Roxanne Mins made aware- new orders received.

## 2021-05-25 NOTE — ED Notes (Signed)
ED Provider at bedside. 

## 2021-05-26 ENCOUNTER — Ambulatory Visit (HOSPITAL_COMMUNITY)
Admission: RE | Admit: 2021-05-26 | Discharge: 2021-05-26 | Disposition: A | Discharge status: Home / Self Care | Payer: Medicare Other | Source: Ambulatory Visit | Attending: Urology | Admitting: Urology

## 2021-05-26 ENCOUNTER — Encounter: Payer: Self-pay | Admitting: Urology

## 2021-05-26 ENCOUNTER — Ambulatory Visit (INDEPENDENT_AMBULATORY_CARE_PROVIDER_SITE_OTHER): Payer: Medicare Other | Admitting: Urology

## 2021-05-26 ENCOUNTER — Encounter (HOSPITAL_COMMUNITY): Payer: Self-pay

## 2021-05-26 ENCOUNTER — Encounter (HOSPITAL_COMMUNITY)
Admission: RE | Admit: 2021-05-26 | Discharge: 2021-05-26 | Disposition: A | Discharge status: Home / Self Care | Payer: Medicare Other | Source: Ambulatory Visit | Attending: Urology | Admitting: Urology

## 2021-05-26 ENCOUNTER — Other Ambulatory Visit: Payer: Self-pay

## 2021-05-26 VITALS — BP 112/69 | HR 88

## 2021-05-26 DIAGNOSIS — N200 Calculus of kidney: Secondary | ICD-10-CM | POA: Diagnosis not present

## 2021-05-26 DIAGNOSIS — R109 Unspecified abdominal pain: Secondary | ICD-10-CM | POA: Diagnosis not present

## 2021-05-26 LAB — MICROSCOPIC EXAMINATION
Epithelial Cells (non renal): >10 /hpf — AB (ref 0–10)
RBC, Urine: >30 /hpf — AB (ref 0–2)
Renal Epithel, UA: NONE SEEN /hpf

## 2021-05-26 LAB — URINALYSIS, ROUTINE W REFLEX MICROSCOPIC
Bilirubin, UA: NEGATIVE
Glucose, UA: NEGATIVE
Leukocytes,UA: NEGATIVE
Nitrite, UA: NEGATIVE
Specific Gravity, UA: >1.03 — ABNORMAL HIGH (ref 1.005–1.030)
Urobilinogen, Ur: 0.2 mg/dL (ref 0.2–1.0)
pH, UA: 5 (ref 5.0–7.5)

## 2021-05-26 MED FILL — Oxycodone w/ Acetaminophen Tab 5-325 MG: ORAL | Qty: 6 | Status: AC

## 2021-05-26 NOTE — Progress Notes (Signed)
Urological Symptom Review  Patient is experiencing the following symptoms: Painful intercourse   Review of Systems  Gastrointestinal (upper)  : Negative for upper GI symptoms  Gastrointestinal (lower) : Negative for lower GI symptoms  Constitutional : Negative for symptoms  Skin: Negative for skin symptoms  Eyes: Negative for eye symptoms  Ear/Nose/Throat : Negative for Ear/Nose/Throat symptoms  Hematologic/Lymphatic: Negative for Hematologic/Lymphatic symptoms  Cardiovascular : Negative for cardiovascular symptoms  Respiratory : Negative for respiratory symptoms  Endocrine: Negative for endocrine symptoms  Musculoskeletal: Negative for musculoskeletal symptoms  Neurological: Negative for neurological symptoms  Psychologic: Negative for psychiatric symptoms

## 2021-05-26 NOTE — Progress Notes (Signed)
05/26/2021 11:24 AM   [name] [birth year] [id number]  Referring provider: Sharilyn Sites, MD [address] [place],  [place] [address]  nephrolithiasis   HPI: [name] is a [phone] here for evaluation of nephrolithiasis. She developed severe right flank pain yesterday and presented to the ER and was diagnosed with a 6-62mm right proximal ureteral calculus. She has had numerous stone events but has never required surgery/ESWL for her calculi. The pain is sharp, intermittent mild to moderate and nonraditing. NO nausea/vomiting. KUB from today shows 47mm calculus over the pelvis.    PMH: Past Medical History:  Diagnosis Date   Cirrhosis of liver ([place]) 01/26/2011   FIBROSIS on liver biopsy in 2007, U/S  03/31/2013= mild diffuse hepatic steatosis and /or hepatocellular disease without focal hepatic parenchymal abnormality. per Dr. Patsy [place] (2012) pt is immune to hep A and Hep B. per 12/06/13 note from Liver clinic-pt had MRI which showed cirrhosis 2015.   Diverticulitis of colon    Elevated AFP 01/26/2011   Gall stones    Hematuria 12/12/2014   Hemorrhoid 12/01/2012   Hemorrhoids    Hepatitis C 01/26/2011   2005 non responder, genotype 1, Gr 1-2, Stage 3 . Treated with Harvoni at the Liver clinic, responder   Hidradenitis    History of hiatal hernia    History of kidney stones    Hx: UTI (urinary tract infection)    Kidney stones 10/2011   Osteoporosis    Palpitations    Primary cancer of hepatic flexure of colon ([place]) 06/15/2017   Serrated adenoma of colon 11/2011   Splenomegaly 01/26/2011   Thrombocytopenia ([place]) 01/26/2011   Tubular adenoma    Vulval lesion 12/14/2013   Has kissing lesions thin and puffy and paler i color about 5-6- mm in diameter.Dr Glo Herring in to co examine will try temovate and recheck in 3 months    Surgical History: Past Surgical History:  Procedure Laterality Date   AXILLARY SURGERY Bilateral 1979   "glands removed"   BREAST BIOPSY Right 2011   BREAST  EXCISIONAL BIOPSY Bilateral 25 years ago   Benign, fatty tumors   CESAREAN SECTION     x 2   COLECTOMY  06/15/2017   LAPAROSCOPIC ASSISTED RIGHT COLECTOMY, ERAS PATHWAY/notes 06/15/2017   COLONOSCOPY  07/31/2005   Rourk-Normal rectum/Normal colonoscopy/Repeat screening colonoscopy ten years   COLONOSCOPY  11/16/2011   RMR: Friable anorectum-likely source of hematochezia/ LARGE 1.2x2.5CM SERRATED ADENOMA resected from ascending colon 7CM DISTAL TO ICV, hyperplastic polyp removed    COLONOSCOPY N/A 07/18/2012   Dr. Gala Romney- normal appearing rectal mucosa. no residual polpy tissue seen at tatoo location. remainder of colonic mucosa appeared entirely normal. bx = tubular adenoma   COLONOSCOPY N/A 08/29/2015   Dr. Gala Romney: single 12 mm polyp in ascending colon, 4 mm polyp ascending. Sessile serrated adenoma. 3 year surveillance   COLONOSCOPY N/A 05/19/2017   4X2 cm sessile lesion at hepatic flexdure, unable to be completely removed   COLONOSCOPY N/A 01/18/2019   Dr. Gala Romney: Several small polyps removed, hyperplastic.  Next colonoscopy in 5 years.   ESOPHAGEAL BANDING N/A 08/29/2015   Procedure: ESOPHAGEAL BANDING;  Surgeon: Daneil Dolin, MD;  [facility];  Service: Endoscopy;  Laterality: N/A;   ESOPHAGOGASTRODUODENOSCOPY N/A 08/29/2015   Dr. Gala Romney: non-critical Schatzki's ring, somewhat prominent small submucosal vessels without obvious varices. hyperplastic gastric polyp. 2 year surveillance   HEMORRHOID BANDING     LAPAROSCOPIC RIGHT COLECTOMY N/A 06/15/2017   Procedure: LAPAROSCOPIC ASSISTED  RIGHT COLECTOMY, ERAS PATHWAY;  Surgeon: Fanny Skates, MD;  Location: [facility];  Service: General;  Laterality: N/A;   POLYPECTOMY  05/19/2017   Procedure: POLYPECTOMY;  Surgeon: Daneil Dolin, MD;  Location: [facility];  Service: Endoscopy;;   POLYPECTOMY  01/18/2019   Procedure: POLYPECTOMY;  Surgeon: Daneil Dolin, MD;  [facility]: [facility];  Service: Endoscopy;;   SKIN LESION EXCISION       Home Medications:  Allergies as of 05/26/2021   No Known Allergies      Medication List        Accurate as of May 26, 2021 11:24 AM. If you have any questions, ask your nurse or doctor.          STOP taking these medications    naproxen sodium 220 MG tablet Commonly known as: ALEVE Stopped by: Nicolette Bang, MD   oxyCODONE-acetaminophen 5-325 MG tablet Commonly known as: PERCOCET/ROXICET Stopped by: Nicolette Bang, MD   tamsulosin 0.4 MG Caps capsule Commonly known as: FLOMAX Stopped by: Nicolette Bang, MD   vitamin B-12 500 MCG tablet Commonly known as: CYANOCOBALAMIN Stopped by: Nicolette Bang, MD       TAKE these medications    augmented betamethasone dipropionate 0.05 % cream Commonly known as: DIPROLENE-AF Apply topically as needed.   fluconazole 150 MG tablet Commonly known as: Diflucan Take one tablet by mouth, repeat in 72 hours for total of 2 doses.   fluticasone 0.05 % cream Commonly known as: CUTIVATE Apply 1 application topically 2 (two) times daily as needed (lichen planus).   fluticasone 50 MCG/ACT nasal spray Commonly known as: FLONASE Place 1 spray into both nostrils daily as needed for allergies or rhinitis.   HAIR/SKIN/NAILS/BIOTIN PO Take 1 tablet by mouth daily.   hydrocortisone 2.5 % rectal cream Commonly known as: ANUSOL-HC Place 1 application rectally 2 (two) times daily. For 14 days.   Magnesium 250 MG Tabs Take 1 tablet by mouth daily.   milk thistle 175 MG tablet Take 1,752 mg by mouth daily.   OLIVE LEAF EXTRACT PO Take 1 tablet by mouth as needed.   pantoprazole 40 MG tablet Commonly known as: PROTONIX Take 40 mg by mouth daily. As needed   Probiotic Caps Take 1 capsule by mouth daily.   prochlorperazine 10 MG tablet Commonly known as: COMPAZINE Take 1 tablet (10 mg total) by mouth every 6 (six) hours as needed for nausea or vomiting.   Quercetin 250 MG Tabs Take 500 mg by mouth.    VITAMIN A PO Take 1 tablet by mouth daily.   vitamin C 1000 MG tablet Take 1,000 mg by mouth daily.   Vitamin D3 50 MCG (2000 UT) Tabs Take 6,000 Units by mouth daily.   zinc gluconate 50 MG tablet Take 50 mg by mouth daily.        Allergies: No Known Allergies  Family History: Family History  Problem Relation Age of Onset   Heart attack Mother    Stroke Mother    Aneurysm Father    Colon cancer Neg Hx     Social History:  reports that she has never smoked. She has never used smokeless tobacco. She reports that she does not drink alcohol and does not use drugs.  ROS: All other review of systems were reviewed and are negative except what is noted above in HPI  Physical Exam: BP 112/69    Pulse 88   Constitutional:  Alert and oriented, No acute distress. HEENT:   AT, moist mucus membranes.  Trachea midline, no masses. Cardiovascular: No clubbing, cyanosis, or edema. Respiratory: Normal respiratory effort, no increased work of breathing. GI: Abdomen is soft, nontender, nondistended, no abdominal masses GU: No CVA tenderness.  Lymph: No cervical or inguinal lymphadenopathy. Skin: No rashes, bruises or suspicious lesions. Neurologic: Grossly intact, no focal deficits, moving all 4 extremities. Psychiatric: Normal mood and affect.  Laboratory Data: Lab Results  Component Value Date   WBC 6.1 05/25/2021   HGB 12.4 05/25/2021   HCT 38.8 05/25/2021   MCV 93.9 05/25/2021   PLT 129 (L) 05/25/2021    Lab Results  Component Value Date   CREATININE 0.84 05/25/2021    No results found for: PSA  No results found for: TESTOSTERONE  Lab Results  Component Value Date   HGBA1C 5.5 06/14/2017    Urinalysis    Component Value Date/Time   COLORURINE YELLOW 05/25/2021 0425   APPEARANCEUR CLEAR 05/25/2021 0425   APPEARANCEUR Clear 09/28/2017 1400   LABSPEC 1.033 (H) 05/25/2021 0425   PHURINE 6.0 05/25/2021 0425   GLUCOSEU NEGATIVE 05/25/2021 0425   HGBUR LARGE  (A) 05/25/2021 0425   BILIRUBINUR NEGATIVE 05/25/2021 0425   BILIRUBINUR Negative 09/28/2017 1400   KETONESUR 5 (A) 05/25/2021 0425   PROTEINUR NEGATIVE 05/25/2021 0425   UROBILINOGEN 0.2 10/17/2013 0617   NITRITE NEGATIVE 05/25/2021 0425   LEUKOCYTESUR NEGATIVE 05/25/2021 0425    Lab Results  Component Value Date   LABMICR Comment 09/28/2017   WBCUA 6-10 (A) 01/27/2017   RBCUA 11-30 (A) 01/27/2017   LABEPIT 0-10 01/27/2017   MUCUS Present 01/27/2017   BACTERIA NONE SEEN 05/25/2021    Pertinent Imaging: CT yesterday and KUB today: Images reviewed and discussed with the patient  No results found for this or any previous visit.  No results found for this or any previous visit.  No results found for this or any previous visit.  No results found for this or any previous visit.  No results found for this or any previous visit.  No results found for this or any previous visit.  No results found for this or any previous visit.  No results found for this or any previous visit.   Assessment & Plan:    1. Kidney stones -We discussed the management of kidney stones. These options include observation, ureteroscopy, shockwave lithotripsy (ESWL) and percutaneous nephrolithotomy (PCNL). We discussed which options are relevant to the patient's stone(s). We discussed the natural history of kidney stones as well as the complications of untreated stones and the impact on quality of life without treatment as well as with each of the above listed treatments. We also discussed the efficacy of each treatment in its ability to clear the stone burden. With any of these management options I discussed the signs and symptoms of infection and the need for emergent treatment should these be experienced. For each option we discussed the ability of each procedure to clear the patient of their stone burden.   For observation I described the risks which include but are not limited to silent renal damage,  life-threatening infection, need for emergent surgery, failure to pass stone and pain.   For ureteroscopy I described the risks which include bleeding, infection, damage to contiguous structures, positioning injury, ureteral stricture, ureteral avulsion, ureteral injury, need for prolonged ureteral stent, inability to perform ureteroscopy, need for an interval procedure, inability to clear stone burden, stent discomfort/pain, heart attack, stroke, pulmonary embolus and the inherent risks with general anesthesia.  For shockwave lithotripsy I described the risks which include arrhythmia, kidney contusion, kidney hemorrhage, need for transfusion, pain, inability to adequately break up stone, inability to pass stone fragments, Steinstrasse, infection associated with obstructing stones, need for alternate surgical procedure, need for repeat shockwave lithotripsy, MI, CVA, PE and the inherent risks with anesthesia/conscious sedation.   For PCNL I described the risks including positioning injury, pneumothorax, hydrothorax, need for chest tube, inability to clear stone burden, renal laceration, arterial venous fistula or malformation, need for embolization of kidney, loss of kidney or renal function, need for repeat procedure, need for prolonged nephrostomy tube, ureteral avulsion, MI, CVA, PE and the inherent risks of general anesthesia.   - The patient would like to proceed with Right ESWL - Urinalysis, Routine w reflex microscopic   No follow-ups on file.  Nicolette Bang, MD  [facility]: [facility] [place]

## 2021-05-26 NOTE — H&P (View-Only) (Signed)
05/26/2021 11:24 AM   Ariana Long February 13, 1956 [id number]  Referring provider: Sharilyn Sites, MD [address] [place],  [place] [address]  nephrolithiasis   HPI: [name] is a [phone] here for evaluation of nephrolithiasis. She developed severe right flank pain yesterday and presented to the ER and was diagnosed with a 6-65mm right proximal ureteral calculus. She has had numerous stone events but has never required surgery/ESWL for her calculi. The pain is sharp, intermittent mild to moderate and nonraditing. NO nausea/vomiting. KUB from today shows 32mm calculus over the pelvis.    PMH: Past Medical History:  Diagnosis Date   Cirrhosis of liver ([place]) 01/26/2011   FIBROSIS on liver biopsy in 2007, U/S  03/31/2013= mild diffuse hepatic steatosis and /or hepatocellular disease without focal hepatic parenchymal abnormality. per Dr. Patsy [place] (2012) pt is immune to hep A and Hep B. per 12/06/13 note from Liver clinic-pt had MRI which showed cirrhosis 2015.   Diverticulitis of colon    Elevated AFP 01/26/2011   Gall stones    Hematuria 12/12/2014   Hemorrhoid 12/01/2012   Hemorrhoids    Hepatitis C 01/26/2011   2005 non responder, genotype 1, Gr 1-2, Stage 3 . Treated with Harvoni at the Liver clinic, responder   Hidradenitis    History of hiatal hernia    History of kidney stones    Hx: UTI (urinary tract infection)    Kidney stones 10/2011   Osteoporosis    Palpitations    Primary cancer of hepatic flexure of colon ([place]) 06/15/2017   Serrated adenoma of colon 11/2011   Splenomegaly 01/26/2011   Thrombocytopenia ([facility]) 01/26/2011   Tubular adenoma    Vulval lesion 12/14/2013   Has kissing lesions thin and puffy and paler i color about 5-6- mm in diameter.Dr Glo Herring in to co examine will try temovate and recheck in 3 months    Surgical History: Past Surgical History:  Procedure Laterality Date   AXILLARY SURGERY Bilateral 1979   "glands removed"   BREAST BIOPSY Right 2011   BREAST  EXCISIONAL BIOPSY Bilateral 25 years ago   Benign, fatty tumors   CESAREAN SECTION     x 2   COLECTOMY  06/15/2017   LAPAROSCOPIC ASSISTED RIGHT COLECTOMY, ERAS PATHWAY/notes 06/15/2017   COLONOSCOPY  07/31/2005   Rourk-Normal rectum/Normal colonoscopy/Repeat screening colonoscopy ten years   COLONOSCOPY  11/16/2011   RMR: Friable anorectum-likely source of hematochezia/ LARGE 1.2x2.5CM SERRATED ADENOMA resected from ascending colon 7CM DISTAL TO ICV, hyperplastic polyp removed    COLONOSCOPY N/A 07/18/2012   Dr. Gala Romney- normal appearing rectal mucosa. no residual polpy tissue seen at tatoo location. remainder of colonic mucosa appeared entirely normal. bx = tubular adenoma   COLONOSCOPY N/A 08/29/2015   Dr. Gala Romney: single 12 mm polyp in ascending colon, 4 mm polyp ascending. Sessile serrated adenoma. 3 year surveillance   COLONOSCOPY N/A 05/19/2017   4X2 cm sessile lesion at hepatic flexdure, unable to be completely removed   COLONOSCOPY N/A 01/18/2019   Dr. Gala Romney: Several small polyps removed, hyperplastic.  Next colonoscopy in 5 years.   ESOPHAGEAL BANDING N/A 08/29/2015   Procedure: ESOPHAGEAL BANDING;  Surgeon: Daneil Dolin, MD;  [facility];  Service: Endoscopy;  Laterality: N/A;   ESOPHAGOGASTRODUODENOSCOPY N/A 08/29/2015   Dr. Gala Romney: non-critical Schatzki's ring, somewhat prominent small submucosal vessels without obvious varices. hyperplastic gastric polyp. 2 year surveillance   HEMORRHOID BANDING     LAPAROSCOPIC RIGHT COLECTOMY N/A 06/15/2017   Procedure: LAPAROSCOPIC ASSISTED  RIGHT COLECTOMY, ERAS PATHWAY;  Surgeon: Fanny Skates, MD;  Location: [facility];  Service: General;  Laterality: N/A;   POLYPECTOMY  05/19/2017   Procedure: POLYPECTOMY;  Surgeon: Daneil Dolin, MD;  [facility];  Service: Endoscopy;;   POLYPECTOMY  01/18/2019   Procedure: POLYPECTOMY;  Surgeon: Daneil Dolin, MD;  [facility];  Service: Endoscopy;;   SKIN LESION EXCISION       Home Medications:  Allergies as of 05/26/2021   No Known Allergies      Medication List        Accurate as of May 26, 2021 11:24 AM. If you have any questions, ask your nurse or doctor.          STOP taking these medications    naproxen sodium 220 MG tablet Commonly known as: ALEVE Stopped by: Nicolette Bang, MD   oxyCODONE-acetaminophen 5-325 MG tablet Commonly known as: PERCOCET/ROXICET Stopped by: Nicolette Bang, MD   tamsulosin 0.4 MG Caps capsule Commonly known as: FLOMAX Stopped by: Nicolette Bang, MD   vitamin B-12 500 MCG tablet Commonly known as: CYANOCOBALAMIN Stopped by: Nicolette Bang, MD       TAKE these medications    augmented betamethasone dipropionate 0.05 % cream Commonly known as: DIPROLENE-AF Apply topically as needed.   fluconazole 150 MG tablet Commonly known as: Diflucan Take one tablet by mouth, repeat in 72 hours for total of 2 doses.   fluticasone 0.05 % cream Commonly known as: CUTIVATE Apply 1 application topically 2 (two) times daily as needed (lichen planus).   fluticasone 50 MCG/ACT nasal spray Commonly known as: FLONASE Place 1 spray into both nostrils daily as needed for allergies or rhinitis.   HAIR/SKIN/NAILS/BIOTIN PO Take 1 tablet by mouth daily.   hydrocortisone 2.5 % rectal cream Commonly known as: ANUSOL-HC Place 1 application rectally 2 (two) times daily. For 14 days.   Magnesium 250 MG Tabs Take 1 tablet by mouth daily.   milk thistle 175 MG tablet Take 1,752 mg by mouth daily.   OLIVE LEAF EXTRACT PO Take 1 tablet by mouth as needed.   pantoprazole 40 MG tablet Commonly known as: PROTONIX Take 40 mg by mouth daily. As needed   Probiotic Caps Take 1 capsule by mouth daily.   prochlorperazine 10 MG tablet Commonly known as: COMPAZINE Take 1 tablet (10 mg total) by mouth every 6 (six) hours as needed for nausea or vomiting.   Quercetin 250 MG Tabs Take 500 mg by mouth.    VITAMIN A PO Take 1 tablet by mouth daily.   vitamin C 1000 MG tablet Take 1,000 mg by mouth daily.   Vitamin D3 50 MCG (2000 UT) Tabs Take 6,000 Units by mouth daily.   zinc gluconate 50 MG tablet Take 50 mg by mouth daily.        Allergies: No Known Allergies  Family History: Family History  Problem Relation Age of Onset   Heart attack Mother    Stroke Mother    Aneurysm Father    Colon cancer Neg Hx     Social History:  reports that she has never smoked. She has never used smokeless tobacco. She reports that she does not drink alcohol and does not use drugs.  ROS: All other review of systems were reviewed and are negative except what is noted above in HPI  Physical Exam: BP 112/69    Pulse 88   Constitutional:  Alert and oriented, No acute distress. HEENT: [place]  AT, moist mucus membranes.  Trachea midline, no masses. Cardiovascular: No clubbing, cyanosis, or edema. Respiratory: Normal respiratory effort, no increased work of breathing. GI: Abdomen is soft, nontender, nondistended, no abdominal masses GU: No CVA tenderness.  Lymph: No cervical or inguinal lymphadenopathy. Skin: No rashes, bruises or suspicious lesions. Neurologic: Grossly intact, no focal deficits, moving all 4 extremities. Psychiatric: Normal mood and affect.  Laboratory Data: Lab Results  Component Value Date   WBC 6.1 05/25/2021   HGB 12.4 05/25/2021   HCT 38.8 05/25/2021   MCV 93.9 05/25/2021   PLT 129 (L) 05/25/2021    Lab Results  Component Value Date   CREATININE 0.84 05/25/2021    No results found for: PSA  No results found for: TESTOSTERONE  Lab Results  Component Value Date   HGBA1C 5.5 06/14/2017    Urinalysis    Component Value Date/Time   COLORURINE YELLOW 05/25/2021 0425   APPEARANCEUR CLEAR 05/25/2021 0425   APPEARANCEUR Clear 09/28/2017 1400   LABSPEC 1.033 (H) 05/25/2021 0425   PHURINE 6.0 05/25/2021 0425   GLUCOSEU NEGATIVE 05/25/2021 0425   HGBUR LARGE  (A) 05/25/2021 0425   BILIRUBINUR NEGATIVE 05/25/2021 0425   BILIRUBINUR Negative 09/28/2017 1400   KETONESUR 5 (A) 05/25/2021 0425   PROTEINUR NEGATIVE 05/25/2021 0425   UROBILINOGEN 0.2 10/17/2013 0617   NITRITE NEGATIVE 05/25/2021 0425   LEUKOCYTESUR NEGATIVE 05/25/2021 0425    Lab Results  Component Value Date   LABMICR Comment 09/28/2017   WBCUA 6-10 (A) 01/27/2017   RBCUA 11-30 (A) 01/27/2017   LABEPIT 0-10 01/27/2017   MUCUS Present 01/27/2017   BACTERIA NONE SEEN 05/25/2021    Pertinent Imaging: CT yesterday and KUB today: Images reviewed and discussed with the patient  No results found for this or any previous visit.  No results found for this or any previous visit.  No results found for this or any previous visit.  No results found for this or any previous visit.  No results found for this or any previous visit.  No results found for this or any previous visit.  No results found for this or any previous visit.  No results found for this or any previous visit.   Assessment & Plan:    1. Kidney stones -We discussed the management of kidney stones. These options include observation, ureteroscopy, shockwave lithotripsy (ESWL) and percutaneous nephrolithotomy (PCNL). We discussed which options are relevant to the patient's stone(s). We discussed the natural history of kidney stones as well as the complications of untreated stones and the impact on quality of life without treatment as well as with each of the above listed treatments. We also discussed the efficacy of each treatment in its ability to clear the stone burden. With any of these management options I discussed the signs and symptoms of infection and the need for emergent treatment should these be experienced. For each option we discussed the ability of each procedure to clear the patient of their stone burden.   For observation I described the risks which include but are not limited to silent renal damage,  life-threatening infection, need for emergent surgery, failure to pass stone and pain.   For ureteroscopy I described the risks which include bleeding, infection, damage to contiguous structures, positioning injury, ureteral stricture, ureteral avulsion, ureteral injury, need for prolonged ureteral stent, inability to perform ureteroscopy, need for an interval procedure, inability to clear stone burden, stent discomfort/pain, heart attack, stroke, pulmonary embolus and the inherent risks with general anesthesia.  For shockwave lithotripsy I described the risks which include arrhythmia, kidney contusion, kidney hemorrhage, need for transfusion, pain, inability to adequately break up stone, inability to pass stone fragments, Steinstrasse, infection associated with obstructing stones, need for alternate surgical procedure, need for repeat shockwave lithotripsy, MI, CVA, PE and the inherent risks with anesthesia/conscious sedation.   For PCNL I described the risks including positioning injury, pneumothorax, hydrothorax, need for chest tube, inability to clear stone burden, renal laceration, arterial venous fistula or malformation, need for embolization of kidney, loss of kidney or renal function, need for repeat procedure, need for prolonged nephrostomy tube, ureteral avulsion, MI, CVA, PE and the inherent risks of general anesthesia.   - The patient would like to proceed with Right ESWL - Urinalysis, Routine w reflex microscopic   No follow-ups on file.  Nicolette Bang, MD  [facility] [place]

## 2021-05-26 NOTE — Patient Instructions (Signed)
Lithotripsy Lithotripsy is a treatment that can help break up kidney stones that are too large to pass on their own. This is a nonsurgical procedure that crushes a kidney stone with shock waves. These shock waves pass through your body and focus on the kidney stone. They cause the kidney stone to break up into smaller pieces while it is still in the urinary tract. The smaller pieces of stone can pass more easily out of your body in the urine. Tell a health care provider about: Any allergies you have. All medicines you are taking, including vitamins, herbs, eye drops, creams, and over-the-counter medicines. Any problems you or family members have had with anesthetic medicines. Any blood disorders you have. Any surgeries you have had. Any medical conditions you have. Whether you are pregnant or may be pregnant. What are the risks? Generally, this is a safe procedure. However, problems may occur, including: Infection. Bleeding from the kidney. Bruising of the kidney or skin. Scarring of the kidney, which can lead to: Increased blood pressure. Poor kidney function. Return (recurrence) of kidney stones. Damage to other structures or organs, such as the liver, colon, spleen, or pancreas. Blockage (obstruction) of the tube that carries urine from the kidney to the bladder (ureter). Failure of the kidney stone to break into pieces (fragments). What happens before the procedure? Staying hydrated Follow instructions from your health care provider about hydration, which may include: Up to 2 hours before the procedure - you may continue to drink clear liquids, such as water, clear fruit juice, black coffee, and plain tea. Eating and drinking restrictions Follow instructions from your health care provider about eating and drinking, which may include: 8 hours before the procedure - stop eating heavy meals or foods, such as meat, fried foods, or fatty foods. 6 hours before the procedure - stop eating  light meals or foods, such as toast or cereal. 6 hours before the procedure - stop drinking milk or drinks that contain milk. 2 hours before the procedure - stop drinking clear liquids. Medicines Ask your health care provider about: Changing or stopping your regular medicines. This is especially important if you are taking diabetes medicines or blood thinners. Taking medicines such as aspirin and ibuprofen. These medicines can thin your blood. Do not take these medicines unless your health care provider tells you to take them. Taking over-the-counter medicines, vitamins, herbs, and supplements. Tests You may have tests, such as: Blood tests. Urine tests. Imaging tests, such as a CT scan. General instructions Plan to have someone take you home from the hospital or clinic. If you will be going home right after the procedure, plan to have someone with you for 24 hours. Ask your health care provider what steps will be taken to help prevent infection. These may include washing skin with a germ-killing soap. What happens during the procedure?  An IV will be inserted into one of your veins. You will be given one or more of the following: A medicine to help you relax (sedative). A medicine to make you fall asleep (general anesthetic). A water-filled cushion may be placed behind your kidney or on your abdomen. In some cases, you may be placed in a tub of lukewarm water. Your body will be positioned in a way that makes it easy to target the kidney stone. An X-ray or ultrasound exam will be done to locate your stone. Shock waves will be aimed at the stone. If you are awake, you may feel a tapping sensation as  the shock waves pass through your body. A flexible tube with holes in it (stent) may be placed in the ureter. This will help keep urine flowing from the kidney if the fragments of the stone have been blocking the ureter. The procedure may vary among health care providers and hospitals. What  happens after the procedure? You may have an X-ray to see whether the procedure was able to break up the kidney stone and how much of the stone has passed. If large stone fragments remain after treatment, you may need to have a second procedure at a later time. Your blood pressure, heart rate, breathing rate, and blood oxygen level will be monitored until you leave the hospital or clinic. You may be given antibiotics or pain medicine as needed. If a stent was placed in your ureter during surgery, it may stay in place for a few weeks. You may need to strain your urine to collect pieces of the kidney stone for testing. You will need to drink plenty of water. If you were given a sedative during the procedure, it can affect you for several hours. Do not drive or operate machinery until your health care provider says that it is safe. Summary Lithotripsy is a treatment that can help break up kidney stones that are too large to pass on their own. Lithotripsy is a nonsurgical procedure that crushes a kidney stone with shock waves. Generally, this is a safe procedure. However, problems may occur, including damage to the kidney or other organs, infection, or obstruction of the tube that carries urine from the kidney to the bladder (ureter). You may have a stent placed in your ureter to help drain your urine. This stent may stay in place for a few weeks. After the procedure, you will need to drink plenty of water. You may be asked to strain your urine to collect pieces of the kidney stone for testing. This information is not intended to replace advice given to you by your health care provider. Make sure you discuss any questions you have with your health care provider. Document Revised: 01/31/2019 Document Reviewed: 02/01/2019 Elsevier Patient Education  [place].

## 2021-05-27 ENCOUNTER — Ambulatory Visit (HOSPITAL_COMMUNITY): Payer: Medicare Other

## 2021-05-27 ENCOUNTER — Encounter (HOSPITAL_COMMUNITY)
Admission: RE | Disposition: A | Discharge status: Home / Self Care | Payer: Self-pay | Source: Home / Self Care | Attending: Urology

## 2021-05-27 ENCOUNTER — Ambulatory Visit (HOSPITAL_COMMUNITY)
Admission: RE | Admit: 2021-05-27 | Discharge: 2021-05-27 | Disposition: A | Discharge status: Home / Self Care | Payer: Medicare Other | Attending: Urology | Admitting: Urology

## 2021-05-27 ENCOUNTER — Encounter (HOSPITAL_COMMUNITY): Payer: Self-pay | Admitting: Urology

## 2021-05-27 ENCOUNTER — Other Ambulatory Visit: Payer: Self-pay

## 2021-05-27 DIAGNOSIS — Q7649 Other congenital malformations of spine, not associated with scoliosis: Secondary | ICD-10-CM | POA: Diagnosis not present

## 2021-05-27 DIAGNOSIS — N201 Calculus of ureter: Secondary | ICD-10-CM | POA: Diagnosis not present

## 2021-05-27 DIAGNOSIS — M47816 Spondylosis without myelopathy or radiculopathy, lumbar region: Secondary | ICD-10-CM | POA: Diagnosis not present

## 2021-05-27 DIAGNOSIS — N200 Calculus of kidney: Secondary | ICD-10-CM | POA: Diagnosis not present

## 2021-05-27 DIAGNOSIS — Z87442 Personal history of urinary calculi: Secondary | ICD-10-CM | POA: Diagnosis not present

## 2021-05-27 DIAGNOSIS — N2889 Other specified disorders of kidney and ureter: Secondary | ICD-10-CM | POA: Diagnosis not present

## 2021-05-27 HISTORY — PX: EXTRACORPOREAL SHOCK WAVE LITHOTRIPSY: SHX1557

## 2021-05-27 SURGERY — LITHOTRIPSY, ESWL
Anesthesia: LOCAL | Laterality: Right

## 2021-05-27 MED ORDER — DIPHENHYDRAMINE HCL 25 MG PO CAPS
25.0000 mg | ORAL_CAPSULE | ORAL | Status: AC
  Administered 2021-05-27: 10:00:00 25 mg via ORAL
  Filled 2021-05-27: qty 1

## 2021-05-27 MED ORDER — OXYCODONE-ACETAMINOPHEN 5-325 MG PO TABS
1.0000 | ORAL_TABLET | ORAL | 0 refills | Status: DC | PRN
Start: 2021-05-27 — End: 2021-06-11

## 2021-05-27 MED ORDER — DIAZEPAM 5 MG PO TABS
10.0000 mg | ORAL_TABLET | Freq: Once | ORAL | Status: AC
  Administered 2021-05-27: 10:00:00 10 mg via ORAL
  Filled 2021-05-27: qty 2

## 2021-05-27 MED ORDER — TAMSULOSIN HCL 0.4 MG PO CAPS
0.4000 mg | ORAL_CAPSULE | Freq: Every day | ORAL | 0 refills | Status: DC
Start: 2021-05-27 — End: 2022-04-22

## 2021-05-27 MED ORDER — ONDANSETRON HCL 4 MG PO TABS: 4.0000 mg | ORAL_TABLET | Freq: Every day | ORAL | 1 refills | Status: DC | PRN

## 2021-05-27 MED ORDER — SODIUM CHLORIDE 0.9 % IV SOLN: INTRAVENOUS | Status: DC

## 2021-05-27 NOTE — Interval H&P Note (Signed)
History and Physical Interval Note:  05/27/2021 9:41 AM  Ariana Long  has presented today for surgery, with the diagnosis of right ureteral calculus.  The various methods of treatment have been discussed with the patient and family. After consideration of risks, benefits and other options for treatment, the patient has consented to  Procedure(s): EXTRACORPOREAL SHOCK WAVE LITHOTRIPSY (ESWL) (Right) as a surgical intervention.  The patient's history has been reviewed, patient examined, no change in status, stable for surgery.  I have reviewed the patient's chart and labs.  Questions were answered to the patient's satisfaction.     Nicolette Bang

## 2021-05-29 ENCOUNTER — Encounter (HOSPITAL_COMMUNITY): Payer: Self-pay | Admitting: Urology

## 2021-06-06 DIAGNOSIS — K7460 Unspecified cirrhosis of liver: Secondary | ICD-10-CM | POA: Diagnosis not present

## 2021-06-06 DIAGNOSIS — R197 Diarrhea, unspecified: Secondary | ICD-10-CM | POA: Diagnosis not present

## 2021-06-06 DIAGNOSIS — Z6823 Body mass index (BMI) 23.0-23.9, adult: Secondary | ICD-10-CM | POA: Diagnosis not present

## 2021-06-07 DIAGNOSIS — Z20822 Contact with and (suspected) exposure to covid-19: Secondary | ICD-10-CM | POA: Diagnosis not present

## 2021-06-11 ENCOUNTER — Ambulatory Visit (HOSPITAL_COMMUNITY)
Admission: RE | Admit: 2021-06-11 | Discharge: 2021-06-11 | Disposition: A | Discharge status: Home / Self Care | Payer: Medicare Other | Source: Ambulatory Visit | Attending: Urology | Admitting: Urology

## 2021-06-11 ENCOUNTER — Other Ambulatory Visit: Payer: Self-pay

## 2021-06-11 ENCOUNTER — Ambulatory Visit (INDEPENDENT_AMBULATORY_CARE_PROVIDER_SITE_OTHER): Payer: Medicare Other | Admitting: Physician Assistant

## 2021-06-11 VITALS — BP 108/71 | HR 73 | Wt 147.0 lb

## 2021-06-11 DIAGNOSIS — N200 Calculus of kidney: Secondary | ICD-10-CM | POA: Insufficient documentation

## 2021-06-11 DIAGNOSIS — Z9889 Other specified postprocedural states: Secondary | ICD-10-CM | POA: Diagnosis not present

## 2021-06-11 LAB — URINALYSIS, ROUTINE W REFLEX MICROSCOPIC
Bilirubin, UA: NEGATIVE
Glucose, UA: NEGATIVE
Ketones, UA: NEGATIVE
Leukocytes,UA: NEGATIVE
Nitrite, UA: NEGATIVE
Protein,UA: NEGATIVE
RBC, UA: NEGATIVE
Specific Gravity, UA: >1.03 — ABNORMAL HIGH (ref 1.005–1.030)
Urobilinogen, Ur: 0.2 mg/dL (ref 0.2–1.0)
pH, UA: 5.5 (ref 5.0–7.5)

## 2021-06-11 NOTE — Progress Notes (Signed)
Assessment: 1. Kidney stones     Plan: Stone prevention diet recommendations given and she will continue to increase her fluid intake avoiding caffeine, and sodas.  Follow-up in 6 weeks with renal ultrasound prior to appointment.  Chief Complaint: No chief complaint on file.   HPI: Ariana Long is a 66 y.o. female who presents for continued evaluation of Right sided ureteral stone. Pt underwent EWS L on 05/27/2021 and has been doing well since.  She did pass a stone which she brought with her.  Currently, she is having no pain and denies nausea or vomiting.  No gross hematuria. Urinalysis-clear KUB obtained prior to office visit and a previous calcification noted in the right distal ureter is no longer present.  Images personally reviewed during the office visit.  Portions of the above documentation were copied from a prior visit for review purposes only.  Allergies: No Known Allergies  PMH: Past Medical History:  Diagnosis Date   Cirrhosis of liver ([place]) 01/26/2011   FIBROSIS on liver biopsy in 2007, U/S  03/31/2013= mild diffuse hepatic steatosis and /or hepatocellular disease without focal hepatic parenchymal abnormality. per Dr. Patsy Baltimore (2012) pt is immune to hep A and Hep B. per 12/06/13 note from Liver clinic-pt had MRI which showed cirrhosis 2015.   Diverticulitis of colon    Elevated AFP 01/26/2011   Gall stones    Hematuria 12/12/2014   Hemorrhoid 12/01/2012   Hemorrhoids    Hepatitis C 01/26/2011   2005 non responder, genotype 1, Gr 1-2, Stage 3 . Treated with Harvoni at the Liver clinic, responder   Hidradenitis    History of hiatal hernia    History of kidney stones    Hx: UTI (urinary tract infection)    Kidney stones 10/2011   Osteoporosis    Palpitations    Primary cancer of hepatic flexure of colon ([place]) 06/15/2017   Serrated adenoma of colon 11/2011   Splenomegaly 01/26/2011   Thrombocytopenia ([place]) 01/26/2011   Tubular adenoma    Vulval lesion 12/14/2013   Has  kissing lesions thin and puffy and paler i color about 5-6- mm in diameter.Dr Glo Herring in to co examine will try temovate and recheck in 3 months    PSH: Past Surgical History:  Procedure Laterality Date   AXILLARY SURGERY Bilateral 1979   "glands removed"   BREAST BIOPSY Right 2011   BREAST EXCISIONAL BIOPSY Bilateral 25 years ago   Benign, fatty tumors   CESAREAN SECTION     x 2   COLECTOMY  06/15/2017   LAPAROSCOPIC ASSISTED RIGHT COLECTOMY, ERAS PATHWAY/notes 06/15/2017   COLONOSCOPY  07/31/2005   Rourk-Normal rectum/Normal colonoscopy/Repeat screening colonoscopy ten years   COLONOSCOPY  11/16/2011   RMR: Friable anorectum-likely source of hematochezia/ LARGE 1.2x2.5CM SERRATED ADENOMA resected from ascending colon 7CM DISTAL TO ICV, hyperplastic polyp removed    COLONOSCOPY N/A 07/18/2012   Dr. Gala Romney- normal appearing rectal mucosa. no residual polpy tissue seen at tatoo location. remainder of colonic mucosa appeared entirely normal. bx = tubular adenoma   COLONOSCOPY N/A 08/29/2015   Dr. Gala Romney: single 12 mm polyp in ascending colon, 4 mm polyp ascending. Sessile serrated adenoma. 3 year surveillance   COLONOSCOPY N/A 05/19/2017   4X2 cm sessile lesion at hepatic flexdure, unable to be completely removed   COLONOSCOPY N/A 01/18/2019   Dr. Gala Romney: Several small polyps removed, hyperplastic.  Next colonoscopy in 5 years.   ESOPHAGEAL BANDING N/A 08/29/2015   Procedure: ESOPHAGEAL BANDING;  Surgeon: Herbie Baltimore  Hilton Cork, MD;  [facility];  Service: Endoscopy;  Laterality: N/A;   ESOPHAGOGASTRODUODENOSCOPY N/A 08/29/2015   Dr. Gala Romney: non-critical Schatzki's ring, somewhat prominent small submucosal vessels without obvious varices. hyperplastic gastric polyp. 2 year surveillance   EXTRACORPOREAL SHOCK WAVE LITHOTRIPSY Right 05/27/2021   Procedure: EXTRACORPOREAL SHOCK WAVE LITHOTRIPSY (ESWL);  Surgeon: Cleon Gustin, MD;  Location: [facility];  Service: Urology;  Laterality: Right;    HEMORRHOID BANDING     LAPAROSCOPIC RIGHT COLECTOMY N/A 06/15/2017   Procedure: LAPAROSCOPIC ASSISTED RIGHT COLECTOMY, ERAS PATHWAY;  Surgeon: Fanny Skates, MD;  Location: [facility];  Service: General;  Laterality: N/A;   POLYPECTOMY  05/19/2017   Procedure: POLYPECTOMY;  Surgeon: Daneil Dolin, MD;  [facility];  Service: Endoscopy;;   POLYPECTOMY  01/18/2019   Procedure: POLYPECTOMY;  Surgeon: Daneil Dolin, MD;  [facility];  Service: Endoscopy;;   SKIN LESION EXCISION      SH: Social History   Tobacco Use   Smoking status: Never   Smokeless tobacco: Never  Vaping Use   Vaping Use: Never used  Substance Use Topics   Alcohol use: No   Drug use: No    ROS: Constitutional:  Negative for fever, chills, weight loss CV: Negative for chest pain, previous MI, hypertension Respiratory:  Negative for shortness of breath, wheezing, sleep apnea, frequent cough GI:  Negative for nausea, vomiting, bloody stool, GERD  PE: BP 108/71    Pulse 73    Wt 147 lb (66.7 kg)    BMI 24.09 kg/m  GENERAL APPEARANCE:  Well appearing, well developed, well nourished, NAD HEENT:  Atraumatic, normocephalic NECK:  Supple. Trachea midline ABDOMEN: Nondistended EXTREMITIES:  Moves all extremities well, without clubbing, cyanosis, or edema NEUROLOGIC:  Alert and oriented x 3, normal gait, CN II-XII grossly intact MENTAL STATUS:  appropriate BACK:  Non-tender to palpation, No CVAT SKIN:  Warm, dry, and intact   Results: Laboratory Data: Lab Results  Component Value Date   WBC 6.1 05/25/2021   HGB 12.4 05/25/2021   HCT 38.8 05/25/2021   MCV 93.9 05/25/2021   PLT 129 (L) 05/25/2021    Lab Results  Component Value Date   CREATININE 0.84 05/25/2021    No results found for: PSA  No results found for: TESTOSTERONE  Lab Results  Component Value Date   HGBA1C 5.5 06/14/2017    Urinalysis    Component Value Date/Time   COLORURINE YELLOW 05/25/2021 0425    APPEARANCEUR Clear 05/26/2021 1216   LABSPEC 1.033 (H) 05/25/2021 0425   PHURINE 6.0 05/25/2021 0425   GLUCOSEU Negative 05/26/2021 1216   HGBUR LARGE (A) 05/25/2021 0425   BILIRUBINUR Negative 05/26/2021 1216   KETONESUR 5 (A) 05/25/2021 0425   PROTEINUR 1+ (A) 05/26/2021 1216   PROTEINUR NEGATIVE 05/25/2021 0425   UROBILINOGEN 0.2 10/17/2013 0617   NITRITE Negative 05/26/2021 1216   NITRITE NEGATIVE 05/25/2021 0425   LEUKOCYTESUR Negative 05/26/2021 1216   LEUKOCYTESUR NEGATIVE 05/25/2021 0425    Lab Results  Component Value Date   LABMICR See below: 05/26/2021   WBCUA 0-5 05/26/2021   RBCUA 11-30 (A) 01/27/2017   LABEPIT >10 (A) 05/26/2021   MUCUS Present 05/26/2021   BACTERIA Moderate (A) 05/26/2021    Pertinent Imaging:  Results for orders placed during the hospital encounter of 05/27/21  DG Abd 1 View  Narrative CLINICAL DATA:  Ureteral stone  EXAM: ABDOMEN - 1 VIEW  COMPARISON:  Previous studies including CT done  on 05/25/2021  FINDINGS: Bowel gas pattern is nonspecific. No abnormal masses are seen. There is 5 mm calcific density overlying the sacrum on the right side. This calcification is more medial than usual for the course of right ureter. However, no definite similar calcification was seen in the radiographs done on 05/26/2021. Kidneys are partly obscured by bowel contents limiting evaluation for renal stones. Degenerative changes are noted in the lower lumbar spine. Last lumbar vertebra is transitional.  IMPRESSION: There is 5 mm calcific density overlying the right side of sacrum which may either suggest ureteral stone or incidental vascular calcification.   Electronically Signed By: Elmer Picker M.D. On: 05/27/2021 11:06  No results found for this or any previous visit.  No results found for this or any previous visit.  No results found for this or any previous visit.  No results found for this or any previous visit.  No  results found for this or any previous visit.  No results found for this or any previous visit.  No results found for this or any previous visit.  Results for orders placed or performed in visit on 06/11/21 (from the past 24 hour(s))  Urinalysis, Routine w reflex microscopic   Collection Time: 06/11/21 11:53 AM  Result Value Ref Range   Specific Gravity, UA >1.030 (H) 1.005 - 1.030   pH, UA 5.5 5.0 - 7.5   Color, UA Yellow Yellow   Appearance Ur Clear Clear   Leukocytes,UA Negative Negative   Protein,UA Negative Negative/Trace   Glucose, UA Negative Negative   Ketones, UA Negative Negative   RBC, UA Negative Negative   Bilirubin, UA Negative Negative   Urobilinogen, Ur 0.2 0.2 - 1.0 mg/dL   Nitrite, UA Negative Negative   Microscopic Examination Comment

## 2021-06-16 LAB — CALCULI, WITH PHOTOGRAPH (CLINICAL LAB)
Calcium Oxalate Monohydrate: 100 %
Weight Calculi: 80 mg

## 2021-06-17 ENCOUNTER — Telehealth: Payer: Self-pay

## 2021-06-17 NOTE — Telephone Encounter (Signed)
Patient called and left a voicemail advising she has some questions regarding her stone analysis.

## 2021-06-17 NOTE — Telephone Encounter (Signed)
Patient notified and materials left at front for patient.

## 2021-06-17 NOTE — Telephone Encounter (Signed)
Please advise on report.

## 2021-06-25 DIAGNOSIS — E7849 Other hyperlipidemia: Secondary | ICD-10-CM | POA: Diagnosis not present

## 2021-06-25 DIAGNOSIS — B182 Chronic viral hepatitis C: Secondary | ICD-10-CM | POA: Diagnosis not present

## 2021-06-25 DIAGNOSIS — D509 Iron deficiency anemia, unspecified: Secondary | ICD-10-CM | POA: Diagnosis not present

## 2021-06-25 DIAGNOSIS — R7309 Other abnormal glucose: Secondary | ICD-10-CM | POA: Diagnosis not present

## 2021-06-25 DIAGNOSIS — E559 Vitamin D deficiency, unspecified: Secondary | ICD-10-CM | POA: Diagnosis not present

## 2021-06-26 DIAGNOSIS — Z20822 Contact with and (suspected) exposure to covid-19: Secondary | ICD-10-CM | POA: Diagnosis not present

## 2021-07-02 DIAGNOSIS — Z20822 Contact with and (suspected) exposure to covid-19: Secondary | ICD-10-CM | POA: Diagnosis not present

## 2021-07-09 ENCOUNTER — Other Ambulatory Visit: Payer: Medicare Other

## 2021-07-09 ENCOUNTER — Encounter: Payer: Self-pay | Admitting: Adult Health

## 2021-07-09 ENCOUNTER — Other Ambulatory Visit: Payer: Self-pay

## 2021-07-09 ENCOUNTER — Ambulatory Visit (INDEPENDENT_AMBULATORY_CARE_PROVIDER_SITE_OTHER): Payer: Medicare Other | Admitting: Adult Health

## 2021-07-09 VITALS — BP 121/73 | HR 52 | Ht 65.5 in | Wt 147.0 lb

## 2021-07-09 DIAGNOSIS — R52 Pain, unspecified: Secondary | ICD-10-CM | POA: Insufficient documentation

## 2021-07-09 DIAGNOSIS — Z87442 Personal history of urinary calculi: Secondary | ICD-10-CM | POA: Diagnosis not present

## 2021-07-09 DIAGNOSIS — R399 Unspecified symptoms and signs involving the genitourinary system: Secondary | ICD-10-CM | POA: Diagnosis not present

## 2021-07-09 LAB — POCT URINALYSIS DIPSTICK OB
Blood, UA: NEGATIVE
Glucose, UA: NEGATIVE
Ketones, UA: NEGATIVE
Leukocytes, UA: NEGATIVE
Nitrite, UA: NEGATIVE
POC,PROTEIN,UA: NEGATIVE

## 2021-07-09 NOTE — Progress Notes (Signed)
?  Subjective:  ?  ? Patient ID: Ariana Long, female   DOB: 1955-08-24, 66 y.o.   MRN: [id number] ? ?HPI ?Ariana Long is a 66 year old black female, married, PM in complaining of achy/pain in hip area in am and gets better as day goes on, wonders if mattress, and ? UTI,urine has odor, has history of kidney stones. ?PCP is Dr Hilma Favors. ? ?Review of Systems ?Feels achy in am near hips ??UTI symptoms,urine has odor ?Reviewed past medical,surgical, social and family history. Reviewed medications and allergies.  ?   ?Objective:  ? Physical Exam ?BP 121/73 (BP Location: Left Arm, Patient Position: Sitting, Cuff Size: Normal)   Pulse (!) 52   Ht 5' 5.5" (1.664 m)   Wt 147 lb (66.7 kg)   BMI 24.09 kg/m?  urine dipstick is negative. ?Skin warm and dry.  Lungs: clear to ausculation bilaterally. Cardiovascular: regular rate and rhythm.  ?  No CVAT, bladder non tender, no pint tenderness over hip area where aches in am ? [place] - 07/09/21 1144   ? ?  ? Pregnancy Intention Screening  ? Does the patient want to become pregnant in the next year? No   ? Does the patient's partner want to become pregnant in the next year? No   ? Would the patient like to discuss contraceptive options today? No   ?  ? Contraception Wrap Up  ? Current Method No Method - Other Reason   Postmenopausal  ? End Method No Method - Other Reason   ? Contraception Counseling Provided No   ? ?  ?  ? ?  ?  ?Assessment:  ?   ?1. Symptoms of urinary tract infection ?Will send urine for UA C&S to rule out UTI ?- POC Urinalysis Dipstick OB ?- Urinalysis, Routine w reflex microscopic ?- Urine Culture ? ?2. Aches ?Will watch for now, she is going to change her mattress to see if that helps ?Has CT in April ? ?3. History of kidney stones ?Has renal US 07/16/21 ?   ?Plan:  ?   ?Follow up prn  ?   ?

## 2021-07-10 LAB — URINALYSIS, ROUTINE W REFLEX MICROSCOPIC
Bilirubin, UA: NEGATIVE
Glucose, UA: NEGATIVE
Ketones, UA: NEGATIVE
Leukocytes,UA: NEGATIVE
Nitrite, UA: NEGATIVE
Protein,UA: NEGATIVE
RBC, UA: NEGATIVE
Specific Gravity, UA: 1.023 (ref 1.005–1.030)
Urobilinogen, Ur: 0.2 mg/dL (ref 0.2–1.0)
pH, UA: 5.5 (ref 5.0–7.5)

## 2021-07-11 LAB — URINE CULTURE

## 2021-07-16 ENCOUNTER — Other Ambulatory Visit: Payer: Self-pay

## 2021-07-16 ENCOUNTER — Ambulatory Visit (HOSPITAL_COMMUNITY)
Admission: RE | Admit: 2021-07-16 | Discharge: 2021-07-16 | Disposition: A | Discharge status: Home / Self Care | Payer: Medicare Other | Source: Ambulatory Visit | Attending: Physician Assistant | Admitting: Physician Assistant

## 2021-07-16 DIAGNOSIS — N3289 Other specified disorders of bladder: Secondary | ICD-10-CM | POA: Diagnosis not present

## 2021-07-16 DIAGNOSIS — K760 Fatty (change of) liver, not elsewhere classified: Secondary | ICD-10-CM | POA: Diagnosis not present

## 2021-07-16 DIAGNOSIS — N200 Calculus of kidney: Secondary | ICD-10-CM | POA: Insufficient documentation

## 2021-07-23 ENCOUNTER — Ambulatory Visit (INDEPENDENT_AMBULATORY_CARE_PROVIDER_SITE_OTHER): Payer: Medicare Other | Admitting: Physician Assistant

## 2021-07-23 ENCOUNTER — Other Ambulatory Visit: Payer: Self-pay

## 2021-07-23 VITALS — BP 116/80 | HR 65 | Ht 65.0 in | Wt 146.0 lb

## 2021-07-23 DIAGNOSIS — N200 Calculus of kidney: Secondary | ICD-10-CM | POA: Diagnosis not present

## 2021-07-23 LAB — URINALYSIS, ROUTINE W REFLEX MICROSCOPIC
Bilirubin, UA: NEGATIVE
Glucose, UA: NEGATIVE
Ketones, UA: NEGATIVE
Nitrite, UA: NEGATIVE
Protein,UA: NEGATIVE
Specific Gravity, UA: >1.03 — ABNORMAL HIGH (ref 1.005–1.030)
Urobilinogen, Ur: 0.2 mg/dL (ref 0.2–1.0)
pH, UA: 5.5 (ref 5.0–7.5)

## 2021-07-23 LAB — MICROSCOPIC EXAMINATION: Renal Epithel, UA: NONE SEEN /hpf

## 2021-07-23 NOTE — Progress Notes (Signed)
? ?Assessment: ?1. Kidney stones   ? ? ?Plan: ?Follow-up in 6 months after renal ultrasound for urinalysis.  She will continue stone prevention diet recommendations.  She will follow-up as needed if she has new concerns or questions. ? ?Chief Complaint: ?No chief complaint on file. ? ? ?HPI: ?Ariana Long is a 66 y.o. female who presents for continued evaluation of nephrolithiasis, status post EWS L on 05/27/2021.  She states she is doing well and has no urinary complaints today.  No hematuria.  No pain.  She is diligently working to remain adequately hydrated and follow recommended stone prevention diet.  Renal ultrasound obtained last week reveals no evidence of hydronephrosis or obstruction.  ? ?UA = 0-5 WBCs, 0-2 RBCs, few bacteria, nitrite negative.  Within normal ranges. ? ?Portions of the above documentation were copied from a prior visit for review purposes only. ? ?Allergies: ?No Known Allergies ? ?PMH: ?Past Medical History:  ?Diagnosis Date  ? Cirrhosis of liver ([place]) 01/26/2011  ? FIBROSIS on liver biopsy in 2007, U/S  03/31/2013= mild diffuse hepatic steatosis and /or hepatocellular disease without focal hepatic parenchymal abnormality. per Dr. Patsy Baltimore (2012) pt is immune to hep A and Hep B. per 12/06/13 note from Liver clinic-pt had MRI which showed cirrhosis 2015.  ? Diverticulitis of colon   ? Elevated AFP 01/26/2011  ? Gall stones   ? Hematuria 12/12/2014  ? Hemorrhoid 12/01/2012  ? Hemorrhoids   ? Hepatitis C 01/26/2011  ? 2005 non responder, genotype 1, Gr 1-2, Stage 3 . Treated with Harvoni at the Liver clinic, responder  ? Hidradenitis   ? History of hiatal hernia   ? History of kidney stones   ? Hx: UTI (urinary tract infection)   ? Kidney stones 10/2011  ? Osteoporosis   ? Palpitations   ? Primary cancer of hepatic flexure of colon ([place]) 06/15/2017  ? Serrated adenoma of colon 11/2011  ? Splenomegaly 01/26/2011  ? Thrombocytopenia ([place]) 01/26/2011  ? Tubular adenoma   ? Vulval lesion 12/14/2013  ? Has  kissing lesions thin and puffy and paler i color about 5-6- mm in diameter.Dr Glo Herring in to co examine will try temovate and recheck in 3 months  ? ? ?PSH: ?Past Surgical History:  ?Procedure Laterality Date  ? AXILLARY SURGERY Bilateral 1979  ? "glands removed"  ? BREAST BIOPSY Right 2011  ? BREAST EXCISIONAL BIOPSY Bilateral 25 years ago  ? Benign, fatty tumors  ? CESAREAN SECTION    ? x 2  ? COLECTOMY  06/15/2017  ? LAPAROSCOPIC ASSISTED RIGHT COLECTOMY, ERAS PATHWAY/notes 06/15/2017  ? COLONOSCOPY  07/31/2005  ? Rourk-Normal rectum/Normal colonoscopy/Repeat screening colonoscopy ten years  ? COLONOSCOPY  11/16/2011  ? RMR: Friable anorectum-likely source of hematochezia/ LARGE 1.2x2.5CM SERRATED ADENOMA resected from ascending colon 7CM DISTAL TO ICV, hyperplastic polyp removed   ? COLONOSCOPY N/A 07/18/2012  ? Dr. Gala Romney- normal appearing rectal mucosa. no residual polpy tissue seen at tatoo location. remainder of colonic mucosa appeared entirely normal. bx = tubular adenoma  ? COLONOSCOPY N/A 08/29/2015  ? Dr. Gala Romney: single 12 mm polyp in ascending colon, 4 mm polyp ascending. Sessile serrated adenoma. 3 year surveillance  ? COLONOSCOPY N/A 05/19/2017  ? 4X2 cm sessile lesion at hepatic flexdure, unable to be completely removed  ? COLONOSCOPY N/A 01/18/2019  ? Dr. Gala Romney: Several small polyps removed, hyperplastic.  Next colonoscopy in 5 years.  ? ESOPHAGEAL BANDING N/A 08/29/2015  ? Procedure: ESOPHAGEAL BANDING;  Surgeon: Cristopher Estimable  Rourk, MD;  [facility];  Service: Endoscopy;  Laterality: N/A;  ? ESOPHAGOGASTRODUODENOSCOPY N/A 08/29/2015  ? Dr. Gala Romney: non-critical Schatzki's ring, somewhat prominent small submucosal vessels without obvious varices. hyperplastic gastric polyp. 2 year surveillance  ? EXTRACORPOREAL SHOCK WAVE LITHOTRIPSY Right 05/27/2021  ? Procedure: EXTRACORPOREAL SHOCK WAVE LITHOTRIPSY (ESWL);  Surgeon: Cleon Gustin, MD;  [facility];  Service: Urology;  Laterality: Right;  ?  HEMORRHOID BANDING    ? LAPAROSCOPIC RIGHT COLECTOMY N/A 06/15/2017  ? Procedure: LAPAROSCOPIC ASSISTED RIGHT COLECTOMY, ERAS PATHWAY;  Surgeon: Fanny Skates, MD;  Location: [facility];  Service: General;  Laterality: N/A;  ? POLYPECTOMY  05/19/2017  ? Procedure: POLYPECTOMY;  Surgeon: Daneil Dolin, MD;  [facility];  Service: Endoscopy;;  ? POLYPECTOMY  01/18/2019  ? Procedure: POLYPECTOMY;  Surgeon: Daneil Dolin, MD;  [facility];  Service: Endoscopy;;  ? SKIN LESION EXCISION    ? ? ?SH: ?Social History  ? ?Tobacco Use  ? Smoking status: Never  ? Smokeless tobacco: Never  ?Vaping Use  ? Vaping Use: Never used  ?Substance Use Topics  ? Alcohol use: No  ? Drug use: No  ? ? ?ROS: ?Constitutional:  Negative for fever, chills, weight loss ?CV: Negative for chest pain ?Respiratory:  Negative for shortness of breath, wheezing, sleep apnea, frequent cough ?GI:  Negative for nausea, vomiting, bloody stool, GERD ? ?PE: ?BP 116/80   Pulse 65   Ht '5\' 5"'$  (1.651 m)   Wt 146 lb (66.2 kg)   BMI 24.30 kg/m?  ?GENERAL APPEARANCE:  Well appearing, well developed, well nourished, NAD ?HEENT:  Atraumatic, normocephalic ?NECK:  Supple. Trachea midline ?ABDOMEN:  Soft, non-tender, no masses ?EXTREMITIES:  Moves all extremities well, without clubbing, cyanosis, or edema ?NEUROLOGIC:  Alert and oriented x 3, normal gait, CN II-XII grossly intact ?MENTAL STATUS:  appropriate ?BACK:  Non-tender to palpation, No CVAT ?SKIN:  Warm, dry, and intact ? ?Results: ?Laboratory Data: ?Lab Results  ?Component Value Date  ? WBC 6.1 05/25/2021  ? HGB 12.4 05/25/2021  ? HCT 38.8 05/25/2021  ? MCV 93.9 05/25/2021  ? PLT 129 (L) 05/25/2021  ? ? ?Lab Results  ?Component Value Date  ? CREATININE 0.84 05/25/2021  ? ? ?Lab Results  ?Component Value Date  ? HGBA1C 5.5 06/14/2017  ? ? ?Urinalysis ?   ?Component Value Date/Time  ? [place] YELLOW 05/25/2021 0425  ? APPEARANCEUR Clear 07/09/2021 1430  ? LABSPEC 1.033 (H) 05/25/2021  0425  ? PHURINE 6.0 05/25/2021 0425  ? GLUCOSEU Negative 07/09/2021 1430  ? GLUCOSEU Negative 07/09/2021 1145  ? HGBUR LARGE (A) 05/25/2021 0425  ? BILIRUBINUR Negative 07/09/2021 1430  ? KETONESUR 5 (A) 05/25/2021 0425  ? PROTEINUR Negative 07/09/2021 1430  ? [place] NEGATIVE 05/25/2021 0425  ? UROBILINOGEN 0.2 10/17/2013 0617  ? NITRITE Negative 07/09/2021 1430  ? NITRITE neg 07/09/2021 1145  ? NITRITE NEGATIVE 05/25/2021 0425  ? LEUKOCYTESUR Negative 07/09/2021 1430  ? LEUKOCYTESUR Negative 07/09/2021 1145  ? LEUKOCYTESUR NEGATIVE 05/25/2021 0425  ? ? ?Lab Results  ?Component Value Date  ? LABMICR Comment 07/09/2021  ? WBCUA 0-5 05/26/2021  ? RBCUA 11-30 (A) 01/27/2017  ? LABEPIT >10 (A) 05/26/2021  ? MUCUS Present 05/26/2021  ? BACTERIA Moderate (A) 05/26/2021  ? ? ?Pertinent Imaging: ?Results for orders placed during the hospital encounter of 06/11/21 ? ?DG Abd 1 View ? ?Narrative ?CLINICAL DATA:  Status post ESWL. ? ?EXAM: ?ABDOMEN - 1 VIEW ? ?  COMPARISON:  Radiograph dated 05/27/2021. ? ?FINDINGS: ?No radiopaque calculi noted over the renal silhouettes or along the ?course of the ureters. The previously described 5 mm radiopaque ?focus over the right sacrum is not visualized. ? ?Moderate stool throughout the colon. No bowel dilatation or evidence ?of obstruction. No free air. The osseous structures are intact. ? ?IMPRESSION: ?No radiopaque calculi noted. ? ? ?Electronically Signed ?By: Anner Crete M.D. ?On: 06/12/2021 11:15 ? ?No results found for this or any previous visit. ? ?No results found for this or any previous visit. ? ?No results found for this or any previous visit. ? ?Results for orders placed during the hospital encounter of 07/16/21 ? ?Ultrasound renal complete ? ?Narrative ?CLINICAL DATA:  nephrolithiasis post ESWL ? ?EXAM: ?RENAL / URINARY TRACT ULTRASOUND COMPLETE ? ?COMPARISON:  US Abdomen, most recently 12/11/2016. CT [facility], ?05/25/2021. ? ?FINDINGS: ?Suboptimal evaluation, secondary to  shadowing from overlying bowel ?gas. ? ?Right Kidney: ? ?Renal measurements: 9.3 x 4.3 x 4.5 cm = volume: 95 mL. Echogenicity ?within normal limits. No mass or hydronephrosis visualized. ? ?Left Kidney: ? ?

## 2021-07-26 DIAGNOSIS — Z20822 Contact with and (suspected) exposure to covid-19: Secondary | ICD-10-CM | POA: Diagnosis not present

## 2021-07-30 ENCOUNTER — Encounter: Payer: Self-pay | Admitting: Internal Medicine

## 2021-07-30 ENCOUNTER — Ambulatory Visit: Payer: Medicare Other | Admitting: Gastroenterology

## 2021-08-09 DIAGNOSIS — Z20822 Contact with and (suspected) exposure to covid-19: Secondary | ICD-10-CM | POA: Diagnosis not present

## 2021-08-11 DIAGNOSIS — Z20822 Contact with and (suspected) exposure to covid-19: Secondary | ICD-10-CM | POA: Diagnosis not present

## 2021-08-20 ENCOUNTER — Ambulatory Visit (HOSPITAL_COMMUNITY)
Admission: RE | Admit: 2021-08-20 | Discharge: 2021-08-20 | Disposition: A | Discharge status: Home / Self Care | Payer: Medicare Other | Source: Ambulatory Visit | Attending: Hematology | Admitting: Hematology

## 2021-08-20 ENCOUNTER — Inpatient Hospital Stay (HOSPITAL_COMMUNITY): Payer: Medicare Other | Attending: Hematology

## 2021-08-20 DIAGNOSIS — C183 Malignant neoplasm of hepatic flexure: Secondary | ICD-10-CM

## 2021-08-20 DIAGNOSIS — K8020 Calculus of gallbladder without cholecystitis without obstruction: Secondary | ICD-10-CM | POA: Diagnosis not present

## 2021-08-20 DIAGNOSIS — N200 Calculus of kidney: Secondary | ICD-10-CM | POA: Diagnosis not present

## 2021-08-20 DIAGNOSIS — I700 Atherosclerosis of aorta: Secondary | ICD-10-CM | POA: Diagnosis not present

## 2021-08-20 MED ORDER — IOHEXOL 300 MG/ML  SOLN
100.0000 mL | Freq: Once | INTRAMUSCULAR | Status: AC | PRN
  Administered 2021-08-20: 100 mL via INTRAVENOUS

## 2021-08-21 LAB — CEA: CEA: 6.7 ng/mL — ABNORMAL HIGH (ref 0.0–4.7)

## 2021-08-27 ENCOUNTER — Inpatient Hospital Stay (HOSPITAL_COMMUNITY): Payer: Medicare Other | Admitting: Hematology

## 2021-09-03 ENCOUNTER — Inpatient Hospital Stay (HOSPITAL_COMMUNITY): Payer: Medicare Other | Attending: Hematology | Admitting: Hematology

## 2021-09-03 ENCOUNTER — Other Ambulatory Visit (HOSPITAL_COMMUNITY): Payer: Self-pay | Admitting: *Deleted

## 2021-09-03 VITALS — BP 119/74 | HR 65 | Temp 98.3°F | Resp 16 | Ht 65.5 in | Wt 149.4 lb

## 2021-09-03 DIAGNOSIS — Z9049 Acquired absence of other specified parts of digestive tract: Secondary | ICD-10-CM | POA: Diagnosis not present

## 2021-09-03 DIAGNOSIS — Z8619 Personal history of other infectious and parasitic diseases: Secondary | ICD-10-CM | POA: Insufficient documentation

## 2021-09-03 DIAGNOSIS — R970 Elevated carcinoembryonic antigen [CEA]: Secondary | ICD-10-CM | POA: Insufficient documentation

## 2021-09-03 DIAGNOSIS — Z85038 Personal history of other malignant neoplasm of large intestine: Secondary | ICD-10-CM | POA: Diagnosis not present

## 2021-09-03 DIAGNOSIS — Z780 Asymptomatic menopausal state: Secondary | ICD-10-CM | POA: Insufficient documentation

## 2021-09-03 DIAGNOSIS — C183 Malignant neoplasm of hepatic flexure: Secondary | ICD-10-CM

## 2021-09-03 DIAGNOSIS — Z20822 Contact with and (suspected) exposure to covid-19: Secondary | ICD-10-CM | POA: Diagnosis not present

## 2021-09-03 DIAGNOSIS — Z862 Personal history of diseases of the blood and blood-forming organs and certain disorders involving the immune mechanism: Secondary | ICD-10-CM | POA: Diagnosis not present

## 2021-09-03 NOTE — Patient Instructions (Signed)
[place] at [facility] ?Discharge Instructions ? ? ?You were seen and examined today by Dr. Delton Coombes. ? ?He reviewed your labs and CT scan which are normal/stable.  ? ?Return as scheduled in 6 months.  ? ? ?Thank you for choosing [place] at [facility] to provide your oncology and hematology care.  To afford each patient quality time with our provider, please arrive at least 15 minutes before your scheduled appointment time.  ? ?If you have a lab appointment with the [facility] please come in thru the Main Entrance and check in at the main information desk. ? ?You need to re-schedule your appointment should you arrive 10 or more minutes late.  We strive to give you quality time with our providers, and arriving late affects you and other patients whose appointments are after yours.  Also, if you no show three or more times for appointments you may be dismissed from the clinic at the providers discretion.     ?Again, thank you for choosing [facility].  Our hope is that these requests will decrease the amount of time that you wait before being seen by our physicians.       ?_____________________________________________________________ ? ?Should you have questions after your visit to [facility], please contact our office at [phone] and follow the prompts.  Our office hours are 8:00 a.m. and 4:30 p.m. Monday - Friday.  Please note that voicemails left after 4:00 p.m. may not be returned until the following business day.  We are closed weekends and major holidays.  You do have access to a nurse 24-7, just call the main number to the clinic [phone] and do not press any options, hold on the line and a nurse will answer the phone.   ? ?For prescription refill requests, have your pharmacy contact our office and allow 72 hours.   ? ?Due to Covid, you will need to wear a mask upon entering the hospital. If you do not have a mask, a  mask will be given to you at the Main Entrance upon arrival. For doctor visits, patients may have 1 support person age 68 or older with them. For treatment visits, patients can not have anyone with them due to social distancing guidelines and our immunocompromised population.  ? ?   ?

## 2021-09-03 NOTE — Progress Notes (Signed)
? ?[facility] ?[address] ?[place], [place] [address] ? ? ?CLINIC:  ?Medical Oncology/Hematology ? ?PCP:  ?Ariana Sites, MD ?[address] / [place] [address] ?[phone] ? ? ?REASON FOR VISIT:  ?Follow-up for stage II colon cancer ? ?PRIOR THERAPY: Right colon segmental resection on 06/15/2017 ? ?NGS Results: not done ? ?CURRENT THERAPY: surveillance ? ?BRIEF ONCOLOGIC HISTORY:  ?Oncology History  ?Primary cancer of hepatic flexure of colon ([place])  ?06/15/2017 Initial Diagnosis  ? Primary cancer of hepatic flexure of colon ([place]) ? ?  ?06/15/2017 Cancer Staging  ? Staging form: Colon and Rectum - Neuroendocine Tumors, AJCC 8th Edition ?- Clinical stage from 06/15/2017: Stage IIA (cT2, cN0, cM0) - Signed by Ariana Jack, MD on 08/18/2017 ? ?  ? ? ?CANCER STAGING: ?Cancer Staging  ?Primary cancer of hepatic flexure of colon ([place]) ?Staging form: Colon and Rectum - Neuroendocine Tumors, AJCC 8th Edition ?- Clinical stage from 06/15/2017: Stage IIA (cT2, cN0, cM0) - Signed by Ariana Jack, MD on 08/18/2017 ? ? ?INTERVAL HISTORY:  ?[name], a 66 y.o. female, returns for routine follow-up of her stage II colon cancer. Ariana Long was last seen on 02/19/2021.  ? ?Today she reports feeling good. She denies changes in BM, hematochezia, hematuria, and black stools. She denies right sided abdominal pain.  ? ?REVIEW OF SYSTEMS:  ?Review of Systems  ?Constitutional:  Negative for appetite change and fatigue.  ?Gastrointestinal:  Negative for abdominal pain, blood in stool, constipation and diarrhea.  ?Genitourinary:  Negative for hematuria.   ?Neurological:  Positive for headaches.  ?Psychiatric/Behavioral:  Positive for sleep disturbance.   ?All other systems reviewed and are negative. ? ?PAST MEDICAL/SURGICAL HISTORY:  ?Past Medical History:  ?Diagnosis Date  ? Cirrhosis of liver ([place]) 01/26/2011  ? FIBROSIS on liver biopsy in 2007, U/S  03/31/2013= mild diffuse hepatic steatosis and /or  hepatocellular disease without focal hepatic parenchymal abnormality. per Dr. Patsy Long (2012) pt is immune to hep A and Hep B. per 12/06/13 note from Liver clinic-pt had MRI which showed cirrhosis 2015.  ? Diverticulitis of colon   ? Elevated AFP 01/26/2011  ? Gall stones   ? Hematuria 12/12/2014  ? Hemorrhoid 12/01/2012  ? Hemorrhoids   ? Hepatitis C 01/26/2011  ? 2005 non responder, genotype 1, Gr 1-2, Stage 3 . Treated with Harvoni at the Liver clinic, responder  ? Hidradenitis   ? History of hiatal hernia   ? History of kidney stones   ? Hx: UTI (urinary tract infection)   ? Kidney stones 10/2011  ? Osteoporosis   ? Palpitations   ? Primary cancer of hepatic flexure of colon ([place]) 06/15/2017  ? Serrated adenoma of colon 11/2011  ? Splenomegaly 01/26/2011  ? Thrombocytopenia ([place]) 01/26/2011  ? Tubular adenoma   ? Vulval lesion 12/14/2013  ? Has kissing lesions thin and puffy and paler i color about 5-6- mm in diameter.Dr Ariana Long in to co examine will try temovate and recheck in 3 months  ? ?Past Surgical History:  ?Procedure Laterality Date  ? AXILLARY SURGERY Bilateral 1979  ? "glands removed"  ? BREAST BIOPSY Right 2011  ? BREAST EXCISIONAL BIOPSY Bilateral 25 years ago  ? Benign, fatty tumors  ? CESAREAN SECTION    ? x 2  ? COLECTOMY  06/15/2017  ? LAPAROSCOPIC ASSISTED RIGHT COLECTOMY, ERAS PATHWAY/notes 06/15/2017  ? COLONOSCOPY  07/31/2005  ? Rourk-Normal rectum/Normal colonoscopy/Repeat screening colonoscopy ten years  ? COLONOSCOPY  11/16/2011  ? RMR:  Friable anorectum-likely source of hematochezia/ LARGE 1.2x2.5CM SERRATED ADENOMA resected from ascending colon 7CM DISTAL TO ICV, hyperplastic polyp removed   ? COLONOSCOPY N/A 07/18/2012  ? Dr. Gala Long- normal appearing rectal mucosa. no residual polpy tissue seen at tatoo location. remainder of colonic mucosa appeared entirely normal. bx = tubular adenoma  ? COLONOSCOPY N/A 08/29/2015  ? Dr. Gala Long: single 12 mm polyp in ascending colon, 4 mm polyp ascending. Sessile  serrated adenoma. 3 year surveillance  ? COLONOSCOPY N/A 05/19/2017  ? 4X2 cm sessile lesion at hepatic flexdure, unable to be completely removed  ? COLONOSCOPY N/A 01/18/2019  ? Dr. Gala Long: Several small polyps removed, hyperplastic.  Next colonoscopy in 5 years.  ? ESOPHAGEAL BANDING N/A 08/29/2015  ? Procedure: ESOPHAGEAL BANDING;  Surgeon: Ariana Dolin, MD;  [facility];  Service: Endoscopy;  Laterality: N/A;  ? ESOPHAGOGASTRODUODENOSCOPY N/A 08/29/2015  ? Dr. Gala Long: non-critical Schatzki's ring, somewhat prominent small submucosal vessels without obvious varices. hyperplastic gastric polyp. 2 year surveillance  ? EXTRACORPOREAL SHOCK WAVE LITHOTRIPSY Right 05/27/2021  ? Procedure: EXTRACORPOREAL SHOCK WAVE LITHOTRIPSY (ESWL);  Surgeon: Ariana Gustin, MD;  [facility]: [facility];  Service: Urology;  Laterality: Right;  ? HEMORRHOID BANDING    ? LAPAROSCOPIC RIGHT COLECTOMY N/A 06/15/2017  ? Procedure: LAPAROSCOPIC ASSISTED RIGHT COLECTOMY, ERAS PATHWAY;  Surgeon: Ariana Skates, MD;  Location: [facility];  Service: General;  Laterality: N/A;  ? POLYPECTOMY  05/19/2017  ? Procedure: POLYPECTOMY;  Surgeon: Ariana Dolin, MD;  [facility];  Service: Endoscopy;;  ? POLYPECTOMY  01/18/2019  ? Procedure: POLYPECTOMY;  Surgeon: Ariana Dolin, MD;  [facility];  Service: Endoscopy;;  ? SKIN LESION EXCISION    ? ? ?SOCIAL HISTORY:  ?Social History  ? ?Socioeconomic History  ? Marital status: Married  ?  Spouse name: Not on file  ? Number of children: 2  ? Years of education: Not on file  ? Highest education level: Not on file  ?Occupational History  ? Occupation: Software engineer  ?  Employer: [employer]  ?Tobacco Use  ? Smoking status: Never  ? Smokeless tobacco: Never  ?Vaping Use  ? Vaping Use: Never used  ?Substance and Sexual Activity  ? Alcohol use: No  ? Drug use: No  ? Sexual activity: Yes  ?  Birth control/protection: Post-menopausal  ?Other Topics Concern  ? Not on file  ?Social  History Narrative  ? Not on file  ? ?Social Determinants of Health  ? ?Financial Resource Strain: Not on file  ?Food Insecurity: Not on file  ?Transportation Needs: Not on file  ?Physical Activity: Not on file  ?Stress: Not on file  ?Social Connections: Not on file  ?Intimate Partner Violence: Not on file  ? ? ?FAMILY HISTORY:  ?Family History  ?Problem Relation Age of Onset  ? Heart attack Mother   ? Stroke Mother   ? Aneurysm Father   ? Colon cancer Neg Hx   ? ? ?CURRENT MEDICATIONS:  ?Current Outpatient Medications  ?Medication Sig Dispense Refill  ? Ascorbic Acid (VITAMIN C) 1000 MG tablet Take 1,000 mg by mouth daily.    ? augmented betamethasone dipropionate (DIPROLENE-AF) 0.05 % cream Apply topically as needed.    ? Cholecalciferol (VITAMIN D3) 50 MCG (2000 UT) TABS Take 6,000 Units by mouth daily.    ? fluconazole (DIFLUCAN) 150 MG tablet Take one tablet by mouth, repeat in 72 hours for total of 2 doses. 2 tablet 0  ?  fluticasone (CUTIVATE) 0.05 % cream Apply 1 application topically 2 (two) times daily as needed (lichen planus).    ? fluticasone (FLONASE) 50 MCG/ACT nasal spray Place 1 spray into both nostrils daily as needed for allergies or rhinitis.    ? Magnesium 250 MG TABS Take 1 tablet by mouth daily.    ? milk thistle 175 MG tablet Take 1,752 mg by mouth daily.    ? Multiple Vitamins-Minerals (HAIR/SKIN/NAILS/BIOTIN PO) Take 1 tablet by mouth daily.    ? OLIVE LEAF EXTRACT PO Take 1 tablet by mouth as needed.    ? ondansetron (ZOFRAN) 4 MG tablet Take 1 tablet (4 mg total) by mouth daily as needed for nausea or vomiting. 30 tablet 1  ? pantoprazole (PROTONIX) 40 MG tablet Take 40 mg by mouth daily. As needed    ? Probiotic CAPS Take 1 capsule by mouth daily.     ? Quercetin 250 MG TABS Take 500 mg by mouth.     ? tamsulosin (FLOMAX) 0.4 MG CAPS capsule Take 1 capsule (0.4 mg total) by mouth daily after supper. 30 capsule 0  ? VITAMIN A PO Take 1 tablet by mouth daily.     ? zinc gluconate 50 MG  tablet Take 50 mg by mouth daily.    ? ?No current facility-administered medications for this visit.  ? ? ?ALLERGIES:  ?No Known Allergies ? ?PHYSICAL EXAM:  ?Performance status (ECOG): 0 - Asymptomatic ? ?There w

## 2021-09-07 DIAGNOSIS — Z20822 Contact with and (suspected) exposure to covid-19: Secondary | ICD-10-CM | POA: Diagnosis not present

## 2021-09-09 DIAGNOSIS — Z20822 Contact with and (suspected) exposure to covid-19: Secondary | ICD-10-CM | POA: Diagnosis not present

## 2021-11-05 ENCOUNTER — Encounter: Payer: Self-pay | Admitting: Gastroenterology

## 2021-11-05 ENCOUNTER — Ambulatory Visit (INDEPENDENT_AMBULATORY_CARE_PROVIDER_SITE_OTHER): Payer: Medicare Other | Admitting: Gastroenterology

## 2021-11-05 ENCOUNTER — Other Ambulatory Visit (HOSPITAL_COMMUNITY)
Admission: RE | Admit: 2021-11-05 | Discharge: 2021-11-05 | Disposition: A | Discharge status: Home / Self Care | Payer: Medicare Other | Source: Ambulatory Visit | Attending: Gastroenterology | Admitting: Gastroenterology

## 2021-11-05 VITALS — BP 128/81 | HR 66 | Temp 97.5°F | Ht 65.0 in | Wt 149.2 lb

## 2021-11-05 DIAGNOSIS — K219 Gastro-esophageal reflux disease without esophagitis: Secondary | ICD-10-CM

## 2021-11-05 DIAGNOSIS — Z8619 Personal history of other infectious and parasitic diseases: Secondary | ICD-10-CM

## 2021-11-05 DIAGNOSIS — Z85038 Personal history of other malignant neoplasm of large intestine: Secondary | ICD-10-CM

## 2021-11-05 DIAGNOSIS — K649 Unspecified hemorrhoids: Secondary | ICD-10-CM

## 2021-11-05 DIAGNOSIS — K7460 Unspecified cirrhosis of liver: Secondary | ICD-10-CM

## 2021-11-05 LAB — CBC
HCT: 40.2 % (ref 36.0–46.0)
Hemoglobin: 13.3 g/dL (ref 12.0–15.0)
MCH: 30.6 pg (ref 26.0–34.0)
MCHC: 33.1 g/dL (ref 30.0–36.0)
MCV: 92.4 fL (ref 80.0–100.0)
Platelets: 156 10*3/uL (ref 150–400)
RBC: 4.35 MIL/uL (ref 3.87–5.11)
RDW: 13.4 % (ref 11.5–15.5)
WBC: 3.9 10*3/uL — ABNORMAL LOW (ref 4.0–10.5)
nRBC: 0 % (ref 0.0–0.2)

## 2021-11-05 LAB — COMPREHENSIVE METABOLIC PANEL
ALT: 28 U/L (ref 0–44)
AST: 29 U/L (ref 15–41)
Albumin: 4 g/dL (ref 3.5–5.0)
Alkaline Phosphatase: 76 U/L (ref 38–126)
Anion gap: 6 (ref 5–15)
BUN: 14 mg/dL (ref 8–23)
CO2: 28 mmol/L (ref 22–32)
Calcium: 9.1 mg/dL (ref 8.9–10.3)
Chloride: 107 mmol/L (ref 98–111)
Creatinine, Ser: 0.65 mg/dL (ref 0.44–1.00)
GFR, Estimated: >60 mL/min (ref >60)
Glucose, Bld: 97 mg/dL (ref 70–99)
Potassium: 3.8 mmol/L (ref 3.5–5.1)
Sodium: 141 mmol/L (ref 135–145)
Total Bilirubin: 0.7 mg/dL (ref 0.3–1.2)
Total Protein: 7.6 g/dL (ref 6.5–8.1)

## 2021-11-05 LAB — PROTIME-INR
INR: 1 (ref 0.8–1.2)
Prothrombin Time: 13.2 seconds (ref 11.4–15.2)

## 2021-11-05 MED ORDER — HYDROCORTISONE ACETATE 25 MG RE SUPP: 25.0000 mg | Freq: Two times a day (BID) | RECTAL | 1 refills | Status: DC

## 2021-11-05 NOTE — Patient Instructions (Signed)
I have ordered labs for you completed at the hospital prior request.  As discussed this is so we can calculate your MELD score to monitor your progression of liver disease.  If you do not have any CT imaging planned within the next 6 months with oncology, will place on the recall list for right upper quadrant ultrasound to monitor for liver lesions in the next 3 to 6 months. (Reminder will be sent for October).  I have sent in Anusol suppositories for you to use twice daily for 14 days with 1 refill.  If your hemorrhoids become more bothersome or you begin having any intermittent bleeding we will have this available.  Continue pantoprazole 40 mg daily.  Was a pleasure to meet you today!  I hope you have a great summer and Thanksgiving/Christmas we will see you in 6 months or sooner if needed!  It was a pleasure to see you today. I want to create trusting relationships with patients. If you receive a survey regarding your visit,  I greatly appreciate you taking time to fill this out on paper or through your MyChart. I value your feedback.  Venetia Night, MSN, FNP-BC, AGACNP-BC [facility]

## 2021-11-05 NOTE — Progress Notes (Signed)
GI Office Note    Referring Provider: Sharilyn Sites, MD Primary Care Physician:  Sharilyn Sites, MD Primary GI: Dr. Gala Romney  Date:  11/05/2021  ID:  Ariana, Long October 10, 1955, MRN [id number]   Chief Complaint   Chief Complaint  Patient presents with   Rectal Pain    Rectal pain has gotten better. No blood. Rectum is itching.      History of Present Illness  Ariana Long is a 66 y.o. female with a history of stage II colon cancer involving the hepatic flexure 06/2017 s/p right segmental resection, hemorrhoids, hepatitis C s/p eradication, and cirrhosis with liver biopsy in 2007, presenting today for follow up of cirrhosis.   EGD 2017 without esophageal varices, she was recommended to have 2-year surveillance but requested to postpone procedure. Last colonoscopy September 2020 with several hyperplastic polyps removed (repeat in 5 years).  Previously did not tolerate hemorrhoid banding.   Recent CT abdomen pelvis 08/20/2021: No suspicious hepatic lesions, cholelithiasis without findings of acute cholecystitis, no biliary ductal dilation, normal pancreas and spleen, small hiatal hernia, stable postsurgical change of partial right hemicolectomy without findings suspicious for recurrent tumor.  Recent labs 05/25/21: CBC normal except thrombocytopenia - plts 129, Bilirubin 0, AST, ALT, Alk phos WNL. No recent Pt/INR on file - unable to calculate MELD. Last MELD score 6 from March 2022.  Last seen in the office 01/2021.  She reported waking up due to abdominal pain, occurring 3 times in 6 months with symptoms lasting about 30 minutes at a time described intermittent lower abdomen.  She would get up and walk,/her abdomen will have a bowel movement symptoms will resolve.  Reported stools were not soft, typically having a bowel movement twice per day.  DRE normal.  She was given trial of Anusol cream to use twice daily for 14 days.  Also given Diflucan for concerns of possible yeast infection.  She  was advised to follow-up in 6 months.  Patient previously stated she wanted to hold off on EGD until time of repeat colonoscopy in 2025.  Today: States a growth had gotten bigger and was inflamed. It was itching a lot and was soaking in the bathtub with warm water and the pain stopped and the growth has been getting smaller. Has lichen planus. Feels like she has a yeast infection as well. Has had a little bleeding with wiping from time to time, last time was about 4 weeks ago. Has probably happened about 3 times and usually happens when she has Had one day of her stools being green, possibly could have been something she had eaten. Denies abdominal pain. No melena. Would prefer to have the Anusol suppositories over the creams for management. Denies dysphagia, nausea, vomiting. Denies bleeding episodes, bruising, fatigue.    Past Medical History:  Diagnosis Date   Cirrhosis of liver ([place]) 01/26/2011   FIBROSIS on liver biopsy in 2007, U/S  03/31/2013= mild diffuse hepatic steatosis and /or hepatocellular disease without focal hepatic parenchymal abnormality. per Dr. Patsy [place] (2012) pt is immune to hep A and Hep B. per 12/06/13 note from Liver clinic-pt had MRI which showed cirrhosis 2015.   Diverticulitis of colon    Elevated AFP 01/26/2011   Gall stones    Hematuria 12/12/2014   Hemorrhoid 12/01/2012   Hemorrhoids    Hepatitis C 01/26/2011   2005 non responder, genotype 1, Gr 1-2, Stage 3 . Treated with Harvoni at the Liver clinic, responder   Hidradenitis  History of hiatal hernia    History of kidney stones    Hx: UTI (urinary tract infection)    Kidney stones 10/2011   Osteoporosis    Palpitations    Primary cancer of hepatic flexure of colon ([place]) 06/15/2017   Serrated adenoma of colon 11/2011   Splenomegaly 01/26/2011   Thrombocytopenia ([place]) 01/26/2011   Tubular adenoma    Vulval lesion 12/14/2013   Has kissing lesions thin and puffy and paler i color about 5-6- mm in diameter.Dr Glo Herring in  to co examine will try temovate and recheck in 3 months    Past Surgical History:  Procedure Laterality Date   AXILLARY SURGERY Bilateral 1979   "glands removed"   BREAST BIOPSY Right 2011   BREAST EXCISIONAL BIOPSY Bilateral 25 years ago   Benign, fatty tumors   CESAREAN SECTION     x 2   COLECTOMY  06/15/2017   LAPAROSCOPIC ASSISTED RIGHT COLECTOMY, ERAS PATHWAY/notes 06/15/2017   COLONOSCOPY  07/31/2005   Rourk-Normal rectum/Normal colonoscopy/Repeat screening colonoscopy ten years   COLONOSCOPY  11/16/2011   RMR: Friable anorectum-likely source of hematochezia/ LARGE 1.2x2.5CM SERRATED ADENOMA resected from ascending colon 7CM DISTAL TO ICV, hyperplastic polyp removed    COLONOSCOPY N/A 07/18/2012   Dr. Gala Romney- normal appearing rectal mucosa. no residual polpy tissue seen at tatoo location. remainder of colonic mucosa appeared entirely normal. bx = tubular adenoma   COLONOSCOPY N/A 08/29/2015   Dr. Gala Romney: single 12 mm polyp in ascending colon, 4 mm polyp ascending. Sessile serrated adenoma. 3 year surveillance   COLONOSCOPY N/A 05/19/2017   4X2 cm sessile lesion at hepatic flexdure, unable to be completely removed   COLONOSCOPY N/A 01/18/2019   Dr. Gala Romney: Several small polyps removed, hyperplastic.  Next colonoscopy in 5 years.   ESOPHAGEAL BANDING N/A 08/29/2015   Procedure: ESOPHAGEAL BANDING;  Surgeon: Daneil Dolin, MD;  [facility];  Service: Endoscopy;  Laterality: N/A;   ESOPHAGOGASTRODUODENOSCOPY N/A 08/29/2015   Dr. Gala Romney: non-critical Schatzki's ring, somewhat prominent small submucosal vessels without obvious varices. hyperplastic gastric polyp. 2 year surveillance   EXTRACORPOREAL SHOCK WAVE LITHOTRIPSY Right 05/27/2021   Procedure: EXTRACORPOREAL SHOCK WAVE LITHOTRIPSY (ESWL);  Surgeon: Cleon Gustin, MD;  [facility];  Service: Urology;  Laterality: Right;   HEMORRHOID BANDING     LAPAROSCOPIC RIGHT COLECTOMY N/A 06/15/2017   Procedure: LAPAROSCOPIC  ASSISTED RIGHT COLECTOMY, ERAS PATHWAY;  Surgeon: Fanny Skates, MD;  Location: [facility];  Service: General;  Laterality: N/A;   POLYPECTOMY  05/19/2017   Procedure: POLYPECTOMY;  Surgeon: Daneil Dolin, MD;  [facility];  Service: Endoscopy;;   POLYPECTOMY  01/18/2019   Procedure: POLYPECTOMY;  Surgeon: Daneil Dolin, MD;  [facility];  Service: Endoscopy;;   SKIN LESION EXCISION      Current Outpatient Medications  Medication Sig Dispense Refill   Ascorbic Acid (VITAMIN C) 1000 MG tablet Take 1,000 mg by mouth daily.     augmented betamethasone dipropionate (DIPROLENE-AF) 0.05 % cream Apply topically as needed.     Cholecalciferol (VITAMIN D3) 50 MCG (2000 UT) TABS Take 6,000 Units by mouth daily.     fluticasone (FLONASE) 50 MCG/ACT nasal spray Place 1 spray into both nostrils daily as needed for allergies or rhinitis.     milk thistle 175 MG tablet Take 1,752 mg by mouth daily.     Multiple Vitamins-Minerals (HAIR/SKIN/NAILS/BIOTIN PO) Take 1 tablet by mouth daily.     OLIVE LEAF EXTRACT  PO Take 1 tablet by mouth as needed.     Probiotic CAPS Take 1 capsule by mouth daily.      Quercetin 250 MG TABS Take 500 mg by mouth.      VITAMIN A PO Take 1 tablet by mouth daily.      zinc gluconate 50 MG tablet Take 50 mg by mouth daily.     fluconazole (DIFLUCAN) 150 MG tablet Take one tablet by mouth, repeat in 72 hours for total of 2 doses. (Patient not taking: Reported on 11/05/2021) 2 tablet 0   fluticasone (CUTIVATE) 0.05 % cream Apply 1 application topically 2 (two) times daily as needed (lichen planus). (Patient not taking: Reported on 11/05/2021)     hydrocortisone (ANUSOL-HC) 25 MG suppository Place 1 suppository (25 mg total) rectally 2 (two) times daily. 14 suppository 1   Magnesium 250 MG TABS Take 1 tablet by mouth daily. (Patient not taking: Reported on 11/05/2021)     ondansetron (ZOFRAN) 4 MG tablet Take 1 tablet (4 mg total) by mouth daily as needed for nausea  or vomiting. (Patient not taking: Reported on 09/03/2021) 30 tablet 1   pantoprazole (PROTONIX) 40 MG tablet Take 40 mg by mouth daily. As needed (Patient not taking: Reported on 11/05/2021)     tamsulosin (FLOMAX) 0.4 MG CAPS capsule Take 1 capsule (0.4 mg total) by mouth daily after supper. (Patient not taking: Reported on 11/05/2021) 30 capsule 0   No current facility-administered medications for this visit.    Allergies as of 11/05/2021   (No Known Allergies)    Family History  Problem Relation Age of Onset   Heart attack Mother    Stroke Mother    Aneurysm Father    Colon cancer Neg Hx     Social History   Socioeconomic History   Marital status: Married    Spouse name: Not on file   Number of children: 2   Years of education: Not on file   Highest education level: Not on file  Occupational History   Occupation: Social worker Op    Employer: [employer]  Tobacco Use   Smoking status: Never   Smokeless tobacco: Never  Vaping Use   Vaping Use: Never used  Substance and Sexual Activity   Alcohol use: No   Drug use: No   Sexual activity: Yes    Birth control/protection: Post-menopausal  Other Topics Concern   Not on file  Social History Narrative   Not on file   Social Determinants of Health   Financial Resource Strain: Low Risk  (04/05/2020)   Overall Financial Resource Strain (CARDIA)    Difficulty of Paying Living Expenses: Not hard at all  Food Insecurity: No Food Insecurity (04/05/2020)   Hunger Vital Sign    Worried About Running Out of Food in the Last Year: Never true    [place] in the Last Year: Never true  Transportation Needs: No Transportation Needs (04/05/2020)   PRAPARE - Hydrologist (Medical): No    Lack of Transportation (Non-Medical): No  Physical Activity: Insufficiently Active (04/05/2020)   Exercise Vital Sign    Days of Exercise per Week: 2 days    Minutes of Exercise per Session: 50 min  Stress: No Stress  Concern Present (04/05/2020)   [place]    Feeling of Stress : Not at all  Social Connections: [place] (04/05/2020)   Social Connection and Isolation  Panel [NHANES]    Frequency of Communication with Friends and Family: More than three times a week    Frequency of Social Gatherings with Friends and Family: More than three times a week    Attends Religious Services: More than 4 times per year    Active Member of [employer] or Organizations: Yes    Attends Archivist Meetings: 1 to 4 times per year    Marital Status: Married     Review of Systems   Gen: Denies fever, chills, anorexia. Denies fatigue, weakness, weight loss.  CV: Denies chest pain, palpitations, syncope, peripheral edema, and claudication. Resp: Denies dyspnea at rest, cough, wheezing, coughing up blood, and pleurisy. GI: see HPI Derm: Denies rash, itching, dry skin Psych: Denies depression, anxiety, memory loss, confusion. No homicidal or suicidal ideation.  Heme: Denies bruising, bleeding, and enlarged lymph nodes.   Physical Exam   BP 128/81   Pulse 66   Temp (!) 97.5 F (36.4 C) (Temporal)   Ht '5\' 5"'  (1.651 m)   Wt 149 lb 3.2 oz (67.7 kg)   BMI 24.83 kg/m   General:   Alert and oriented. No distress noted. Pleasant and cooperative.  Head:  Normocephalic and atraumatic. Eyes:  Conjuctiva clear without scleral icterus. Lungs:  Clear to auscultation bilaterally. No wheezes, rales, or rhonchi. No distress.  Heart:  S1, S2 present without murmurs appreciated.  Abdomen:  +BS, soft, non-tender and non-distended. No rebound or guarding. No HSM or masses noted. Rectal: good tone, lichen planus present, small internal right anterior hemorrhoid Msk:  Symmetrical without gross deformities. Normal posture. Extremities:  Without edema. Neurologic:  Alert and  oriented x4 Psych:  Alert and cooperative. Normal mood and  affect.   Assessment  Ariana Long is a 66 y.o. female with a history of GERD, Hep c s/p eradication, colon cancer s/p segmental resection in 2019, and cirrhosis  presenting today for cirrhosis follow up.   GERD: Well controlled in pantoprazole 40 mg daily.   History of Hepatitis C s/p eradication/Cirrhosis: Noted on imaging of MRI in 2015 and PET scan April 2019 with concern for liver nodularity. Liver biopsy in 2007 with fibrosis. Last EGD in 2017 with no evidence of esophageal varices or portal hypertensive gastropathy. She has had regular CT scans with most recent in April 2023 without evidence of splenomegaly or hepatic lesions. Last MELD score 6 in March 2022. Most recent labs January 2023 with normal LFTs and mild thrombocytopenia with plts 129. Will obtain liver imaging in about 3 months to stay with every 6 months surveillance. Patient prefers holding off on EGD for variceal surveillance until time of colonoscopy as long as no evidence of worsening liver disease occurs. Will check labs today to obtain updated MELD score.   History of colon cancer: Follows with oncology, Dr. Delton Coombes. Resection in 2019. Has regular follow up imaging. Most recent CT in April 2023 without recurrence. Last colonoscopy in 2020, due for repeat in September 2025. No alarm symptoms present, only toilet tissue hematochezia related to hemorrhoids.   Hemorrhoids: History of hemorrhoids with intermittent rectal bleeding. Has tried hemorrhoid banding in the past but not able to tolerate due to pain. Has used suppositories in the past but insurance would not cover, is willing to pay out of pocket as she does not like to use the cream. Only has intermittent hard stools, has had about 3 episodes of toilet tissue hematochezia in 6 months. No overt constipation. Complained of itching and  burning a few weeks ago feeling like there was a growth on the left and right that was protruding but soaked in warm bath multiple days and  symptoms improved.   PLAN   CBC, CMP, PT/INR RUQ ultrasound in 3 months (October) Continue pantoprazole 40 mg daily. Anusol suppositories BID for 14 days, 1 refill Follow up in 6 months Colonoscopy in 2025, will complete EGD at same time for variceal screening as long as no issue arise in the meantime.      Venetia Night, MSN, FNP-BC, AGACNP-BC [facility]

## 2021-12-22 DIAGNOSIS — H40013 Open angle with borderline findings, low risk, bilateral: Secondary | ICD-10-CM | POA: Diagnosis not present

## 2021-12-22 DIAGNOSIS — H43812 Vitreous degeneration, left eye: Secondary | ICD-10-CM | POA: Diagnosis not present

## 2021-12-22 DIAGNOSIS — D696 Thrombocytopenia, unspecified: Secondary | ICD-10-CM | POA: Diagnosis not present

## 2021-12-22 DIAGNOSIS — H25813 Combined forms of age-related cataract, bilateral: Secondary | ICD-10-CM | POA: Diagnosis not present

## 2021-12-22 DIAGNOSIS — J329 Chronic sinusitis, unspecified: Secondary | ICD-10-CM | POA: Diagnosis not present

## 2021-12-22 DIAGNOSIS — J209 Acute bronchitis, unspecified: Secondary | ICD-10-CM | POA: Diagnosis not present

## 2021-12-22 DIAGNOSIS — H35413 Lattice degeneration of retina, bilateral: Secondary | ICD-10-CM | POA: Diagnosis not present

## 2021-12-22 DIAGNOSIS — Z6823 Body mass index (BMI) 23.0-23.9, adult: Secondary | ICD-10-CM | POA: Diagnosis not present

## 2021-12-26 ENCOUNTER — Other Ambulatory Visit (HOSPITAL_COMMUNITY): Payer: Self-pay | Admitting: Internal Medicine

## 2021-12-26 ENCOUNTER — Ambulatory Visit (HOSPITAL_COMMUNITY)
Admission: RE | Admit: 2021-12-26 | Discharge: 2021-12-26 | Disposition: A | Discharge status: Home / Self Care | Payer: Medicare Other | Source: Ambulatory Visit | Attending: Internal Medicine | Admitting: Internal Medicine

## 2021-12-26 DIAGNOSIS — Z6823 Body mass index (BMI) 23.0-23.9, adult: Secondary | ICD-10-CM | POA: Diagnosis not present

## 2021-12-26 DIAGNOSIS — R051 Acute cough: Secondary | ICD-10-CM

## 2021-12-26 DIAGNOSIS — D696 Thrombocytopenia, unspecified: Secondary | ICD-10-CM | POA: Diagnosis not present

## 2021-12-26 DIAGNOSIS — R053 Chronic cough: Secondary | ICD-10-CM | POA: Diagnosis not present

## 2021-12-26 DIAGNOSIS — J329 Chronic sinusitis, unspecified: Secondary | ICD-10-CM | POA: Diagnosis not present

## 2021-12-26 DIAGNOSIS — R059 Cough, unspecified: Secondary | ICD-10-CM | POA: Diagnosis not present

## 2022-01-02 DIAGNOSIS — J45909 Unspecified asthma, uncomplicated: Secondary | ICD-10-CM | POA: Diagnosis not present

## 2022-01-02 DIAGNOSIS — J45998 Other asthma: Secondary | ICD-10-CM | POA: Diagnosis not present

## 2022-01-02 DIAGNOSIS — Z6823 Body mass index (BMI) 23.0-23.9, adult: Secondary | ICD-10-CM | POA: Diagnosis not present

## 2022-01-07 ENCOUNTER — Telehealth: Payer: Self-pay | Admitting: *Deleted

## 2022-01-07 ENCOUNTER — Encounter: Payer: Self-pay | Admitting: *Deleted

## 2022-01-07 NOTE — Patient Outreach (Signed)
  Care Coordination   Initial Visit Note   01/07/2022 Name: LARIYA KINZIE MRN: [id number] DOB: 1955-10-25  PEYTON ROSSNER is a 66 y.o. year old female who sees Sharilyn Sites, MD for primary care. I spoke with  [name] by phone today.  What matters to the patients health and wellness today?  Ongoing self health management    Goals Addressed             This Visit's Progress    COMPLETED: Care Coordination Services (no follow-up required)       Care Coordination Interventions: Reviewed medications with patient and discussed affordability Assessed social determinant of health barriers Assessed mobility and ability to perform ADLs Assessed family/Social support Provided patient/caregiver with verbal information on [place] [phone]) Encouraged patient to request a referral for [facility] from PCP if services are needed in the future         SDOH assessments and interventions completed:  Yes  SDOH Interventions Today    Flowsheet Row Most Recent Value  SDOH Interventions   Housing Interventions Intervention Not Indicated  Transportation Interventions Intervention Not Indicated  Utilities Interventions Intervention Not Indicated  Financial Strain Interventions Intervention Not Indicated        Care Coordination Interventions Activated:  Yes  Care Coordination Interventions:  Yes, provided   Follow up plan: No further intervention required.   Encounter Outcome:  Pt. Visit Completed   Chong Sicilian, BSN, RN-BC [facility] Pharmacist, community Dial: [phone]

## 2022-01-21 ENCOUNTER — Ambulatory Visit (HOSPITAL_COMMUNITY)
Admission: RE | Admit: 2022-01-21 | Discharge: 2022-01-21 | Disposition: A | Discharge status: Home / Self Care | Payer: Medicare Other | Source: Ambulatory Visit | Attending: Physician Assistant | Admitting: Physician Assistant

## 2022-01-21 ENCOUNTER — Ambulatory Visit (HOSPITAL_COMMUNITY): Payer: Medicare Other

## 2022-01-21 DIAGNOSIS — N200 Calculus of kidney: Secondary | ICD-10-CM | POA: Diagnosis not present

## 2022-02-04 ENCOUNTER — Ambulatory Visit (INDEPENDENT_AMBULATORY_CARE_PROVIDER_SITE_OTHER): Payer: Medicare Other | Admitting: Physician Assistant

## 2022-02-04 VITALS — BP 113/69 | HR 64

## 2022-02-04 DIAGNOSIS — N200 Calculus of kidney: Secondary | ICD-10-CM | POA: Diagnosis not present

## 2022-02-04 LAB — URINALYSIS, ROUTINE W REFLEX MICROSCOPIC
Bilirubin, UA: NEGATIVE
Glucose, UA: NEGATIVE
Nitrite, UA: NEGATIVE
Protein,UA: NEGATIVE
Specific Gravity, UA: 1.02 (ref 1.005–1.030)
Urobilinogen, Ur: 0.2 mg/dL (ref 0.2–1.0)
pH, UA: 5 (ref 5.0–7.5)

## 2022-02-04 LAB — MICROSCOPIC EXAMINATION: Bacteria, UA: NONE SEEN

## 2022-02-04 NOTE — Progress Notes (Signed)
Assessment: 1. Kidney stones - Urinalysis, Routine w reflex microscopic - Ultrasound renal complete; Future    Plan: Discussed US findings with pt. FU in 6 month after renal US and continue stone prevention diet.     Chief Complaint: No chief complaint on file.   HPI: Ariana Long is a 66 y.o. female who presents for continued evaluation of nephrolithiasis. She continues to do well since ESL of right sided ureteral stone in January. No c/o pain or LUTs today. She had 6 month renal US which shows 11m stone right kidney. Mild bilateral pelvicaliectasis.   UA=clear  07/23/21 Ariana HATCHERis a 66y.o. female who presents for continued evaluation of nephrolithiasis, status post EWS L on 05/27/2021.  She states she is doing well and has no urinary complaints today.  No hematuria.  No pain.  She is diligently working to remain adequately hydrated and follow recommended stone prevention diet.  Renal ultrasound obtained last week reveals no evidence of hydronephrosis or obstruction.    UA = 0-5 WBCs, 0-2 RBCs, few bacteria, nitrite negative.  Within normal ranges.  Portions of the above documentation were copied from a prior visit for review purposes only.  Allergies: No Known Allergies  PMH: Past Medical History:  Diagnosis Date   [place] 01[place]   FIBROSIS on liver biopsy in 2007, U/S  03/31/2013= mild diffuse hepatic steatosis and /or hepatocellular disease without focal hepatic parenchymal abnormality. per Dr. ZPatsy [place](2012) pt is immune to hep A and Hep B. per 12/06/13 note from Liver clinic-pt had MRI which showed cirrhosis 2015.   Diverticulitis of colon    Elevated AFP 01/26/2011   Gall stones    Hematuria 12/12/2014   Hemorrhoid 12/01/2012   Hemorrhoids    Hepatitis C 01/26/2011   2005 non responder, genotype 1, Gr 1-2, Stage 3 . Treated with Harvoni at the Liver clinic, responder   Hidradenitis    History of hiatal hernia    History of kidney stones     Hx: UTI (urinary tract infection)    Kidney stones 10/2011   Osteoporosis    Palpitations    [place] 06/15[place]   Serrated adenoma of colon 11/2011   Splenomegaly 01/26/2011   Thrombocytopenia ([place] 01/26/2011   Tubular adenoma    Vulval lesion 12/14/2013   Has kissing lesions thin and puffy and paler i color about 5-6- mm in diameter.Dr fGlo Herringin to co examine will try temovate and recheck in 3 months    PSH: Past Surgical History:  Procedure Laterality Date   AXILLARY SURGERY Bilateral 1979   "glands removed"   BREAST BIOPSY Right 2011   BREAST EXCISIONAL BIOPSY Bilateral 25 years ago   Benign, fatty tumors   CESAREAN SECTION     x 2   COLECTOMY  06/15/2017   LAPAROSCOPIC ASSISTED RIGHT COLECTOMY, ERAS PATHWAY/notes 06/15/2017   COLONOSCOPY  07/31/2005   [place]-Normal rectum/Normal colonoscopy/Repeat screening colonoscopy ten years   COLONOSCOPY  11/16/2011   RMR: Friable anorectum-likely source of hematochezia/ LARGE 1.2x2.5CM SERRATED ADENOMA resected from ascending colon 7CM DISTAL TO ICV, hyperplastic polyp removed    COLONOSCOPY N/A 07/18/2012   Dr. RGala Romney normal appearing rectal mucosa. no residual polpy tissue seen at tatoo location. remainder of colonic mucosa appeared entirely normal. bx = tubular adenoma   COLONOSCOPY N/A 08/29/2015   Dr. RGala Romney single 12 mm polyp in ascending colon, 4 mm polyp ascending. Sessile serrated adenoma. 3  year surveillance   COLONOSCOPY N/A 05/19/2017   4X2 cm sessile lesion at hepatic flexdure, unable to be completely removed   COLONOSCOPY N/A 01/18/2019   Dr. Gala Romney: Several small polyps removed, hyperplastic.  Next colonoscopy in 5 years.   ESOPHAGEAL BANDING N/A 08/29/2015   Procedure: ESOPHAGEAL BANDING;  Surgeon: Daneil Dolin, MD;  [facility];  Service: Endoscopy;  Laterality: N/A;   ESOPHAGOGASTRODUODENOSCOPY N/A 08/29/2015   Dr. Gala Romney: non-critical Schatzki's ring, somewhat prominent  small submucosal vessels without obvious varices. hyperplastic gastric polyp. 2 year surveillance   EXTRACORPOREAL SHOCK WAVE LITHOTRIPSY Right 05/27/2021   Procedure: EXTRACORPOREAL SHOCK WAVE LITHOTRIPSY (ESWL);  Surgeon: Cleon Gustin, MD;  Location: [facility];  Service: Urology;  Laterality: Right;   HEMORRHOID BANDING     LAPAROSCOPIC RIGHT COLECTOMY N/A 06/15/2017   Procedure: LAPAROSCOPIC ASSISTED RIGHT COLECTOMY, ERAS PATHWAY;  Surgeon: Fanny Skates, MD;  Location: [facility];  Service: General;  Laterality: N/A;   POLYPECTOMY  05/19/2017   Procedure: POLYPECTOMY;  Surgeon: Daneil Dolin, MD;  [facility];  Service: Endoscopy;;   POLYPECTOMY  01/18/2019   Procedure: POLYPECTOMY;  Surgeon: Daneil Dolin, MD;  [facility];  Service: Endoscopy;;   SKIN LESION EXCISION      SH: Social History   Tobacco Use   Smoking status: Never   Smokeless tobacco: Never  Vaping Use   Vaping Use: Never used  Substance Use Topics   Alcohol use: No   Drug use: No    ROS: All other review of systems were reviewed and are negative except what is noted above in HPI  PE: BP 113/69   Pulse 64  GENERAL APPEARANCE:  Well appearing, well developed, well nourished, NAD HEENT:  Atraumatic, normocephalic NECK:  Supple. Trachea midline ABDOMEN:  Soft, non-tender, no masses EXTREMITIES:  Moves all extremities well, without clubbing, cyanosis, or edema NEUROLOGIC:  Alert and oriented x 3, normal gait MENTAL STATUS:  appropriate BACK:  Non-tender to palpation, No CVAT SKIN:  Warm, dry, and intact   Results: Laboratory Data: Lab Results  Component Value Date   WBC 3.9 (L) 11/05/2021   HGB 13.3 11/05/2021   HCT 40.2 11/05/2021   MCV 92.4 11/05/2021   PLT 156 11/05/2021    Lab Results  Component Value Date   CREATININE 0.65 11/05/2021    Lab Results  Component Value Date   HGBA1C 5.5 06/14/2017    Urinalysis    Component Value Date/Time   COLORURINE YELLOW  05/25/2021 0425   APPEARANCEUR Clear 07/23/2021 1427   LABSPEC 1.033 (H) 05/25/2021 0425   PHURINE 6.0 05/25/2021 0425   GLUCOSEU Negative 07/23/2021 1427   HGBUR LARGE (A) 05/25/2021 0425   BILIRUBINUR Negative 07/23/2021 1427   KETONESUR 5 (A) 05/25/2021 0425   PROTEINUR Negative 07/23/2021 1427   PROTEINUR NEGATIVE 05/25/2021 0425   UROBILINOGEN 0.2 10/17/2013 0617   NITRITE Negative 07/23/2021 1427   NITRITE NEGATIVE 05/25/2021 0425   LEUKOCYTESUR Trace (A) 07/23/2021 1427   LEUKOCYTESUR NEGATIVE 05/25/2021 0425    Lab Results  Component Value Date   LABMICR See below: 07/23/2021   WBCUA 0-5 07/23/2021   RBCUA 11-30 (A) 01/27/2017   LABEPIT 0-10 07/23/2021   MUCUS Present 07/23/2021   BACTERIA Few 07/23/2021    Pertinent Imaging: Results for orders placed during the hospital encounter of 06/11/21  DG Abd 1 View  Narrative CLINICAL DATA:  Status post ESWL.  EXAM: ABDOMEN - 1 VIEW  COMPARISON:  Radiograph dated 05/27/2021.  FINDINGS: No radiopaque calculi noted over the renal silhouettes or along the course of the ureters. The previously described 5 mm radiopaque focus over the right sacrum is not visualized.  Moderate stool throughout the colon. No bowel dilatation or evidence of obstruction. No free air. The osseous structures are intact.  IMPRESSION: No radiopaque calculi noted.   Electronically Signed By: Anner Crete M.D. On: 06/12/2021 11:15  No results found for this or any previous visit.  No results found for this or any previous visit.  No results found for this or any previous visit.  Results for orders placed during the hospital encounter of 01/21/22  Ultrasound renal complete  Narrative CLINICAL DATA:  ESWL January 2023.  EXAM: RENAL / URINARY TRACT ULTRASOUND COMPLETE  COMPARISON:  CT scan of the abdomen and pelvis August 20, 2021.  FINDINGS: Right Kidney:  Renal measurements: 12.1 x 6.2 x 5.9 cm = volume: 233 mL.  Contains a 3 mm stone. Mild pelvicaliectasis on the right.  Left Kidney:  Renal measurements: 11.7 x 4.9 x 6.4 cm = volume: 191 mL. Mild pelviectasis on the left.  Bladder:  Appears normal for degree of bladder distention.  Other:  None.  IMPRESSION: 1. A 3 mm stone is identified in the right kidney. 2. Mild pelvicaliectasis on the right, new in the interval. Mild pelviectasis on the left, new in the interval. Recommend clinical correlation.   Electronically Signed By: Dorise Bullion III M.D. On: 01/22/2022 08:46  No valid procedures specified. No results found for this or any previous visit.  No results found for this or any previous visit.  No results found for this or any previous visit (from the past 24 hour(s)).

## 2022-02-25 DIAGNOSIS — H40013 Open angle with borderline findings, low risk, bilateral: Secondary | ICD-10-CM | POA: Diagnosis not present

## 2022-03-04 ENCOUNTER — Inpatient Hospital Stay: Payer: Medicare Other | Attending: Hematology

## 2022-03-04 DIAGNOSIS — Z780 Asymptomatic menopausal state: Secondary | ICD-10-CM | POA: Diagnosis not present

## 2022-03-04 DIAGNOSIS — R970 Elevated carcinoembryonic antigen [CEA]: Secondary | ICD-10-CM | POA: Diagnosis not present

## 2022-03-04 DIAGNOSIS — Z9049 Acquired absence of other specified parts of digestive tract: Secondary | ICD-10-CM | POA: Insufficient documentation

## 2022-03-04 DIAGNOSIS — Z862 Personal history of diseases of the blood and blood-forming organs and certain disorders involving the immune mechanism: Secondary | ICD-10-CM | POA: Insufficient documentation

## 2022-03-04 DIAGNOSIS — C183 Malignant neoplasm of hepatic flexure: Secondary | ICD-10-CM

## 2022-03-04 DIAGNOSIS — Z85038 Personal history of other malignant neoplasm of large intestine: Secondary | ICD-10-CM | POA: Insufficient documentation

## 2022-03-04 DIAGNOSIS — Z8619 Personal history of other infectious and parasitic diseases: Secondary | ICD-10-CM | POA: Diagnosis not present

## 2022-03-04 LAB — CBC WITH DIFFERENTIAL/PLATELET
Abs Immature Granulocytes: 0.01 10*3/uL (ref 0.00–0.07)
Basophils Absolute: 0.1 10*3/uL (ref 0.0–0.1)
Basophils Relative: 1 %
Eosinophils Absolute: 0.1 10*3/uL (ref 0.0–0.5)
Eosinophils Relative: 2 %
HCT: 39 % (ref 36.0–46.0)
Hemoglobin: 12.8 g/dL (ref 12.0–15.0)
Immature Granulocytes: 0 %
Lymphocytes Relative: 31 %
Lymphs Abs: 1.4 10*3/uL (ref 0.7–4.0)
MCH: 30.6 pg (ref 26.0–34.0)
MCHC: 32.8 g/dL (ref 30.0–36.0)
MCV: 93.3 fL (ref 80.0–100.0)
Monocytes Absolute: 0.4 10*3/uL (ref 0.1–1.0)
Monocytes Relative: 10 %
Neutro Abs: 2.5 10*3/uL (ref 1.7–7.7)
Neutrophils Relative %: 56 %
Platelets: 156 10*3/uL (ref 150–400)
RBC: 4.18 MIL/uL (ref 3.87–5.11)
RDW: 13.1 % (ref 11.5–15.5)
WBC: 4.5 10*3/uL (ref 4.0–10.5)
nRBC: 0 % (ref 0.0–0.2)

## 2022-03-04 LAB — COMPREHENSIVE METABOLIC PANEL
ALT: 24 U/L (ref 0–44)
AST: 25 U/L (ref 15–41)
Albumin: 4.1 g/dL (ref 3.5–5.0)
Alkaline Phosphatase: 70 U/L (ref 38–126)
Anion gap: 8 (ref 5–15)
BUN: 17 mg/dL (ref 8–23)
CO2: 27 mmol/L (ref 22–32)
Calcium: 9.3 mg/dL (ref 8.9–10.3)
Chloride: 106 mmol/L (ref 98–111)
Creatinine, Ser: 0.73 mg/dL (ref 0.44–1.00)
GFR, Estimated: >60 mL/min (ref >60)
Glucose, Bld: 88 mg/dL (ref 70–99)
Potassium: 3.8 mmol/L (ref 3.5–5.1)
Sodium: 141 mmol/L (ref 135–145)
Total Bilirubin: 1 mg/dL (ref 0.3–1.2)
Total Protein: 7.6 g/dL (ref 6.5–8.1)

## 2022-03-05 LAB — CEA: CEA: 6.3 ng/mL — ABNORMAL HIGH (ref 0.0–4.7)

## 2022-03-11 ENCOUNTER — Inpatient Hospital Stay (HOSPITAL_BASED_OUTPATIENT_CLINIC_OR_DEPARTMENT_OTHER): Payer: Medicare Other | Admitting: Hematology

## 2022-03-11 VITALS — BP 116/77 | HR 62 | Temp 98.4°F | Resp 17 | Ht 65.5 in | Wt 146.3 lb

## 2022-03-11 DIAGNOSIS — Z9049 Acquired absence of other specified parts of digestive tract: Secondary | ICD-10-CM | POA: Diagnosis not present

## 2022-03-11 DIAGNOSIS — Z85038 Personal history of other malignant neoplasm of large intestine: Secondary | ICD-10-CM | POA: Diagnosis not present

## 2022-03-11 DIAGNOSIS — C183 Malignant neoplasm of hepatic flexure: Secondary | ICD-10-CM

## 2022-03-11 DIAGNOSIS — R970 Elevated carcinoembryonic antigen [CEA]: Secondary | ICD-10-CM | POA: Diagnosis not present

## 2022-03-11 DIAGNOSIS — Z862 Personal history of diseases of the blood and blood-forming organs and certain disorders involving the immune mechanism: Secondary | ICD-10-CM | POA: Diagnosis not present

## 2022-03-11 DIAGNOSIS — Z780 Asymptomatic menopausal state: Secondary | ICD-10-CM | POA: Diagnosis not present

## 2022-03-11 DIAGNOSIS — Z8619 Personal history of other infectious and parasitic diseases: Secondary | ICD-10-CM | POA: Diagnosis not present

## 2022-03-11 NOTE — Patient Instructions (Signed)
[facility]  Discharge Instructions  You were seen and examined today by Dr. Delton Coombes.  Dr. Delton Coombes discussed your most recent lab work which revealed that everything looks good.  Follow-up as scheduled in 6 months with labs and scan.    Thank you for choosing [facility] to provide your oncology and hematology care.   To afford each patient quality time with our provider, please arrive at least 15 minutes before your scheduled appointment time. You may need to reschedule your appointment if you arrive late (10 or more minutes). Arriving late affects you and other patients whose appointments are after yours.  Also, if you miss three or more appointments without notifying the office, you may be dismissed from the clinic at the provider's discretion.    Again, thank you for choosing [facility] [address]  Our hope is that these requests will decrease the amount of time that you wait before being seen by our physicians.   If you have a lab appointment with the [facility] please come in thru the Main Entrance and check in at the main information desk.           _____________________________________________________________  Should you have questions after your visit to [facility], please contact our office at [phone] and follow the prompts.  Our office hours are 8:00 a.m. to 4:30 p.m. Monday - Thursday and 8:00 a.m. to 2:30 p.m. Friday.  Please note that voicemails left after 4:00 p.m. may not be returned until the following business day.  We are closed weekends and all major holidays.  You do have access to a nurse 24-7, just call the main number to the clinic [phone] and do not press any options, hold on the line and a nurse will answer the phone.    For prescription refill requests, have your pharmacy contact our office and allow 72 hours.    Masks are optional in the cancer centers. If you would like  for your care team to wear a mask while they are taking care of you, please let them know. You may have one support person who is at least 68 years old accompany you for your appointments.

## 2022-03-12 NOTE — Progress Notes (Signed)
[place], [place] [address]   CLINIC:  Medical Oncology/Hematology  PCP:  Sharilyn Sites, [place] / [place] [address] [phone]   REASON FOR VISIT:  Follow-up for stage II colon cancer  PRIOR THERAPY: Right colon segmental resection on 06/15/2017  NGS Results: not done  CURRENT THERAPY: surveillance  BRIEF ONCOLOGIC HISTORY:  Oncology History  Primary cancer of hepatic flexure of colon ([facility])  06/15/2017 Initial Diagnosis   Primary cancer of hepatic flexure of colon ([place])   06/15/2017 Cancer Staging   Staging form: Colon and Rectum - Neuroendocine Tumors, AJCC 8th Edition - Clinical stage from 06/15/2017: Stage IIA (cT2, cN0, cM0) - Signed by Derek Jack, MD on 08/18/2017     CANCER STAGING:  Cancer Staging  Primary cancer of hepatic flexure of colon [facility]) Staging form: Colon and Rectum - Neuroendocine Tumors, AJCC 8th Edition - Clinical stage from 06/15/2017: Stage IIA (cT2, cN0, cM0) - Signed by Derek Jack, MD on 08/18/2017   INTERVAL HISTORY:  [name], a 66 y.o. female, seen for follow-up of stage II colon cancer.  She denies any change in bowel habits.  Denies any bleeding per rectum or melena.  Energy levels are 90%.  REVIEW OF SYSTEMS:  Review of Systems  Constitutional:  Negative for appetite change and fatigue.  Gastrointestinal:  Negative for abdominal pain, blood in stool, constipation and diarrhea.  Genitourinary:  Negative for hematuria.   Psychiatric/Behavioral:  Positive for sleep disturbance.   All other systems reviewed and are negative.   PAST MEDICAL/SURGICAL HISTORY:  Past Medical History:  Diagnosis Date   Cirrhosis of liver ([place]) 01/26/2011   FIBROSIS on liver biopsy in 2007, U/S  03/31/2013= mild diffuse hepatic steatosis and /or hepatocellular disease without focal hepatic parenchymal abnormality. per Dr. Patsy Baltimore (2012) pt is immune to hep A and Hep B. per  12/06/13 note from Liver clinic-pt had MRI which showed cirrhosis 2015.   Diverticulitis of colon    Elevated AFP 01/26/2011   Gall stones    Hematuria 12/12/2014   Hemorrhoid 12/01/2012   Hemorrhoids    Hepatitis C 01/26/2011   2005 non responder, genotype 1, Gr 1-2, Stage 3 . Treated with Harvoni at the Liver clinic, responder   Hidradenitis    History of hiatal hernia    History of kidney stones    Hx: UTI (urinary tract infection)    Kidney stones 10/2011   Osteoporosis    Palpitations    Primary cancer of hepatic flexure of colon ([place]) 06/15/2017   Serrated adenoma of colon 11/2011   Splenomegaly 01/26/2011   Thrombocytopenia ([place]) 01/26/2011   Tubular adenoma    Vulval lesion 12/14/2013   Has kissing lesions thin and puffy and paler i color about 5-6- mm in diameter.Dr Glo Herring in to co examine will try temovate and recheck in 3 months   Past Surgical History:  Procedure Laterality Date   AXILLARY SURGERY Bilateral 1979   "glands removed"   BREAST BIOPSY Right 2011   BREAST EXCISIONAL BIOPSY Bilateral 25 years ago   Benign, fatty tumors   CESAREAN SECTION     x 2   COLECTOMY  06/15/2017   LAPAROSCOPIC ASSISTED RIGHT COLECTOMY, ERAS PATHWAY/notes 06/15/2017   COLONOSCOPY  07/31/2005   [facility]-Normal rectum/Normal colonoscopy/Repeat screening colonoscopy ten years   COLONOSCOPY  11/16/2011   RMR: Friable anorectum-likely source of hematochezia/ LARGE 1.2x2.5CM SERRATED ADENOMA resected from ascending colon 7CM DISTAL TO ICV,  hyperplastic polyp removed    COLONOSCOPY N/A 07/18/2012   Dr. Gala Romney- normal appearing rectal mucosa. no residual polpy tissue seen at tatoo location. remainder of colonic mucosa appeared entirely normal. bx = tubular adenoma   COLONOSCOPY N/A 08/29/2015   Dr. Gala Romney: single 12 mm polyp in ascending colon, 4 mm polyp ascending. Sessile serrated adenoma. 3 year surveillance   COLONOSCOPY N/A 05/19/2017   4X2 cm sessile lesion at hepatic flexdure, unable to be  completely removed   COLONOSCOPY N/A 01/18/2019   Dr. Gala Romney: Several small polyps removed, hyperplastic.  Next colonoscopy in 5 years.   ESOPHAGEAL BANDING N/A 08/29/2015   Procedure: ESOPHAGEAL BANDING;  Surgeon: Daneil Dolin, MD;  [facility];  Service: Endoscopy;  Laterality: N/A;   ESOPHAGOGASTRODUODENOSCOPY N/A 08/29/2015   Dr. Gala Romney: non-critical Schatzki's ring, somewhat prominent small submucosal vessels without obvious varices. hyperplastic gastric polyp. 2 year surveillance   EXTRACORPOREAL SHOCK WAVE LITHOTRIPSY Right 05/27/2021   Procedure: EXTRACORPOREAL SHOCK WAVE LITHOTRIPSY (ESWL);  Surgeon: Cleon Gustin, MD;  Location: [facility];  Service: Urology;  Laterality: Right;   HEMORRHOID BANDING     LAPAROSCOPIC RIGHT COLECTOMY N/A 06/15/2017   Procedure: LAPAROSCOPIC ASSISTED RIGHT COLECTOMY, ERAS PATHWAY;  Surgeon: Fanny Skates, MD;  Location: [facility];  Service: General;  Laterality: N/A;   POLYPECTOMY  05/19/2017   Procedure: POLYPECTOMY;  Surgeon: Daneil Dolin, MD;  [facility];  Service: Endoscopy;;   POLYPECTOMY  01/18/2019   Procedure: POLYPECTOMY;  Surgeon: Daneil Dolin, MD;  [facility];  Service: Endoscopy;;   SKIN LESION EXCISION      SOCIAL HISTORY:  Social History   Socioeconomic History   Marital status: Married    Spouse name: Not on file   Number of children: 2   Years of education: Not on file   Highest education level: Not on file  Occupational History   Occupation: Social worker Op    Employer: [employer]  Tobacco Use   Smoking status: Never   Smokeless tobacco: Never  Vaping Use   Vaping Use: Never used  Substance and Sexual Activity   Alcohol use: No   Drug use: No   Sexual activity: Yes    Birth control/protection: Post-menopausal  Other Topics Concern   Not on file  Social History Narrative   Not on file   Social Determinants of Health   Financial Resource Strain: Low Risk  (01/07/2022)    Overall Financial Resource Strain (CARDIA)    Difficulty of Paying Living Expenses: Not hard at all  Food Insecurity: No Food Insecurity (04/05/2020)   Hunger Vital Sign    Worried About Running Out of Food in the Last Year: Never true    Ran Out of Food in the Last Year: Never true  Transportation Needs: No Transportation Needs (01/07/2022)   PRAPARE - Hydrologist (Medical): No    Lack of Transportation (Non-Medical): No  Physical Activity: Insufficiently Active (04/05/2020)   Exercise Vital Sign    Days of Exercise per Week: 2 days    Minutes of Exercise per Session: 50 min  Stress: No Stress Concern Present (04/05/2020)   [place]    Feeling of Stress : Not at all  Social Connections: [place] (04/05/2020)   Social Connection and Isolation Panel [NHANES]    Frequency of Communication with Friends and Family: More than three times a week  Frequency of Social Gatherings with Friends and Family: More than three times a week    Attends Religious Services: More than 4 times per year    Active Member of [employer] or Organizations: Yes    Attends Archivist Meetings: 1 to 4 times per year    Marital Status: Married  Human resources officer Violence: Not At Risk (04/05/2020)   Humiliation, Afraid, Rape, and Kick questionnaire    Fear of Current or Ex-Partner: No    Emotionally Abused: No    Physically Abused: No    Sexually Abused: No    FAMILY HISTORY:  Family History  Problem Relation Age of Onset   Heart attack Mother    Stroke Mother    Aneurysm Father    Colon cancer Neg Hx     CURRENT MEDICATIONS:  Current Outpatient Medications  Medication Sig Dispense Refill   Ascorbic Acid (VITAMIN C) 1000 MG tablet Take 1,000 mg by mouth daily.     augmented betamethasone dipropionate (DIPROLENE-AF) 0.05 % cream Apply topically as needed.     Cholecalciferol (VITAMIN D3) 50 MCG  (2000 UT) TABS Take 6,000 Units by mouth daily.     fluconazole (DIFLUCAN) 150 MG tablet Take one tablet by mouth, repeat in 72 hours for total of 2 doses. 2 tablet 0   fluticasone (CUTIVATE) 0.05 % cream Apply 1 application  topically 2 (two) times daily as needed (lichen planus).     fluticasone (FLONASE) 50 MCG/ACT nasal spray Place 1 spray into both nostrils daily as needed for allergies or rhinitis.     hydrocortisone (ANUSOL-HC) 25 MG suppository Place 1 suppository (25 mg total) rectally 2 (two) times daily. 14 suppository 1   Magnesium 250 MG TABS Take 1 tablet by mouth daily.     milk thistle 175 MG tablet Take 1,752 mg by mouth daily.     Multiple Vitamins-Minerals (HAIR/SKIN/NAILS/BIOTIN PO) Take 1 tablet by mouth daily.     OLIVE LEAF EXTRACT PO Take 1 tablet by mouth as needed.     ondansetron (ZOFRAN) 4 MG tablet Take 1 tablet (4 mg total) by mouth daily as needed for nausea or vomiting. 30 tablet 1   pantoprazole (PROTONIX) 40 MG tablet Take 40 mg by mouth daily. As needed     Probiotic CAPS Take 1 capsule by mouth daily.      Quercetin 250 MG TABS Take 500 mg by mouth.      tamsulosin (FLOMAX) 0.4 MG CAPS capsule Take 1 capsule (0.4 mg total) by mouth daily after supper. 30 capsule 0   VITAMIN A PO Take 1 tablet by mouth daily.      zinc gluconate 50 MG tablet Take 50 mg by mouth daily.     No current facility-administered medications for this visit.    ALLERGIES:  No Known Allergies  PHYSICAL EXAM:  Performance status (ECOG): 0 - Asymptomatic  Vitals:   03/11/22 1527  BP: 116/77  Pulse: 62  Resp: 17  Temp: 98.4 F (36.9 C)  SpO2: 100%   Wt Readings from Last 3 Encounters:  03/11/22 146 lb 4.8 oz (66.4 kg)  11/05/21 149 lb 3.2 oz (67.7 kg)  09/03/21 149 lb 6.4 oz (67.8 kg)   Physical Exam Vitals reviewed.  Constitutional:      Appearance: Normal appearance.  Cardiovascular:     Rate and Rhythm: Normal rate and regular rhythm.     Pulses: Normal pulses.      Heart sounds: Normal heart sounds.  Pulmonary:     Effort: Pulmonary effort is normal.     Breath sounds: Normal breath sounds.  Abdominal:     Palpations: Abdomen is soft. There is no hepatomegaly, splenomegaly or mass.     Tenderness: There is no abdominal tenderness.  Neurological:     General: No focal deficit present.     Mental Status: She is alert and oriented to person, place, and time.  Psychiatric:        Mood and Affect: Mood normal.        Behavior: Behavior normal.      LABORATORY DATA:  I have reviewed the labs as listed.     Latest Ref Rng & Units 03/04/2022    1:05 PM 11/05/2021   10:49 AM 05/25/2021    1:34 AM  CBC  WBC 4.0 - 10.5 K/uL 4.5  3.9  6.1   Hemoglobin 12.0 - 15.0 g/dL 12.8  13.3  12.4   Hematocrit 36.0 - 46.0 % 39.0  40.2  38.8   Platelets 150 - 400 K/uL 156  156  129       Latest Ref Rng & Units 03/04/2022    1:05 PM 11/05/2021   10:49 AM 05/25/2021    1:34 AM  CMP  Glucose 70 - 99 mg/dL 88  97  150   BUN 8 - 23 mg/dL _0 Creatinine 0.44 - 1.00 mg/dL 0.73  0.65  0.84   Sodium 135 - 145 mmol/L 141  141  139   Potassium 3.5 - 5.1 mmol/L 3.8  3.8  3.8   Chloride 98 - 111 mmol/L 106  107  106   CO2 22 - 32 mmol/L _1 Calcium 8.9 - 10.3 mg/dL 9.3  9.1  8.8   Total Protein 6.5 - 8.1 g/dL 7.6  7.6  7.2   Total Bilirubin 0.3 - 1.2 mg/dL 1.0  0.7  0.0   Alkaline Phos 38 - 126 U/L 70  76  71   AST 15 - 41 U/L _2 ALT 0 - 44 U/L _3 DIAGNOSTIC IMAGING:  I have independently reviewed the scans and discussed with the patient. No results found.   ASSESSMENT:  1.  Stage II (T2N0) hepatic flexure colon adenocarcinoma: -Status post right colon segmental resection on 06/15/2017, 3.2 cm tumor, 0/12 lymph nodes positive.  MMR normal/MSI low. -CEA a month after surgery was elevated at 6.3.  CT of the chest did not show any metastatic disease. -PET/CT scan on 08/16/2017 did not show any evidence of recurrence or metastatic  disease.  Her CEA elevation is likely from her liver disease.  She has a history of hepatitis C and was treated for it. -CT scan of the abdomen pelvis on 02/23/2018 showed postoperative changes of partial right colectomy without definite evidence of local or metastatic disease.  There is mild narrowing in the region of hepatic flexure, presumably at surgical anastomosis. -CEA on 06/01/2018 has increased to 8.9.  Prior CEA 3 months ago was 7.7. -PET CT scan on 06/22/2018 showed no findings of recurrent or metastatic colon cancer.  Asymmetric palatine tonsillar activity, left greater than right. -CT of the abdomen pelvis on 01/11/2019 was negative for any recurrent or metastatic disease. -Patient had a colonoscopy on 01/18/2019 they found 2 polyps that were negative for malignancy. -CEA slightly elevated at 8.0.  We will  continue to monitor this closely. -Labs done on 05/31/2019 showed CEA level 8.8. -CT AP done on 09/05/2019 showed no evidence of recurrent malignancy. -Last colonoscopy was on 01/18/2019.   2.  Hepatitis C: -Patient had a blood transfusion before 1974 was told she contracted hepatitis C. -She was treated 5 years ago for hepatitis C.   PLAN:  1.  Stage II (T2N0) hepatic flexure colon adenocarcinoma: - She does not have any bleeding per rectum or change in bowel habits. - Reviewed labs today which showed normal LFTs and CBC.  CEA was 6.3. - Slightly elevated CEA could be from underlying hep C liver disease. - Recommend RTC 6 months for follow-up with repeat CT AP and labs including CEA.  This will be her last imaging.   2.  Hepatitis C: - She was treated for hep C in the past.   Orders placed this encounter:  Orders Placed This Encounter  Procedures   CT Abdomen Pelvis W Contrast   CBC with Differential/Platelet   Comprehensive metabolic panel   CEA      Derek Jack, MD [facility] [phone]

## 2022-03-28 DIAGNOSIS — J101 Influenza due to other identified influenza virus with other respiratory manifestations: Secondary | ICD-10-CM | POA: Diagnosis not present

## 2022-03-28 DIAGNOSIS — Z6824 Body mass index (BMI) 24.0-24.9, adult: Secondary | ICD-10-CM | POA: Diagnosis not present

## 2022-03-29 ENCOUNTER — Ambulatory Visit: Payer: Medicare Other

## 2022-03-31 DIAGNOSIS — J111 Influenza due to unidentified influenza virus with other respiratory manifestations: Secondary | ICD-10-CM | POA: Diagnosis not present

## 2022-03-31 DIAGNOSIS — J45909 Unspecified asthma, uncomplicated: Secondary | ICD-10-CM | POA: Diagnosis not present

## 2022-04-01 ENCOUNTER — Encounter: Payer: Self-pay | Admitting: Cardiovascular Disease

## 2022-04-01 ENCOUNTER — Telehealth: Payer: Self-pay | Admitting: Cardiovascular Disease

## 2022-04-01 NOTE — Telephone Encounter (Signed)
Pt c/o Shortness Of Breath: STAT if SOB developed within the last 24 hours or pt is noticeably SOB on the phone  1. Are you currently SOB (can you hear that pt is SOB on the phone)? No   2. How long have you been experiencing SOB? Has been going on for about a month.  Was DX with the flu on Saturday.   3. Are you SOB when sitting or when up moving around? Mostly when she is up moving around.    4. Are you currently experiencing any other symptoms? No other symptoms

## 2022-04-01 NOTE — Telephone Encounter (Signed)
Patient diagnosed with flu after watching grandchild. Was seen in Urgent care but was not given tamaflu. Saw Dr.Golding yesterday and was given tamiflu.Today she still has some cough. I advised her to f/u with Dr.Golding, she agrees.

## 2022-04-01 NOTE — Telephone Encounter (Signed)
error 

## 2022-04-02 ENCOUNTER — Other Ambulatory Visit (HOSPITAL_COMMUNITY): Payer: Self-pay | Admitting: Family Medicine

## 2022-04-02 DIAGNOSIS — Z1231 Encounter for screening mammogram for malignant neoplasm of breast: Secondary | ICD-10-CM

## 2022-04-22 ENCOUNTER — Encounter: Payer: Self-pay | Admitting: Adult Health

## 2022-04-22 ENCOUNTER — Ambulatory Visit (INDEPENDENT_AMBULATORY_CARE_PROVIDER_SITE_OTHER): Payer: Medicare Other | Admitting: Adult Health

## 2022-04-22 ENCOUNTER — Other Ambulatory Visit (HOSPITAL_COMMUNITY)
Admission: RE | Admit: 2022-04-22 | Discharge: 2022-04-22 | Disposition: A | Discharge status: Home / Self Care | Payer: Medicare Other | Source: Ambulatory Visit | Attending: Adult Health | Admitting: Adult Health

## 2022-04-22 VITALS — BP 98/66 | HR 64 | Ht 65.5 in | Wt 145.0 lb

## 2022-04-22 DIAGNOSIS — Z01419 Encounter for gynecological examination (general) (routine) without abnormal findings: Secondary | ICD-10-CM | POA: Diagnosis not present

## 2022-04-22 DIAGNOSIS — E119 Type 2 diabetes mellitus without complications: Secondary | ICD-10-CM | POA: Diagnosis not present

## 2022-04-22 DIAGNOSIS — Z0000 Encounter for general adult medical examination without abnormal findings: Secondary | ICD-10-CM | POA: Diagnosis not present

## 2022-04-22 DIAGNOSIS — Z85038 Personal history of other malignant neoplasm of large intestine: Secondary | ICD-10-CM | POA: Diagnosis not present

## 2022-04-22 DIAGNOSIS — E559 Vitamin D deficiency, unspecified: Secondary | ICD-10-CM | POA: Diagnosis not present

## 2022-04-22 DIAGNOSIS — Z1151 Encounter for screening for human papillomavirus (HPV): Secondary | ICD-10-CM | POA: Insufficient documentation

## 2022-04-22 DIAGNOSIS — B182 Chronic viral hepatitis C: Secondary | ICD-10-CM | POA: Diagnosis not present

## 2022-04-22 DIAGNOSIS — K7460 Unspecified cirrhosis of liver: Secondary | ICD-10-CM | POA: Diagnosis not present

## 2022-04-22 DIAGNOSIS — E7849 Other hyperlipidemia: Secondary | ICD-10-CM | POA: Diagnosis not present

## 2022-04-22 DIAGNOSIS — Z6822 Body mass index (BMI) 22.0-22.9, adult: Secondary | ICD-10-CM | POA: Diagnosis not present

## 2022-04-22 DIAGNOSIS — Z1211 Encounter for screening for malignant neoplasm of colon: Secondary | ICD-10-CM | POA: Diagnosis not present

## 2022-04-22 DIAGNOSIS — E782 Mixed hyperlipidemia: Secondary | ICD-10-CM | POA: Diagnosis not present

## 2022-04-22 DIAGNOSIS — Z1331 Encounter for screening for depression: Secondary | ICD-10-CM | POA: Diagnosis not present

## 2022-04-22 DIAGNOSIS — D509 Iron deficiency anemia, unspecified: Secondary | ICD-10-CM | POA: Diagnosis not present

## 2022-04-22 DIAGNOSIS — D696 Thrombocytopenia, unspecified: Secondary | ICD-10-CM | POA: Diagnosis not present

## 2022-04-22 DIAGNOSIS — R7309 Other abnormal glucose: Secondary | ICD-10-CM | POA: Diagnosis not present

## 2022-04-22 LAB — HEMOCCULT GUIAC POC 1CARD (OFFICE): Fecal Occult Blood, POC: NEGATIVE

## 2022-04-22 NOTE — Progress Notes (Signed)
Patient ID: Ariana Long, female   DOB: 1955-10-29, 66 y.o.   MRN: [id number] History of Present Illness: Ariana Long is a 66 year old black female,married, PM in for a well woman gyn exam and pap. She is still seeing Dr Helene Shoe colon cancer 2019.  PCP is Dr Hilma Favors   Current Medications, Allergies, Past Medical History, Past Surgical History, Family History and Social History were reviewed in [facility] record.     Review of Systems: Patient denies any headaches, hearing loss, fatigue, blurred vision, shortness of breath, chest pain, abdominal pain, problems with bowel movements, urination, or intercourse. No joint pain or mood swings.  Denies any vaginal bleeding    Physical Exam:BP 98/66 (BP Location: Left Arm, Patient Position: Sitting, Cuff Size: Normal)   Pulse 64   Ht 5' 5.5" (1.664 m)   Wt 145 lb (65.8 kg)   BMI 23.76 kg/m   General:  Well developed, well nourished, no acute distress Skin:  Warm and dry Neck:  Midline trachea, normal thyroid, good ROM, no lymphadenopathy,no carotid bruits heard Lungs; Clear to auscultation bilaterally Breast:  No dominant palpable mass, retraction, or nipple discharge Cardiovascular: Regular rate and rhythm Abdomen:  Soft, non tender, no hepatosplenomegaly Pelvic:  External genitalia has thickened skin,(has lichen planus) has cream.  The vagina is pale. Urethra has no lesions or masses. The cervix is smooth, pap with HR HPV genotyping performed.  Uterus is felt to be normal size, shape, and contour.  No adnexal masses or tenderness noted.Bladder is non tender, no masses felt. Rectal: Good sphincter tone, no polyps, or hemorrhoids felt.  Hemoccult negative. Extremities/musculoskeletal:  No swelling or varicosities noted, no clubbing or cyanosis Psych:  No mood changes, alert and cooperative,seems happy AA is 0 Fall risk is low    04/22/2022    2:37 PM 04/05/2020   11:26 AM 01/27/2017   12:20 PM  Depression screen PHQ 2/9   Decreased Interest 0 0 0  Down, Depressed, Hopeless 0 0 0  PHQ - 2 Score 0 0 0  Altered sleeping 0 0   Tired, decreased energy 0 0   Change in appetite 0 0   Feeling bad or failure about yourself  0 0   Trouble concentrating 0 0   Moving slowly or fidgety/restless 0 0   Suicidal thoughts 0 0   PHQ-9 Score 0 0        04/22/2022    2:43 PM 04/05/2020   11:26 AM  GAD 7 : Generalized Anxiety Score  Nervous, Anxious, on Edge 0 0  Control/stop worrying 0 0  Worry too much - different things 0 0  Trouble relaxing 0 0  Restless 0 0  Easily annoyed or irritable 1 0  Afraid - awful might happen 0 0  Total GAD 7 Score 1 0    Upstream - 04/22/22 1435       Pregnancy Intention Screening   Does the patient want to become pregnant in the next year? N/A    Does the patient's partner want to become pregnant in the next year? N/A    Would the patient like to discuss contraceptive options today? N/A      Contraception Wrap Up   Current Method No Method - Other Reason   postmenopausal   Reason for No Current Contraceptive Method at Intake ([facility] Only) Other    End Method No Method - Other Reason   postmenopausal   Contraception Counseling Provided No  Examination chaperoned by Levy Pupa LPN  Impression and Plan: 1. Encounter for routine gynecological examination with Papanicolaou smear of cervix Pap sent Pap and physical in 2 years, may be last pap then Has mammogram scheduled for 05/06/22 Labs with PCP or Dr Raliegh Ip Colonoscopy per GI  2. Encounter for screening fecal occult blood testing Hemoccult was negative   3. History of colon cancer

## 2022-04-23 ENCOUNTER — Telehealth: Payer: Self-pay | Admitting: Adult Health

## 2022-04-23 NOTE — Telephone Encounter (Signed)
Ariana Long actually came by office, had some blood from rectum when wiped yesterday. She had physical and pap  yesterday. Then this morning has some blood in panties. May have been from exam, call me next week in follow up. And she agrees.

## 2022-04-24 ENCOUNTER — Other Ambulatory Visit (HOSPITAL_COMMUNITY): Payer: Self-pay | Admitting: Internal Medicine

## 2022-04-24 DIAGNOSIS — E2839 Other primary ovarian failure: Secondary | ICD-10-CM

## 2022-04-28 LAB — CYTOLOGY - PAP
Comment: NEGATIVE
Diagnosis: NEGATIVE
High risk HPV: NEGATIVE

## 2022-05-06 ENCOUNTER — Inpatient Hospital Stay (HOSPITAL_COMMUNITY): Admission: RE | Admit: 2022-05-06 | Payer: Medicare Other | Source: Ambulatory Visit

## 2022-05-06 ENCOUNTER — Other Ambulatory Visit (HOSPITAL_COMMUNITY): Payer: Medicare Other

## 2022-05-08 ENCOUNTER — Ambulatory Visit (HOSPITAL_COMMUNITY)
Admission: RE | Admit: 2022-05-08 | Discharge: 2022-05-08 | Disposition: A | Discharge status: Home / Self Care | Payer: Medicare Other | Source: Ambulatory Visit | Attending: Family Medicine | Admitting: Family Medicine

## 2022-05-08 DIAGNOSIS — Z1231 Encounter for screening mammogram for malignant neoplasm of breast: Secondary | ICD-10-CM | POA: Insufficient documentation

## 2022-05-13 ENCOUNTER — Other Ambulatory Visit (HOSPITAL_COMMUNITY): Payer: Medicare Other

## 2022-05-27 ENCOUNTER — Ambulatory Visit (INDEPENDENT_AMBULATORY_CARE_PROVIDER_SITE_OTHER): Payer: Medicare Other | Admitting: Gastroenterology

## 2022-05-27 ENCOUNTER — Telehealth: Payer: Self-pay | Admitting: *Deleted

## 2022-05-27 ENCOUNTER — Encounter: Payer: Self-pay | Admitting: Gastroenterology

## 2022-05-27 ENCOUNTER — Other Ambulatory Visit (HOSPITAL_COMMUNITY)
Admission: RE | Admit: 2022-05-27 | Discharge: 2022-05-27 | Disposition: A | Discharge status: Home / Self Care | Payer: Medicare Other | Source: Ambulatory Visit | Attending: Gastroenterology | Admitting: Gastroenterology

## 2022-05-27 VITALS — BP 99/64 | HR 61 | Temp 96.9°F | Ht 65.0 in | Wt 142.8 lb

## 2022-05-27 DIAGNOSIS — Z85038 Personal history of other malignant neoplasm of large intestine: Secondary | ICD-10-CM

## 2022-05-27 DIAGNOSIS — K7460 Unspecified cirrhosis of liver: Secondary | ICD-10-CM | POA: Diagnosis not present

## 2022-05-27 DIAGNOSIS — K219 Gastro-esophageal reflux disease without esophagitis: Secondary | ICD-10-CM

## 2022-05-27 DIAGNOSIS — K649 Unspecified hemorrhoids: Secondary | ICD-10-CM | POA: Diagnosis not present

## 2022-05-27 LAB — COMPREHENSIVE METABOLIC PANEL
ALT: 24 U/L (ref 0–44)
AST: 27 U/L (ref 15–41)
Albumin: 4.2 g/dL (ref 3.5–5.0)
Alkaline Phosphatase: 69 U/L (ref 38–126)
Anion gap: 8 (ref 5–15)
BUN: 18 mg/dL (ref 8–23)
CO2: 26 mmol/L (ref 22–32)
Calcium: 9.5 mg/dL (ref 8.9–10.3)
Chloride: 103 mmol/L (ref 98–111)
Creatinine, Ser: 0.76 mg/dL (ref 0.44–1.00)
GFR, Estimated: >60 mL/min (ref >60)
Glucose, Bld: 97 mg/dL (ref 70–99)
Potassium: 4 mmol/L (ref 3.5–5.1)
Sodium: 137 mmol/L (ref 135–145)
Total Bilirubin: 0.5 mg/dL (ref 0.3–1.2)
Total Protein: 7.9 g/dL (ref 6.5–8.1)

## 2022-05-27 LAB — PROTIME-INR
INR: 1.1 (ref 0.8–1.2)
Prothrombin Time: 13.8 seconds (ref 11.4–15.2)

## 2022-05-27 LAB — CBC
HCT: 40.4 % (ref 36.0–46.0)
Hemoglobin: 13.2 g/dL (ref 12.0–15.0)
MCH: 30.4 pg (ref 26.0–34.0)
MCHC: 32.7 g/dL (ref 30.0–36.0)
MCV: 93.1 fL (ref 80.0–100.0)
Platelets: 155 10*3/uL (ref 150–400)
RBC: 4.34 MIL/uL (ref 3.87–5.11)
RDW: 13.3 % (ref 11.5–15.5)
WBC: 3.6 10*3/uL — ABNORMAL LOW (ref 4.0–10.5)
nRBC: 0 % (ref 0.0–0.2)

## 2022-05-27 NOTE — Progress Notes (Signed)
GI Office Note    Referring Provider: Sharilyn Sites, MD Primary Care Physician:  Sharilyn Sites, MD Primary Gastroenterologist: Cristopher Estimable.Rourk, MD  Date:  05/27/2022  ID:  Ariana Long, DOB [birth year], MRN [id number]   Chief Complaint   Chief Complaint  Patient presents with   Follow-up    History of Present Illness  Ariana Long is a 67 y.o. female with a history of stage II colon cancer involving the hepatic flexure and February 2019 s/p right segmental resection, hemorrhoids, hepatitis C s/p eradication, and cirrhosis with liver biopsy in 2007 presenting today for follow-up.  EGD 2017 without esophageal varices, she was recommended to have 2-year surveillance but requested to postpone procedure.   Last colonoscopy September 2020 with several hyperplastic polyps removed (repeat in 5 years).  Previously did not tolerate hemorrhoid banding.   CT abdomen pelvis 08/20/2021:  -No suspicious hepatic lesions -cholelithiasis without findings of acute cholecystitis -no biliary ductal dilation -normal pancreas and spleen,  -small hiatal hernia -stable postsurgical change of partial right hemicolectomy without findings suspicious for recurrent tumor.  Last seen in the office 11/05/2021.  Reported a growth getting bigger and becoming inflamed and was having a lot of itching but she had been soaking with warm water in the past and pain has stopped.  Reported lichen planus and also complained of yeast infection.  Had reported some toilet tissue hematochezia previously.  Denied abdominal pain, melena.  Requested Anusol suppositories over cream for management of hemorrhoids.  MELD labs ordered.  Placed on recall for rectal quadrant ultrasound in 3 months.  Advise continue pantoprazole 40 mg daily.  Anusol suppositories provided.  Due for colonoscopy in 2025, plan for EGD at same time for variceal screening (patient request).  Labs 11/05/21: Hemoglobin 13.3, platelets 156.  Sodium 141 creatinine  0.65, albumin 4, AST 29, ALT 28, alk phos 76, T. bili 0.7.  INR 1  Labs 03/11/22: Hemoglobin 12.8, platelets 156.  CMP within normal limits.  CEA 6.3 (6.7)  Today: Cirrhosis history Hematemesis/coffee ground emesis: No History of variceal bleeding: No Abdominal pain: No Abdominal distention/worsening ascites: No Fever/chills:  No Episodes of confusion/disorientation: None Number of daily bowel movements: 1-2 (doing fasting) Taking diuretics?: none Date of last EGD: 2017 (pt requests to wait until colonoscopy) Prior history of banding?:  Prior episodes of SBP: None Last time liver imaging was performed: April 2023 Pruritus: No itching.  MELD score: 6 (July 2023)  GERD: Only taking as needed. Sometimes does water and vinegar for relief instead. Watching what she eats. Denies dysphagia.   Hemorrhoids: Went to GYN. Did exam. After the next day she has a little bleeding but  it did not continue. Denies any straining.    Current Outpatient Medications  Medication Sig Dispense Refill   Ascorbic Acid (VITAMIN C) 1000 MG tablet Take 1,000 mg by mouth daily.     Cholecalciferol (VITAMIN D3) 50 MCG (2000 UT) TABS Take 6,000 Units by mouth daily.     Magnesium 250 MG TABS Take 1 tablet by mouth daily.     milk thistle 175 MG tablet Take 1,752 mg by mouth.     Multiple Vitamins-Minerals (HAIR/SKIN/NAILS/BIOTIN PO) Take 1 tablet by mouth daily.     OLIVE LEAF EXTRACT PO Take 1 tablet by mouth as needed.     Quercetin 250 MG TABS Take 500 mg by mouth.      zinc gluconate 50 MG tablet Take 50 mg by mouth daily.  augmented betamethasone dipropionate (DIPROLENE-AF) 0.05 % cream Apply topically as needed. (Patient not taking: Reported on 05/27/2022)     fluticasone (CUTIVATE) 0.05 % cream Apply 1 application  topically 2 (two) times daily as needed (lichen planus). (Patient not taking: Reported on 05/27/2022)     fluticasone (FLONASE) 50 MCG/ACT nasal spray Place 1 spray into both nostrils daily  as needed for allergies or rhinitis. (Patient not taking: Reported on 05/27/2022)     hydrocortisone (ANUSOL-HC) 25 MG suppository Place 1 suppository (25 mg total) rectally 2 (two) times daily. (Patient not taking: Reported on 05/27/2022) 14 suppository 1   pantoprazole (PROTONIX) 40 MG tablet Take 40 mg by mouth daily. As needed (Patient not taking: Reported on 05/27/2022)     Probiotic CAPS Take 1 capsule by mouth daily.  (Patient not taking: Reported on 05/27/2022)     VITAMIN A PO Take 1 tablet by mouth daily.  (Patient not taking: Reported on 05/27/2022)     No current facility-administered medications for this visit.    Past Medical History:  Diagnosis Date   Cirrhosis of liver ([place]) 01/26/2011   FIBROSIS on liver biopsy in 2007, U/S  03/31/2013= mild diffuse hepatic steatosis and /or hepatocellular disease without focal hepatic parenchymal abnormality. per Dr. Patsy [place] (2012) pt is immune to hep A and Hep B. per 12/06/13 note from Liver clinic-pt had MRI which showed cirrhosis 2015.   Diverticulitis of colon    Elevated AFP 01/26/2011   Gall stones    Hematuria 12/12/2014   Hemorrhoid 12/01/2012   Hemorrhoids    Hepatitis C 01/26/2011   2005 non responder, genotype 1, Gr 1-2, Stage 3 . Treated with Harvoni at the Liver clinic, responder   Hidradenitis    History of hiatal hernia    History of kidney stones    Hx: UTI (urinary tract infection)    Kidney stones 10/2011   Osteoporosis    Palpitations    Pre-diabetes    Primary cancer of hepatic flexure of colon ([place]) 06/15/2017   Serrated adenoma of colon 11/2011   Splenomegaly 01/26/2011   Thrombocytopenia ([place]) 01/26/2011   Tubular adenoma    Vulval lesion 12/14/2013   Has kissing lesions thin and puffy and paler i color about 5-6- mm in diameter.Dr Glo Herring in to co examine will try temovate and recheck in 3 months    Past Surgical History:  Procedure Laterality Date   AXILLARY SURGERY Bilateral 1979   "glands removed"    BREAST BIOPSY Right 2011   BREAST EXCISIONAL BIOPSY Bilateral 25 years ago   Benign, fatty tumors   CESAREAN SECTION     x 2   COLECTOMY  06/15/2017   LAPAROSCOPIC ASSISTED RIGHT COLECTOMY, ERAS PATHWAY/notes 06/15/2017   COLONOSCOPY  07/31/2005   Rourk-Normal rectum/Normal colonoscopy/Repeat screening colonoscopy ten years   COLONOSCOPY  11/16/2011   RMR: Friable anorectum-likely source of hematochezia/ LARGE 1.2x2.5CM SERRATED ADENOMA resected from ascending colon 7CM DISTAL TO ICV, hyperplastic polyp removed    COLONOSCOPY N/A 07/18/2012   Dr. Gala Romney- normal appearing rectal mucosa. no residual polpy tissue seen at tatoo location. remainder of colonic mucosa appeared entirely normal. bx = tubular adenoma   COLONOSCOPY N/A 08/29/2015   Dr. Gala Romney: single 12 mm polyp in ascending colon, 4 mm polyp ascending. Sessile serrated adenoma. 3 year surveillance   COLONOSCOPY N/A 05/19/2017   4X2 cm sessile lesion at hepatic flexdure, unable to be completely removed   COLONOSCOPY N/A 01/18/2019   Dr. Gala Romney: Several small polyps  removed, hyperplastic.  Next colonoscopy in 5 years.   ESOPHAGEAL BANDING N/A 08/29/2015   Procedure: ESOPHAGEAL BANDING;  Surgeon: Daneil Dolin, MD;  [facility];  Service: Endoscopy;  Laterality: N/A;   ESOPHAGOGASTRODUODENOSCOPY N/A 08/29/2015   Dr. Gala Romney: non-critical Schatzki's ring, somewhat prominent small submucosal vessels without obvious varices. hyperplastic gastric polyp. 2 year surveillance   EXTRACORPOREAL SHOCK WAVE LITHOTRIPSY Right 05/27/2021   Procedure: EXTRACORPOREAL SHOCK WAVE LITHOTRIPSY (ESWL);  Surgeon: Cleon Gustin, MD;  Location: [facility];  Service: Urology;  Laterality: Right;   HEMORRHOID BANDING     LAPAROSCOPIC RIGHT COLECTOMY N/A 06/15/2017   Procedure: LAPAROSCOPIC ASSISTED RIGHT COLECTOMY, ERAS PATHWAY;  Surgeon: Fanny Skates, MD;  Location: [facility];  Service: General;  Laterality: N/A;   POLYPECTOMY  05/19/2017   Procedure:  POLYPECTOMY;  Surgeon: Daneil Dolin, MD;  [facility]: [facility];  Service: Endoscopy;;   POLYPECTOMY  01/18/2019   Procedure: POLYPECTOMY;  Surgeon: Daneil Dolin, MD;  [facility];  Service: Endoscopy;;   SKIN LESION EXCISION      Family History  Problem Relation Age of Onset   Heart attack Mother    Stroke Mother    Aneurysm Father    Colon cancer Neg Hx     Allergies as of 05/27/2022   (No Known Allergies)    Social History   Socioeconomic History   Marital status: Married    Spouse name: Not on file   Number of children: 2   Years of education: Not on file   Highest education level: Not on file  Occupational History   Occupation: Social worker Op    Employer: [employer]  Tobacco Use   Smoking status: Never   Smokeless tobacco: Never  Vaping Use   Vaping Use: Never used  Substance and Sexual Activity   Alcohol use: No   Drug use: No   Sexual activity: Yes    Birth control/protection: Post-menopausal  Other Topics Concern   Not on file  Social History Narrative   Not on file   Social Determinants of Health   Financial Resource Strain: Low Risk  (04/22/2022)   Overall Financial Resource Strain (CARDIA)    Difficulty of Paying Living Expenses: Not hard at all  Food Insecurity: No Food Insecurity (04/22/2022)   Hunger Vital Sign    Worried About Running Out of Food in the Last Year: Never true    [place] in the Last Year: Never true  Transportation Needs: No Transportation Needs (04/22/2022)   PRAPARE - Hydrologist (Medical): No    Lack of Transportation (Non-Medical): No  Physical Activity: Insufficiently Active (04/22/2022)   Exercise Vital Sign    Days of Exercise per Week: 3 days    Minutes of Exercise per Session: 30 min  Stress: No Stress Concern Present (04/22/2022)   [place]    Feeling of Stress : Not at all  Social  Connections: [place] (04/22/2022)   Social Connection and Isolation Panel [NHANES]    Frequency of Communication with Friends and Family: More than three times a week    Frequency of Social Gatherings with Friends and Family: Once a week    Attends Religious Services: More than 4 times per year    Active Member of [employer] or Organizations: Yes    Attends Archivist Meetings: 1 to 4 times per year  Marital Status: Married     Review of Systems   Gen: Denies fever, chills, anorexia. Denies fatigue, weakness, weight loss.  CV: Denies chest pain, palpitations, syncope, peripheral edema, and claudication. Resp: Denies dyspnea at rest, cough, wheezing, coughing up blood, and pleurisy. GI: See HPI Derm: Denies rash, itching, dry skin Psych: Denies depression, anxiety, memory loss, confusion. No homicidal or suicidal ideation.  Heme: Denies bruising, bleeding, and enlarged lymph nodes.   Physical Exam   BP 99/64 (BP Location: Right Arm, Patient Position: Sitting, Cuff Size: Normal)   Pulse 61   Temp (!) 96.9 F (36.1 C) (Temporal)   Ht '5\' 5"'$  (1.651 m)   Wt 142 lb 12.8 oz (64.8 kg)   SpO2 100%   BMI 23.76 kg/m   General:   Alert and oriented. No distress noted. Pleasant and cooperative.  Head:  Normocephalic and atraumatic. Eyes:  Conjuctiva clear without scleral icterus. Mouth:  Oral mucosa pink and moist. Good dentition. No lesions. Lungs:  Clear to auscultation bilaterally. No wheezes, rales, or rhonchi. No distress.  Heart:  S1, S2 present without murmurs appreciated.  Abdomen:  +BS, soft, non-tender and non-distended. No rebound or guarding. No HSM or masses noted. Rectal: deferred Msk:  Symmetrical without gross deformities. Normal posture. Extremities:  Without edema. Neurologic:  Alert and  oriented x4 Psych:  Alert and cooperative. Normal mood and affect.   Assessment  Ariana Long is a 67 y.o. female with a history of stage II colon cancer  involving the hepatic flexure and February 2019 s/p right segmental resection, hemorrhoids, hepatitis C s/p eradication, and cirrhosis with liver biopsy in 2007 presenting today for follow-up.  GERD: Currently with occasional symptoms, using pantoprazole 40 mg as needed.  Cirrhosis, history of hepatitis C: Cirrhosis noted on MRI in 2015 and PET scan in 2019.  S/p eradication of hepatitis C.  EGD in 2017 without evidence of varices or portal hypertensive gastropathy.  Most recent LFTs normal in November.  Last MELD score 6, calculated as of labs from July 2023.  Currently overdue for liver imaging as last CT scan was in April 2023.  Will obtain right upper quadrant ultrasound for surveillance.  Will have CT A/P for oncology later this year.  Patient prefers to hold off on EGD for variceal screening until time of colonoscopy next year.  This is reasonable given no signs of decompensation or portal hypertension at this time.  We will recheck MELD labs today.  History of colon cancer: Continues to follow with Dr. Delton Coombes with oncology.  S/p resection in 2019.  Will have follow-up CT A/P later this year.  Last CT in April 2023 without recurrence.  Last colonoscopy in 2020.  Continues to have no alarm symptoms at this time.  Due for repeat colonoscopy in September 2025.  Hemorrhoids: Continues to have intermittent issues with hemorrhoids.  Did not previously tolerate banding in the past due to pain.  Having rare toilet tissue hematochezia.  Having 1-2 bowel movements daily, not having to strain.   PLAN   RUQ U/S CBC, CMP, INR, AFP Colonoscopy and EGD in 2025  Continue pantoprazole as needed Follow-up in 6 months    Venetia Night, MSN, FNP-BC, AGACNP-BC [facility]

## 2022-05-27 NOTE — Patient Instructions (Signed)
Our scheduler should be in contact with you regarding an appointment for your right upper quadrant ultrasound to further evaluate your liver.  I am ordering lab work for you today to have completed at your earliest convenience.  I will be in touch with results once they have been received and have had time to review them.  We will plan on colonoscopy and upper endoscopy next year for surveillance.  You may continue pantoprazole as needed.  It was a pleasure to see you again today!  I want to create trusting relationships with patients. If you receive a survey regarding your visit,  I greatly appreciate you taking time to fill this out on paper or through your MyChart. I value your feedback.  Venetia Night, MSN, FNP-BC, AGACNP-BC [facility]

## 2022-05-27 NOTE — Telephone Encounter (Signed)
Pt called back and is aware of appt details 

## 2022-05-27 NOTE — Telephone Encounter (Signed)
LMTRC  [place] RUQ scheduled for 06/10/22 at 8:30, arrive at 8:15 am, nothing to eat or drink after midnight.

## 2022-05-29 LAB — AFP TUMOR MARKER: AFP, Serum, Tumor Marker: 6.2 ng/mL (ref 0.0–9.2)

## 2022-06-02 ENCOUNTER — Ambulatory Visit (HOSPITAL_COMMUNITY): Payer: Medicare Other

## 2022-06-03 ENCOUNTER — Ambulatory Visit (HOSPITAL_COMMUNITY)
Admission: RE | Admit: 2022-06-03 | Discharge: 2022-06-03 | Disposition: A | Discharge status: Home / Self Care | Payer: Medicare Other | Source: Ambulatory Visit | Attending: Internal Medicine | Admitting: Internal Medicine

## 2022-06-03 DIAGNOSIS — M8589 Other specified disorders of bone density and structure, multiple sites: Secondary | ICD-10-CM | POA: Diagnosis not present

## 2022-06-03 DIAGNOSIS — Z780 Asymptomatic menopausal state: Secondary | ICD-10-CM | POA: Diagnosis not present

## 2022-06-03 DIAGNOSIS — E2839 Other primary ovarian failure: Secondary | ICD-10-CM | POA: Insufficient documentation

## 2022-06-10 ENCOUNTER — Ambulatory Visit (HOSPITAL_COMMUNITY): Payer: Medicare Other

## 2022-06-24 ENCOUNTER — Ambulatory Visit (HOSPITAL_COMMUNITY)
Admission: RE | Admit: 2022-06-24 | Discharge: 2022-06-24 | Disposition: A | Discharge status: Home / Self Care | Payer: Medicare Other | Source: Ambulatory Visit | Attending: Gastroenterology | Admitting: Gastroenterology

## 2022-06-24 DIAGNOSIS — L438 Other lichen planus: Secondary | ICD-10-CM | POA: Diagnosis not present

## 2022-06-24 DIAGNOSIS — K7460 Unspecified cirrhosis of liver: Secondary | ICD-10-CM | POA: Insufficient documentation

## 2022-06-24 DIAGNOSIS — K8020 Calculus of gallbladder without cholecystitis without obstruction: Secondary | ICD-10-CM | POA: Diagnosis not present

## 2022-07-28 DIAGNOSIS — M2042 Other hammer toe(s) (acquired), left foot: Secondary | ICD-10-CM | POA: Diagnosis not present

## 2022-07-28 DIAGNOSIS — L84 Corns and callosities: Secondary | ICD-10-CM | POA: Diagnosis not present

## 2022-07-28 DIAGNOSIS — M7742 Metatarsalgia, left foot: Secondary | ICD-10-CM | POA: Diagnosis not present

## 2022-07-28 DIAGNOSIS — M2041 Other hammer toe(s) (acquired), right foot: Secondary | ICD-10-CM | POA: Diagnosis not present

## 2022-08-05 ENCOUNTER — Ambulatory Visit (HOSPITAL_COMMUNITY)
Admission: RE | Admit: 2022-08-05 | Discharge: 2022-08-05 | Disposition: A | Discharge status: Home / Self Care | Payer: Medicare Other | Source: Ambulatory Visit | Attending: Physician Assistant | Admitting: Physician Assistant

## 2022-08-05 DIAGNOSIS — N200 Calculus of kidney: Secondary | ICD-10-CM | POA: Insufficient documentation

## 2022-08-19 DIAGNOSIS — H40013 Open angle with borderline findings, low risk, bilateral: Secondary | ICD-10-CM | POA: Diagnosis not present

## 2022-09-02 ENCOUNTER — Inpatient Hospital Stay: Payer: Medicare Other | Attending: Hematology

## 2022-09-02 ENCOUNTER — Ambulatory Visit (HOSPITAL_COMMUNITY)
Admission: RE | Admit: 2022-09-02 | Discharge: 2022-09-02 | Disposition: A | Discharge status: Home / Self Care | Payer: Medicare Other | Source: Ambulatory Visit | Attending: Hematology | Admitting: Hematology

## 2022-09-02 DIAGNOSIS — Z862 Personal history of diseases of the blood and blood-forming organs and certain disorders involving the immune mechanism: Secondary | ICD-10-CM | POA: Insufficient documentation

## 2022-09-02 DIAGNOSIS — Z85038 Personal history of other malignant neoplasm of large intestine: Secondary | ICD-10-CM | POA: Diagnosis not present

## 2022-09-02 DIAGNOSIS — R970 Elevated carcinoembryonic antigen [CEA]: Secondary | ICD-10-CM | POA: Insufficient documentation

## 2022-09-02 DIAGNOSIS — Z8619 Personal history of other infectious and parasitic diseases: Secondary | ICD-10-CM | POA: Diagnosis not present

## 2022-09-02 DIAGNOSIS — C183 Malignant neoplasm of hepatic flexure: Secondary | ICD-10-CM | POA: Insufficient documentation

## 2022-09-02 DIAGNOSIS — Z9049 Acquired absence of other specified parts of digestive tract: Secondary | ICD-10-CM | POA: Insufficient documentation

## 2022-09-02 DIAGNOSIS — N200 Calculus of kidney: Secondary | ICD-10-CM | POA: Diagnosis not present

## 2022-09-02 LAB — CBC WITH DIFFERENTIAL/PLATELET
Abs Immature Granulocytes: 0.01 10*3/uL (ref 0.00–0.07)
Basophils Absolute: 0.1 10*3/uL (ref 0.0–0.1)
Basophils Relative: 1 %
Eosinophils Absolute: 0.1 10*3/uL (ref 0.0–0.5)
Eosinophils Relative: 3 %
HCT: 41.5 % (ref 36.0–46.0)
Hemoglobin: 13.7 g/dL (ref 12.0–15.0)
Immature Granulocytes: 0 %
Lymphocytes Relative: 37 %
Lymphs Abs: 1.5 10*3/uL (ref 0.7–4.0)
MCH: 30.6 pg (ref 26.0–34.0)
MCHC: 33 g/dL (ref 30.0–36.0)
MCV: 92.8 fL (ref 80.0–100.0)
Monocytes Absolute: 0.4 10*3/uL (ref 0.1–1.0)
Monocytes Relative: 11 %
Neutro Abs: 2 10*3/uL (ref 1.7–7.7)
Neutrophils Relative %: 48 %
Platelets: 154 10*3/uL (ref 150–400)
RBC: 4.47 MIL/uL (ref 3.87–5.11)
RDW: 13.4 % (ref 11.5–15.5)
WBC: 4.1 10*3/uL (ref 4.0–10.5)
nRBC: 0 % (ref 0.0–0.2)

## 2022-09-02 LAB — COMPREHENSIVE METABOLIC PANEL
ALT: 26 U/L (ref 0–44)
AST: 28 U/L (ref 15–41)
Albumin: 4.4 g/dL (ref 3.5–5.0)
Alkaline Phosphatase: 77 U/L (ref 38–126)
Anion gap: 8 (ref 5–15)
BUN: 14 mg/dL (ref 8–23)
CO2: 27 mmol/L (ref 22–32)
Calcium: 9.5 mg/dL (ref 8.9–10.3)
Chloride: 105 mmol/L (ref 98–111)
Creatinine, Ser: 0.77 mg/dL (ref 0.44–1.00)
GFR, Estimated: >60 mL/min (ref >60)
Glucose, Bld: 90 mg/dL (ref 70–99)
Potassium: 4 mmol/L (ref 3.5–5.1)
Sodium: 140 mmol/L (ref 135–145)
Total Bilirubin: 0.8 mg/dL (ref 0.3–1.2)
Total Protein: 8.1 g/dL (ref 6.5–8.1)

## 2022-09-02 MED ORDER — IOHEXOL 300 MG/ML  SOLN
100.0000 mL | Freq: Once | INTRAMUSCULAR | Status: AC | PRN
  Administered 2022-09-02: 100 mL via INTRAVENOUS

## 2022-09-02 MED ORDER — IOHEXOL 9 MG/ML PO SOLN
ORAL | Status: AC
  Filled 2022-09-02: qty 1000

## 2022-09-04 LAB — CEA: CEA: 7.9 ng/mL — ABNORMAL HIGH (ref 0.0–4.7)

## 2022-09-08 NOTE — Progress Notes (Signed)
[facility] [address] [place] [address]    Clinic Day:  09/18/2022  Referring physician: Assunta Found, MD  Patient Care Team: Assunta Found, MD as PCP - General (Family Medicine) Wendall Stade, MD as PCP - Cardiology (Cardiology) Jena Gauss Gerrit Friends, MD (Gastroenterology)   ASSESSMENT & PLAN:   Assessment: 1.  Stage II (T2N0) hepatic flexure colon adenocarcinoma: -Status post right colon segmental resection on 06/15/2017, 3.2 cm tumor, 0/12 lymph nodes positive.  MMR normal/MSI low. -CEA a month after surgery was elevated at 6.3.  CT of the chest did not show any metastatic disease. -PET/CT scan on 08/16/2017 did not show any evidence of recurrence or metastatic disease.  Her CEA elevation is likely from her liver disease.  She has a history of hepatitis C and was treated for it. -CT scan of the abdomen pelvis on 02/23/2018 showed postoperative changes of partial right colectomy without definite evidence of local or metastatic disease.  There is mild narrowing in the region of hepatic flexure, presumably at surgical anastomosis. -CEA on 06/01/2018 has increased to 8.9.  Prior CEA 3 months ago was 7.7. -PET CT scan on 06/22/2018 showed no findings of recurrent or metastatic colon cancer.  Asymmetric palatine tonsillar activity, left greater than right. -CT of the abdomen pelvis on 01/11/2019 was negative for any recurrent or metastatic disease. -Patient had a colonoscopy on 01/18/2019 they found 2 polyps that were negative for malignancy. -CEA slightly elevated at 8.0.  We will continue to monitor this closely. -Labs done on 05/31/2019 showed CEA level 8.8. -CT AP done on 09/05/2019 showed no evidence of recurrent malignancy. -Last colonoscopy was on 01/18/2019.   2.  Hepatitis C: -Patient had a blood transfusion before 1974 was told she contracted hepatitis C. -She was treated 5 years ago for hepatitis C.    Plan: 1.  Stage II (T2N0) hepatic flexure colon  adenocarcinoma: - Denies any change in bowel habits, bleeding per rectum or melena. - Reviewed labs from 09/02/2022: Normal LFTs and CBC.  CEA 7.9. - Her baseline CEA is between 6.3 and 8.8. Most likely from underlying liver disease. - CTAP from 09/02/2022: No evidence of recurrence or metastatic disease.  Tiny right renal calculus. - Recommend follow-up in 6 months with repeat CEA, CBC and CMP.  After next visit we will switch her to once a year.   2.  Hepatitis C: - She was treated for hepatitis C in the past.  Orders Placed This Encounter  Procedures   CBC with Differential    Standing Status:   Future    Standing Expiration Date:   09/09/2023   Comprehensive metabolic panel    Standing Status:   Future    Standing Expiration Date:   09/09/2023   CEA    Standing Status:   Future    Standing Expiration Date:   09/09/2023      I,Katie Daubenspeck,acting as a scribe for Doreatha Massed, MD.,have documented all relevant documentation on the behalf of Doreatha Massed, MD,as directed by  Doreatha Massed, MD while in the presence of Doreatha Massed, MD.   I, Doreatha Massed MD, have reviewed the above documentation for accuracy and completeness, and I agree with the above.   Doreatha Massed, MD   5/17/20242:54 PM  CHIEF COMPLAINT:   Diagnosis: stage II colon cancer    Cancer Staging  Primary cancer of hepatic flexure of colon [facility]) Staging form: Colon and Rectum - Neuroendocine Tumors, AJCC 8th Edition -  Clinical stage from 06/15/2017: Stage IIA (cT2, cN0, cM0) - Signed by Doreatha Massed, MD on 08/18/2017    Prior Therapy: Right colon segmental resection on 06/15/2017   Current Therapy:  surveillance   HISTORY OF PRESENT ILLNESS:   Oncology History  Primary cancer of hepatic flexure of colon (HCC)  06/15/2017 Initial Diagnosis   Primary cancer of hepatic flexure of colon (HCC)   06/15/2017 Cancer Staging   Staging form: Colon and Rectum -  Neuroendocine Tumors, AJCC 8th Edition - Clinical stage from 06/15/2017: Stage IIA (cT2, cN0, cM0) - Signed by Doreatha Massed, MD on 08/18/2017      INTERVAL HISTORY:   Ariana Long is a 67 y.o. female presenting to clinic today for follow up of stage II colon cancer. She was last seen by me on 03/11/22.  Since her last visit, she underwent surveillance CT A/P on 09/02/22 showing: stable exam; no evidence of recurrent or metastatic disease.  Today, she states that she is doing well overall. Her appetite level is at 100%. Her energy level is at 100%.  PAST MEDICAL HISTORY:   Past Medical History: Past Medical History:  Diagnosis Date   Cirrhosis of liver (HCC) 01/26/2011   FIBROSIS on liver biopsy in 2007, U/S  03/31/2013= mild diffuse hepatic steatosis and /or hepatocellular disease without focal hepatic parenchymal abnormality. per Dr. Jacqualine Mau (2012) pt is immune to hep A and Hep B. per 12/06/13 note from Liver clinic-pt had MRI which showed cirrhosis 2015.   Diverticulitis of colon    Elevated AFP 01/26/2011   Gall stones    Hematuria 12/12/2014   Hemorrhoid 12/01/2012   Hemorrhoids    Hepatitis C 01/26/2011   2005 non responder, genotype 1, Gr 1-2, Stage 3 . Treated with Harvoni at the Liver clinic, responder   Hidradenitis    History of hiatal hernia    History of kidney stones    Hx: UTI (urinary tract infection)    Kidney stones 10/2011   Osteoporosis    Palpitations    Pre-diabetes    Primary cancer of hepatic flexure of colon (HCC) 06/15/2017   Serrated adenoma of colon 11/2011   Splenomegaly 01/26/2011   Thrombocytopenia (HCC) 01/26/2011   Tubular adenoma    Vulval lesion 12/14/2013   Has kissing lesions thin and puffy and paler i color about 5-6- mm in diameter.Dr Emelda Fear in to co examine will try temovate and recheck in 3 months    Surgical History: Past Surgical History:  Procedure Laterality Date   AXILLARY SURGERY Bilateral 1979   "glands removed"   BREAST  BIOPSY Right 2011   BREAST EXCISIONAL BIOPSY Bilateral 25 years ago   Benign, fatty tumors   CESAREAN SECTION     x 2   COLECTOMY  06/15/2017   LAPAROSCOPIC ASSISTED RIGHT COLECTOMY, ERAS PATHWAY/notes 06/15/2017   COLONOSCOPY  07/31/2005   Rourk-Normal rectum/Normal colonoscopy/Repeat screening colonoscopy ten years   COLONOSCOPY  11/16/2011   RMR: Friable anorectum-likely source of hematochezia/ LARGE 1.2x2.5CM SERRATED ADENOMA resected from ascending colon 7CM DISTAL TO ICV, hyperplastic polyp removed    COLONOSCOPY N/A 07/18/2012   Dr. Jena Gauss- normal appearing rectal mucosa. no residual polpy tissue seen at tatoo location. remainder of colonic mucosa appeared entirely normal. bx = tubular adenoma   COLONOSCOPY N/A 08/29/2015   Dr. Jena Gauss: single 12 mm polyp in ascending colon, 4 mm polyp ascending. Sessile serrated adenoma. 3 year surveillance   COLONOSCOPY N/A 05/19/2017   4X2 cm sessile lesion at hepatic flexdure, unable  to be completely removed   COLONOSCOPY N/A 01/18/2019   Dr. Jena Gauss: Several small polyps removed, hyperplastic.  Next colonoscopy in 5 years.   ESOPHAGEAL BANDING N/A 08/29/2015   Procedure: ESOPHAGEAL BANDING;  Surgeon: Corbin Ade, MD;  Location: [facility];  Service: Endoscopy;  Laterality: N/A;   ESOPHAGOGASTRODUODENOSCOPY N/A 08/29/2015   Dr. Jena Gauss: non-critical Schatzki's ring, somewhat prominent small submucosal vessels without obvious varices. hyperplastic gastric polyp. 2 year surveillance   EXTRACORPOREAL SHOCK WAVE LITHOTRIPSY Right 05/27/2021   Procedure: EXTRACORPOREAL SHOCK WAVE LITHOTRIPSY (ESWL);  Surgeon: Malen Gauze, MD;  Location: [facility] OR[facility];  Service: Urology;  Laterality: Right;   HEMORRHOID BANDING     LAPAROSCOPIC RIGHT COLECTOMY N/A 06/15/2017   Procedure: LAPAROSCOPIC ASSISTED RIGHT COLECTOMY, ERAS PATHWAY;  Surgeon: Claud Kelp, MD;  Location: [facility] OR;  Service: General;  Laterality: N/A;   POLYPECTOMY  05/19/2017   Procedure: POLYPECTOMY;   Surgeon: Corbin Ade, MD;  Location: [facility];  Service: Endoscopy;;   POLYPECTOMY  01/18/2019   Procedure: POLYPECTOMY;  Surgeon: Corbin Ade, MD;  Location: [facility];  Service: Endoscopy;;   SKIN LESION EXCISION      Social History: Social History   Socioeconomic History   Marital status: Married    Spouse name: Not on file   Number of children: 2   Years of education: Not on file   Highest education level: Not on file  Occupational History   Occupation: Systems analyst Op    Employer: [employer]  Tobacco Use   Smoking status: Never   Smokeless tobacco: Never  Vaping Use   Vaping Use: Never used  Substance and Sexual Activity   Alcohol use: No   Drug use: No   Sexual activity: Yes    Birth control/protection: Post-menopausal  Other Topics Concern   Not on file  Social History Narrative   Not on file   Social Determinants of Health   Financial Resource Strain: Low Risk  (04/22/2022)   Overall Financial Resource Strain (CARDIA)    Difficulty of Paying Living Expenses: Not hard at all  Food Insecurity: No Food Insecurity (04/22/2022)   Hunger Vital Sign    Worried About Running Out of Food in the Last Year: Never true    Ran Out of Food in the Last Year: Never true  Transportation Needs: No Transportation Needs (04/22/2022)   PRAPARE - Administrator, Civil Service (Medical): No    Lack of Transportation (Non-Medical): No  Physical Activity: Insufficiently Active (04/22/2022)   Exercise Vital Sign    Days of Exercise per Week: 3 days    Minutes of Exercise per Session: 30 min  Stress: No Stress Concern Present (04/22/2022)   [employer] of Occupational Health - Occupational Stress Questionnaire    Feeling of Stress : Not at all  Social Connections: Socially Integrated (04/22/2022)   Social Connection and Isolation Panel [NHANES]    Frequency of Communication with Friends and Family: More than three times a week    Frequency of  Social Gatherings with Friends and Family: Once a week    Attends Religious Services: More than 4 times per year    Active Member of [employer] or Organizations: Yes    Attends Banker Meetings: 1 to 4 times per year    Marital Status: Married  Catering manager Violence: Not At Risk (04/22/2022)   Humiliation, Afraid, Rape, and Kick questionnaire    Fear of Current or Ex-Partner: No  Emotionally Abused: No    Physically Abused: No    Sexually Abused: No    Family History: Family History  Problem Relation Age of Onset   Heart attack Mother    Stroke Mother    Aneurysm Father    Colon cancer Neg Hx     Current Medications:  Current Outpatient Medications:    Ascorbic Acid (VITAMIN C) 1000 MG tablet, Take 1,000 mg by mouth daily., Disp: , Rfl:    Cholecalciferol (VITAMIN D3) 50 MCG (2000 UT) TABS, Take 6,000 Units by mouth daily., Disp: , Rfl:    Magnesium 250 MG TABS, Take 1 tablet by mouth daily., Disp: , Rfl:    milk thistle 175 MG tablet, Take 1,752 mg by mouth., Disp: , Rfl:    Multiple Vitamins-Minerals (HAIR/SKIN/NAILS/BIOTIN PO), Take 1 tablet by mouth daily., Disp: , Rfl:    OLIVE LEAF EXTRACT PO, Take 1 tablet by mouth as needed., Disp: , Rfl:    pantoprazole (PROTONIX) 40 MG tablet, Take 40 mg by mouth daily. As needed, Disp: , Rfl:    Quercetin 250 MG TABS, Take 500 mg by mouth. , Disp: , Rfl:    zinc gluconate 50 MG tablet, Take 50 mg by mouth daily., Disp: , Rfl:    Allergies: No Known Allergies  REVIEW OF SYSTEMS:   Review of Systems  Constitutional:  Negative for chills, fatigue and fever.  HENT:   Negative for lump/mass, mouth sores, nosebleeds, sore throat and trouble swallowing.   Eyes:  Negative for eye problems.  Respiratory:  Negative for cough and shortness of breath.   Cardiovascular:  Negative for chest pain, leg swelling and palpitations.  Gastrointestinal:  Negative for abdominal pain, constipation, diarrhea, nausea and vomiting.   Genitourinary:  Negative for bladder incontinence, difficulty urinating, dysuria, frequency, hematuria and nocturia.   Musculoskeletal:  Negative for arthralgias, back pain, flank pain, myalgias and neck pain.  Skin:  Negative for itching and rash.  Neurological:  Negative for dizziness, headaches and numbness.  Hematological:  Does not bruise/bleed easily.  Psychiatric/Behavioral:  Negative for depression, sleep disturbance and suicidal ideas. The patient is not nervous/anxious.   All other systems reviewed and are negative.    VITALS:   Blood pressure 109/80, pulse 71, temperature 98.2 F (36.8 C), temperature source Oral, resp. rate 16, weight 144 lb 11.2 oz (65.6 kg), SpO2 99 %.  Wt Readings from Last 3 Encounters:  09/09/22 144 lb 11.2 oz (65.6 kg)  05/27/22 142 lb 12.8 oz (64.8 kg)  04/22/22 145 lb (65.8 kg)    Body mass index is 24.08 kg/m.  Performance status (ECOG): 1 - Symptomatic but completely ambulatory  PHYSICAL EXAM:   Physical Exam Vitals and nursing note reviewed. Exam conducted with a chaperone present.  Constitutional:      Appearance: Normal appearance.  Cardiovascular:     Rate and Rhythm: Normal rate and regular rhythm.     Pulses: Normal pulses.     Heart sounds: Normal heart sounds.  Pulmonary:     Effort: Pulmonary effort is normal.     Breath sounds: Normal breath sounds.  Abdominal:     Palpations: Abdomen is soft. There is no hepatomegaly, splenomegaly or mass.     Tenderness: There is no abdominal tenderness.  Musculoskeletal:     Right lower leg: No edema.     Left lower leg: No edema.  Lymphadenopathy:     Cervical: No cervical adenopathy.     Right cervical: No superficial,  deep or posterior cervical adenopathy.    Left cervical: No superficial, deep or posterior cervical adenopathy.     Upper Body:     Right upper body: No supraclavicular or axillary adenopathy.     Left upper body: No supraclavicular or axillary adenopathy.   Neurological:     General: No focal deficit present.     Mental Status: She is alert and oriented to person, place, and time.  Psychiatric:        Mood and Affect: Mood normal.        Behavior: Behavior normal.     LABS:      Latest Ref Rng & Units 09/02/2022   12:30 PM 05/27/2022    9:38 AM 03/04/2022    1:05 PM  CBC  WBC 4.0 - 10.5 K/uL 4.1  3.6  4.5   Hemoglobin 12.0 - 15.0 g/dL 78.2  95.6  21.3   Hematocrit 36.0 - 46.0 % 41.5  40.4  39.0   Platelets 150 - 400 K/uL 154  155  156       Latest Ref Rng & Units 09/02/2022   12:30 PM 05/27/2022    9:38 AM 03/04/2022    1:05 PM  CMP  Glucose 70 - 99 mg/dL 90  97  88   BUN 8 - 23 mg/dL 14  18  17    Creatinine 0.44 - 1.00 mg/dL 0.86  5.78  4.69   Sodium 135 - 145 mmol/L 140  137  141   Potassium 3.5 - 5.1 mmol/L 4.0  4.0  3.8   Chloride 98 - 111 mmol/L 105  103  106   CO2 22 - 32 mmol/L 27  26  27    Calcium 8.9 - 10.3 mg/dL 9.5  9.5  9.3   Total Protein 6.5 - 8.1 g/dL 8.1  7.9  7.6   Total Bilirubin 0.3 - 1.2 mg/dL 0.8  0.5  1.0   Alkaline Phos 38 - 126 U/L 77  69  70   AST 15 - 41 U/L 28  27  25    ALT 0 - 44 U/L 26  24  24       Lab Results  Component Value Date   CEA1 7.9 (H) 09/02/2022   /  CEA  Date Value Ref Range Status  09/02/2022 7.9 (H) 0.0 - 4.7 ng/mL Final    Comment:    (NOTE)                             Nonsmokers          <3.9                             Smokers             <5.6 Roche Diagnostics Electrochemiluminescence Immunoassay (ECLIA) Values obtained with different assay methods or kits cannot be used interchangeably.  Results cannot be interpreted as absolute evidence of the presence or absence of malignant disease. Performed At: [facility] [address] [place], [place] [id number] Jolene Schimke MD KG:[id number]    No results found for: "PSA1" No results found for: "CAN199" No results found for: "CAN125"  No results found for: "TOTALPROTELP", "ALBUMINELP", "A1GS", "A2GS",  "BETS", "BETA2SER", "GAMS", "MSPIKE", "SPEI" Lab Results  Component Value Date   TIBC 331 06/01/2018   TIBC 393 02/16/2018   TIBC 353 11/08/2017   FERRITIN 56  06/01/2018   FERRITIN 37 02/16/2018   FERRITIN 42 11/08/2017   IRONPCTSAT 24 06/01/2018   IRONPCTSAT 20 02/16/2018   IRONPCTSAT 16 11/08/2017   Lab Results  Component Value Date   LDH 182 01/15/2020   LDH 173 09/05/2019     STUDIES:   CT Abdomen Pelvis W Contrast  Result Date: 09/03/2022 CLINICAL DATA:  Follow-up colon carcinoma. Previous right colectomy. Surveillance. * Tracking Code: BO * EXAM: CT ABDOMEN AND PELVIS WITH CONTRAST TECHNIQUE: Multidetector CT imaging of the abdomen and pelvis was performed using the standard protocol following bolus administration of intravenous contrast. RADIATION DOSE REDUCTION: This exam was performed according to the departmental dose-optimization program which includes automated exposure control, adjustment of the mA and/or kV according to patient size and/or use of iterative reconstruction technique. CONTRAST:  OMNIPAQUE IOHEXOL 300 MG/ML  SOLN COMPARISON:  08/20/2021 FINDINGS: Lower Chest: No acute findings. Hepatobiliary: No hepatic masses identified. Gallstones are seen, however there is no evidence of cholecystitis or biliary dilatation. Pancreas:  No mass or inflammatory changes. Spleen: Within normal limits in size and appearance. Adrenals/Urinary Tract: No suspicious masses identified. 3 mm calculus noted in upper pole of right kidney. No evidence of ureteral calculi or hydronephrosis. Unremarkable unopacified urinary bladder. Stomach/Bowel: Postop changes are again seen from partial right colectomy. No soft tissue mass identified. No evidence of obstruction, inflammatory process or abnormal fluid collections. Vascular/Lymphatic: No pathologically enlarged lymph nodes. No acute vascular findings. Reproductive:  No mass or other significant abnormality. Other:  None. Musculoskeletal:   No suspicious bone lesions identified. IMPRESSION: Stable exam. No evidence of recurrent or metastatic carcinoma within the abdomen or pelvis. Cholelithiasis. No radiographic evidence of cholecystitis. Tiny right renal calculus. No evidence of ureteral calculi or hydronephrosis. Electronically Signed   By: Danae Orleans M.D.   On: 09/03/2022 09:21

## 2022-09-09 ENCOUNTER — Inpatient Hospital Stay (HOSPITAL_BASED_OUTPATIENT_CLINIC_OR_DEPARTMENT_OTHER): Payer: Medicare Other | Admitting: Hematology

## 2022-09-09 ENCOUNTER — Encounter: Payer: Self-pay | Admitting: Hematology

## 2022-09-09 VITALS — BP 109/80 | HR 71 | Temp 98.2°F | Resp 16 | Wt 144.7 lb

## 2022-09-09 DIAGNOSIS — R970 Elevated carcinoembryonic antigen [CEA]: Secondary | ICD-10-CM | POA: Diagnosis not present

## 2022-09-09 DIAGNOSIS — C183 Malignant neoplasm of hepatic flexure: Secondary | ICD-10-CM | POA: Diagnosis not present

## 2022-09-09 DIAGNOSIS — Z85038 Personal history of other malignant neoplasm of large intestine: Secondary | ICD-10-CM | POA: Diagnosis not present

## 2022-09-09 DIAGNOSIS — Z8619 Personal history of other infectious and parasitic diseases: Secondary | ICD-10-CM | POA: Diagnosis not present

## 2022-09-09 DIAGNOSIS — Z9049 Acquired absence of other specified parts of digestive tract: Secondary | ICD-10-CM | POA: Diagnosis not present

## 2022-09-09 DIAGNOSIS — Z862 Personal history of diseases of the blood and blood-forming organs and certain disorders involving the immune mechanism: Secondary | ICD-10-CM | POA: Diagnosis not present

## 2022-09-09 NOTE — Patient Instructions (Signed)
[facility]  Discharge Instructions  You were seen and examined today by Dr. Ellin Saba.  Your scan shows no signs of cancer recurrence. Your labs are stable.  Follow-up in 6 months with repeat labs.   Thank you for choosing [facility] to provide your oncology and hematology care.   To afford each patient quality time with our provider, please arrive at least 15 minutes before your scheduled appointment time. You may need to reschedule your appointment if you arrive late (10 or more minutes). Arriving late affects you and other patients whose appointments are after yours.  Also, if you miss three or more appointments without notifying the office, you may be dismissed from the clinic at the provider's discretion.    Again, thank you for choosing [facility].  Our hope is that these requests will decrease the amount of time that you wait before being seen by our physicians.   If you have a lab appointment with the Cancer Center - please note that after April 8th, all labs will be drawn in the cancer center.  You do not have to check in or register with the main entrance as you have in the past but will complete your check-in at the cancer center.            _____________________________________________________________  Should you have questions after your visit to [facility], please contact our office at [phone] and follow the prompts.  Our office hours are 8:00 a.m. to 4:30 p.m. Monday - Thursday and 8:00 a.m. to 2:30 p.m. Friday.  Please note that voicemails left after 4:00 p.m. may not be returned until the following business day.  We are closed weekends and all major holidays.  You do have access to a nurse 24-7, just call the main number to the clinic [phone] and do not press any options, hold on the line and a nurse will answer the phone.    For prescription refill requests, have your pharmacy  contact our office and allow 72 hours.    Masks are no longer required in the cancer centers. If you would like for your care team to wear a mask while they are taking care of you, please let them know. You may have one support person who is at least 69 years old accompany you for your appointments.

## 2022-09-15 ENCOUNTER — Ambulatory Visit: Payer: Medicare Other | Admitting: Gastroenterology

## 2022-09-21 NOTE — Progress Notes (Signed)
GI Office Note    Referring Provider: Assunta Found, MD Primary Care Physician:  Assunta Found, MD Primary Gastroenterologist: Gerrit Friends.Rourk, MD  Date:  09/30/2022  ID:  Ariana Long, DOB 11-29-1955, MRN [id number]   Chief Complaint   Chief Complaint  Patient presents with   Follow-up    Follow up on cirrhosis and CA bloodwork is high   History of Present Illness  Ariana Long is a 67 y.o. female with a history of stage II hepatic flexure colon adenocarcinoma s/p right segmental resection in 2019, hemorrhoids, cirrhosis via liver biopsy in 2007, and hep C s/p eradication presenting today due to concern of slight rise in CEA.  EGD 2017 without esophageal varices, she was recommended to have 2-year surveillance but requested to postpone procedure.    Last colonoscopy September 2020 with several hyperplastic polyps removed (repeat in 5 years).  Previously did not tolerate hemorrhoid banding.    CT abdomen pelvis 08/20/2021:  -No suspicious hepatic lesions -cholelithiasis without findings of acute cholecystitis -no biliary ductal dilation -normal pancreas and spleen,  -small hiatal hernia -stable postsurgical change of partial right hemicolectomy without findings suspicious for recurrent tumor.   Last seen in the office 11/05/2021.  Reported a growth getting bigger and becoming inflamed and was having a lot of itching but she had been soaking with warm water in the past and pain has stopped.  Reported lichen planus and also complained of yeast infection.  Had reported some toilet tissue hematochezia previously.  Denied abdominal pain, melena.  Requested Anusol suppositories over cream for management of hemorrhoids.  MELD labs ordered.  Placed on recall for rectal quadrant ultrasound in 3 months.  Advise continue pantoprazole 40 mg daily.  Anusol suppositories provided.  Due for colonoscopy in 2025, plan for EGD at same time for variceal screening (patient request).  Last office  visit 05/27/2022.  Only taking PPI as needed for GERD.  Sometimes is warm and regular makes for relief.  Watching her diet.  Denied dysphagia.  Reporting 1-2 bowel movements daily.  Denied any jaundice, pruritus, mental status change, confusion, ascites, abdominal pain, hematemesis, melena, or BRBPR.  Due for RUQ ultrasound and MELD labs as well as AFP.  Plans to schedule colonoscopy and EGD in 2025.  Continues PPI as needed.  Plan to follow-up in 6 months.  Labs 09/02/2022: CEA 7.9 (6.7).  Normal CMP and CBC.  Platelets 154.  CT A/P 09/02/2022: -Stable exam -No evidence of recurrent or metastatic carcinoma -No hepatoma -Cholelithiasis, no evidence of cholecystitis -Tiny right renal calculus, no hydronephrosis  Today:  Had some mild rectal bleeding the day after a GYN exam and rectal exam but none prior to that or since then.   Has been having a lot of stomach growling. Did a 14 day detox and just finished that and reports she had a lot come out of her. She's states she is not hungry when that happens. Denies any diarrhea or constipation. Was taking probiotics previously but all of a sudden stopped and did ran out. Just recently started back on them.   She reports a strong smell to her stool. No constipation or diarrhea. No change in bowel habits. No abdominal pain. No melena, N/V. Weight is stable. No jaundice. Appetite is good.   No jaundice, pruritus, edema, ascites, confusion.   MELD 3.0: 8 at 05/27/2022  9:38 AM MELD-Na: 7 at 05/27/2022  9:38 AM Calculated from: Serum Creatinine: 0.76 mg/dL (Using min of 1 mg/dL) at  05/27/2022  9:38 AM Serum Sodium: 137 mmol/L at 05/27/2022  9:38 AM Total Bilirubin: 0.5 mg/dL (Using min of 1 mg/dL) at 08/10/8117  1:47 AM Serum Albumin: 4.2 g/dL (Using max of 3.5 g/dL) at 12/31/5619  3:08 AM INR(ratio): 1.1 at 05/27/2022  9:38 AM Age at listing (hypothetical): 51 years Sex: Female at 05/27/2022  9:38 AM    Current Outpatient Medications  Medication Sig  Dispense Refill   Ascorbic Acid (VITAMIN C) 1000 MG tablet Take 1,000 mg by mouth daily.     Cholecalciferol (VITAMIN D3) 50 MCG (2000 UT) TABS Take 6,000 Units by mouth daily.     Magnesium 250 MG TABS Take 1 tablet by mouth daily.     milk thistle 175 MG tablet Take 1,752 mg by mouth.     Multiple Vitamins-Minerals (HAIR/SKIN/NAILS/BIOTIN PO) Take 1 tablet by mouth daily.     Quercetin 250 MG TABS Take 500 mg by mouth.      zinc gluconate 50 MG tablet Take 50 mg by mouth daily.     OLIVE LEAF EXTRACT PO Take 1 tablet by mouth as needed. (Patient not taking: Reported on 09/30/2022)     pantoprazole (PROTONIX) 40 MG tablet Take 40 mg by mouth daily. As needed (Patient not taking: Reported on 09/30/2022)     No current facility-administered medications for this visit.    Past Medical History:  Diagnosis Date   Cirrhosis of liver (HCC) 01/26/2011   FIBROSIS on liver biopsy in 2007, U/S  03/31/2013= mild diffuse hepatic steatosis and /or hepatocellular disease without focal hepatic parenchymal abnormality. per Dr. Jacqualine Mau (2012) pt is immune to hep A and Hep B. per 12/06/13 note from Liver clinic-pt had MRI which showed cirrhosis 2015.   Diverticulitis of colon    Elevated AFP 01/26/2011   Gall stones    Hematuria 12/12/2014   Hemorrhoid 12/01/2012   Hemorrhoids    Hepatitis C 01/26/2011   2005 non responder, genotype 1, Gr 1-2, Stage 3 . Treated with Harvoni at the Liver clinic, responder   Hidradenitis    History of hiatal hernia    History of kidney stones    Hx: UTI (urinary tract infection)    Kidney stones 10/2011   Osteoporosis    Palpitations    Pre-diabetes    Primary cancer of hepatic flexure of colon (HCC) 06/15/2017   Serrated adenoma of colon 11/2011   Splenomegaly 01/26/2011   Thrombocytopenia (HCC) 01/26/2011   Tubular adenoma    Vulval lesion 12/14/2013   Has kissing lesions thin and puffy and paler i color about 5-6- mm in diameter.Dr Emelda Fear in to co examine will try  temovate and recheck in 3 months    Past Surgical History:  Procedure Laterality Date   AXILLARY SURGERY Bilateral 1979   "glands removed"   BREAST BIOPSY Right 2011   BREAST EXCISIONAL BIOPSY Bilateral 25 years ago   Benign, fatty tumors   CESAREAN SECTION     x 2   COLECTOMY  06/15/2017   LAPAROSCOPIC ASSISTED RIGHT COLECTOMY, ERAS PATHWAY/notes 06/15/2017   COLONOSCOPY  07/31/2005   Rourk-Normal rectum/Normal colonoscopy/Repeat screening colonoscopy ten years   COLONOSCOPY  11/16/2011   RMR: Friable anorectum-likely source of hematochezia/ LARGE 1.2x2.5CM SERRATED ADENOMA resected from ascending colon 7CM DISTAL TO ICV, hyperplastic polyp removed    COLONOSCOPY N/A 07/18/2012   Dr. Jena Gauss- normal appearing rectal mucosa. no residual polpy tissue seen at tatoo location. remainder of colonic mucosa appeared entirely normal. bx = tubular adenoma  COLONOSCOPY N/A 08/29/2015   Dr. Jena Gauss: single 12 mm polyp in ascending colon, 4 mm polyp ascending. Sessile serrated adenoma. 3 year surveillance   COLONOSCOPY N/A 05/19/2017   4X2 cm sessile lesion at hepatic flexdure, unable to be completely removed   COLONOSCOPY N/A 01/18/2019   Dr. Jena Gauss: Several small polyps removed, hyperplastic.  Next colonoscopy in 5 years.   ESOPHAGEAL BANDING N/A 08/29/2015   Procedure: ESOPHAGEAL BANDING;  Surgeon: Corbin Ade, MD;  Location: [facility];  Service: Endoscopy;  Laterality: N/A;   ESOPHAGOGASTRODUODENOSCOPY N/A 08/29/2015   Dr. Jena Gauss: non-critical Schatzki's ring, somewhat prominent small submucosal vessels without obvious varices. hyperplastic gastric polyp. 2 year surveillance   EXTRACORPOREAL SHOCK WAVE LITHOTRIPSY Right 05/27/2021   Procedure: EXTRACORPOREAL SHOCK WAVE LITHOTRIPSY (ESWL);  Surgeon: Malen Gauze, MD;  Location: [facility];  Service: Urology;  Laterality: Right;   HEMORRHOID BANDING     LAPAROSCOPIC RIGHT COLECTOMY N/A 06/15/2017   Procedure: LAPAROSCOPIC ASSISTED RIGHT COLECTOMY,  ERAS PATHWAY;  Surgeon: Claud Kelp, MD;  Location: [facility] OR;  Service: General;  Laterality: N/A;   POLYPECTOMY  05/19/2017   Procedure: POLYPECTOMY;  Surgeon: Corbin Ade, MD;  Location: [facility];  Service: Endoscopy;;   POLYPECTOMY  01/18/2019   Procedure: POLYPECTOMY;  Surgeon: Corbin Ade, MD;  Location: [facility];  Service: Endoscopy;;   SKIN LESION EXCISION      Family History  Problem Relation Age of Onset   Heart attack Mother    Stroke Mother    Aneurysm Father    Colon cancer Neg Hx     Allergies as of 09/30/2022   (No Known Allergies)    Social History   Socioeconomic History   Marital status: Married    Spouse name: Not on file   Number of children: 2   Years of education: Not on file   Highest education level: Not on file  Occupational History   Occupation: Systems analyst Op    Employer: [employer]  Tobacco Use   Smoking status: Never   Smokeless tobacco: Never  Vaping Use   Vaping Use: Never used  Substance and Sexual Activity   Alcohol use: No   Drug use: No   Sexual activity: Yes    Birth control/protection: Post-menopausal  Other Topics Concern   Not on file  Social History Narrative   Not on file   Social Determinants of Health   Financial Resource Strain: Low Risk  (04/22/2022)   Overall Financial Resource Strain (CARDIA)    Difficulty of Paying Living Expenses: Not hard at all  Food Insecurity: No Food Insecurity (04/22/2022)   Hunger Vital Sign    Worried About Running Out of Food in the Last Year: Never true    Ran Out of Food in the Last Year: Never true  Transportation Needs: No Transportation Needs (04/22/2022)   PRAPARE - Administrator, Civil Service (Medical): No    Lack of Transportation (Non-Medical): No  Physical Activity: Insufficiently Active (04/22/2022)   Exercise Vital Sign    Days of Exercise per Week: 3 days    Minutes of Exercise per Session: 30 min  Stress: No Stress Concern Present  (04/22/2022)   [employer] of Occupational Health - Occupational Stress Questionnaire    Feeling of Stress : Not at all  Social Connections: Socially Integrated (04/22/2022)   Social Connection and Isolation Panel [NHANES]    Frequency of Communication with Friends and Family: More than three times  a week    Frequency of Social Gatherings with Friends and Family: Once a week    Attends Religious Services: More than 4 times per year    Active Member of [employer] or Organizations: Yes    Attends Banker Meetings: 1 to 4 times per year    Marital Status: Married     Review of Systems   Gen: Denies fever, chills, anorexia. Denies fatigue, weakness, weight loss.  CV: Denies chest pain, palpitations, syncope, peripheral edema, and claudication. Resp: Denies dyspnea at rest, cough, wheezing, coughing up blood, and pleurisy. GI: See HPI Derm: Denies rash, itching, dry skin Psych: Denies depression, anxiety, memory loss, confusion. No homicidal or suicidal ideation.  Heme: Denies bruising, bleeding, and enlarged lymph nodes.   Physical Exam   BP 118/75   Pulse (!) 57   Temp 97.8 F (36.6 C)   Ht 5' 5.5" (1.664 m)   Wt 144 lb 12.8 oz (65.7 kg)   BMI 23.73 kg/m   General:   Alert and oriented. No distress noted. Pleasant and cooperative.  Head:  Normocephalic and atraumatic. Eyes:  Conjuctiva clear without scleral icterus. Mouth:  Oral mucosa pink and moist. Good dentition. No lesions. Lungs:  Clear to auscultation bilaterally. No wheezes, rales, or rhonchi. No distress.  Heart:  S1, S2 present without murmurs appreciated.  Abdomen:  +BS, soft, non-tender and non-distended. No rebound or guarding. No HSM or masses noted. Rectal: deferred Msk:  Symmetrical without gross deformities. Normal posture. Extremities:  Without edema. Neurologic:  Alert and  oriented x4 Psych:  Alert and cooperative. Normal mood and affect.   Assessment  Ariana Long is a 67 y.o.  female with a history of stage II hepatic flexure colon adenocarcinoma s/p right segmental resection in 2019, cirrhosis and hepatitis C s/p eradication, and hemorrhoids presenting today with concern regarding rising CEA as well as gassiness and stomach growling.  Cirrhosis, history of hepatitis C: Previous treatment for hepatitis C s/p eradication.  Cirrhosis diagnosed in 2015.  EGD up-to-date without varices from 2017, previously recommended surveillance EGD however she has requested to wait until time of colonoscopy.  Has not had any melena or hematemesis.  MELD score 8 as of January 2024.  Due for hepatoma screening in November 2024.  Will plan to recheck MELD labs July 2024.  GERD: Currently using pantoprazole 40 mg as needed.  Has had some stomach growling and strong smell to her stool.  Denies any nausea, vomiting, dysphagia, or lack of appetite.  Given her stomach growling advised her to increase her pantoprazole to daily for 2 weeks and that she may use again as needed to see if this helps with symptoms.  History of colon cancer: Status post resection in 2019.  Follows regularly with Dr. Ellin Saba with oncology.  Recent CT scan 09/02/22 without evidence of recurrence or metastasis.  CEA has been increasing slightly which has worried her.  Advised him to keep an eye on this as well as Dr. Ellin Saba.  She is due for surveillance colonoscopy in 2025.  If she began to develop any alarm symptoms we will pursue this sooner.  Gassiness, stomach growling: Complains of frequent stomach growling as well as foul smell to her stool.  Denies any overt constipation or diarrhea or any other change in bowel habits.  Weight remains stable and appetite is good overall.  At times when she has a growling sound she does not feel hungry.  Has not been taking any probiotics  for the last few weeks and just started taking them again.  Notably just finished a 14-day detox.  Recent detox as well as stopping her probiotic may  be contributing to her symptoms as it appears this could be caused from gassiness.  Recommended over-the-counter anti-gas medication and avoid, gas-producing foods.  She denies any overt abdominal pain.  Provided some samples of Visibiome today to see if this probiotic is helpful for her symptoms.  PLAN   Visibiome probiotic.  Samples and discount card provided. Gas ex, beano, phazyme  as needed. Max 500 mg daily.  Recheck CMP, INR in July 2024.  Continue pantoprazole 40mg . Increase to daily for 2 weeks then may use as needed.  Avoid gas producing foods Colonoscopy and EGD in 2025.  Can consider early interval colonoscopy if symptoms persist or worsen.  Advised to continue to follow-up with oncology. RUQ [place] in 6 months.  Follow up in 6 months.     Brooke Bonito, MSN, FNP-BC, AGACNP-BC [facility]

## 2022-09-30 ENCOUNTER — Encounter: Payer: Self-pay | Admitting: Gastroenterology

## 2022-09-30 ENCOUNTER — Ambulatory Visit (INDEPENDENT_AMBULATORY_CARE_PROVIDER_SITE_OTHER): Payer: Medicare Other | Admitting: Gastroenterology

## 2022-09-30 VITALS — BP 118/75 | HR 57 | Temp 97.8°F | Ht 65.5 in | Wt 144.8 lb

## 2022-09-30 DIAGNOSIS — K219 Gastro-esophageal reflux disease without esophagitis: Secondary | ICD-10-CM

## 2022-09-30 DIAGNOSIS — R198 Other specified symptoms and signs involving the digestive system and abdomen: Secondary | ICD-10-CM

## 2022-09-30 DIAGNOSIS — R140 Abdominal distension (gaseous): Secondary | ICD-10-CM | POA: Diagnosis not present

## 2022-09-30 DIAGNOSIS — K7460 Unspecified cirrhosis of liver: Secondary | ICD-10-CM

## 2022-09-30 DIAGNOSIS — Z8619 Personal history of other infectious and parasitic diseases: Secondary | ICD-10-CM

## 2022-09-30 DIAGNOSIS — Z85038 Personal history of other malignant neoplasm of large intestine: Secondary | ICD-10-CM

## 2022-09-30 MED ORDER — PANTOPRAZOLE SODIUM 40 MG PO TBEC: 40.0000 mg | DELAYED_RELEASE_TABLET | Freq: Every day | ORAL | 1 refills | Status: DC

## 2022-09-30 NOTE — Patient Instructions (Signed)
Avoid gas-producing foods (eg, cabbage, legumes, onions, broccoli, brussel sprouts, wheat, and potatoes).    May use Gas-X, Beano, or Phazyme as needed.  The main ingredient simethicone and the max daily dose is 500 mg.  I will recommend you trying this once to twice a day as needed to see if this helps with the audible bowel sounds.  I would recommend you start taking pantoprazole 40 mg once daily for the next 2 weeks to see if this also helps with the gassiness that is occurring.  If it gets better you may continue, if it does not improve you can stop and use as needed.  If you develop any significant bloating, or change in bowel habits, melena, or change in appetite please let me know immediately.  You may send a MyChart message or call with a progress report in a couple weeks to let me know if things are getting better or not.  Will plan to complete your liver labs in July to assess her MELD score and follow-up in the office and obtain an ultrasound in 6 months.  It was a pleasure to see you today. I want to create trusting relationships with patients. If you receive a survey regarding your visit,  I greatly appreciate you taking time to fill this out on paper or through your MyChart. I value your feedback.  Brooke Bonito, MSN, FNP-BC, AGACNP-BC [facility]

## 2022-10-07 ENCOUNTER — Encounter: Payer: Self-pay | Admitting: Internal Medicine

## 2022-10-22 ENCOUNTER — Other Ambulatory Visit: Payer: Self-pay | Admitting: Gastroenterology

## 2022-10-23 ENCOUNTER — Other Ambulatory Visit: Payer: Self-pay

## 2022-10-23 DIAGNOSIS — Z8619 Personal history of other infectious and parasitic diseases: Secondary | ICD-10-CM

## 2022-10-23 DIAGNOSIS — K7460 Unspecified cirrhosis of liver: Secondary | ICD-10-CM

## 2022-10-23 DIAGNOSIS — K219 Gastro-esophageal reflux disease without esophagitis: Secondary | ICD-10-CM

## 2022-11-16 DIAGNOSIS — H40013 Open angle with borderline findings, low risk, bilateral: Secondary | ICD-10-CM | POA: Diagnosis not present

## 2022-11-17 ENCOUNTER — Telehealth: Payer: Self-pay

## 2022-11-17 ENCOUNTER — Emergency Department (HOSPITAL_COMMUNITY): Payer: Medicare Other

## 2022-11-17 ENCOUNTER — Other Ambulatory Visit: Payer: Self-pay

## 2022-11-17 ENCOUNTER — Emergency Department (HOSPITAL_COMMUNITY)
Admission: EM | Admit: 2022-11-17 | Discharge: 2022-11-17 | Disposition: A | Discharge status: Home / Self Care | Payer: Medicare Other | Attending: Emergency Medicine | Admitting: Emergency Medicine

## 2022-11-17 DIAGNOSIS — R748 Abnormal levels of other serum enzymes: Secondary | ICD-10-CM | POA: Diagnosis not present

## 2022-11-17 DIAGNOSIS — N200 Calculus of kidney: Secondary | ICD-10-CM | POA: Diagnosis not present

## 2022-11-17 DIAGNOSIS — R7401 Elevation of levels of liver transaminase levels: Secondary | ICD-10-CM | POA: Diagnosis not present

## 2022-11-17 DIAGNOSIS — R1013 Epigastric pain: Secondary | ICD-10-CM | POA: Diagnosis not present

## 2022-11-17 DIAGNOSIS — R1084 Generalized abdominal pain: Secondary | ICD-10-CM | POA: Diagnosis present

## 2022-11-17 DIAGNOSIS — K8020 Calculus of gallbladder without cholecystitis without obstruction: Secondary | ICD-10-CM | POA: Diagnosis not present

## 2022-11-17 LAB — CBC WITH DIFFERENTIAL/PLATELET
Abs Immature Granulocytes: 0.01 10*3/uL (ref 0.00–0.07)
Basophils Absolute: 0 10*3/uL (ref 0.0–0.1)
Basophils Relative: 1 %
Eosinophils Absolute: 0.1 10*3/uL (ref 0.0–0.5)
Eosinophils Relative: 2 %
HCT: 37.2 % (ref 36.0–46.0)
Hemoglobin: 12.4 g/dL (ref 12.0–15.0)
Immature Granulocytes: 0 %
Lymphocytes Relative: 25 %
Lymphs Abs: 1.4 10*3/uL (ref 0.7–4.0)
MCH: 31 pg (ref 26.0–34.0)
MCHC: 33.3 g/dL (ref 30.0–36.0)
MCV: 93 fL (ref 80.0–100.0)
Monocytes Absolute: 0.6 10*3/uL (ref 0.1–1.0)
Monocytes Relative: 11 %
Neutro Abs: 3.3 10*3/uL (ref 1.7–7.7)
Neutrophils Relative %: 61 %
Platelets: 144 10*3/uL — ABNORMAL LOW (ref 150–400)
RBC: 4 MIL/uL (ref 3.87–5.11)
RDW: 13.2 % (ref 11.5–15.5)
WBC: 5.3 10*3/uL (ref 4.0–10.5)
nRBC: 0 % (ref 0.0–0.2)

## 2022-11-17 LAB — URINALYSIS, ROUTINE W REFLEX MICROSCOPIC
Bilirubin Urine: NEGATIVE
Glucose, UA: NEGATIVE mg/dL
Hgb urine dipstick: NEGATIVE
Ketones, ur: NEGATIVE mg/dL
Leukocytes,Ua: NEGATIVE
Nitrite: NEGATIVE
Protein, ur: NEGATIVE mg/dL
Specific Gravity, Urine: 1.02 (ref 1.005–1.030)
pH: 5 (ref 5.0–8.0)

## 2022-11-17 LAB — COMPREHENSIVE METABOLIC PANEL
ALT: 45 U/L — ABNORMAL HIGH (ref 0–44)
AST: 73 U/L — ABNORMAL HIGH (ref 15–41)
Albumin: 3.8 g/dL (ref 3.5–5.0)
Alkaline Phosphatase: 85 U/L (ref 38–126)
Anion gap: 7 (ref 5–15)
BUN: 19 mg/dL (ref 8–23)
CO2: 26 mmol/L (ref 22–32)
Calcium: 9.1 mg/dL (ref 8.9–10.3)
Chloride: 107 mmol/L (ref 98–111)
Creatinine, Ser: 0.79 mg/dL (ref 0.44–1.00)
GFR, Estimated: >60 mL/min (ref >60)
Glucose, Bld: 98 mg/dL (ref 70–99)
Potassium: 4.1 mmol/L (ref 3.5–5.1)
Sodium: 140 mmol/L (ref 135–145)
Total Bilirubin: 0.6 mg/dL (ref 0.3–1.2)
Total Protein: 7.3 g/dL (ref 6.5–8.1)

## 2022-11-17 LAB — LIPASE, BLOOD: Lipase: 75 U/L — ABNORMAL HIGH (ref 11–51)

## 2022-11-17 MED ORDER — IOHEXOL 300 MG/ML  SOLN
100.0000 mL | Freq: Once | INTRAMUSCULAR | Status: AC | PRN
  Administered 2022-11-17: 100 mL via INTRAVENOUS

## 2022-11-17 NOTE — ED Triage Notes (Signed)
Pt states she woke up around 2300 with upper abdominal pain, denies N/V.

## 2022-11-17 NOTE — Telephone Encounter (Signed)
Pt is requesting an rx for the visviome probiotics to be called in. Last seen on 09/30/22.

## 2022-11-17 NOTE — ED Provider Notes (Signed)
[place] EMERGENCY DEPARTMENT AT [facility] Provider Note   CSN: [id number] Arrival date & time: 11/17/22  0038     History  Chief Complaint  Patient presents with   Abdominal Pain    Ariana Long is a 67 y.o. female.  Patient presents to the emergency department for evaluation of diffuse, sharp, stabbing upper abdominal pain that started around 1130.  She reports that it woke her from sleep.  The pain reminded her of pain she has had with kidney stones.  She reports that the pain has now eased off, is completely gone.  No vomiting, diarrhea, constipation, fever, urinary symptoms.       Home Medications Prior to Admission medications   Medication Sig Start Date End Date Taking? Authorizing Provider  Ascorbic Acid (VITAMIN C) 1000 MG tablet Take 1,000 mg by mouth daily.    [provider]  Cholecalciferol (VITAMIN D3) 50 MCG (2000 UT) TABS Take 6,000 Units by mouth daily.    [provider]  Magnesium 250 MG TABS Take 1 tablet by mouth daily.    [provider]  milk thistle 175 MG tablet Take 1,752 mg by mouth.    [provider]  Multiple Vitamins-Minerals (HAIR/SKIN/NAILS/BIOTIN PO) Take 1 tablet by mouth daily.    [provider]  OLIVE LEAF EXTRACT PO Take 1 tablet by mouth as needed. Patient not taking: Reported on 09/30/2022    [provider]  pantoprazole (PROTONIX) 40 MG tablet TAKE 1 TABLET (40 MG TOTAL) BY MOUTH DAILY. AS NEEDED 10/22/22   Rourk, Gerrit Friends, MD  Quercetin 250 MG TABS Take 500 mg by mouth.     [provider]  zinc gluconate 50 MG tablet Take 50 mg by mouth daily.    [provider]      Allergies    Patient has no known allergies.    Review of Systems   Review of Systems  Physical Exam Updated Vital Signs BP 113/73 (BP Location: Right Arm)   Pulse (!) 59   Temp 97.6 F (36.4 C) (Oral)   Resp 17   Ht 5' 5.5" (1.664 m)   Wt 65.3 kg   SpO2 99%   BMI 23.60  kg/m  Physical Exam Vitals and nursing note reviewed.  Constitutional:      General: She is not in acute distress.    Appearance: She is well-developed.  HENT:     Head: Normocephalic and atraumatic.     Mouth/Throat:     Mouth: Mucous membranes are moist.  Eyes:     General: Vision grossly intact. Gaze aligned appropriately.     Extraocular Movements: Extraocular movements intact.     Conjunctiva/sclera: Conjunctivae normal.  Cardiovascular:     Rate and Rhythm: Normal rate and regular rhythm.     Pulses: Normal pulses.     Heart sounds: Normal heart sounds, S1 normal and S2 normal. No murmur heard.    No friction rub. No gallop.  Pulmonary:     Effort: Pulmonary effort is normal. No respiratory distress.     Breath sounds: Normal breath sounds.  Abdominal:     General: Bowel sounds are normal.     Palpations: Abdomen is soft.     Tenderness: There is no abdominal tenderness. There is no guarding or rebound.     Hernia: No hernia is present.  Musculoskeletal:        General: No swelling.     Cervical back: Full passive  range of motion without pain, normal range of motion and neck supple. No spinous process tenderness or muscular tenderness. Normal range of motion.     Right lower leg: No edema.     Left lower leg: No edema.  Skin:    General: Skin is warm and dry.     Capillary Refill: Capillary refill takes less than 2 seconds.     Findings: No ecchymosis, erythema, rash or wound.  Neurological:     General: No focal deficit present.     Mental Status: She is alert and oriented to person, place, and time.     GCS: GCS eye subscore is 4. GCS verbal subscore is 5. GCS motor subscore is 6.     Cranial Nerves: Cranial nerves 2-12 are intact.     Sensory: Sensation is intact.     Motor: Motor function is intact.     Coordination: Coordination is intact.  Psychiatric:        Attention and Perception: Attention normal.        Mood and Affect: Mood normal.        Speech:  Speech normal.        Behavior: Behavior normal.     ED Results / Procedures / Treatments   Labs (all labs ordered are listed, but only abnormal results are displayed) Labs Reviewed  CBC WITH DIFFERENTIAL/PLATELET - Abnormal; Notable for the following components:      Result Value   Platelets 144 (*)    All other components within normal limits  COMPREHENSIVE METABOLIC PANEL - Abnormal; Notable for the following components:   AST 73 (*)    ALT 45 (*)    All other components within normal limits  LIPASE, BLOOD - Abnormal; Notable for the following components:   Lipase 75 (*)    All other components within normal limits  URINALYSIS, ROUTINE W REFLEX MICROSCOPIC    EKG None  Radiology CT ABDOMEN PELVIS W CONTRAST  Result Date: 11/17/2022 CLINICAL DATA:  Abdominal pain EXAM: CT ABDOMEN AND PELVIS WITH CONTRAST TECHNIQUE: Multidetector CT imaging of the abdomen and pelvis was performed using the standard protocol following bolus administration of intravenous contrast. RADIATION DOSE REDUCTION: This exam was performed according to the departmental dose-optimization program which includes automated exposure control, adjustment of the mA and/or kV according to patient size and/or use of iterative reconstruction technique. CONTRAST:  OMNIPAQUE IOHEXOL 300 MG/ML  SOLN COMPARISON:  512 FINDINGS: Lower Chest: Normal. Hepatobiliary: Normal hepatic contours. No intra- or extrahepatic biliary dilatation. There is cholelithiasis without acute inflammation. Pancreas: Normal pancreas. No ductal dilatation or peripancreatic fluid collection. Spleen: Normal. Adrenals/Urinary Tract: The adrenal glands are normal. Right nonobstructive nephrolithiasis measuring up to 3 mm. The urinary bladder is normal for degree of distention Stomach/Bowel: There is no hiatal hernia. Normal duodenal course and caliber. No small bowel dilatation or inflammation. No focal colonic abnormality. Normal appendix.  Vascular/Lymphatic: There is calcific atherosclerosis of the abdominal aorta. No lymphadenopathy. Reproductive: Normal uterus. No adnexal mass. Other: None. Musculoskeletal: No bony spinal canal stenosis or focal osseous abnormality. IMPRESSION: 1. No acute abnormality of the abdomen or pelvis. 2. Cholelithiasis without acute inflammation. 3. Nonobstructive right nephrolithiasis measuring up to 3 mm. Aortic Atherosclerosis (ICD10-I70.0). Electronically Signed   By: Deatra Robinson M.D.   On: 11/17/2022 03:30    Procedures Procedures    Medications Ordered in ED Medications  iohexol (OMNIPAQUE) 300 MG/ML solution 100 mL (100 mLs Intravenous Contrast Given 11/17/22 0311)  ED Course/ Medical Decision Making/ A&P                             Medical Decision Making Amount and/or Complexity of Data Reviewed Labs: ordered. Decision-making details documented in ED Course.  Risk Prescription drug management.   Differential Diagnosis considered includes, but not limited to: Cholelithiasis; cholecystitis; cholangitis; bowel obstruction; esophagitis; gastritis; peptic ulcer disease; pancreatitis; ureterolithiasis; cystitis  Patient presents to the emergency department for evaluation of abdominal pain.  She reports that it feels like when she had kidney stone in the past.  She describes, however, pain across the upper abdomen which would be unusual for urologic origin.  Urinalysis without signs of infection, no significant hematuria.  Lab work reveals very slight elevations of transaminases and lipase.  This is similar to prior pattern.  CT scan unremarkable.  Patient reports resolution of her symptoms on arrival.  She does report eating a subearlier tonight and this may have been simple cramping and pain secondary to GI origin that is self-limited.  Workup reassuring, will discharge, follow-up with primary care.        Final Clinical Impression(s) / ED Diagnoses Final diagnoses:  Epigastric  pain    Rx / DC Orders ED Discharge Orders     None         Keondra Haydu, Canary Brim, MD 11/17/22 [phone]

## 2022-11-18 ENCOUNTER — Encounter: Payer: Self-pay | Admitting: *Deleted

## 2022-11-18 NOTE — Telephone Encounter (Signed)
Sent pt a MyChart message

## 2022-11-30 ENCOUNTER — Other Ambulatory Visit (HOSPITAL_COMMUNITY)
Admission: RE | Admit: 2022-11-30 | Discharge: 2022-11-30 | Disposition: A | Discharge status: Home / Self Care | Payer: Medicare Other | Source: Ambulatory Visit | Attending: Gastroenterology | Admitting: Gastroenterology

## 2022-11-30 ENCOUNTER — Other Ambulatory Visit: Payer: Self-pay | Admitting: *Deleted

## 2022-11-30 DIAGNOSIS — K7460 Unspecified cirrhosis of liver: Secondary | ICD-10-CM | POA: Diagnosis not present

## 2022-11-30 LAB — COMPREHENSIVE METABOLIC PANEL
ALT: 25 U/L (ref 0–44)
AST: 28 U/L (ref 15–41)
Albumin: 4.3 g/dL (ref 3.5–5.0)
Alkaline Phosphatase: 80 U/L (ref 38–126)
Anion gap: 6 (ref 5–15)
BUN: 15 mg/dL (ref 8–23)
CO2: 26 mmol/L (ref 22–32)
Calcium: 9.5 mg/dL (ref 8.9–10.3)
Chloride: 104 mmol/L (ref 98–111)
Creatinine, Ser: 0.8 mg/dL (ref 0.44–1.00)
GFR, Estimated: >60 mL/min (ref >60)
Glucose, Bld: 87 mg/dL (ref 70–99)
Potassium: 3.8 mmol/L (ref 3.5–5.1)
Sodium: 136 mmol/L (ref 135–145)
Total Bilirubin: 0.7 mg/dL (ref 0.3–1.2)
Total Protein: 8 g/dL (ref 6.5–8.1)

## 2022-11-30 LAB — PROTIME-INR
INR: 0.9 (ref 0.8–1.2)
Prothrombin Time: 12.8 seconds (ref 11.4–15.2)

## 2022-12-14 ENCOUNTER — Encounter (HOSPITAL_COMMUNITY): Payer: Self-pay

## 2022-12-14 ENCOUNTER — Other Ambulatory Visit: Payer: Self-pay

## 2022-12-14 ENCOUNTER — Emergency Department (HOSPITAL_COMMUNITY)
Admission: EM | Admit: 2022-12-14 | Discharge: 2022-12-15 | Disposition: A | Discharge status: Home / Self Care | Payer: Medicare Other | Attending: Emergency Medicine | Admitting: Emergency Medicine

## 2022-12-14 DIAGNOSIS — M79601 Pain in right arm: Secondary | ICD-10-CM | POA: Diagnosis not present

## 2022-12-14 DIAGNOSIS — M25511 Pain in right shoulder: Secondary | ICD-10-CM | POA: Insufficient documentation

## 2022-12-14 DIAGNOSIS — Z85038 Personal history of other malignant neoplasm of large intestine: Secondary | ICD-10-CM | POA: Diagnosis not present

## 2022-12-14 DIAGNOSIS — R9431 Abnormal electrocardiogram [ECG] [EKG]: Secondary | ICD-10-CM | POA: Diagnosis not present

## 2022-12-14 NOTE — ED Triage Notes (Signed)
Pt reports intermittent right arm pain x 1 month.  Reports it improves with tylenol but her husband wanted her to get checked since it is worse when she sleeps.

## 2022-12-15 ENCOUNTER — Ambulatory Visit: Payer: Medicare Other | Admitting: Adult Health

## 2022-12-15 ENCOUNTER — Emergency Department (HOSPITAL_COMMUNITY): Payer: Medicare Other

## 2022-12-15 DIAGNOSIS — M79601 Pain in right arm: Secondary | ICD-10-CM | POA: Diagnosis not present

## 2022-12-15 DIAGNOSIS — M25511 Pain in right shoulder: Secondary | ICD-10-CM | POA: Diagnosis not present

## 2022-12-15 NOTE — ED Notes (Signed)
Patient transported to X-ray 

## 2022-12-15 NOTE — ED Provider Notes (Signed)
EMERGENCY DEPARTMENT AT [facility] Provider Note   CSN: [id number] Arrival date & time: 12/14/22  2205     History  Chief Complaint  Patient presents with   Arm Pain    Ariana Long is a 67 y.o. female.  HPI     This is a 67 year old female who presents with intermittent right arm pain.  Patient reports pain worsening over the last 2 to 3 days.  However she has experienced pain over the last month.  She describes pain in the right arm and shoulder.  It improves with Tylenol and different positions.  She denies any specific injury but states that she cares for her grandchildren and does pick them up.  She denies chest pain or shortness of breath.  Home Medications Prior to Admission medications   Medication Sig Start Date End Date Taking? Authorizing Provider  Ascorbic Acid (VITAMIN C) 1000 MG tablet Take 1,000 mg by mouth daily.    [provider]  Cholecalciferol (VITAMIN D3) 50 MCG (2000 UT) TABS Take 6,000 Units by mouth daily.    [provider]  Magnesium 250 MG TABS Take 1 tablet by mouth daily.    [provider]  milk thistle 175 MG tablet Take 1,752 mg by mouth.    [provider]  Multiple Vitamins-Minerals (HAIR/SKIN/NAILS/BIOTIN PO) Take 1 tablet by mouth daily.    [provider]  OLIVE LEAF EXTRACT PO Take 1 tablet by mouth as needed. Patient not taking: Reported on 09/30/2022    [provider]  pantoprazole (PROTONIX) 40 MG tablet TAKE 1 TABLET (40 MG TOTAL) BY MOUTH DAILY. AS NEEDED 10/22/22   Rourk, Gerrit Friends, MD  Quercetin 250 MG TABS Take 500 mg by mouth.     [provider]  zinc gluconate 50 MG tablet Take 50 mg by mouth daily.    [provider]      Allergies    Patient has no known allergies.    Review of Systems   Review of Systems  Constitutional:  Negative for fever.  Respiratory:  Negative for shortness of breath.   Cardiovascular:  Negative for chest  pain.  Musculoskeletal:        Arm pain  All other systems reviewed and are negative.   Physical Exam Updated Vital Signs BP 132/82 (BP Location: Right Arm)   Pulse 61   Temp (!) 97.5 F (36.4 C) (Oral)   Resp 20   Ht 1.651 m (5\' 5" )   Wt 65.3 kg   SpO2 100%   BMI 23.96 kg/m  Physical Exam Vitals and nursing note reviewed.  Constitutional:      Appearance: She is well-developed. She is not ill-appearing.  HENT:     Head: Normocephalic and atraumatic.  Eyes:     Pupils: Pupils are equal, round, and reactive to light.  Cardiovascular:     Rate and Rhythm: Normal rate and regular rhythm.     Heart sounds: Normal heart sounds.  Pulmonary:     Effort: Pulmonary effort is normal. No respiratory distress.     Breath sounds: No wheezing.  Abdominal:     General: Bowel sounds are normal.     Palpations: Abdomen is soft.  Musculoskeletal:     Cervical back: Neck supple.     Comments: Normal range of motion of the right shoulder, no obvious deformities, normal strength, no pain with range of motion, neurovascularly intact distally  Skin:    General:  Skin is warm and dry.  Neurological:     Mental Status: She is alert and oriented to person, place, and time.     ED Results / Procedures / Treatments   Labs (all labs ordered are listed, but only abnormal results are displayed) Labs Reviewed - No data to display  EKG EKG Interpretation Date/Time:  Tuesday December 15 2022 00:50:28 EDT Ventricular Rate:  56 PR Interval:  167 QRS Duration:  81 QT Interval:  464 QTC Calculation: 448 R Axis:   -4  Text Interpretation: Sinus rhythm Paired ventricular premature complexes Confirmed by Ross Marcus ([id number]) on 12/15/2022 1:03:48 AM  Radiology DG Shoulder Right  Result Date: 12/15/2022 CLINICAL DATA:  Right shoulder/arm pain x1 month EXAM: RIGHT SHOULDER - 2+ VIEW COMPARISON:  None Available. FINDINGS: No fracture or dislocation is seen. The joint spaces are preserved. The  visualized soft tissues are unremarkable. Visualized right lung is clear. IMPRESSION: Negative. Electronically Signed   By: Charline Bills M.D.   On: 12/15/2022 00:54    Procedures Procedures    Medications Ordered in ED Medications - No data to display  ED Course/ Medical Decision Making/ A&P                                 Medical Decision Making Amount and/or Complexity of Data Reviewed Radiology: ordered.   This patient presents to the ED for concern of right shoulder pain, this involves an extensive number of treatment options, and is a complaint that carries with it a high risk of complications and morbidity.  I considered the following differential and admission for this acute, potentially life threatening condition.  The differential diagnosis includes musculoskeletal etiologies such as arthritis, rotator cuff injury, less likely fracture  MDM:    This is a 67 year old female who presents for right shoulder pain.  This has been on and off for the last month but worsening over the last 2 to 3 days.  No chest pain or shortness of breath.  Screening EKG without evidence of acute ischemia or arrhythmia.  X-ray of the right shoulder does not show any obvious fracture or dislocation.  No significant arthritis.  Exam is fairly reassuring with intact strength which would make rotator cuff injury less likely.  Could be an overuse injury.  Recommend continuing ibuprofen and anti-inflammatories at home.  She may benefit from physical therapy.  (Labs, imaging, consults)  Labs: I Ordered, and personally interpreted labs.  The pertinent results include: None  Imaging Studies ordered: I ordered imaging studies including shoulder x-ray I independently visualized and interpreted imaging. I agree with the radiologist interpretation  Additional history obtained from husband at bedside.  External records from outside source obtained and reviewed including prior evaluations  Cardiac  Monitoring: The patient was maintained on a cardiac monitor.  If on the cardiac monitor, I personally viewed and interpreted the cardiac monitored which showed an underlying rhythm of: Sinus rhythm  Reevaluation: After the interventions noted above, I reevaluated the patient and found that they have :improved  Social Determinants of Health:  lives independently  Disposition: Discharge  Co morbidities that complicate the patient evaluation  Past Medical History:  Diagnosis Date   Cirrhosis of liver (HCC) 01/26/2011   FIBROSIS on liver biopsy in 2007, U/S  03/31/2013= mild diffuse hepatic steatosis and /or hepatocellular disease without focal hepatic parenchymal abnormality. per Dr. Jacqualine Mau (2012) pt is immune to hep A and  Hep B. per 12/06/13 note from Liver clinic-pt had MRI which showed cirrhosis 2015.   Diverticulitis of colon    Elevated AFP 01/26/2011   Gall stones    Hematuria 12/12/2014   Hemorrhoid 12/01/2012   Hemorrhoids    Hepatitis C 01/26/2011   2005 non responder, genotype 1, Gr 1-2, Stage 3 . Treated with Harvoni at the Liver clinic, responder   Hidradenitis    History of hiatal hernia    History of kidney stones    Hx: UTI (urinary tract infection)    Kidney stones 10/2011   Osteoporosis    Palpitations    Pre-diabetes    Primary cancer of hepatic flexure of colon (HCC) 06/15/2017   Serrated adenoma of colon 11/2011   Splenomegaly 01/26/2011   Thrombocytopenia (HCC) 01/26/2011   Tubular adenoma    Vulval lesion 12/14/2013   Has kissing lesions thin and puffy and paler i color about 5-6- mm in diameter.Dr Emelda Fear in to co examine will try temovate and recheck in 3 months     Medicines No orders of the defined types were placed in this encounter.   I have reviewed the patients home medicines and have made adjustments as needed  Problem List / ED Course: Problem List Items Addressed This Visit   None Visit Diagnoses     Acute pain of right shoulder    -   Primary                   Final Clinical Impression(s) / ED Diagnoses Final diagnoses:  Acute pain of right shoulder    Rx / DC Orders ED Discharge Orders     None         Shon Baton, MD 12/15/22 [phone]

## 2022-12-15 NOTE — Discharge Instructions (Addendum)
You were seen today for shoulder pain.  This is likely musculoskeletal in nature.  Avoid heavy lifting.  You may benefit from physical therapy.  Take Tylenol as needed for pain.

## 2022-12-22 ENCOUNTER — Telehealth: Payer: Self-pay

## 2022-12-22 NOTE — Telephone Encounter (Signed)
Transition Care Management Follow-up Telephone Call Date of discharge and from where: 12/15/2022 [facility] How have you been since you were released from the hospital? Patient stated she is feeling much better and the pain has not returned. Any questions or concerns? No  Items Reviewed: Did the pt receive and understand the discharge instructions provided? Yes  Medications obtained and verified?  No medication prescribed, OTC meds. Other? No  Any new allergies since your discharge? No  Dietary orders reviewed? Yes Do you have support at home? Yes   Follow up appointments reviewed:  PCP Hospital f/u appt confirmed? No  Scheduled to see  on  @ . Specialist Hospital f/u appt confirmed? No  Scheduled to see  on  @ . Are transportation arrangements needed? No  If their condition worsens, is the pt aware to call PCP or go to the Emergency Dept.? Yes Was the patient provided with contact information for the PCP's office or ED? Yes Was to pt encouraged to call back with questions or concerns? Yes  Amedio Bowlby [facility]  [facility] Population Health Community Resource Care Guide   [email or web address]  ?? [phone]   Website: [email or web address]  [email or web address]

## 2022-12-24 ENCOUNTER — Ambulatory Visit: Payer: Medicare Other | Admitting: Adult Health

## 2023-01-26 ENCOUNTER — Emergency Department (HOSPITAL_COMMUNITY): Payer: Medicare Other

## 2023-01-26 ENCOUNTER — Other Ambulatory Visit: Payer: Self-pay

## 2023-01-26 ENCOUNTER — Encounter (HOSPITAL_COMMUNITY): Payer: Self-pay | Admitting: Emergency Medicine

## 2023-01-26 ENCOUNTER — Emergency Department (HOSPITAL_COMMUNITY)
Admission: EM | Admit: 2023-01-26 | Discharge: 2023-01-26 | Disposition: A | Discharge status: Home / Self Care | Payer: Medicare Other | Attending: Emergency Medicine | Admitting: Emergency Medicine

## 2023-01-26 DIAGNOSIS — R002 Palpitations: Secondary | ICD-10-CM | POA: Insufficient documentation

## 2023-01-26 DIAGNOSIS — R079 Chest pain, unspecified: Secondary | ICD-10-CM | POA: Diagnosis not present

## 2023-01-26 DIAGNOSIS — R0602 Shortness of breath: Secondary | ICD-10-CM | POA: Diagnosis not present

## 2023-01-26 DIAGNOSIS — I700 Atherosclerosis of aorta: Secondary | ICD-10-CM | POA: Diagnosis not present

## 2023-01-26 LAB — CBC
HCT: 38.9 % (ref 36.0–46.0)
Hemoglobin: 13.1 g/dL (ref 12.0–15.0)
MCH: 31.3 pg (ref 26.0–34.0)
MCHC: 33.7 g/dL (ref 30.0–36.0)
MCV: 92.8 fL (ref 80.0–100.0)
Platelets: 145 10*3/uL — ABNORMAL LOW (ref 150–400)
RBC: 4.19 MIL/uL (ref 3.87–5.11)
RDW: 13.2 % (ref 11.5–15.5)
WBC: 4.6 10*3/uL (ref 4.0–10.5)
nRBC: 0 % (ref 0.0–0.2)

## 2023-01-26 LAB — BASIC METABOLIC PANEL
Anion gap: 7 (ref 5–15)
BUN: 17 mg/dL (ref 8–23)
CO2: 27 mmol/L (ref 22–32)
Calcium: 9.2 mg/dL (ref 8.9–10.3)
Chloride: 107 mmol/L (ref 98–111)
Creatinine, Ser: 0.83 mg/dL (ref 0.44–1.00)
GFR, Estimated: >60 mL/min (ref >60)
Glucose, Bld: 92 mg/dL (ref 70–99)
Potassium: 3.7 mmol/L (ref 3.5–5.1)
Sodium: 141 mmol/L (ref 135–145)

## 2023-01-26 LAB — TROPONIN I (HIGH SENSITIVITY)
Troponin I (High Sensitivity): 2 ng/L (ref <18)
Troponin I (High Sensitivity): 3 ng/L (ref <18)

## 2023-01-26 MED ORDER — ALBUTEROL SULFATE HFA 108 (90 BASE) MCG/ACT IN AERS: 2.0000 | INHALATION_SPRAY | RESPIRATORY_TRACT | Status: DC | PRN

## 2023-01-26 NOTE — ED Triage Notes (Addendum)
Pt to ed pov, c/o Sob and CP when pt woke up to go to bed. took BP at home and was elevated. Pt states they have a lot going on in their personal life and may be stress. EKG, O2 Sat and BP WDL in triage

## 2023-01-26 NOTE — ED Provider Notes (Signed)
[place] EMERGENCY DEPARTMENT AT [facility] Provider Note   CSN: [id number] Arrival date & time: 01/26/23  0018     History  Chief Complaint  Patient presents with   Shortness of Breath    Ariana Long is a 67 y.o. female.  Patient reports that when she was getting ready for bed tonight she started feeling heart palpitations.  She then felt a fullness and heaviness go up the back of her neck to her head.  Patient reports that she has had palpitations in the past and has had workup including outpatient monitoring without a diagnosis.  She indicates that she had a very stressful day today and maybe this was causing her symptoms.       Home Medications Prior to Admission medications   Medication Sig Start Date End Date Taking? Authorizing Provider  Ascorbic Acid (VITAMIN C) 1000 MG tablet Take 1,000 mg by mouth daily.   Yes [provider]  Cholecalciferol (VITAMIN D3) 50 MCG (2000 UT) TABS Take 6,000 Units by mouth daily.   Yes [provider]  Magnesium 250 MG TABS Take 1 tablet by mouth daily.   Yes [provider]  milk thistle 175 MG tablet Take 1,752 mg by mouth.   Yes [provider]  Multiple Vitamins-Minerals (HAIR/SKIN/NAILS/BIOTIN PO) Take 1 tablet by mouth daily.   Yes [provider]  Quercetin 250 MG TABS Take 500 mg by mouth.    Yes [provider]  zinc gluconate 50 MG tablet Take 50 mg by mouth daily.   Yes [provider]  OLIVE LEAF EXTRACT PO Take 1 tablet by mouth as needed. Patient not taking: Reported on 09/30/2022    [provider]  pantoprazole (PROTONIX) 40 MG tablet TAKE 1 TABLET (40 MG TOTAL) BY MOUTH DAILY. AS NEEDED 10/22/22   Rourk, Gerrit Friends, MD      Allergies    Patient has no known allergies.    Review of Systems   Review of Systems  Physical Exam Updated Vital Signs BP 126/82   Pulse 67   Temp 98 F (36.7 C) (Oral)   Resp 19   Ht 5\' 5"  (1.651 m)    Wt 59.4 kg   SpO2 97%   BMI 21.80 kg/m  Physical Exam Vitals and nursing note reviewed.  Constitutional:      General: She is not in acute distress.    Appearance: She is well-developed.  HENT:     Head: Normocephalic and atraumatic.     Mouth/Throat:     Mouth: Mucous membranes are moist.  Eyes:     General: Vision grossly intact. Gaze aligned appropriately.     Extraocular Movements: Extraocular movements intact.     Conjunctiva/sclera: Conjunctivae normal.  Cardiovascular:     Rate and Rhythm: Normal rate and regular rhythm.     Pulses: Normal pulses.     Heart sounds: Normal heart sounds, S1 normal and S2 normal. No murmur heard.    No friction rub. No gallop.  Pulmonary:     Effort: Pulmonary effort is normal. No respiratory distress.     Breath sounds: Normal breath sounds.  Abdominal:     General: Bowel sounds are normal.     Palpations: Abdomen is soft.     Tenderness: There is no abdominal tenderness. There is no guarding or rebound.     Hernia: No hernia is present.  Musculoskeletal:        General: No swelling.  Cervical back: Full passive range of motion without pain, normal range of motion and neck supple. No spinous process tenderness or muscular tenderness. Normal range of motion.     Right lower leg: No edema.     Left lower leg: No edema.  Skin:    General: Skin is warm and dry.     Capillary Refill: Capillary refill takes less than 2 seconds.     Findings: No ecchymosis, erythema, rash or wound.  Neurological:     General: No focal deficit present.     Mental Status: She is alert and oriented to person, place, and time.     GCS: GCS eye subscore is 4. GCS verbal subscore is 5. GCS motor subscore is 6.     Cranial Nerves: Cranial nerves 2-12 are intact.     Sensory: Sensation is intact.     Motor: Motor function is intact.     Coordination: Coordination is intact.  Psychiatric:        Attention and Perception: Attention normal.        Mood and  Affect: Mood normal.        Speech: Speech normal.        Behavior: Behavior normal.     ED Results / Procedures / Treatments   Labs (all labs ordered are listed, but only abnormal results are displayed) Labs Reviewed  CBC - Abnormal; Notable for the following components:      Result Value   Platelets 145 (*)    All other components within normal limits  BASIC METABOLIC PANEL  TROPONIN I (HIGH SENSITIVITY)  TROPONIN I (HIGH SENSITIVITY)    EKG None  Radiology DG Chest 2 View  Result Date: 01/26/2023 CLINICAL DATA:  Chest pain and shortness of breath. EXAM: CHEST - 2 VIEW COMPARISON:  12/26/2021. FINDINGS: The heart size and mediastinal contours are within normal limits. There is atherosclerotic calcification of the aorta. No consolidation, effusion, or pneumothorax. No acute osseous abnormality. IMPRESSION: No active cardiopulmonary disease. Electronically Signed   By: Thornell Sartorius M.D.   On: 01/26/2023 02:47    Procedures Procedures    Medications Ordered in ED Medications  albuterol (VENTOLIN HFA) 108 (90 Base) MCG/ACT inhaler 2 puff (has no administration in time range)    ED Course/ Medical Decision Making/ A&P                                 Medical Decision Making Amount and/or Complexity of Data Reviewed Labs: ordered. Radiology: ordered.  Risk Prescription drug management.   Presents to the emergency department for evaluation of heart palpitations, pressure sensation on the back of her neck.  Patient reports a very stressful day.  Vital signs are normal.  She has had a history of palpitations in the past.  No arrhythmia noted here in the ED.  EKG unremarkable.  Troponin negative x 2.  Electrolytes, remainder of blood work normal.  Patient reassured, does not need any intervention currently.  Will refer to cardiology for repeat evaluation of heart palpitations.        Final Clinical Impression(s) / ED Diagnoses Final diagnoses:  Heart palpitations     Rx / DC Orders ED Discharge Orders          Ordered    Ambulatory referral to Cardiology       Comments: If you have not heard from the Cardiology office within the next 72 hours  please call [phone].   01/26/23 0401              Gilda Crease, MD 01/26/23 [phone]

## 2023-02-09 ENCOUNTER — Ambulatory Visit: Payer: Medicare Other | Admitting: Gastroenterology

## 2023-02-17 ENCOUNTER — Telehealth: Payer: Self-pay | Admitting: *Deleted

## 2023-02-17 ENCOUNTER — Ambulatory Visit (INDEPENDENT_AMBULATORY_CARE_PROVIDER_SITE_OTHER): Payer: Medicare Other | Admitting: Gastroenterology

## 2023-02-17 ENCOUNTER — Encounter: Payer: Self-pay | Admitting: *Deleted

## 2023-02-17 ENCOUNTER — Encounter: Payer: Self-pay | Admitting: Gastroenterology

## 2023-02-17 VITALS — BP 117/76 | HR 68 | Temp 98.4°F | Ht 65.5 in | Wt 146.6 lb

## 2023-02-17 DIAGNOSIS — K219 Gastro-esophageal reflux disease without esophagitis: Secondary | ICD-10-CM | POA: Diagnosis not present

## 2023-02-17 DIAGNOSIS — Z85038 Personal history of other malignant neoplasm of large intestine: Secondary | ICD-10-CM

## 2023-02-17 DIAGNOSIS — K625 Hemorrhage of anus and rectum: Secondary | ICD-10-CM | POA: Diagnosis not present

## 2023-02-17 DIAGNOSIS — Z8619 Personal history of other infectious and parasitic diseases: Secondary | ICD-10-CM

## 2023-02-17 DIAGNOSIS — R1915 Other abnormal bowel sounds: Secondary | ICD-10-CM

## 2023-02-17 DIAGNOSIS — Z8719 Personal history of other diseases of the digestive system: Secondary | ICD-10-CM

## 2023-02-17 DIAGNOSIS — K649 Unspecified hemorrhoids: Secondary | ICD-10-CM

## 2023-02-17 DIAGNOSIS — K7460 Unspecified cirrhosis of liver: Secondary | ICD-10-CM | POA: Diagnosis not present

## 2023-02-17 NOTE — Patient Instructions (Addendum)
We will get you scheduled for an upper endoscopy and colonoscopy in near future with Dr. Jena Gauss.  I am also getting you scheduled for retrocardiac ultrasound to further evaluate your liver.  You may continue pantoprazole 40 mg as needed and the Visbiome probiotics.  I will look to see where you have options to buy this.  We need to have some episodes of rectal bleeding I would recommend that you use Preparation H 1-2 times daily for a few days as this is likely a flare of hemorrhoids.  If you are having more frequent hard stools and I would recommend you take a stool softener and/or increase your water intake  Will plan to follow-up in 3 months, sooner if needed.  I hope you have a great Thanksgiving and Christmas!  It was a pleasure to see you today. I want to create trusting relationships with patients. If you receive a survey regarding your visit,  I greatly appreciate you taking time to fill this out on paper or through your MyChart. I value your feedback.  Brooke Bonito, MSN, FNP-BC, AGACNP-BC [facility]

## 2023-02-17 NOTE — Telephone Encounter (Signed)
[facility]   [place] scheduled for Thursday 02/25/23, arrive at 9:15 am, NPO after midnight Also need to schedule TCS/EGD w/Dr.Rourk, ASA 3

## 2023-02-17 NOTE — Progress Notes (Signed)
GI Office Note    Referring Provider: Assunta Found, MD Primary Care Physician:  Assunta Found, MD Primary Gastroenterologist: Gerrit Friends.Rourk, MD  Date:  02/17/2023  ID:  LAKITHA STROMME, DOB Mar 21, 1956, MRN [id number]  Chief Complaint   Chief Complaint  Patient presents with   Colonoscopy    Pt has been seeing blood in her stools (over a year). Pt thought she was seeing Dr Jena Gauss today because she was told she was   History of Present Illness  BLAKLEIGH WAYCHOFF is a 67 y.o. female with a history of stage II hepatic flexure colon adenocarcinoma s/p right segmental resection in 2019, hemorrhoids, cirrhosis via liver biopsy in 2007, and hep C s/p eradication presenting today due to concern of slight rise in CEA.   EGD 2017 without esophageal varices, she was recommended to have 2-year surveillance but requested to postpone procedure.    Last colonoscopy September 2020 with several hyperplastic polyps removed (repeat in 5 years).  Previously did not tolerate hemorrhoid banding.    CT abdomen pelvis 08/20/2021:  -No suspicious hepatic lesions -cholelithiasis without findings of acute cholecystitis -no biliary ductal dilation -normal pancreas and spleen,  -small hiatal hernia -stable postsurgical change of partial right hemicolectomy without findings suspicious for recurrent tumor.   Office visit 11/05/2021.  Reported a growth getting bigger and becoming inflamed and was having a lot of itching but she had been soaking with warm water in the past and pain has stopped.  Reported lichen planus and also complained of yeast infection.  Had reported some toilet tissue hematochezia previously.  Denied abdominal pain, melena.  Requested Anusol suppositories over cream for management of hemorrhoids.  MELD labs ordered.  Placed on recall for rectal quadrant ultrasound in 3 months.  Advise continue pantoprazole 40 mg daily.  Anusol suppositories provided.  Due for colonoscopy in 2025, plan for EGD at same  time for variceal screening (patient request).   Office visit 05/27/2022.  Only taking PPI as needed for GERD.  Sometimes is warm and regular makes for relief.  Watching her diet.  Denied dysphagia.  Reporting 1-2 bowel movements daily.  Denied any jaundice, pruritus, mental status change, confusion, ascites, abdominal pain, hematemesis, melena, or BRBPR.  Due for RUQ ultrasound and MELD labs as well as AFP.  Plans to schedule colonoscopy and EGD in 2025.  Continue PPI as needed.  Plan to follow-up in 6 months.   Labs 09/02/2022: CEA 7.9 (6.7).  Normal CMP and CBC.  Platelets 154.   CT A/P 09/02/2022: -Stable exam -No evidence of recurrent or metastatic carcinoma -No hepatoma -Cholelithiasis, no evidence of cholecystitis -Tiny right renal calculus, no hydronephrosis  Last office visit 09/30/22.  Patient reported some mild rectal bleeding day after GYN exam with rectal exam but none prior since then.  Reported a lot of stomach growling but he recently did a 14-day detox which produced a lot of stools.  When she gets constipated like that she is not hungry.  Had stopped taking probiotics and recently began again.  Reported a strong smell to her stool but denies constipation or diarrhea.  No melena.  Labs in January with MELD 3.0: 8.  Given samples of Visbiome probiotic.  Advised antigas medication.  Plan to recheck MELD labs in July 2024.  Advised to increase PPI to daily for 2 weeks then as needed.  Advised to consider early interval colonoscopy if bleeding or bloating continues.  CT A/P with contrast 11/17/2022: -No acute abnormality within the  abdomen or pelvis -Gallstones present without cholecystitis -Normal duodenum and no colonic abnormality.  Today:  Still having some hard stools and has some bleeding. Does not happen that often but it is worrisome for her.  Denies any melena, lightheadedness, dizziness, pruritus, jaundice, mental status changes.  No peripheral edema or ascites.  Had upper  abdominal pain one night and went to the ED and then went back twice with heart palpitations and was told everything was fine. Has medication for heart palpitations and then drinks water and is usually okay. Sees cardiology this month as well as eye doctor.   MELD 3.0: 8 at 11/30/2022  9:02 AM MELD-Na: 6 at 11/30/2022  9:02 AM Calculated from: Serum Creatinine: 0.80 mg/dL (Using min of 1 mg/dL) at 1/76/1607  3:71 AM Serum Sodium: 136 mmol/L at 11/30/2022  9:02 AM Total Bilirubin: 0.7 mg/dL (Using min of 1 mg/dL) at 0/62/6948  5:46 AM Serum Albumin: 4.3 g/dL (Using max of 3.5 g/dL) at 2/70/3500  9:38 AM INR(ratio): 0.9 (Using min of 1) at 11/30/2022  9:02 AM Age at listing (hypothetical): 57 years Sex: Female at 11/30/2022  9:02 AM  Wt Readings from Last 3 Encounters:  02/17/23 146 lb 9.6 oz (66.5 kg)  01/26/23 131 lb (59.4 kg)  12/14/22 144 lb (65.3 kg)     Current Outpatient Medications  Medication Sig Dispense Refill   Ascorbic Acid (VITAMIN C) 1000 MG tablet Take 1,000 mg by mouth daily.     Cholecalciferol (VITAMIN D3) 50 MCG (2000 UT) TABS Take 6,000 Units by mouth daily.     Magnesium 250 MG TABS Take 1 tablet by mouth daily.     milk thistle 175 MG tablet Take 1,752 mg by mouth.     Multiple Vitamins-Minerals (HAIR/SKIN/NAILS/BIOTIN PO) Take 1 tablet by mouth daily.     pantoprazole (PROTONIX) 40 MG tablet TAKE 1 TABLET (40 MG TOTAL) BY MOUTH DAILY. AS NEEDED 90 tablet 3   Quercetin 250 MG TABS Take 500 mg by mouth.      zinc gluconate 50 MG tablet Take 50 mg by mouth daily.     OLIVE LEAF EXTRACT PO Take 1 tablet by mouth as needed. (Patient not taking: Reported on 09/30/2022)     No current facility-administered medications for this visit.    Past Medical History:  Diagnosis Date   Cirrhosis of liver (HCC) 01/26/2011   FIBROSIS on liver biopsy in 2007, U/S  03/31/2013= mild diffuse hepatic steatosis and /or hepatocellular disease without focal hepatic parenchymal abnormality.  per Dr. Jacqualine Mau (2012) pt is immune to hep A and Hep B. per 12/06/13 note from Liver clinic-pt had MRI which showed cirrhosis 2015.   Diverticulitis of colon    Elevated AFP 01/26/2011   Gall stones    Hematuria 12/12/2014   Hemorrhoid 12/01/2012   Hemorrhoids    Hepatitis C 01/26/2011   2005 non responder, genotype 1, Gr 1-2, Stage 3 . Treated with Harvoni at the Liver clinic, responder   Hidradenitis    History of hiatal hernia    History of kidney stones    Hx: UTI (urinary tract infection)    Kidney stones 10/2011   Osteoporosis    Palpitations    Pre-diabetes    Primary cancer of hepatic flexure of colon (HCC) 06/15/2017   Serrated adenoma of colon 11/2011   Splenomegaly 01/26/2011   Thrombocytopenia (HCC) 01/26/2011   Tubular adenoma    Vulval lesion 12/14/2013   Has kissing lesions thin and puffy and  paler i color about 5-6- mm in diameter.Dr Emelda Fear in to co examine will try temovate and recheck in 3 months    Past Surgical History:  Procedure Laterality Date   AXILLARY SURGERY Bilateral 1979   "glands removed"   BREAST BIOPSY Right 2011   BREAST EXCISIONAL BIOPSY Bilateral 25 years ago   Benign, fatty tumors   CESAREAN SECTION     x 2   COLECTOMY  06/15/2017   LAPAROSCOPIC ASSISTED RIGHT COLECTOMY, ERAS PATHWAY/notes 06/15/2017   COLONOSCOPY  07/31/2005   Rourk-Normal rectum/Normal colonoscopy/Repeat screening colonoscopy ten years   COLONOSCOPY  11/16/2011   RMR: Friable anorectum-likely source of hematochezia/ LARGE 1.2x2.5CM SERRATED ADENOMA resected from ascending colon 7CM DISTAL TO ICV, hyperplastic polyp removed    COLONOSCOPY N/A 07/18/2012   Dr. Jena Gauss- normal appearing rectal mucosa. no residual polpy tissue seen at tatoo location. remainder of colonic mucosa appeared entirely normal. bx = tubular adenoma   COLONOSCOPY N/A 08/29/2015   Dr. Jena Gauss: single 12 mm polyp in ascending colon, 4 mm polyp ascending. Sessile serrated adenoma. 3 year surveillance    COLONOSCOPY N/A 05/19/2017   4X2 cm sessile lesion at hepatic flexdure, unable to be completely removed   COLONOSCOPY N/A 01/18/2019   Dr. Jena Gauss: Several small polyps removed, hyperplastic.  Next colonoscopy in 5 years.   ESOPHAGEAL BANDING N/A 08/29/2015   Procedure: ESOPHAGEAL BANDING;  Surgeon: Corbin Ade, MD;  Location: [facility];  Service: Endoscopy;  Laterality: N/A;   ESOPHAGOGASTRODUODENOSCOPY N/A 08/29/2015   Dr. Jena Gauss: non-critical Schatzki's ring, somewhat prominent small submucosal vessels without obvious varices. hyperplastic gastric polyp. 2 year surveillance   EXTRACORPOREAL SHOCK WAVE LITHOTRIPSY Right 05/27/2021   Procedure: EXTRACORPOREAL SHOCK WAVE LITHOTRIPSY (ESWL);  Surgeon: Malen Gauze, MD;  Location: [facility];  Service: Urology;  Laterality: Right;   HEMORRHOID BANDING     LAPAROSCOPIC RIGHT COLECTOMY N/A 06/15/2017   Procedure: LAPAROSCOPIC ASSISTED RIGHT COLECTOMY, ERAS PATHWAY;  Surgeon: Claud Kelp, MD;  Location: [facility] OR;  Service: General;  Laterality: N/A;   POLYPECTOMY  05/19/2017   Procedure: POLYPECTOMY;  Surgeon: Corbin Ade, MD;  Location: [facility];  Service: Endoscopy;;   POLYPECTOMY  01/18/2019   Procedure: POLYPECTOMY;  Surgeon: Corbin Ade, MD;  Location: [facility];  Service: Endoscopy;;   SKIN LESION EXCISION      Family History  Problem Relation Age of Onset   Heart attack Mother    Stroke Mother    Aneurysm Father    Colon cancer Neg Hx     Allergies as of 02/17/2023   (No Known Allergies)    Social History   Socioeconomic History   Marital status: Married    Spouse name: Not on file   Number of children: 2   Years of education: Not on file   Highest education level: Not on file  Occupational History   Occupation: Systems analyst Op    Employer: [employer]  Tobacco Use   Smoking status: Never   Smokeless tobacco: Never  Vaping Use   Vaping status: Never Used  Substance and Sexual Activity   Alcohol  use: No   Drug use: No   Sexual activity: Yes    Birth control/protection: Post-menopausal  Other Topics Concern   Not on file  Social History Narrative   Not on file   Social Determinants of Health   Financial Resource Strain: Low Risk  (04/22/2022)   Overall Financial Resource Strain (CARDIA)    Difficulty of  Paying Living Expenses: Not hard at all  Food Insecurity: No Food Insecurity (04/22/2022)   Hunger Vital Sign    Worried About Running Out of Food in the Last Year: Never true    Ran Out of Food in the Last Year: Never true  Transportation Needs: No Transportation Needs (04/22/2022)   PRAPARE - Administrator, Civil Service (Medical): No    Lack of Transportation (Non-Medical): No  Physical Activity: Insufficiently Active (04/22/2022)   Exercise Vital Sign    Days of Exercise per Week: 3 days    Minutes of Exercise per Session: 30 min  Stress: No Stress Concern Present (04/22/2022)   [employer] of Occupational Health - Occupational Stress Questionnaire    Feeling of Stress : Not at all  Social Connections: Socially Integrated (04/22/2022)   Social Connection and Isolation Panel [NHANES]    Frequency of Communication with Friends and Family: More than three times a week    Frequency of Social Gatherings with Friends and Family: Once a week    Attends Religious Services: More than 4 times per year    Active Member of [employer] or Organizations: Yes    Attends Banker Meetings: 1 to 4 times per year    Marital Status: Married     Review of Systems   Gen: Denies fever, chills, anorexia. Denies fatigue, weakness, weight loss.  CV: Denies chest pain, palpitations, syncope, peripheral edema, and claudication. Resp: Denies dyspnea at rest, cough, wheezing, coughing up blood, and pleurisy. GI: See HPI Derm: Denies rash, itching, dry skin Psych: Denies depression, anxiety, memory loss, confusion. No homicidal or suicidal ideation.  Heme: Denies  bruising, bleeding, and enlarged lymph nodes.   Physical Exam   BP 117/76   Pulse 68   Temp 98.4 F (36.9 C)   Ht 5' 5.5" (1.664 m)   Wt 146 lb 9.6 oz (66.5 kg)   BMI 24.02 kg/m   General:   Alert and oriented. No distress noted. Pleasant and cooperative.  Head:  Normocephalic and atraumatic. Eyes:  Conjuctiva clear without scleral icterus. Mouth:  Oral mucosa pink and moist. Good dentition. No lesions. Lungs:  Clear to auscultation bilaterally. No wheezes, rales, or rhonchi. No distress.  Heart:  S1, S2 present without murmurs appreciated.  Abdomen:  +BS, soft, non-tender and non-distended. No rebound or guarding. No HSM or masses noted. Rectal: deferred Msk:  Symmetrical without gross deformities. Normal posture. Extremities:  Without edema. Neurologic:  Alert and  oriented x4 Psych:  Alert and cooperative. Normal mood and affect.   Assessment  LETHER DAMRON is a 67 y.o. female with a history of stage II hepatic flexure colon adenocarcinoma s/p right segmental resection in 2019, hemorrhoids, cirrhosis via liver biopsy in 2007, and hep C s/p eradication presenting today due to concern of slight rise in CEA.   Cirrhosis, history of hepatitis C: S/p treatment for hepatitis C with documented eradication.  Cirrhosis since 2015.  Due for surveillance EGD for esophageal varices which she has put off until time of colonoscopy.  Given we are pursuing colonoscopy now we will pursue EGD for EV surveillance.  Denies any melena or hematemesis.  No signs of hepatic encephalopathy.  Currently due for hepatoma screening upcoming therefore we will go ahead and order right upper quadrant ultrasound.  MELD stable.  Will be due for labs again in January 2025.  Hemorrhoids, rectal bleeding: Continues to have mild intermittent rectal bleeding related to having hard stools.  Suspect this is related to her known hemorrhoids however patient concerned given her history of colon cancer and increased CEA.   Hemoglobin has been stable.  We will proceed with early interval surveillance colonoscopy.  Advised stool softener as needed for hard stools and adequate hydration.  Stomach growling/gassiness has been improved with Visbiome probiotics.  GERD: Currently only with intermittent symptoms and takes pantoprazole as needed.  Denies any nausea, vomiting, dysphagia, or lack of appetite.   History of colon cancer: History of colon cancer in the hepatic flexure s/p right hemicolectomy in 2019.  Continues to follow with Dr. Ellin Saba with oncology.  Recent CT scan without evidence of recurrence or metastasis.  CEA remains slightly elevated/increasing by oncology following.  Discussed concerning to patient and given her rectal bleeding she would like early surveillance colonoscopy.  Currently without any weight loss or abdominal pain.  PLAN   RUQ [place] Proceed with colonoscopy and EGD with propofol by Dr. Jena Gauss  in near future: the risks, benefits, and alternatives have been discussed with the patient in detail. The patient states understanding and desires to proceed. ASA 3 Pantoprazole 40 mg as needed Continue visibiome probiotics Preparation H as needed for intermittent rectal bleeding Adequate hydration Stool softener as needed Follow up in 3 months   Brooke Bonito, MSN, FNP-BC, AGACNP-BC [facility]

## 2023-02-18 ENCOUNTER — Encounter: Payer: Self-pay | Admitting: *Deleted

## 2023-02-18 ENCOUNTER — Telehealth: Payer: Self-pay | Admitting: Internal Medicine

## 2023-02-18 NOTE — Telephone Encounter (Signed)
See previous TE

## 2023-02-18 NOTE — Telephone Encounter (Signed)
Patient left a message that she was returning your call.

## 2023-02-18 NOTE — Telephone Encounter (Signed)
LMTRC

## 2023-02-18 NOTE — Telephone Encounter (Signed)
Pt informed of [place] appt date, time and instructiions..  Schedule procedures for 04/09/23. Instructions printed and placed up from with sample of Clenpiq for pt to pick up

## 2023-02-23 DIAGNOSIS — H40013 Open angle with borderline findings, low risk, bilateral: Secondary | ICD-10-CM | POA: Diagnosis not present

## 2023-02-23 DIAGNOSIS — H25813 Combined forms of age-related cataract, bilateral: Secondary | ICD-10-CM | POA: Diagnosis not present

## 2023-02-23 DIAGNOSIS — H43812 Vitreous degeneration, left eye: Secondary | ICD-10-CM | POA: Diagnosis not present

## 2023-02-23 DIAGNOSIS — H35413 Lattice degeneration of retina, bilateral: Secondary | ICD-10-CM | POA: Diagnosis not present

## 2023-02-25 ENCOUNTER — Ambulatory Visit (INDEPENDENT_AMBULATORY_CARE_PROVIDER_SITE_OTHER): Payer: Medicare Other | Admitting: Student

## 2023-02-25 ENCOUNTER — Ambulatory Visit (HOSPITAL_COMMUNITY)
Admission: RE | Admit: 2023-02-25 | Discharge: 2023-02-25 | Disposition: A | Discharge status: Home / Self Care | Payer: Medicare Other | Source: Ambulatory Visit | Attending: Gastroenterology | Admitting: Gastroenterology

## 2023-02-25 ENCOUNTER — Encounter: Payer: Self-pay | Admitting: Student

## 2023-02-25 ENCOUNTER — Ambulatory Visit: Payer: Medicare Other | Admitting: Student

## 2023-02-25 VITALS — BP 128/80 | HR 62 | Ht 65.0 in | Wt 145.8 lb

## 2023-02-25 DIAGNOSIS — R002 Palpitations: Secondary | ICD-10-CM

## 2023-02-25 DIAGNOSIS — Z87898 Personal history of other specified conditions: Secondary | ICD-10-CM | POA: Diagnosis not present

## 2023-02-25 DIAGNOSIS — K8020 Calculus of gallbladder without cholecystitis without obstruction: Secondary | ICD-10-CM | POA: Diagnosis not present

## 2023-02-25 DIAGNOSIS — K7460 Unspecified cirrhosis of liver: Secondary | ICD-10-CM | POA: Insufficient documentation

## 2023-02-25 MED ORDER — METOPROLOL SUCCINATE ER 25 MG PO TB24: 12.5000 mg | ORAL_TABLET | Freq: Every day | ORAL | 3 refills | Status: DC

## 2023-02-25 NOTE — Progress Notes (Deleted)
Cardiology Office Note    Date:  02/25/2023  ID:  [facility], [facility] Jul 11, 1955, MRN [id number] Cardiologist: Ariana Haws, MD    History of Present Illness:    [facility] is a 67 y.o. female with past medical history of palpitations (prior monitor in 2020 showing PVCs but no significant arrhythmias, cirrhosis, treated hepatitis C and colon cancer (s/p prior resection in 2019) presents to the office today for overdue follow-up.  She was last examined by Dr. [facility] Long in 08/2020 and denied any recent chest pain or palpitations at that time. Her symptoms had improved since reducing her caffeine intake and she had not utilized Metoprolol recently. Was informed to follow-up in 1 year but has not been evaluated by Cardiology since.  She most recently [facility] 01[facility]26/2023 for evaluation of chest pain and shortness of breath while getting ready for bed. Reported associated palpitations as well and had been stressed and thought that might be contributing to her symptoms. Lab work at that time was unrevealing with hemoglobin and platelets within a normal range and electrolytes normal as well. Hs troponin values were negative and EKG showed normal sinus rhythm with no acute ST changes. She was informed to follow-up with Cardiology as an outpatient.  ROS: ***  Studies Reviewed:   EKG: EKG is not ordered today. EKG from 01/26/2023 is reviewed and shows NSR, HR 68 with baseline artifact and no acute ST changes.   NST: 02/2016 Blood pressure demonstrated a hypertensive response to exercise. The study is normal. This is a low risk study. Nuclear stress EF: 80%.  Event Monitor: 12/2018 NSR Symptomatic PVC;s No significant NSVT or PaF  Risk Assessment/Calculations:   {Does this patient have ATRIAL FIBRILLATION?[phone]} No BP recorded.  {Refresh Note OR Click here to enter BP  :1}***         Physical Exam:   VS:  There were no vitals taken for this visit.   Wt  Readings from Last 3 Encounters:  02/17/23 146 lb 9.6 oz (66.5 kg)  01/26/23 131 lb (59.4 kg)  12/14/22 144 lb (65.3 kg)     GEN: Well nourished, well developed in no acute distress NECK: No JVD; No carotid bruits CARDIAC: ***RRR, no murmurs, rubs, gallops RESPIRATORY:  Clear to auscultation without rales, wheezing or rhonchi  ABDOMEN: Appears non-distended. No obvious abdominal masses. EXTREMITIES: No clubbing or cyanosis. No edema.  Distal pedal pulses are 2+ bilaterally.   Assessment and Plan:   1. Chest Pain - ***  2. Palpitations - ***  Signed, Ellsworth Lennox, PA-C

## 2023-02-25 NOTE — Progress Notes (Signed)
Cardiology Office Note    Date:  02/25/2023  ID:  Jermya, Siefring July 14, 1955, MRN [id number] Cardiologist: Charlton Haws, MD    History of Present Illness:    [facility] is a 67 y.o. female with past medical history of palpitations (prior monitor in 2020 showing PVC's but no significant arrhythmias), cirrhosis, treated Hepatitis C and colon cancer (s/p resection in 2019) who presents to the office today for overdue follow-up.  She was last examined by Dr. Eden Emms in 08/2020 and denied any recent chest pain or palpitations at that time. Her symptoms had improved since reducing her caffeine intake and she had not utilized Metoprolol recently. Was informed to follow-up in 1 year but has not been evaluated by Cardiology since.  She most recently presented to [facility] ED on 01/26/2023 for evaluation of chest pain and shortness of breath while getting ready for bed. Reported associated palpitations as well and had been stressed and thought that might be contributing to her symptoms. Lab work at that time was unrevealing with hemoglobin and platelets within a normal range and electrolytes normal as well. Hs troponin values were negative and EKG showed normal sinus rhythm with no acute ST changes. She was informed to follow-up with Cardiology as an outpatient.  In talking the patient today, she reports more frequent palpitations over the past month with associated dizziness at times. Feels a skipping sensation and no tachy-palpitations. Palpitations can occur at rest or with activity and she only consumes decaffeinated beverages and does not consume alcohol. Reports that she has been under increased stress as she keeps her 57-year-old grandson on a daily basis. Did have an old Rx for Toprol-XL at home and reports this previously helped with symptoms but has not been taking this recently. No recent chest pain, dyspnea on exertion, orthopnea, PND or pitting edema.  Studies Reviewed:   EKG: EKG is  not ordered today. EKG from 01/26/2023 is reviewed and shows NSR, HR 68 with baseline artifact and no acute ST changes.   NST: 02/2016 Blood pressure demonstrated a hypertensive response to exercise. The study is normal. This is a low risk study. Nuclear stress EF: 80%.  Event Monitor: 12/2018 NSR Symptomatic PVC;s No significant NSVT or PaF   Physical Exam:   VS:  BP 128/80   Pulse 62   Ht 5\' 5"  (1.651 m)   Wt 145 lb 12.8 oz (66.1 kg)   SpO2 97%   BMI 24.26 kg/m    Wt Readings from Last 3 Encounters:  02/25/23 145 lb 12.8 oz (66.1 kg)  02/17/23 146 lb 9.6 oz (66.5 kg)  01/26/23 131 lb (59.4 kg)     GEN: Well nourished, well developed female appearing in no acute distress NECK: No JVD; No carotid bruits CARDIAC: RRR, no murmurs, rubs, gallops RESPIRATORY:  Clear to auscultation without rales, wheezing or rhonchi  ABDOMEN: Appears non-distended. No obvious abdominal masses. EXTREMITIES: No clubbing or cyanosis. No pitting edema.  Distal pedal pulses are 2+ bilaterally.   Assessment and Plan:   1. Palpitations - She does report brief, fleeting palpitations which overall by description sound similar to the PVC's she experienced in the past as noted on her prior monitor in 12/2018. Reviewed options today in regards to obtaining a repeat monitor vs. a trial of medical therapy and she wishes to try medications initially. Given that she responded well to Toprol-XL in the past, will restart this at her prior dose of Toprol-XL 12.5 mg daily. We  reviewed that if symptoms improve over the next several weeks, she could try weaning this to see if she has recurrent symptoms. I encouraged her to make [place] aware if palpitations do not improve as I would recommend a 2-week Zio patch for reassessment of any significant arrhythmias.  2. History of Chest Pain - Prior NST in 2017 was low-risk and showed no evidence of ischemia. She denies any recent chest pain.  - Continue with risk factor  modification. She does report being scheduled for follow-up labs with her PCP later this year for screening for Type II DM and Hyperlipidemia.  Signed, Ellsworth Lennox, PA-C

## 2023-02-25 NOTE — Patient Instructions (Signed)
Medication Instructions:  Your physician recommends that you continue on your current medications as directed. Please refer to the Current Medication list given to you today.  Take Toprol XL 12.5 mg Daily   Please call and let us know if no improvement in palpitations   *If you need a refill on your cardiac medications before your next appointment, please call your pharmacy*   Lab Work: NONE  If you have labs (blood work) drawn today and your tests are completely normal, you will receive your results only by: MyChart Message (if you have MyChart) OR A paper copy in the mail If you have any lab test that is abnormal or we need to change your treatment, we will call you to review the results.   Testing/Procedures: NONE    Follow-Up: At [facility], you and your health needs are our priority.  As part of our continuing mission to provide you with exceptional heart care, we have created designated Provider Care Teams.  These Care Teams include your primary Cardiologist (physician) and Advanced Practice Providers (APPs -  Physician Assistants and Nurse Practitioners) who all work together to provide you with the care you need, when you need it.  We recommend signing up for the patient portal called "MyChart".  Sign up information is provided on this After Visit Summary.  MyChart is used to connect with patients for Virtual Visits (Telemedicine).  Patients are able to view lab/test results, encounter notes, upcoming appointments, etc.  Non-urgent messages can be sent to your provider as well.   To learn more about what you can do with MyChart, go to [email or web address].    Your next appointment:   6 month(s)  Provider:   You may see Charlton Haws, MD or one of the following Advanced Practice Providers on your designated Care Team:   Randall An, PA-C  Jacolyn Reedy, PA-C     Other Instructions Thank you for choosing [facility]!

## 2023-03-05 ENCOUNTER — Inpatient Hospital Stay: Payer: Medicare Other | Attending: Hematology

## 2023-03-05 DIAGNOSIS — Z862 Personal history of diseases of the blood and blood-forming organs and certain disorders involving the immune mechanism: Secondary | ICD-10-CM | POA: Diagnosis not present

## 2023-03-05 DIAGNOSIS — Z85038 Personal history of other malignant neoplasm of large intestine: Secondary | ICD-10-CM | POA: Diagnosis not present

## 2023-03-05 DIAGNOSIS — K7460 Unspecified cirrhosis of liver: Secondary | ICD-10-CM | POA: Insufficient documentation

## 2023-03-05 DIAGNOSIS — R970 Elevated carcinoembryonic antigen [CEA]: Secondary | ICD-10-CM | POA: Insufficient documentation

## 2023-03-05 DIAGNOSIS — Z8619 Personal history of other infectious and parasitic diseases: Secondary | ICD-10-CM | POA: Insufficient documentation

## 2023-03-05 DIAGNOSIS — C183 Malignant neoplasm of hepatic flexure: Secondary | ICD-10-CM

## 2023-03-05 DIAGNOSIS — Z9049 Acquired absence of other specified parts of digestive tract: Secondary | ICD-10-CM | POA: Diagnosis not present

## 2023-03-05 LAB — COMPREHENSIVE METABOLIC PANEL
ALT: 24 U/L (ref 0–44)
AST: 27 U/L (ref 15–41)
Albumin: 4.1 g/dL (ref 3.5–5.0)
Alkaline Phosphatase: 76 U/L (ref 38–126)
Anion gap: 9 (ref 5–15)
BUN: 13 mg/dL (ref 8–23)
CO2: 26 mmol/L (ref 22–32)
Calcium: 9.6 mg/dL (ref 8.9–10.3)
Chloride: 105 mmol/L (ref 98–111)
Creatinine, Ser: 0.75 mg/dL (ref 0.44–1.00)
GFR, Estimated: >60 mL/min (ref >60)
Glucose, Bld: 91 mg/dL (ref 70–99)
Potassium: 4 mmol/L (ref 3.5–5.1)
Sodium: 140 mmol/L (ref 135–145)
Total Bilirubin: 0.6 mg/dL (ref 0.3–1.2)
Total Protein: 7.7 g/dL (ref 6.5–8.1)

## 2023-03-05 LAB — CBC WITH DIFFERENTIAL/PLATELET
Abs Immature Granulocytes: 0.01 10*3/uL (ref 0.00–0.07)
Basophils Absolute: 0.1 10*3/uL (ref 0.0–0.1)
Basophils Relative: 1 %
Eosinophils Absolute: 0.1 10*3/uL (ref 0.0–0.5)
Eosinophils Relative: 3 %
HCT: 41.2 % (ref 36.0–46.0)
Hemoglobin: 13.7 g/dL (ref 12.0–15.0)
Immature Granulocytes: 0 %
Lymphocytes Relative: 43 %
Lymphs Abs: 1.7 10*3/uL (ref 0.7–4.0)
MCH: 31 pg (ref 26.0–34.0)
MCHC: 33.3 g/dL (ref 30.0–36.0)
MCV: 93.2 fL (ref 80.0–100.0)
Monocytes Absolute: 0.5 10*3/uL (ref 0.1–1.0)
Monocytes Relative: 12 %
Neutro Abs: 1.7 10*3/uL (ref 1.7–7.7)
Neutrophils Relative %: 41 %
Platelets: 175 10*3/uL (ref 150–400)
RBC: 4.42 MIL/uL (ref 3.87–5.11)
RDW: 13.4 % (ref 11.5–15.5)
WBC: 4.1 10*3/uL (ref 4.0–10.5)
nRBC: 0 % (ref 0.0–0.2)

## 2023-03-06 LAB — CEA: CEA: 7.6 ng/mL — ABNORMAL HIGH (ref 0.0–4.7)

## 2023-03-11 ENCOUNTER — Inpatient Hospital Stay: Payer: Medicare Other | Admitting: Hematology

## 2023-03-11 NOTE — Progress Notes (Incomplete)
[facility] [address], [place] [address]    Clinic Day:  03/11/2023  Referring physician: Assunta Found, MD  Patient Care Team: Ariana Found, MD as PCP - General (Family Medicine) Ariana Stade, MD as PCP - Cardiology (Cardiology) Ariana Gauss Gerrit Friends, MD (Gastroenterology)   ASSESSMENT & PLAN:   Assessment: 1.  Stage II (T2N0) hepatic flexure colon adenocarcinoma: -Status post right colon segmental resection on 06/15/2017, 3.2 cm tumor, 0/12 lymph nodes positive.  MMR normal/MSI low. -CEA a month after surgery was elevated at 6.3.  CT of the chest did not show any metastatic disease. -PET/CT scan on 08/16/2017 did not show any evidence of recurrence or metastatic disease.  Her CEA elevation is likely from her liver disease.  She has a history of hepatitis C and was treated for it. -CT scan of the abdomen pelvis on 02/23/2018 showed postoperative changes of partial right colectomy without definite evidence of local or metastatic disease.  There is mild narrowing in the region of hepatic flexure, presumably at surgical anastomosis. -CEA on 06/01/2018 has increased to 8.9.  Prior CEA 3 months ago was 7.7. -PET CT scan on 06/22/2018 showed no findings of recurrent or metastatic colon cancer.  Asymmetric palatine tonsillar activity, left greater than right. -CT of the abdomen pelvis on 01/11/2019 was negative for any recurrent or metastatic disease. -Patient had a colonoscopy on 01/18/2019 they Long 2 polyps that were negative for malignancy. -CEA slightly elevated at 8.0.  We will continue to monitor this closely. -Labs done on 05/31/2019 showed CEA level 8.8. -CT AP done on 09/05/2019 showed no evidence of recurrent malignancy. -Last colonoscopy was on 01/18/2019.   2.  Hepatitis C: -Patient had a blood transfusion before 1974 was told she contracted hepatitis C. -She was treated 5 years ago for hepatitis C.    Plan: 1.  Stage II (T2N0) hepatic flexure colon  adenocarcinoma: - Denies any change in bowel habits, bleeding per rectum or melena. - Reviewed labs from 09/02/2022: Normal LFTs and CBC.  CEA 7.9. - Her baseline CEA is between 6.3 and 8.8. Most likely from underlying liver disease. - CTAP from 09/02/2022: No evidence of recurrence or metastatic disease.  Tiny right renal calculus. - Recommend follow-up in 6 months with repeat CEA, CBC and CMP.  After next visit we will switch her to once a year.   2.  Hepatitis C: - She was treated for hepatitis C in the past.  No orders of the defined types were placed in this encounter.     Ariana Long,acting as a Neurosurgeon for Ariana Massed, MD.,have documented all relevant documentation on the behalf of Ariana Massed, MD,as directed by  Ariana Massed, MD while in the presence of Ariana Massed, MD.  ***   Ariana Long   11/7/20248:27 AM  CHIEF COMPLAINT:   Diagnosis: stage II colon cancer    Cancer Staging  Primary cancer of hepatic flexure of colon [facility]) Staging form: Colon and Rectum - Neuroendocine Tumors, AJCC 8th Edition - Clinical stage from 06/15/2017: Stage IIA (cT2, cN0, cM0) - Signed by Ariana Massed, MD on 08/18/2017    Prior Therapy: Right colon segmental resection on 06/15/2017   Current Therapy:  surveillance   HISTORY OF PRESENT ILLNESS:   Oncology History  Primary cancer of hepatic flexure of colon (HCC)  06/15/2017 Initial Diagnosis   Primary cancer of hepatic flexure of colon (HCC)   06/15/2017 Cancer Staging   Staging form: Colon  and Rectum - Neuroendocine Tumors, AJCC 8th Edition - Clinical stage from 06/15/2017: Stage IIA (cT2, cN0, cM0) - Signed by Ariana Massed, MD on 08/18/2017      INTERVAL HISTORY:   Ariana Long is a 67 y.o. female presenting to clinic today for follow up of stage II colon cancer. She was last seen by me on 09/09/22.  Since her last visit, she presented to the ED on 11/17/22 for epigastric pain, 12/14/22 for  acute right shoulder pain, and 01/26/23 heart palpitations. She has a colonoscopy scheduled for 04/09/23.   Today, she states that she is doing well overall. Her appetite level is at ***%. Her energy level is at ***%.  PAST MEDICAL HISTORY:   Past Medical History: Past Medical History:  Diagnosis Date   Cirrhosis of liver (HCC) 01/26/2011   FIBROSIS on liver biopsy in 2007, U/S  03/31/2013= mild diffuse hepatic steatosis and /or hepatocellular disease without focal hepatic parenchymal abnormality. per Dr. Jacqualine Mau (2012) pt is immune to hep A and Hep B. per 12/06/13 note from Liver clinic-pt had MRI which showed cirrhosis 2015.   Diverticulitis of colon    Elevated AFP 01/26/2011   Gall stones    Hematuria 12/12/2014   Hemorrhoid 12/01/2012   Hemorrhoids    Hepatitis C 01/26/2011   2005 non responder, genotype 1, Gr 1-2, Stage 3 . Treated with Harvoni at the Liver clinic, responder   Hidradenitis    History of hiatal hernia    History of kidney stones    Hx: UTI (urinary tract infection)    Kidney stones 10/2011   Osteoporosis    Palpitations    Pre-diabetes    Primary cancer of hepatic flexure of colon (HCC) 06/15/2017   Serrated adenoma of colon 11/2011   Splenomegaly 01/26/2011   Thrombocytopenia (HCC) 01/26/2011   Tubular adenoma    Vulval lesion 12/14/2013   Has kissing lesions thin and puffy and paler i color about 5-6- mm in diameter.Dr Emelda Fear in to co examine will try temovate and recheck in 3 months    Surgical History: Past Surgical History:  Procedure Laterality Date   AXILLARY SURGERY Bilateral 1979   "glands removed"   BREAST BIOPSY Right 2011   BREAST EXCISIONAL BIOPSY Bilateral 25 years ago   Benign, fatty tumors   CESAREAN SECTION     x 2   COLECTOMY  06/15/2017   LAPAROSCOPIC ASSISTED RIGHT COLECTOMY, ERAS PATHWAY/notes 06/15/2017   COLONOSCOPY  07/31/2005   Rourk-Normal rectum/Normal colonoscopy/Repeat screening colonoscopy ten years   COLONOSCOPY   11/16/2011   RMR: Friable anorectum-likely source of hematochezia/ LARGE 1.2x2.5CM SERRATED ADENOMA resected from ascending colon 7CM DISTAL TO ICV, hyperplastic polyp removed    COLONOSCOPY N/A 07/18/2012   Dr. Jena Gauss- normal appearing rectal mucosa. no residual polpy tissue seen at tatoo location. remainder of colonic mucosa appeared entirely normal. bx = tubular adenoma   COLONOSCOPY N/A 08/29/2015   Dr. Jena Gauss: single 12 mm polyp in ascending colon, 4 mm polyp ascending. Sessile serrated adenoma. 3 year surveillance   COLONOSCOPY N/A 05/19/2017   4X2 cm sessile lesion at hepatic flexdure, unable to be completely removed   COLONOSCOPY N/A 01/18/2019   Dr. Jena Gauss: Several small polyps removed, hyperplastic.  Next colonoscopy in 5 years.   ESOPHAGEAL BANDING N/A 08/29/2015   Procedure: ESOPHAGEAL BANDING;  Surgeon: Corbin Ade, MD;  [facility]: [facility];  Service: Endoscopy;  Laterality: N/A;   ESOPHAGOGASTRODUODENOSCOPY N/A 08/29/2015   Dr. Jena Gauss: non-critical Schatzki's ring, somewhat prominent  small submucosal vessels without obvious varices. hyperplastic gastric polyp. 2 year surveillance   EXTRACORPOREAL SHOCK WAVE LITHOTRIPSY Right 05/27/2021   Procedure: EXTRACORPOREAL SHOCK WAVE LITHOTRIPSY (ESWL);  Surgeon: Malen Gauze, MD;  Location: [facility];  Service: Urology;  Laterality: Right;   HEMORRHOID BANDING     LAPAROSCOPIC RIGHT COLECTOMY N/A 06/15/2017   Procedure: LAPAROSCOPIC ASSISTED RIGHT COLECTOMY, ERAS PATHWAY;  Surgeon: Claud Kelp, MD;  Location: [facility];  Service: General;  Laterality: N/A;   POLYPECTOMY  05/19/2017   Procedure: POLYPECTOMY;  Surgeon: Corbin Ade, MD;  [facility]: [facility];  Service: Endoscopy;;   POLYPECTOMY  01/18/2019   Procedure: POLYPECTOMY;  Surgeon: Corbin Ade, MD;  Location: [facility];  Service: Endoscopy;;   SKIN LESION EXCISION      Social History: Social History   Socioeconomic History   Marital status: Married    Spouse  name: Not on file   Number of children: 2   Years of education: Not on file   Highest education level: Not on file  Occupational History   Occupation: Systems analyst Op    Employer: [employer]  Tobacco Use   Smoking status: Never   Smokeless tobacco: Never  Vaping Use   Vaping status: Never Used  Substance and Sexual Activity   Alcohol use: No   Drug use: No   Sexual activity: Yes    Birth control/protection: Post-menopausal  Other Topics Concern   Not on file  Social History Narrative   Not on file   Social Determinants of Health   Financial Resource Strain: Low Risk  (04/22/2022)   Overall Financial Resource Strain (CARDIA)    Difficulty of Paying Living Expenses: Not hard at all  Food Insecurity: No Food Insecurity (04/22/2022)   Hunger Vital Sign    Worried About Running Out of Food in the Last Year: Never true    Ran Out of Food in the Last Year: Never true  Transportation Needs: No Transportation Needs (04/22/2022)   PRAPARE - Administrator, Civil Service (Medical): No    Lack of Transportation (Non-Medical): No  Physical Activity: Insufficiently Active (04/22/2022)   Exercise Vital Sign    Days of Exercise per Week: 3 days    Minutes of Exercise per Session: 30 min  Stress: No Stress Concern Present (04/22/2022)   [employer] of Occupational Health - Occupational Stress Questionnaire    Feeling of Stress : Not at all  Social Connections: Socially Integrated (04/22/2022)   Social Connection and Isolation Panel [NHANES]    Frequency of Communication with Long and Family: More than three times a week    Frequency of Social Gatherings with Long and Family: Once a week    Attends Religious Services: More than 4 times per year    Active Member of [employer] or Organizations: Yes    Attends Banker Meetings: 1 to 4 times per year    Marital Status: Married  Catering manager Violence: Not At Risk (04/22/2022)   Humiliation, Afraid,  Rape, and Kick questionnaire    Fear of Current or Ex-Partner: No    Emotionally Abused: No    Physically Abused: No    Sexually Abused: No    Family History: Family History  Problem Relation Age of Onset   Heart attack Mother    Stroke Mother    Aneurysm Father    Colon cancer Neg Hx     Current Medications:  Current Outpatient Medications:  Ascorbic Acid (VITAMIN C) 1000 MG tablet, Take 1,000 mg by mouth daily., Disp: , Rfl:    Cholecalciferol (VITAMIN D3) 50 MCG (2000 UT) TABS, Take 6,000 Units by mouth daily., Disp: , Rfl:    Magnesium 250 MG TABS, Take 1 tablet by mouth daily., Disp: , Rfl:    metoprolol succinate (TOPROL XL) 25 MG 24 hr tablet, Take 0.5 tablets (12.5 mg total) by mouth daily., Disp: 45 tablet, Rfl: 3   milk thistle 175 MG tablet, Take 1,752 mg by mouth., Disp: , Rfl:    Quercetin 250 MG TABS, Take 500 mg by mouth. , Disp: , Rfl:    zinc gluconate 50 MG tablet, Take 50 mg by mouth daily., Disp: , Rfl:    Allergies: No Known Allergies  REVIEW OF SYSTEMS:   Review of Systems  Constitutional:  Negative for chills, fatigue and fever.  HENT:   Negative for lump/mass, mouth sores, nosebleeds, sore throat and trouble swallowing.   Eyes:  Negative for eye problems.  Respiratory:  Negative for cough and shortness of breath.   Cardiovascular:  Negative for chest pain, leg swelling and palpitations.  Gastrointestinal:  Negative for abdominal pain, constipation, diarrhea, nausea and vomiting.  Genitourinary:  Negative for bladder incontinence, difficulty urinating, dysuria, frequency, hematuria and nocturia.   Musculoskeletal:  Negative for arthralgias, back pain, flank pain, myalgias and neck pain.  Skin:  Negative for itching and rash.  Neurological:  Negative for dizziness, headaches and numbness.  Hematological:  Does not bruise/bleed easily.  Psychiatric/Behavioral:  Negative for depression, sleep disturbance and suicidal ideas. The patient is not  nervous/anxious.   All other systems reviewed and are negative.    VITALS:   There were no vitals taken for this visit.  Wt Readings from Last 3 Encounters:  02/25/23 145 lb 12.8 oz (66.1 kg)  02/17/23 146 lb 9.6 oz (66.5 kg)  01/26/23 131 lb (59.4 kg)    There is no height or weight on file to calculate BMI.  Performance status (ECOG): 1 - Symptomatic but completely ambulatory  PHYSICAL EXAM:   Physical Exam Vitals and nursing note reviewed. Exam conducted with a chaperone present.  Constitutional:      Appearance: Normal appearance.  Cardiovascular:     Rate and Rhythm: Normal rate and regular rhythm.     Pulses: Normal pulses.     Heart sounds: Normal heart sounds.  Pulmonary:     Effort: Pulmonary effort is normal.     Breath sounds: Normal breath sounds.  Abdominal:     Palpations: Abdomen is soft. There is no hepatomegaly, splenomegaly or mass.     Tenderness: There is no abdominal tenderness.  Musculoskeletal:     Right lower leg: No edema.     Left lower leg: No edema.  Lymphadenopathy:     Cervical: No cervical adenopathy.     Right cervical: No superficial, deep or posterior cervical adenopathy.    Left cervical: No superficial, deep or posterior cervical adenopathy.     Upper Body:     Right upper body: No supraclavicular or axillary adenopathy.     Left upper body: No supraclavicular or axillary adenopathy.  Neurological:     General: No focal deficit present.     Mental Status: She is alert and oriented to person, place, and time.  Psychiatric:        Mood and Affect: Mood normal.        Behavior: Behavior normal.     LABS:  Latest Ref Rng & Units 03/05/2023   10:03 AM 01/26/2023    1:23 AM 11/17/2022    1:44 AM  CBC  WBC 4.0 - 10.5 K/uL 4.1  4.6  5.3   Hemoglobin 12.0 - 15.0 g/dL 16.1  09.6  04.5   Hematocrit 36.0 - 46.0 % 41.2  38.9  37.2   Platelets 150 - 400 K/uL 175  145  144       Latest Ref Rng & Units 03/05/2023   10:03 AM  01/26/2023    1:23 AM 11/30/2022    9:02 AM  CMP  Glucose 70 - 99 mg/dL 91  92  87   BUN 8 - 23 mg/dL 13  17  15    Creatinine 0.44 - 1.00 mg/dL 4.09  8.11  9.14   Sodium 135 - 145 mmol/L 140  141  136   Potassium 3.5 - 5.1 mmol/L 4.0  3.7  3.8   Chloride 98 - 111 mmol/L 105  107  104   CO2 22 - 32 mmol/L 26  27  26    Calcium 8.9 - 10.3 mg/dL 9.6  9.2  9.5   Total Protein 6.5 - 8.1 g/dL 7.7   8.0   Total Bilirubin 0.3 - 1.2 mg/dL 0.6   0.7   Alkaline Phos 38 - 126 U/L 76   80   AST 15 - 41 U/L 27   28   ALT 0 - 44 U/L 24   25      Lab Results  Component Value Date   CEA1 7.6 (H) 03/05/2023   /  CEA  Date Value Ref Range Status  03/05/2023 7.6 (H) 0.0 - 4.7 ng/mL Final    Comment:    (NOTE)                             Nonsmokers          <3.9                             Smokers             <5.6 Roche Diagnostics Electrochemiluminescence Immunoassay (ECLIA) Values obtained with different assay methods or kits cannot be used interchangeably.  Results cannot be interpreted as absolute evidence of the presence or absence of malignant disease. Performed At: [facility] [address] [place], [place] [id number] Jolene Schimke MD YQ:[id number]    No results Long for: "PSA1" No results Long for: "BMW413" No results Long for: "CAN125"  No results Long for: "TOTALPROTELP", "ALBUMINELP", "A1GS", "A2GS", "BETS", "BETA2SER", "GAMS", "MSPIKE", "SPEI" Lab Results  Component Value Date   TIBC 331 06/01/2018   TIBC 393 02/16/2018   TIBC 353 11/08/2017   FERRITIN 56 06/01/2018   FERRITIN 37 02/16/2018   FERRITIN 42 11/08/2017   IRONPCTSAT 24 06/01/2018   IRONPCTSAT 20 02/16/2018   IRONPCTSAT 16 11/08/2017   Lab Results  Component Value Date   LDH 182 01/15/2020   LDH 173 09/05/2019     STUDIES:   US Abdomen Limited RUQ (LIVER/GB)  Result Date: 02/25/2023 CLINICAL DATA:  Cirrhosis EXAM: ULTRASOUND ABDOMEN LIMITED RIGHT UPPER QUADRANT COMPARISON:  CT  abdomen pelvis 11/17/2022 FINDINGS: Gallbladder: Cholelithiasis. No gallbladder wall thickening or pericholecystic fluid. Negative sonographic Murphy's sign. Common bile duct: Diameter: 4.1 mm Liver: Increased echogenicity. No focal lesion. Portal vein is patent on color Doppler imaging with normal  direction of blood flow towards the liver. Other: None. IMPRESSION: 1. Cholelithiasis without secondary signs of acute cholecystitis. 2. Increased hepatic parenchymal echogenicity suggestive of steatosis. Electronically Signed   By: Annia Belt M.D.   On: 02/25/2023 14:15

## 2023-03-15 ENCOUNTER — Inpatient Hospital Stay (HOSPITAL_BASED_OUTPATIENT_CLINIC_OR_DEPARTMENT_OTHER): Payer: Medicare Other | Admitting: Hematology

## 2023-03-15 ENCOUNTER — Encounter: Payer: Self-pay | Admitting: Hematology

## 2023-03-15 VITALS — BP 122/71 | HR 62 | Temp 97.9°F | Resp 17 | Ht 65.0 in | Wt 146.8 lb

## 2023-03-15 DIAGNOSIS — R970 Elevated carcinoembryonic antigen [CEA]: Secondary | ICD-10-CM | POA: Diagnosis not present

## 2023-03-15 DIAGNOSIS — K7460 Unspecified cirrhosis of liver: Secondary | ICD-10-CM | POA: Diagnosis not present

## 2023-03-15 DIAGNOSIS — Z8619 Personal history of other infectious and parasitic diseases: Secondary | ICD-10-CM | POA: Diagnosis not present

## 2023-03-15 DIAGNOSIS — Z9049 Acquired absence of other specified parts of digestive tract: Secondary | ICD-10-CM | POA: Diagnosis not present

## 2023-03-15 DIAGNOSIS — C183 Malignant neoplasm of hepatic flexure: Secondary | ICD-10-CM | POA: Diagnosis not present

## 2023-03-15 DIAGNOSIS — Z862 Personal history of diseases of the blood and blood-forming organs and certain disorders involving the immune mechanism: Secondary | ICD-10-CM | POA: Diagnosis not present

## 2023-03-15 DIAGNOSIS — Z85038 Personal history of other malignant neoplasm of large intestine: Secondary | ICD-10-CM | POA: Diagnosis not present

## 2023-03-15 NOTE — Patient Instructions (Addendum)
[facility] at [facility] Discharge Instructions   You were seen and examined today by Dr. Ellin Saba.  He reviewed the results of your lab work which are normal/stable.   We will see you back in 1 year.   We will repeat lab work prior to your visit.   Return as scheduled.    Thank you for choosing [facility] at [facility] to provide your oncology and hematology care.  To afford each patient quality time with our provider, please arrive at least 15 minutes before your scheduled appointment time.   If you have a lab appointment with the Cancer Center please come in thru the Main Entrance and check in at the main information desk.  You need to re-schedule your appointment should you arrive 10 or more minutes late.  We strive to give you quality time with our providers, and arriving late affects you and other patients whose appointments are after yours.  Also, if you no show three or more times for appointments you may be dismissed from the clinic at the providers discretion.     Again, thank you for choosing [facility].  Our hope is that these requests will decrease the amount of time that you wait before being seen by our physicians.       _____________________________________________________________  Should you have questions after your visit to [facility], please contact our office at [phone] and follow the prompts.  Our office hours are 8:00 a.m. and 4:30 p.m. Monday - Friday.  Please note that voicemails left after 4:00 p.m. may not be returned until the following business day.  We are closed weekends and major holidays.  You do have access to a nurse 24-7, just call the main number to the clinic [phone] and do not press any options, hold on the line and a nurse will answer the phone.    For prescription refill requests, have your pharmacy contact our office and allow 72 hours.    Due to Covid, you  will need to wear a mask upon entering the hospital. If you do not have a mask, a mask will be given to you at the Main Entrance upon arrival. For doctor visits, patients may have 1 support person age 23 or older with them. For treatment visits, patients can not have anyone with them due to social distancing guidelines and our immunocompromised population.

## 2023-03-15 NOTE — Progress Notes (Signed)
Ariana Long [address], [place] [address]    Clinic Day:  03/16/2023  Referring physician: Assunta Found, MD  Patient Care Team: Ariana Found, MD as PCP - General (Family Medicine) Ariana Stade, MD as PCP - Cardiology (Cardiology) Ariana Long Ariana Friends, MD (Gastroenterology)   ASSESSMENT & PLAN:   Assessment: 1.  Stage II (T2N0) hepatic flexure colon adenocarcinoma: -Status post right colon segmental resection on 06/15/2017, 3.2 cm tumor, 0/12 lymph nodes positive.  MMR normal/MSI low. -CEA a month after surgery was elevated at 6.3.  CT of the chest did not show any metastatic disease. -PET/CT scan on 08/16/2017 did not show any evidence of recurrence or metastatic disease.  Her CEA elevation is likely from her liver disease.  She has a history of hepatitis C and was treated for it. -CT scan of the abdomen pelvis on 02/23/2018 showed postoperative changes of partial right colectomy without definite evidence of local or metastatic disease.  There is mild narrowing in the region of hepatic flexure, presumably at surgical anastomosis. -CEA on 06/01/2018 has increased to 8.9.  Prior CEA 3 months ago was 7.7. -PET CT scan on 06/22/2018 showed no findings of recurrent or metastatic colon cancer.  Asymmetric palatine tonsillar activity, left greater than right. -CT of the abdomen pelvis on 01/11/2019 was negative for any recurrent or metastatic disease. -Patient had a colonoscopy on 01/18/2019 they Long 2 polyps that were negative for malignancy. -CEA slightly elevated at 8.0.  We will continue to monitor this closely. -Labs done on 05/31/2019 showed CEA level 8.8. -CT AP done on 09/05/2019 showed no evidence of recurrent malignancy. -Last colonoscopy was on 01/18/2019.   2.  Hepatitis C: -Patient had a blood transfusion before 1974 was told she contracted hepatitis C. -She was treated 5 years ago for hepatitis C.    Plan: 1.  Stage II (T2N0) hepatic flexure colon  adenocarcinoma: - She reports bleeding per rectum when she is constipated. - Labs from 03/05/2023: Normal LFTs and CBC.  CEA was 7.6.  Her CEA is always between 6-8, likely from history of hepatitis.  She also has fatty liver. - She has colonoscopy scheduled on 04/09/2023. - Recommend follow-up in 1 year with repeat CEA and other labs.  Imaging will be done if clinical condition dictates.   2.  Hepatitis C: - She was treated for hep C in the past.    Orders Placed This Encounter  Procedures   CBC with Differential    Standing Status:   Future    Standing Expiration Date:   03/14/2024   Comprehensive metabolic panel    Standing Status:   Future    Standing Expiration Date:   03/14/2024   CEA    Standing Status:   Future    Standing Expiration Date:   03/14/2024      I,Ariana Long,acting as a scribe for Ariana Massed, MD.,have documented all relevant documentation on the behalf of Ariana Massed, MD,as directed by  Ariana Massed, MD while in the presence of Ariana Massed, MD.   I, Ariana Massed MD, have reviewed the above documentation for accuracy and completeness, and I agree with the above.   Ariana Massed, MD   11/12/20248:59 AM  CHIEF COMPLAINT:   Diagnosis: stage II colon cancer    Cancer Staging  Primary cancer of hepatic flexure of colon [facility]) Staging form: Colon and Rectum - Neuroendocine Tumors, AJCC 8th Edition - Clinical stage from 06/15/2017: Stage IIA (cT2, cN0,  cM0) - Signed by Ariana Massed, MD on 08/18/2017    Prior Therapy: Right colon segmental resection on 06/15/2017   Current Therapy:  surveillance    HISTORY OF PRESENT ILLNESS:   Oncology History  Primary cancer of hepatic flexure of colon (HCC)  06/15/2017 Initial Diagnosis   Primary cancer of hepatic flexure of colon (HCC)   06/15/2017 Cancer Staging   Staging form: Colon and Rectum - Neuroendocine Tumors, AJCC 8th Edition - Clinical stage from  06/15/2017: Stage IIA (cT2, cN0, cM0) - Signed by Ariana Massed, MD on 08/18/2017      INTERVAL HISTORY:   Ariana Long is a 67 y.o. female presenting to clinic today for follow up of stage II colon cancer. She was last seen by me on 09/09/22.  Of note, she is scheduled for colonoscopy on 04/09/23.  Today, she states that she is doing well overall. Her appetite level is at 100%. Her energy level is at 100%.  PAST MEDICAL HISTORY:   Past Medical History: Past Medical History:  Diagnosis Date   Cirrhosis of liver (HCC) 01/26/2011   FIBROSIS on liver biopsy in 2007, U/S  03/31/2013= mild diffuse hepatic steatosis and /or hepatocellular disease without focal hepatic parenchymal abnormality. per Dr. Jacqualine Long (2012) pt is immune to hep A and Hep B. per 12/06/13 note from Liver clinic-pt had MRI which showed cirrhosis 2015.   Diverticulitis of colon    Elevated AFP 01/26/2011   Gall stones    Hematuria 12/12/2014   Hemorrhoid 12/01/2012   Hemorrhoids    Hepatitis C 01/26/2011   2005 non responder, genotype 1, Gr 1-2, Stage 3 . Treated with Harvoni at the Liver clinic, responder   Hidradenitis    History of hiatal hernia    History of kidney stones    Hx: UTI (urinary tract infection)    Kidney stones 10/2011   Osteoporosis    Palpitations    Pre-diabetes    Primary cancer of hepatic flexure of colon (HCC) 06/15/2017   Serrated adenoma of colon 11/2011   Splenomegaly 01/26/2011   Thrombocytopenia (HCC) 01/26/2011   Tubular adenoma    Vulval lesion 12/14/2013   Has kissing lesions thin and puffy and paler i color about 5-6- mm in diameter.Dr Ariana Long in to co examine will try temovate and recheck in 3 months    Surgical History: Past Surgical History:  Procedure Laterality Date   AXILLARY SURGERY Bilateral 1979   "glands removed"   BREAST BIOPSY Right 2011   BREAST EXCISIONAL BIOPSY Bilateral 25 years ago   Benign, fatty tumors   CESAREAN SECTION     x 2   COLECTOMY  06/15/2017    LAPAROSCOPIC ASSISTED RIGHT COLECTOMY, ERAS PATHWAY/notes 06/15/2017   COLONOSCOPY  07/31/2005   Rourk-Normal rectum/Normal colonoscopy/Repeat screening colonoscopy ten years   COLONOSCOPY  11/16/2011   RMR: Friable anorectum-likely source of hematochezia/ LARGE 1.2x2.5CM SERRATED ADENOMA resected from ascending colon 7CM DISTAL TO ICV, hyperplastic polyp removed    COLONOSCOPY N/A 07/18/2012   Dr. Jena Long- normal appearing rectal mucosa. no residual polpy tissue seen at tatoo location. remainder of colonic mucosa appeared entirely normal. bx = tubular adenoma   COLONOSCOPY N/A 08/29/2015   Dr. Jena Long: single 12 mm polyp in ascending colon, 4 mm polyp ascending. Sessile serrated adenoma. 3 year surveillance   COLONOSCOPY N/A 05/19/2017   4X2 cm sessile lesion at hepatic flexdure, unable to be completely removed   COLONOSCOPY N/A 01/18/2019   Dr. Jena Long: Several small polyps removed, hyperplastic.  Next colonoscopy in 5 years.   ESOPHAGEAL BANDING N/A 08/29/2015   Procedure: ESOPHAGEAL BANDING;  Surgeon: Corbin Ade, MD;  Location: [facility];  Service: Endoscopy;  Laterality: N/A;   ESOPHAGOGASTRODUODENOSCOPY N/A 08/29/2015   Dr. Jena Long: non-critical Schatzki's ring, somewhat prominent small submucosal vessels without obvious varices. hyperplastic gastric polyp. 2 year surveillance   EXTRACORPOREAL SHOCK WAVE LITHOTRIPSY Right 05/27/2021   Procedure: EXTRACORPOREAL SHOCK WAVE LITHOTRIPSY (ESWL);  Surgeon: Malen Gauze, MD;  Location: [facility];  Service: Urology;  Laterality: Right;   HEMORRHOID BANDING     LAPAROSCOPIC RIGHT COLECTOMY N/A 06/15/2017   Procedure: LAPAROSCOPIC ASSISTED RIGHT COLECTOMY, ERAS PATHWAY;  Surgeon: Claud Kelp, MD;  Location: [facility] OR;  Service: General;  Laterality: N/A;   POLYPECTOMY  05/19/2017   Procedure: POLYPECTOMY;  Surgeon: Corbin Ade, MD;  Location: [facility];  Service: Endoscopy;;   POLYPECTOMY  01/18/2019   Procedure: POLYPECTOMY;  Surgeon: Corbin Ade, MD;  Location: [facility];  Service: Endoscopy;;   SKIN LESION EXCISION      Social History: Social History   Socioeconomic History   Marital status: Married    Spouse name: Not on file   Number of children: 2   Years of education: Not on file   Highest education level: Not on file  Occupational History   Occupation: Systems analyst Op    Employer: [employer]  Tobacco Use   Smoking status: Never   Smokeless tobacco: Never  Vaping Use   Vaping status: Never Used  Substance and Sexual Activity   Alcohol use: No   Drug use: No   Sexual activity: Yes    Birth control/protection: Post-menopausal  Other Topics Concern   Not on file  Social History Narrative   Not on file   Social Determinants of Health   Financial Resource Strain: Low Risk  (04/22/2022)   Overall Financial Resource Strain (CARDIA)    Difficulty of Paying Living Expenses: Not hard at all  Food Insecurity: No Food Insecurity (04/22/2022)   Hunger Vital Sign    Worried About Running Out of Food in the Last Year: Never true    Ran Out of Food in the Last Year: Never true  Transportation Needs: No Transportation Needs (04/22/2022)   PRAPARE - Administrator, Civil Service (Medical): No    Lack of Transportation (Non-Medical): No  Physical Activity: Insufficiently Active (04/22/2022)   Exercise Vital Sign    Days of Exercise per Week: 3 days    Minutes of Exercise per Session: 30 min  Stress: No Stress Concern Present (04/22/2022)   [employer] of Occupational Health - Occupational Stress Questionnaire    Feeling of Stress : Not at all  Social Connections: Socially Integrated (04/22/2022)   Social Connection and Isolation Panel [NHANES]    Frequency of Communication with Long and Family: More than three times a week    Frequency of Social Gatherings with Long and Family: Once a week    Attends Religious Services: More than 4 times per year    Active Member of [employer] or  Organizations: Yes    Attends Banker Meetings: 1 to 4 times per year    Marital Status: Married  Catering manager Violence: Not At Risk (04/22/2022)   Humiliation, Afraid, Rape, and Kick questionnaire    Long of Current or Ex-Partner: No    Emotionally Abused: No    Physically Abused: No    Sexually Abused: No  Family History: Family History  Problem Relation Age of Onset   Heart attack Mother    Stroke Mother    Aneurysm Father    Colon cancer Neg Hx     Current Medications:  Current Outpatient Medications:    Ascorbic Acid (VITAMIN C) 1000 MG tablet, Take 1,000 mg by mouth daily., Disp: , Rfl:    Cholecalciferol (VITAMIN D3) 50 MCG (2000 UT) TABS, Take 6,000 Units by mouth daily., Disp: , Rfl:    Magnesium 250 MG TABS, Take 1 tablet by mouth daily., Disp: , Rfl:    metoprolol succinate (TOPROL XL) 25 MG 24 hr tablet, Take 0.5 tablets (12.5 mg total) by mouth daily., Disp: 45 tablet, Rfl: 3   milk thistle 175 MG tablet, Take 1,752 mg by mouth., Disp: , Rfl:    Quercetin 250 MG TABS, Take 500 mg by mouth. , Disp: , Rfl:    zinc gluconate 50 MG tablet, Take 50 mg by mouth daily., Disp: , Rfl:    Allergies: No Known Allergies  REVIEW OF SYSTEMS:   Review of Systems  Constitutional:  Negative for chills, fatigue and fever.  HENT:   Negative for lump/mass, mouth sores, nosebleeds, sore throat and trouble swallowing.   Eyes:  Negative for eye problems.  Respiratory:  Negative for cough and shortness of breath.   Cardiovascular:  Positive for palpitations. Negative for chest pain and leg swelling.  Gastrointestinal:  Negative for abdominal pain, constipation, diarrhea, nausea and vomiting.  Genitourinary:  Negative for bladder incontinence, difficulty urinating, dysuria, frequency, hematuria and nocturia.   Musculoskeletal:  Negative for arthralgias, back pain, flank pain, myalgias and neck pain.  Skin:  Negative for itching and rash.  Neurological:  Negative  for dizziness, headaches and numbness.  Hematological:  Does not bruise/bleed easily.  Psychiatric/Behavioral:  Positive for sleep disturbance. Negative for depression and suicidal ideas. The patient is not nervous/anxious.   All other systems reviewed and are negative.    VITALS:   Blood pressure 122/71, pulse 62, temperature 97.9 F (36.6 C), temperature source Oral, resp. rate 17, height 5\' 5"  (1.651 m), weight 146 lb 12.8 oz (66.6 kg), SpO2 98%.  Wt Readings from Last 3 Encounters:  03/15/23 146 lb 12.8 oz (66.6 kg)  02/25/23 145 lb 12.8 oz (66.1 kg)  02/17/23 146 lb 9.6 oz (66.5 kg)    Body mass index is 24.43 kg/m.  Performance status (ECOG): 1 - Symptomatic but completely ambulatory  PHYSICAL EXAM:   Physical Exam Vitals and nursing note reviewed. Exam conducted with a chaperone present.  Constitutional:      Appearance: Normal appearance.  Cardiovascular:     Rate and Rhythm: Normal rate and regular rhythm.     Pulses: Normal pulses.     Heart sounds: Normal heart sounds.  Pulmonary:     Effort: Pulmonary effort is normal.     Breath sounds: Normal breath sounds.  Abdominal:     Palpations: Abdomen is soft. There is no hepatomegaly, splenomegaly or mass.     Tenderness: There is no abdominal tenderness.  Musculoskeletal:     Right lower leg: No edema.     Left lower leg: No edema.  Lymphadenopathy:     Cervical: No cervical adenopathy.     Right cervical: No superficial, deep or posterior cervical adenopathy.    Left cervical: No superficial, deep or posterior cervical adenopathy.     Upper Body:     Right upper body: No supraclavicular or axillary adenopathy.  Left upper body: No supraclavicular or axillary adenopathy.  Neurological:     General: No focal deficit present.     Mental Status: She is alert and oriented to person, place, and time.  Psychiatric:        Mood and Affect: Mood normal.        Behavior: Behavior normal.     LABS:       Latest Ref Rng & Units 03/05/2023   10:03 AM 01/26/2023    1:23 AM 11/17/2022    1:44 AM  CBC  WBC 4.0 - 10.5 K/uL 4.1  4.6  5.3   Hemoglobin 12.0 - 15.0 g/dL 91.4  78.2  95.6   Hematocrit 36.0 - 46.0 % 41.2  38.9  37.2   Platelets 150 - 400 K/uL 175  145  144       Latest Ref Rng & Units 03/05/2023   10:03 AM 01/26/2023    1:23 AM 11/30/2022    9:02 AM  CMP  Glucose 70 - 99 mg/dL 91  92  87   BUN 8 - 23 mg/dL 13  17  15    Creatinine 0.44 - 1.00 mg/dL 2.13  0.86  5.78   Sodium 135 - 145 mmol/L 140  141  136   Potassium 3.5 - 5.1 mmol/L 4.0  3.7  3.8   Chloride 98 - 111 mmol/L 105  107  104   CO2 22 - 32 mmol/L 26  27  26    Calcium 8.9 - 10.3 mg/dL 9.6  9.2  9.5   Total Protein 6.5 - 8.1 g/dL 7.7   8.0   Total Bilirubin 0.3 - 1.2 mg/dL 0.6   0.7   Alkaline Phos 38 - 126 U/L 76   80   AST 15 - 41 U/L 27   28   ALT 0 - 44 U/L 24   25      Lab Results  Component Value Date   CEA1 7.6 (H) 03/05/2023   /  CEA  Date Value Ref Range Status  03/05/2023 7.6 (H) 0.0 - 4.7 ng/mL Final    Comment:    (NOTE)                             Nonsmokers          <3.9                             Smokers             <5.6 Roche [employer]ostics Electrochemiluminescence Immunoassay (ECLIA) Values obtained with different assay methods or kits cannot be used interchangeably.  Results cannot be interpreted as absolute evidence of the presence or absence of malignant disease. Performed At: [facility] [address] [place], [place] [id number] Jolene Schimke MD UX:[id number]    No results Long for: "PSA1" No results Long for: "CAN199" No results Long for: "CAN125"  No results Long for: "TOTALPROTELP", "ALBUMINELP", "A1GS", "A2GS", "BETS", "BETA2SER", "GAMS", "MSPIKE", "SPEI" Lab Results  Component Value Date   TIBC 331 06/01/2018   TIBC 393 02/16/2018   TIBC 353 11/08/2017   FERRITIN 56 06/01/2018   FERRITIN 37 02/16/2018   FERRITIN 42 11/08/2017   IRONPCTSAT 24 06/01/2018    IRONPCTSAT 20 02/16/2018   IRONPCTSAT 16 11/08/2017   Lab Results  Component Value Date   LDH 182 01/15/2020   LDH 173 09/05/2019  STUDIES:   US Abdomen Limited RUQ (LIVER/GB)  Result Date: 02/25/2023 CLINICAL DATA:  Cirrhosis EXAM: ULTRASOUND ABDOMEN LIMITED RIGHT UPPER QUADRANT COMPARISON:  CT abdomen pelvis 11/17/2022 FINDINGS: Gallbladder: Cholelithiasis. No gallbladder wall thickening or pericholecystic fluid. Negative sonographic Murphy's sign. Common bile duct: Diameter: 4.1 mm Liver: Increased echogenicity. No focal lesion. Portal vein is patent on color Doppler imaging with normal direction of blood flow towards the liver. Other: None. IMPRESSION: 1. Cholelithiasis without secondary signs of acute cholecystitis. 2. Increased hepatic parenchymal echogenicity suggestive of steatosis. Electronically Signed   By: Annia Belt M.D.   On: 02/25/2023 14:15

## 2023-03-31 ENCOUNTER — Ambulatory Visit: Payer: Medicare Other | Admitting: Gastroenterology

## 2023-04-05 NOTE — Patient Instructions (Addendum)
Ariana Long  04/05/2023     @PREFPERIOPPHARMACY @   Your procedure is scheduled on  04/09/2023.   Report to [facility] at  0600  A.M.   Call this number if you have problems the morning of surgery:  [phone]  If you experience any cold or flu symptoms such as cough, fever, chills, shortness of breath, etc. between now and your scheduled surgery, please notify us at the above number.   Remember:  Follow the diet and prep instructions given to you by the office.   You may drink clear liquids until 0330 am on 04/09/2023.     Clear liquids allowed are:                    Water, Juice (No red color; non-citric and without pulp; diabetics please choose diet or no sugar options), Carbonated beverages (diabetics please choose diet or no sugar options), Clear Tea (No creamer, milk, or cream, including half & half and powdered creamer), Black Coffee Only (No creamer, milk or cream, including half & half and powdered creamer), and Clear Sports drink (No red color; diabetics please choose diet or no sugar options)    Take these medicines the morning of surgery with A SIP OF WATER                                        metoprolol.    Do not wear jewelry, make-up or nail polish, including gel polish,  artificial nails, or any other type of covering on natural nails (fingers and  toes).  Do not wear lotions, powders, or perfumes, or deodorant.  Do not shave 48 hours prior to surgery.  Men may shave face and neck.  Do not bring valuables to the hospital.  [facility] is not responsible for any belongings or valuables.  Contacts, dentures or bridgework may not be worn into surgery.  Leave your suitcase in the car.  After surgery it may be brought to your room.  For patients admitted to the hospital, discharge time will be determined by your treatment team.  Patients discharged the day of surgery will not be allowed to drive home and must have someone with them for 24 hours.     Special instructions:   DO NOT smoke tobacco or vape for 24 hours before your procedure.  Please read over the following fact sheets that you were given. Anesthesia Post-op Instructions and Care and Recovery After Surgery       Upper Endoscopy, Adult, Care After After the procedure, it is common to have a sore throat. It is also common to have: Mild stomach pain or discomfort. Bloating. Nausea. Follow these instructions at home: The instructions below may help you care for yourself at home. Your health care provider may give you more instructions. If you have questions, ask your health care provider. If you were given a sedative during the procedure, it can affect you for several hours. Do not drive or operate machinery until your health care provider says that it is safe. If you will be going home right after the procedure, plan to have a responsible adult: Take you home from the hospital or clinic. You will not be allowed to drive. Care for you for the time you are told. Follow instructions from your health care provider about what you may eat and  drink. Return to your normal activities as told by your health care provider. Ask your health care provider what activities are safe for you. Take over-the-counter and prescription medicines only as told by your health care provider. Contact a health care provider if you: Have a sore throat that lasts longer than one day. Have trouble swallowing. Have a fever. Get help right away if you: Vomit blood or your vomit looks like coffee grounds. Have bloody, black, or tarry stools. Have a very bad sore throat or you cannot swallow. Have difficulty breathing or very bad pain in your chest or abdomen. These symptoms may be an emergency. Get help right away. Call 911. Do not wait to see if the symptoms will go away. Do not drive yourself to the hospital. Summary After the procedure, it is common to have a sore throat, mild stomach  discomfort, bloating, and nausea. If you were given a sedative during the procedure, it can affect you for several hours. Do not drive until your health care provider says that it is safe. Follow instructions from your health care provider about what you may eat and drink. Return to your normal activities as told by your health care provider. This information is not intended to replace advice given to you by your health care provider. Make sure you discuss any questions you have with your health care provider. Document Revised: 07/30/2021 Document Reviewed: 07/30/2021 Elsevier Patient Education  2024 [employer] Colonoscopy, Adult, Care After The following information offers guidance on how to care for yourself after your procedure. Your health care provider may also give you more specific instructions. If you have problems or questions, contact your health care provider. What can I expect after the procedure? After the procedure, it is common to have: A small amount of blood in your stool for 24 hours after the procedure. Some gas. Mild cramping or bloating of your abdomen. Follow these instructions at home: Eating and drinking  Drink enough fluid to keep your urine pale yellow. Follow instructions from your health care provider about eating or drinking restrictions. Resume your normal diet as told by your health care provider. Avoid heavy or fried foods that are hard to digest. Activity Rest as told by your health care provider. Avoid sitting for a long time without moving. Get up to take short walks every 1-2 hours. This is important to improve blood flow and breathing. Ask for help if you feel weak or unsteady. Return to your normal activities as told by your health care provider. Ask your health care provider what activities are safe for you. Managing cramping and bloating  Try walking around when you have cramps or feel bloated. If directed, apply heat to your abdomen as told by  your health care provider. Use the heat source that your health care provider recommends, such as a moist heat pack or a heating pad. Place a towel between your skin and the heat source. Leave the heat on for 20-30 minutes. Remove the heat if your skin turns bright red. This is especially important if you are unable to feel pain, heat, or cold. You have a greater risk of getting burned. General instructions If you were given a sedative during the procedure, it can affect you for several hours. Do not drive or operate machinery until your health care provider says that it is safe. For the first 24 hours after the procedure: Do not sign important documents. Do not drink alcohol. Do your regular daily activities at  a slower pace than normal. Eat soft foods that are easy to digest. Take over-the-counter and prescription medicines only as told by your health care provider. Keep all follow-up visits. This is important. Contact a health care provider if: You have blood in your stool 2-3 days after the procedure. Get help right away if: You have more than a small spotting of blood in your stool. You have large blood clots in your stool. You have swelling of your abdomen. You have nausea or vomiting. You have a fever. You have increasing pain in your abdomen that is not relieved with medicine. These symptoms may be an emergency. Get help right away. Call 911. Do not wait to see if the symptoms will go away. Do not drive yourself to the hospital. Summary After the procedure, it is common to have a small amount of blood in your stool. You may also have mild cramping and bloating of your abdomen. If you were given a sedative during the procedure, it can affect you for several hours. Do not drive or operate machinery until your health care provider says that it is safe. Get help right away if you have a lot of blood in your stool, nausea or vomiting, a fever, or increased pain in your abdomen. This  information is not intended to replace advice given to you by your health care provider. Make sure you discuss any questions you have with your health care provider. Document Revised: 06/02/2022 Document Reviewed: 12/11/2020 Elsevier Patient Education  2024 Elsevier Inc. Monitored Anesthesia Care, Care After The following information offers guidance on how to care for yourself after your procedure. Your health care provider may also give you more specific instructions. If you have problems or questions, contact your health care provider. What can I expect after the procedure? After the procedure, it is common to have: Tiredness. Little or no memory about what happened during or after the procedure. Impaired judgment when it comes to making decisions. Nausea or vomiting. Some trouble with balance. Follow these instructions at home: For the time period you were told by your health care provider:  Rest. Do not participate in activities where you could fall or become injured. Do not drive or use machinery. Do not drink alcohol. Do not take sleeping pills or medicines that cause drowsiness. Do not make important decisions or sign legal documents. Do not take care of children on your own. Medicines Take over-the-counter and prescription medicines only as told by your health care provider. If you were prescribed antibiotics, take them as told by your health care provider. Do not stop using the antibiotic even if you start to feel better. Eating and drinking Follow instructions from your health care provider about what you may eat and drink. Drink enough fluid to keep your urine pale yellow. If you vomit: Drink clear fluids slowly and in small amounts as you are able. Clear fluids include water, ice chips, low-calorie sports drinks, and fruit juice that has water added to it (diluted fruit juice). Eat light and bland foods in small amounts as you are able. These foods include bananas,  applesauce, rice, lean meats, toast, and crackers. General instructions  Have a responsible adult stay with you for the time you are told. It is important to have someone help care for you until you are awake and alert. If you have sleep apnea, surgery and some medicines can increase your risk for breathing problems. Follow instructions from your health care provider about wearing your sleep  device: When you are sleeping. This includes during daytime naps. While taking prescription pain medicines, sleeping medicines, or medicines that make you drowsy. Do not use any products that contain nicotine or tobacco. These products include cigarettes, chewing tobacco, and vaping devices, such as e-cigarettes. If you need help quitting, ask your health care provider. Contact a health care provider if: You feel nauseous or vomit every time you eat or drink. You feel light-headed. You are still sleepy or having trouble with balance after 24 hours. You get a rash. You have a fever. You have redness or swelling around the IV site. Get help right away if: You have trouble breathing. You have new confusion after you get home. These symptoms may be an emergency. Get help right away. Call 911. Do not wait to see if the symptoms will go away. Do not drive yourself to the hospital. This information is not intended to replace advice given to you by your health care provider. Make sure you discuss any questions you have with your health care provider. Document Revised: 09/15/2021 Document Reviewed: 09/15/2021 Elsevier Patient Education  2024 [employer].

## 2023-04-07 ENCOUNTER — Encounter (HOSPITAL_COMMUNITY)
Admission: RE | Admit: 2023-04-07 | Discharge: 2023-04-07 | Disposition: A | Discharge status: Home / Self Care | Payer: Medicare Other | Source: Ambulatory Visit | Attending: Internal Medicine | Admitting: Internal Medicine

## 2023-04-07 ENCOUNTER — Encounter (HOSPITAL_COMMUNITY): Payer: Self-pay

## 2023-04-07 VITALS — BP 107/79 | HR 58 | Temp 97.8°F | Resp 18 | Ht 65.0 in | Wt 147.0 lb

## 2023-04-07 DIAGNOSIS — Z01812 Encounter for preprocedural laboratory examination: Secondary | ICD-10-CM | POA: Diagnosis not present

## 2023-04-07 DIAGNOSIS — K7460 Unspecified cirrhosis of liver: Secondary | ICD-10-CM | POA: Diagnosis not present

## 2023-04-07 LAB — PROTIME-INR
INR: 1 (ref 0.8–1.2)
Prothrombin Time: 12.9 s (ref 11.4–15.2)

## 2023-04-09 ENCOUNTER — Ambulatory Visit (HOSPITAL_BASED_OUTPATIENT_CLINIC_OR_DEPARTMENT_OTHER): Payer: Medicare Other | Admitting: Certified Registered Nurse Anesthetist

## 2023-04-09 ENCOUNTER — Ambulatory Visit (HOSPITAL_COMMUNITY): Payer: Medicare Other | Admitting: Certified Registered Nurse Anesthetist

## 2023-04-09 ENCOUNTER — Ambulatory Visit (HOSPITAL_COMMUNITY)
Admission: RE | Admit: 2023-04-09 | Discharge: 2023-04-09 | Disposition: A | Discharge status: Home / Self Care | Payer: Medicare Other | Source: Ambulatory Visit | Attending: Internal Medicine | Admitting: Internal Medicine

## 2023-04-09 ENCOUNTER — Encounter (HOSPITAL_COMMUNITY)
Admission: RE | Disposition: A | Discharge status: Home / Self Care | Payer: Self-pay | Source: Ambulatory Visit | Attending: Internal Medicine

## 2023-04-09 ENCOUNTER — Other Ambulatory Visit: Payer: Self-pay

## 2023-04-09 ENCOUNTER — Encounter (HOSPITAL_COMMUNITY): Payer: Self-pay | Admitting: Internal Medicine

## 2023-04-09 DIAGNOSIS — Z9889 Other specified postprocedural states: Secondary | ICD-10-CM

## 2023-04-09 DIAGNOSIS — K7460 Unspecified cirrhosis of liver: Secondary | ICD-10-CM

## 2023-04-09 DIAGNOSIS — K635 Polyp of colon: Secondary | ICD-10-CM | POA: Diagnosis not present

## 2023-04-09 DIAGNOSIS — K317 Polyp of stomach and duodenum: Secondary | ICD-10-CM | POA: Insufficient documentation

## 2023-04-09 DIAGNOSIS — I10 Essential (primary) hypertension: Secondary | ICD-10-CM | POA: Insufficient documentation

## 2023-04-09 DIAGNOSIS — Z85038 Personal history of other malignant neoplasm of large intestine: Secondary | ICD-10-CM | POA: Diagnosis not present

## 2023-04-09 DIAGNOSIS — Z9049 Acquired absence of other specified parts of digestive tract: Secondary | ICD-10-CM | POA: Diagnosis not present

## 2023-04-09 DIAGNOSIS — K625 Hemorrhage of anus and rectum: Secondary | ICD-10-CM | POA: Diagnosis not present

## 2023-04-09 DIAGNOSIS — K640 First degree hemorrhoids: Secondary | ICD-10-CM | POA: Insufficient documentation

## 2023-04-09 DIAGNOSIS — I8510 Secondary esophageal varices without bleeding: Secondary | ICD-10-CM | POA: Insufficient documentation

## 2023-04-09 DIAGNOSIS — K921 Melena: Secondary | ICD-10-CM | POA: Diagnosis not present

## 2023-04-09 HISTORY — PX: COLONOSCOPY WITH PROPOFOL: SHX5780

## 2023-04-09 HISTORY — PX: ESOPHAGOGASTRODUODENOSCOPY (EGD) WITH PROPOFOL: SHX5813

## 2023-04-09 HISTORY — PX: POLYPECTOMY: SHX5525

## 2023-04-09 LAB — GLUCOSE, CAPILLARY: Glucose-Capillary: 84 mg/dL (ref 70–99)

## 2023-04-09 SURGERY — COLONOSCOPY WITH PROPOFOL
Anesthesia: General

## 2023-04-09 MED ORDER — PROPOFOL 10 MG/ML IV BOLUS
INTRAVENOUS | Status: DC | PRN
  Administered 2023-04-09: 80 mg via INTRAVENOUS

## 2023-04-09 MED ORDER — LACTATED RINGERS IV SOLN: INTRAVENOUS | Status: DC

## 2023-04-09 MED ORDER — PROPOFOL 500 MG/50ML IV EMUL
INTRAVENOUS | Status: DC | PRN
  Administered 2023-04-09: 180 ug/kg/min via INTRAVENOUS

## 2023-04-09 MED ORDER — PROPOFOL 500 MG/50ML IV EMUL
INTRAVENOUS | Status: AC
Start: 2023-04-09 — End: ?
  Filled 2023-04-09: qty 100

## 2023-04-09 MED ORDER — LIDOCAINE HCL (PF) 2 % IJ SOLN
INTRAMUSCULAR | Status: DC | PRN
  Administered 2023-04-09: 50 mg via INTRADERMAL

## 2023-04-09 MED ORDER — LIDOCAINE HCL (PF) 2 % IJ SOLN
INTRAMUSCULAR | Status: AC
  Filled 2023-04-09: qty 5

## 2023-04-09 NOTE — Anesthesia Postprocedure Evaluation (Signed)
Anesthesia Post Note  Patient: DEZIYA DIERSEN  Procedure(s) Performed: COLONOSCOPY WITH PROPOFOL ESOPHAGOGASTRODUODENOSCOPY (EGD) WITH PROPOFOL POLYPECTOMY  Patient location during evaluation: Phase II Anesthesia Type: General Level of consciousness: awake Pain management: pain level controlled Vital Signs Assessment: post-procedure vital signs reviewed and stable Respiratory status: spontaneous breathing and respiratory function stable Cardiovascular status: blood pressure returned to baseline and stable Postop Assessment: no headache and no apparent nausea or vomiting Anesthetic complications: no Comments: Late entry   No notable events documented.   Last Vitals:  Vitals:   04/09/23 0807 04/09/23 0809  BP: (!) 88/53 92/63  Pulse: 76   Resp: 18   Temp: (!) 36.4 C   SpO2: 99%     Last Pain:  Vitals:   04/09/23 0807  TempSrc: Oral  PainSc: 0-No pain                 Windell Norfolk

## 2023-04-09 NOTE — Anesthesia Preprocedure Evaluation (Signed)
Anesthesia Evaluation  Patient identified by MRN, date of birth, ID band Patient awake    Reviewed: Allergy & Precautions, H&P , NPO status , Patient's Chart, lab work & pertinent test results, reviewed documented beta blocker date and time   Airway Mallampati: II  TM Distance: >3 FB Neck ROM: full    Dental no notable dental hx.    Pulmonary neg pulmonary ROS   Pulmonary exam normal breath sounds clear to auscultation       Cardiovascular Exercise Tolerance: Good hypertension, negative cardio ROS + Valvular Problems/Murmurs  Rhythm:regular Rate:Normal     Neuro/Psych  Neuromuscular disease negative neurological ROS  negative psych ROS   GI/Hepatic negative GI ROS, Neg liver ROS, hiatal hernia,GERD  ,,(+) Hepatitis -  Endo/Other  negative endocrine ROS    Renal/GU Renal diseasenegative Renal ROS  negative genitourinary   Musculoskeletal   Abdominal   Peds  Hematology negative hematology ROS (+)   Anesthesia Other Findings   Reproductive/Obstetrics negative OB ROS                             Anesthesia Physical Anesthesia Plan  ASA: 3  Anesthesia Plan: General   Post-op Pain Management:    Induction:   PONV Risk Score and Plan: Propofol infusion  Airway Management Planned:   Additional Equipment:   Intra-op Plan:   Post-operative Plan:   Informed Consent: I have reviewed the patients History and Physical, chart, labs and discussed the procedure including the risks, benefits and alternatives for the proposed anesthesia with the patient or authorized representative who has indicated his/her understanding and acceptance.     Dental Advisory Given  Plan Discussed with: CRNA  Anesthesia Plan Comments:        Anesthesia Quick Evaluation

## 2023-04-09 NOTE — Op Note (Signed)
[facility] Patient Name: Ariana Long Procedure Date: 04/09/2023 7:11 AM MRN: [id number] Date of Birth: 10/06/1955 Attending MD: Gennette Pac , MD, [id number] CSN: [id number] Age: 67 Admit Type: Outpatient Procedure:                Upper GI endoscopy Indications:              Cirrhosis rule out esophageal varices Providers:                Gennette Pac, MD, Nena Polio, RN, Lennice Sites Technician, Technician Referring MD:              Medicines:                Propofol per Anesthesia Complications:            No immediate complications. Estimated Blood Loss:     Estimated blood loss: none. Procedure:                Pre-Anesthesia Assessment:                           - Prior to the procedure, a History and Physical                            was performed, and patient medications and                            allergies were reviewed. The patient's tolerance of                            previous anesthesia was also reviewed. The risks                            and benefits of the procedure and the sedation                            options and risks were discussed with the patient.                            All questions were answered, and informed consent                            was obtained. Prior Anticoagulants: The patient has                            taken no anticoagulant or antiplatelet agents. ASA                            Grade Assessment: III - A patient with severe                            systemic disease. After reviewing the risks and  benefits, the patient was deemed in satisfactory                            condition to undergo the procedure.                           After obtaining informed consent, the endoscope was                            passed under direct vision. Throughout the                            procedure, the patient's blood pressure, pulse, and                             oxygen saturations were monitored continuously. The                            [id number]-H190 ([id number]) scope was introduced through the                            mouth, and advanced to the second part of duodenum.                            The upper GI endoscopy was accomplished without                            difficulty. The patient tolerated the procedure                            well. Scope In: 7:36:48 AM Scope Out: 7:42:12 AM Total Procedure Duration: 0 hours 5 minutes 24 seconds  Findings:      Single short grade 2-3 varix distal esophagus no bleeding stigmata       remainder of esophagus appeared normal. See photos. Gastric cavity       empty. Single 3 mm polyp on the gastric body. Otherwise, gastric mucosa.       Appeared normal. Patent pylorus.      The duodenal bulb and second portion of the duodenum were normal.       Gastric polyp was removed with cold biopsy forceps. Impression:               Single short grade 2/grade 3 varix in the                            esophagus. No bleeding stigmata. Gastric polyp                            status post polypectomy-                           Normal duodenal bulb and second portion of the                            duodenum. Moderate Sedation:  Moderate (conscious) sedation was personally administered by an       anesthesia professional. The following parameters were monitored: oxygen       saturation, heart rate, blood pressure, respiratory rate, EKG, adequacy       of pulmonary ventilation, and response to care. Recommendation:           - Patient has a contact number available for                            emergencies. The signs and symptoms of potential                            delayed complications were discussed with the                            patient. Return to normal activities tomorrow.                            Written discharge instructions were provided to the                            patient.                            - Advance diet as tolerated. Follow-up on                            pathology. Patient needs to go on a nonselective                            beta-blocker. She is already on metoprolol. Will                            check with the PCP to see we can make this switch.                            Office visit in 6 weeks no future EGD needed unless                            patient develops new symptoms Procedure Code(s):        --- Professional ---                           [phone], Esophagogastroduodenoscopy, flexible,                            transoral; diagnostic, including collection of                            specimen(s) by brushing or washing, when performed                            (separate procedure) Diagnosis Code(s):        --- Professional ---  K74.60, Unspecified cirrhosis of liver CPT copyright 2022 American Medical Association. All rights reserved. The codes documented in this report are preliminary and upon coder review may  be revised to meet current compliance requirements. Gerrit Friends. Artelia Game, MD Gennette Pac, MD 04/09/2023 8:05:45 AM This report has been signed electronically. Number of Addenda: 0

## 2023-04-09 NOTE — Discharge Instructions (Addendum)
EGD Discharge instructions Please read the instructions outlined below and refer to this sheet in the next few weeks. These discharge instructions provide you with general information on caring for yourself after you leave the hospital. Your doctor may also give you specific instructions. While your treatment has been planned according to the most current medical practices available, unavoidable complications occasionally occur. If you have any problems or questions after discharge, please call your doctor. ACTIVITY You may resume your regular activity but move at a slower pace for the next 24 hours.  Take frequent rest periods for the next 24 hours.  Walking will help expel (get rid of) the air and reduce the bloated feeling in your abdomen.  No driving for 24 hours (because of the anesthesia (medicine) used during the test).  You may shower.  Do not sign any important legal documents or operate any machinery for 24 hours (because of the anesthesia used during the test).  NUTRITION Drink plenty of fluids.  You may resume your normal diet.  Begin with a light meal and progress to your normal diet.  Avoid alcoholic beverages for 24 hours or as instructed by your caregiver.  MEDICATIONS You may resume your normal medications unless your caregiver tells you otherwise.  WHAT YOU CAN EXPECT TODAY You may experience abdominal discomfort such as a feeling of fullness or "gas" pains.  FOLLOW-UP Your doctor will discuss the results of your test with you.  SEEK IMMEDIATE MEDICAL ATTENTION IF ANY OF THE FOLLOWING OCCUR: Excessive nausea (feeling sick to your stomach) and/or vomiting.  Severe abdominal pain and distention (swelling).  Trouble swallowing.  Temperature over 101 F (37.8 C).  Rectal bleeding or vomiting of blood.    Colonoscopy Discharge Instructions  Read the instructions outlined below and refer to this sheet in the next few weeks. These discharge instructions provide you with  general information on caring for yourself after you leave the hospital. Your doctor may also give you specific instructions. While your treatment has been planned according to the most current medical practices available, unavoidable complications occasionally occur. If you have any problems or questions after discharge, call Dr. Jena Gauss at [phone]. ACTIVITY You may resume your regular activity, but move at a slower pace for the next 24 hours.  Take frequent rest periods for the next 24 hours.  Walking will help get rid of the air and reduce the bloated feeling in your belly (abdomen).  No driving for 24 hours (because of the medicine (anesthesia) used during the test).   Do not sign any important legal documents or operate any machinery for 24 hours (because of the anesthesia used during the test).  NUTRITION Drink plenty of fluids.  You may resume your normal diet as instructed by your doctor.  Begin with a light meal and progress to your normal diet. Heavy or fried foods are harder to digest and may make you feel sick to your stomach (nauseated).  Avoid alcoholic beverages for 24 hours or as instructed.  MEDICATIONS You may resume your normal medications unless your doctor tells you otherwise.  WHAT YOU CAN EXPECT TODAY Some feelings of bloating in the abdomen.  Passage of more gas than usual.  Spotting of blood in your stool or on the toilet paper.  IF YOU HAD POLYPS REMOVED DURING THE COLONOSCOPY: No aspirin products for 7 days or as instructed.  No alcohol for 7 days or as instructed.  Eat a soft diet for the next 24 hours.  FINDING  OUT THE RESULTS OF YOUR TEST Not all test results are available during your visit. If your test results are not back during the visit, make an appointment with your caregiver to find out the results. Do not assume everything is normal if you have not heard from your caregiver or the medical facility. It is important for you to follow up on all of your test  results.  SEEK IMMEDIATE MEDICAL ATTENTION IF: You have more than a spotting of blood in your stool.  Your belly is swollen (abdominal distention).  You are nauseated or vomiting.  You have a temperature over 101.  You have abdominal pain or discomfort that is severe or gets worse throughout the day.       Small area of esophageal varices.  1 gastric polyp removed  Colon looked good  - 1 tiny polyp removed.   Small Hemorrhoids.  Likely site of bleeding.  We need to adjust your medications to decrease the risk of bleeding ; will need to check with your primary care physician.  Will make further recommendations next week.  Further recommendations also to follow after the biopsy report returns   at patient Request, I called Ariana Long [phone] -  reviewed findings and recommendations

## 2023-04-09 NOTE — Transfer of Care (Signed)
Immediate Anesthesia Transfer of Care Note  Patient: Ariana Long  Procedure(s) Performed: COLONOSCOPY WITH PROPOFOL ESOPHAGOGASTRODUODENOSCOPY (EGD) WITH PROPOFOL  Patient Location: Short Stay  Anesthesia Type:General  Level of Consciousness: awake, alert , and oriented  Airway & Oxygen Therapy: Patient Spontanous Breathing  Post-op Assessment: Report given to RN, Post -op Vital signs reviewed and stable, Patient moving all extremities X 4, and Patient able to stick tongue midline  Post vital signs: Reviewed and stable  Last Vitals:  Vitals Value Taken Time  BP 88/53 04/09/23 0807  Temp 36.4 C 04/09/23 0807  Pulse 76 04/09/23 0807  Resp 18 04/09/23 0807  SpO2 99 % 04/09/23 0807    Last Pain:  Vitals:   04/09/23 0807  TempSrc: Oral  PainSc: 0-No pain         Complications: No notable events documented.

## 2023-04-09 NOTE — Op Note (Signed)
[facility] Patient Name: Ariana Long Procedure Date: 04/09/2023 7:01 AM MRN: [id number] Date of Birth: May 08, 1955 Attending MD: Gennette Pac , MD, [id number] CSN: [id number] Age: 67 Admit Type: Outpatient Procedure:                Colonoscopy Indications:              Hematochezia Providers:                Gennette Pac, MD, Nena Polio, RN, Lennice Sites Technician, Technician Referring MD:              Medicines:                Propofol per Anesthesia Complications:            No immediate complications. Estimated Blood Loss:     Estimated blood loss was minimal. Procedure:                Pre-Anesthesia Assessment:                           - Prior to the procedure, a History and Physical                            was performed, and patient medications and                            allergies were reviewed. The patient's tolerance of                            previous anesthesia was also reviewed. The risks                            and benefits of the procedure and the sedation                            options and risks were discussed with the patient.                            All questions were answered, and informed consent                            was obtained. Prior Anticoagulants: The patient has                            taken no anticoagulant or antiplatelet agents. ASA                            Grade Assessment: III - A patient with severe                            systemic disease. After reviewing the risks and  benefits, the patient was deemed in satisfactory                            condition to undergo the procedure.                           After obtaining informed consent, the colonoscope                            was passed under direct vision. Throughout the                            procedure, the patient's blood pressure, pulse, and                            oxygen  saturations were monitored continuously. The                            [id number]) scope was introduced through                            the anus and advanced to the the ileocolonic                            anastomosis. The colonoscopy was performed without                            difficulty. The patient tolerated the procedure                            well. The quality of the bowel preparation was                            adequate. The ileocecal valve, appendiceal orifice,                            and rectum were photographed. The colonoscopy was                            performed without difficulty. The entire colon was                            well visualized. Scope In: 7:48:24 AM Scope Out: 7:59:56 AM Scope Withdrawal Time: 0 hours 7 minutes 54 seconds  Total Procedure Duration: 0 hours 11 minutes 32 seconds  Findings:      The perianal and digital rectal examinations were normal. Patient's       status post right hemicolectomy previously. Anastomosis appears normal.      A 4 mm polyp was found in the mid sigmoid colon. The polyp was sessile.       The polyp was removed with a cold snare. Resection and retrieval were       complete. Estimated blood loss was minimal.      Non-bleeding internal hemorrhoids were found during retroflexion. The       hemorrhoids were mild,  small and Grade I (internal hemorrhoids that do       not prolapse). Impression:               - One 4 mm polyp in the mid sigmoid colon, removed                            with a cold snare. Resected and retrieved.                           - Non-bleeding internal hemorrhoids. Status post                            right hemicolectomy                           -I suspect benign anorectal bleeding. Moderate Sedation:      Moderate (conscious) sedation was personally administered by an       anesthesia professional. The following parameters were monitored: oxygen       saturation, heart rate,  blood pressure, respiratory rate, EKG, adequacy       of pulmonary ventilation, and response to care. Recommendation:           - Patient has a contact number available for                            emergencies. The signs and symptoms of potential                            delayed complications were discussed with the                            patient. Return to normal activities tomorrow.                            Written discharge instructions were provided to the                            patient.                           - Advance diet as tolerated. Follow-up on                            pathology. See EGD report. Procedure Code(s):        --- Professional ---                           [id number], Colonoscopy, flexible; with removal of                            tumor(s), polyp(s), or other lesion(s) by snare                            technique Diagnosis Code(s):        --- Professional ---  D12.5, Benign neoplasm of sigmoid colon                           K64.0, First degree hemorrhoids                           K92.1, Melena (includes Hematochezia) CPT copyright 2022 American Medical Association. All rights reserved. The codes documented in this report are preliminary and upon coder review may  be revised to meet current compliance requirements. Gerrit Friends. Xaniyah Buchholz, MD Gennette Pac, MD 04/09/2023 8:10:44 AM This report has been signed electronically. Number of Addenda: 0

## 2023-04-09 NOTE — H&P (Signed)
@LOGO @   Primary Care Physician:  Assunta Found, MD Primary Gastroenterologist:  Dr. Jena Gauss  Pre-Procedure History & Physical: HPI:  Ariana Long is a 67 y.o. female here for further evaluation of rectal bleeding via colonoscopy history of right hemicolectomy for colon cancer.  And varices screening examination history of hep C eradicated and/cirrhosis.  Past Medical History:  Diagnosis Date   Cirrhosis of liver (HCC) 01/26/2011   FIBROSIS on liver biopsy in 2007, U/S  03/31/2013= mild diffuse hepatic steatosis and /or hepatocellular disease without focal hepatic parenchymal abnormality. per Dr. Jacqualine Mau (2012) pt is immune to hep A and Hep B. per 12/06/13 note from Liver clinic-pt had MRI which showed cirrhosis 2015.   Diverticulitis of colon    Elevated AFP 01/26/2011   Gall stones    Hematuria 12/12/2014   Hemorrhoid 12/01/2012   Hemorrhoids    Hepatitis C 01/26/2011   2005 non responder, genotype 1, Gr 1-2, Stage 3 . Treated with Harvoni at the Liver clinic, responder   Hidradenitis    History of hiatal hernia    History of kidney stones    Hx: UTI (urinary tract infection)    Kidney stones 10/2011   Osteoporosis    Palpitations    Pre-diabetes    Primary cancer of hepatic flexure of colon (HCC) 06/15/2017   Serrated adenoma of colon 11/2011   Splenomegaly 01/26/2011   Thrombocytopenia (HCC) 01/26/2011   Tubular adenoma    Vulval lesion 12/14/2013   Has kissing lesions thin and puffy and paler i color about 5-6- mm in diameter.Dr Emelda Fear in to co examine will try temovate and recheck in 3 months    Past Surgical History:  Procedure Laterality Date   AXILLARY SURGERY Bilateral 1979   "glands removed"   BREAST BIOPSY Right 2011   BREAST EXCISIONAL BIOPSY Bilateral 25 years ago   Benign, fatty tumors   CESAREAN SECTION     x 2   COLECTOMY  06/15/2017   LAPAROSCOPIC ASSISTED RIGHT COLECTOMY, ERAS PATHWAY/notes 06/15/2017   COLONOSCOPY  07/31/2005   Draken Farrior-Normal  rectum/Normal colonoscopy/Repeat screening colonoscopy ten years   COLONOSCOPY  11/16/2011   RMR: Friable anorectum-likely source of hematochezia/ LARGE 1.2x2.5CM SERRATED ADENOMA resected from ascending colon 7CM DISTAL TO ICV, hyperplastic polyp removed    COLONOSCOPY N/A 07/18/2012   Dr. Jena Gauss- normal appearing rectal mucosa. no residual polpy tissue seen at tatoo location. remainder of colonic mucosa appeared entirely normal. bx = tubular adenoma   COLONOSCOPY N/A 08/29/2015   Dr. Jena Gauss: single 12 mm polyp in ascending colon, 4 mm polyp ascending. Sessile serrated adenoma. 3 year surveillance   COLONOSCOPY N/A 05/19/2017   4X2 cm sessile lesion at hepatic flexdure, unable to be completely removed   COLONOSCOPY N/A 01/18/2019   Dr. Jena Gauss: Several small polyps removed, hyperplastic.  Next colonoscopy in 5 years.   ESOPHAGEAL BANDING N/A 08/29/2015   Procedure: ESOPHAGEAL BANDING;  Surgeon: Corbin Ade, MD;  Location: [facility];  Service: Endoscopy;  Laterality: N/A;   ESOPHAGOGASTRODUODENOSCOPY N/A 08/29/2015   Dr. Jena Gauss: non-critical Schatzki's ring, somewhat prominent small submucosal vessels without obvious varices. hyperplastic gastric polyp. 2 year surveillance   EXTRACORPOREAL SHOCK WAVE LITHOTRIPSY Right 05/27/2021   Procedure: EXTRACORPOREAL SHOCK WAVE LITHOTRIPSY (ESWL);  Surgeon: Malen Gauze, MD;  Location: [facility];  Service: Urology;  Laterality: Right;   HEMORRHOID BANDING     LAPAROSCOPIC RIGHT COLECTOMY N/A 06/15/2017   Procedure: LAPAROSCOPIC ASSISTED RIGHT COLECTOMY, ERAS PATHWAY;  Surgeon: Claud Kelp,  MD;  Location: [facility] OR;  Service: General;  Laterality: N/A;   POLYPECTOMY  05/19/2017   Procedure: POLYPECTOMY;  Surgeon: Corbin Ade, MD;  Location: [facility];  Service: Endoscopy;;   POLYPECTOMY  01/18/2019   Procedure: POLYPECTOMY;  Surgeon: Corbin Ade, MD;  Location: [facility];  Service: Endoscopy;;   SKIN LESION EXCISION      Prior to Admission  medications   Medication Sig Start Date End Date Taking? Authorizing Provider  Ascorbic Acid (VITAMIN C) 1000 MG tablet Take 1,000 mg by mouth daily.   Yes [provider]  Cholecalciferol (VITAMIN D3) 50 MCG (2000 UT) TABS Take 6,000 Units by mouth daily.   Yes [provider]  Magnesium 250 MG TABS Take 1 tablet by mouth daily.   Yes [provider]  metoprolol succinate (TOPROL XL) 25 MG 24 hr tablet Take 0.5 tablets (12.5 mg total) by mouth daily. 02/25/23  Yes Strader, Lennart Pall, PA-C  Quercetin 250 MG TABS Take 500 mg by mouth.    Yes [provider]  zinc gluconate 50 MG tablet Take 50 mg by mouth daily.   Yes [provider]  milk thistle 175 MG tablet Take 1,752 mg by mouth.    [provider]    Allergies as of 02/18/2023   (No Known Allergies)    Family History  Problem Relation Age of Onset   Heart attack Mother    Stroke Mother    Aneurysm Father    Colon cancer Neg Hx     Social History   Socioeconomic History   Marital status: Married    Spouse name: Not on file   Number of children: 2   Years of education: Not on file   Highest education level: Not on file  Occupational History   Occupation: Systems analyst Op    Employer: [employer]  Tobacco Use   Smoking status: Never   Smokeless tobacco: Never  Vaping Use   Vaping status: Never Used  Substance and Sexual Activity   Alcohol use: No   Drug use: No   Sexual activity: Yes    Birth control/protection: Post-menopausal  Other Topics Concern   Not on file  Social History Narrative   Not on file   Social Determinants of Health   Financial Resource Strain: Low Risk  (04/22/2022)   Overall Financial Resource Strain (CARDIA)    Difficulty of Paying Living Expenses: Not hard at all  Food Insecurity: No Food Insecurity (04/22/2022)   Hunger Vital Sign    Worried About Running Out of Food in the Last Year: Never true    Ran Out of Food in the Last Year:  Never true  Transportation Needs: No Transportation Needs (04/22/2022)   PRAPARE - Administrator, Civil Service (Medical): No    Lack of Transportation (Non-Medical): No  Physical Activity: Insufficiently Active (04/22/2022)   Exercise Vital Sign    Days of Exercise per Week: 3 days    Minutes of Exercise per Session: 30 min  Stress: No Stress Concern Present (04/22/2022)   [employer] of Occupational Health - Occupational Stress Questionnaire    Feeling of Stress : Not at all  Social Connections: Socially Integrated (04/22/2022)   Social Connection and Isolation Panel [NHANES]    Frequency of Communication with Friends and Family: More than three times a week    Frequency of Social Gatherings with Friends and Family: Once a week    Attends Religious  Services: More than 4 times per year    Active Member of Clubs or Organizations: Yes    Attends Banker Meetings: 1 to 4 times per year    Marital Status: Married  Catering manager Violence: Not At Risk (04/22/2022)   Humiliation, Afraid, Rape, and Kick questionnaire    Fear of Current or Ex-Partner: No    Emotionally Abused: No    Physically Abused: No    Sexually Abused: No    Review of Systems: See HPI, otherwise negative ROS  Physical Exam: BP 113/70   Pulse (!) 2   Temp 98.1 F (36.7 C) (Oral)   Resp 14   Ht 5\' 5"  (1.651 m)   Wt 66.7 kg   SpO2 100%   BMI 24.47 kg/m  General:   Alert,  Well-developed, well-nourished, pleasant and cooperative in NAD Neck:  Supple; no masses or thyromegaly. No significant cervical adenopathy. Lungs:  Clear throughout to auscultation.   No wheezes, crackles, or rhonchi. No acute distress. Heart:  Regular rate and rhythm; no murmurs, clicks, rubs,  or gallops. Abdomen: Non-distended, normal bowel sounds.  Soft and nontender without appreciable mass or hepatosplenomegaly.   Impression: 67 year old lady with history of colon cancer status post right  hemicolectomy.  Now with rectal bleeding.  Also history of cirrhosis secondary to hep C here for screening EGD. The risks, benefits, limitations, imponderables and alternatives regarding both EGD and colonoscopy have been reviewed with the patient. Questions have been answered. All parties agreeable.      Notice: This dictation was prepared with Dragon dictation along with smaller phrase technology. Any transcriptional errors that result from this process are unintentional and may not be corrected upon review.

## 2023-04-12 ENCOUNTER — Encounter: Payer: Self-pay | Admitting: Internal Medicine

## 2023-04-12 ENCOUNTER — Other Ambulatory Visit: Payer: Self-pay | Admitting: Gastroenterology

## 2023-04-12 ENCOUNTER — Telehealth: Payer: Self-pay | Admitting: Gastroenterology

## 2023-04-12 LAB — SURGICAL PATHOLOGY

## 2023-04-12 MED ORDER — NADOLOL 20 MG PO TABS: 20.0000 mg | ORAL_TABLET | Freq: Every day | ORAL | 2 refills | Status: DC

## 2023-04-12 NOTE — Telephone Encounter (Signed)
Please let patient know that I spoke with cardiology and given a single esophageal varix noted on her EGD they are okay with the switching her from metoprolol daily to nadolol.  She should stop taking metoprolol 12.5 mg daily and she should start taking nadolol 20 mg once daily.  Heart rate goal is 55-60 beats per minute. She is to let me know if her BP drop below 100/60.  Brooke Bonito, MSN, APRN, FNP-BC, AGACNP-BC [facility] at [facility]

## 2023-04-12 NOTE — Telephone Encounter (Signed)
Noted  

## 2023-04-13 ENCOUNTER — Other Ambulatory Visit (HOSPITAL_COMMUNITY): Payer: Self-pay | Admitting: Adult Health

## 2023-04-13 DIAGNOSIS — Z1231 Encounter for screening mammogram for malignant neoplasm of breast: Secondary | ICD-10-CM

## 2023-04-19 ENCOUNTER — Encounter (HOSPITAL_COMMUNITY): Payer: Self-pay | Admitting: Internal Medicine

## 2023-05-03 ENCOUNTER — Telehealth: Payer: Self-pay

## 2023-05-03 ENCOUNTER — Other Ambulatory Visit: Payer: Self-pay | Admitting: Gastroenterology

## 2023-05-03 DIAGNOSIS — Z6822 Body mass index (BMI) 22.0-22.9, adult: Secondary | ICD-10-CM | POA: Diagnosis not present

## 2023-05-03 DIAGNOSIS — D696 Thrombocytopenia, unspecified: Secondary | ICD-10-CM | POA: Diagnosis not present

## 2023-05-03 DIAGNOSIS — D509 Iron deficiency anemia, unspecified: Secondary | ICD-10-CM | POA: Diagnosis not present

## 2023-05-03 DIAGNOSIS — Z1331 Encounter for screening for depression: Secondary | ICD-10-CM | POA: Diagnosis not present

## 2023-05-03 DIAGNOSIS — K7460 Unspecified cirrhosis of liver: Secondary | ICD-10-CM

## 2023-05-03 DIAGNOSIS — E782 Mixed hyperlipidemia: Secondary | ICD-10-CM | POA: Diagnosis not present

## 2023-05-03 DIAGNOSIS — E119 Type 2 diabetes mellitus without complications: Secondary | ICD-10-CM | POA: Diagnosis not present

## 2023-05-03 DIAGNOSIS — E1159 Type 2 diabetes mellitus with other circulatory complications: Secondary | ICD-10-CM | POA: Diagnosis not present

## 2023-05-03 DIAGNOSIS — M5030 Other cervical disc degeneration, unspecified cervical region: Secondary | ICD-10-CM | POA: Diagnosis not present

## 2023-05-03 DIAGNOSIS — Z0001 Encounter for general adult medical examination with abnormal findings: Secondary | ICD-10-CM | POA: Diagnosis not present

## 2023-05-03 DIAGNOSIS — B182 Chronic viral hepatitis C: Secondary | ICD-10-CM | POA: Diagnosis not present

## 2023-05-03 DIAGNOSIS — E559 Vitamin D deficiency, unspecified: Secondary | ICD-10-CM | POA: Diagnosis not present

## 2023-05-03 MED ORDER — CARVEDILOL 3.125 MG PO TABS: 3.1250 mg | ORAL_TABLET | Freq: Two times a day (BID) | ORAL | 3 refills | Status: DC

## 2023-05-03 NOTE — Telephone Encounter (Signed)
Pt was made aware and verbalized understanding.  

## 2023-05-03 NOTE — Telephone Encounter (Signed)
Pt called stating that the Nadalol is giving her diarrhea and wants to know what you suggest.

## 2023-05-10 ENCOUNTER — Ambulatory Visit (HOSPITAL_COMMUNITY): Payer: Medicare Other

## 2023-05-13 ENCOUNTER — Other Ambulatory Visit: Payer: Self-pay | Admitting: *Deleted

## 2023-05-13 DIAGNOSIS — K7460 Unspecified cirrhosis of liver: Secondary | ICD-10-CM

## 2023-05-14 ENCOUNTER — Ambulatory Visit (HOSPITAL_COMMUNITY)
Admission: RE | Admit: 2023-05-14 | Discharge: 2023-05-14 | Disposition: A | Discharge status: Home / Self Care | Payer: Medicare Other | Source: Ambulatory Visit | Attending: Adult Health | Admitting: Adult Health

## 2023-05-14 DIAGNOSIS — Z1231 Encounter for screening mammogram for malignant neoplasm of breast: Secondary | ICD-10-CM | POA: Diagnosis not present

## 2023-05-18 ENCOUNTER — Encounter: Payer: Self-pay | Admitting: Internal Medicine

## 2023-05-21 ENCOUNTER — Ambulatory Visit (INDEPENDENT_AMBULATORY_CARE_PROVIDER_SITE_OTHER): Payer: Medicare Other | Admitting: Adult Health

## 2023-05-21 ENCOUNTER — Encounter: Payer: Self-pay | Admitting: Adult Health

## 2023-05-21 ENCOUNTER — Other Ambulatory Visit (HOSPITAL_COMMUNITY)
Admission: RE | Admit: 2023-05-21 | Discharge: 2023-05-21 | Disposition: A | Discharge status: Home / Self Care | Payer: Medicare Other | Source: Ambulatory Visit | Attending: Gastroenterology | Admitting: Gastroenterology

## 2023-05-21 VITALS — BP 115/72 | HR 64 | Ht 65.5 in | Wt 145.0 lb

## 2023-05-21 DIAGNOSIS — Z1331 Encounter for screening for depression: Secondary | ICD-10-CM

## 2023-05-21 DIAGNOSIS — L439 Lichen planus, unspecified: Secondary | ICD-10-CM

## 2023-05-21 DIAGNOSIS — Z0000 Encounter for general adult medical examination without abnormal findings: Secondary | ICD-10-CM | POA: Diagnosis not present

## 2023-05-21 DIAGNOSIS — Z01419 Encounter for gynecological examination (general) (routine) without abnormal findings: Secondary | ICD-10-CM

## 2023-05-21 DIAGNOSIS — K7460 Unspecified cirrhosis of liver: Secondary | ICD-10-CM | POA: Insufficient documentation

## 2023-05-21 LAB — COMPREHENSIVE METABOLIC PANEL
ALT: 25 U/L (ref 0–44)
AST: 30 U/L (ref 15–41)
Albumin: 4 g/dL (ref 3.5–5.0)
Alkaline Phosphatase: 73 U/L (ref 38–126)
Anion gap: 5 (ref 5–15)
BUN: 16 mg/dL (ref 8–23)
CO2: 27 mmol/L (ref 22–32)
Calcium: 9.2 mg/dL (ref 8.9–10.3)
Chloride: 106 mmol/L (ref 98–111)
Creatinine, Ser: 0.79 mg/dL (ref 0.44–1.00)
GFR, Estimated: >60 mL/min (ref >60)
Glucose, Bld: 106 mg/dL — ABNORMAL HIGH (ref 70–99)
Potassium: 3.6 mmol/L (ref 3.5–5.1)
Sodium: 138 mmol/L (ref 135–145)
Total Bilirubin: 0.5 mg/dL (ref 0.0–1.2)
Total Protein: 7.5 g/dL (ref 6.5–8.1)

## 2023-05-21 LAB — PROTIME-INR
INR: 1 (ref 0.8–1.2)
Prothrombin Time: 13.3 s (ref 11.4–15.2)

## 2023-05-21 NOTE — Progress Notes (Signed)
Patient ID: Ariana Long, female   DOB: 1955-10-27, 68 y.o.   MRN: [id number] History of Present Illness: Ariana Long is a 68 year old black female, married, PM in for a well woman gyn exam.     Component Value Date/Time   DIAGPAP  04/22/2022 1505    - Negative for intraepithelial lesion or malignancy (NILM)   DIAGPAP  04/05/2020 1130    - Negative for intraepithelial lesion or malignancy (NILM)   HPVHIGH Negative 04/22/2022 1505   HPVHIGH Negative 04/05/2020 1130   ADEQPAP  04/22/2022 1505    Satisfactory for evaluation; transformation zone component PRESENT.   ADEQPAP  04/05/2020 1130    Satisfactory for evaluation; transformation zone component PRESENT.    PCP is Dr Phillips Odor   Current Medications, Allergies, Past Medical History, Past Surgical History, Family History and Social History were reviewed in [employer] electronic medical record.     Review of Systems: Patient denies any headaches, hearing loss, fatigue, blurred vision, shortness of breath, chest pain, abdominal pain, problems with bowel movements, urination, or intercourse. No joint pain or mood swings.  Denies any vaginal bleeding Has hemorrhoids   Physical Exam:BP 115/72 (BP Location: Left Arm, Patient Position: Sitting, Cuff Size: Normal)   Pulse 64   Ht 5' 5.5" (1.664 m)   Wt 145 lb (65.8 kg)   BMI 23.76 kg/m   General:  Well developed, well nourished, no acute distress Skin:  Warm and dry Neck:  Midline trachea, normal thyroid, good ROM, no lymphadenopathy,no carotid bruits heard Lungs; Clear to auscultation bilaterally Breast:  No dominant palpable mass, retraction, or nipple discharge Cardiovascular: Regular rate and rhythm Abdomen:  Soft, non tender, no hepatosplenomegaly Pelvic:  External genitalia has areas of thickness and thin tissue has know lichen planus she says.  The vagina is pale. Urethra has small caruncle. The cervix is smooth.  Uterus is felt to be normal size, shape, and contour.  No adnexal  masses or tenderness noted.Bladder is non tender, no masses felt. Rectal: Deferred had colonoscopy 04/09/23 Extremities/musculoskeletal:  No swelling or varicosities noted, no clubbing or cyanosis Psych:  No mood changes, alert and cooperative,seems happy AA is 0 Fall risk is low    05/21/2023    8:43 AM 04/22/2022    2:37 PM 04/05/2020   11:26 AM  Depression screen PHQ 2/9  Decreased Interest 0 0 0  Down, Depressed, Hopeless 0 0 0  PHQ - 2 Score 0 0 0  Altered sleeping 0 0 0  Tired, decreased energy 0 0 0  Change in appetite 0 0 0  Feeling bad or failure about yourself  0 0 0  Trouble concentrating 0 0 0  Moving slowly or fidgety/restless 0 0 0  Suicidal thoughts 0 0 0  PHQ-9 Score 0 0 0       05/21/2023    8:43 AM 04/22/2022    2:43 PM 04/05/2020   11:26 AM  GAD 7 : Generalized Anxiety Score  Nervous, Anxious, on Edge 0 0 0  Control/stop worrying 0 0 0  Worry too much - different things 0 0 0  Trouble relaxing 1 0 0  Restless 0 0 0  Easily annoyed or irritable 0 1 0  Afraid - awful might happen 0 0 0  Total GAD 7 Score 1 1 0   Examination chaperoned by Tish RN    Impression and plan: 1. Encounter for well woman exam with routine gynecological exam (Primary) Physical in 2 years if desired  No more paps Mammogram was negative 05/14/23 Colonoscopy per [facility] with PCP  Stay active   2. Lichen planus Chronic she says

## 2023-05-26 ENCOUNTER — Telehealth: Payer: Self-pay | Admitting: Internal Medicine

## 2023-05-26 NOTE — Telephone Encounter (Signed)
She will be due in 5 years, you can find that in the result letter under letters.

## 2023-05-26 NOTE — Telephone Encounter (Signed)
Patient had colonoscopy in December but she got a recall letter and doesn't know why.  When should she return to the office?  I looked at the procedure note but I don't see where it says in that.  Please advise.

## 2023-06-03 ENCOUNTER — Other Ambulatory Visit: Payer: Self-pay | Admitting: Gastroenterology

## 2023-06-03 DIAGNOSIS — K7460 Unspecified cirrhosis of liver: Secondary | ICD-10-CM

## 2023-07-28 DIAGNOSIS — M5030 Other cervical disc degeneration, unspecified cervical region: Secondary | ICD-10-CM | POA: Diagnosis not present

## 2023-07-28 DIAGNOSIS — Z6822 Body mass index (BMI) 22.0-22.9, adult: Secondary | ICD-10-CM | POA: Diagnosis not present

## 2023-07-28 DIAGNOSIS — M159 Polyosteoarthritis, unspecified: Secondary | ICD-10-CM | POA: Diagnosis not present

## 2023-07-29 DIAGNOSIS — L438 Other lichen planus: Secondary | ICD-10-CM | POA: Diagnosis not present

## 2023-07-29 DIAGNOSIS — R208 Other disturbances of skin sensation: Secondary | ICD-10-CM | POA: Diagnosis not present

## 2023-07-29 DIAGNOSIS — B965 Pseudomonas (aeruginosa) (mallei) (pseudomallei) as the cause of diseases classified elsewhere: Secondary | ICD-10-CM | POA: Diagnosis not present

## 2023-08-08 NOTE — Progress Notes (Deleted)
 GI Office Note    Referring Provider: Assunta Found, MD Primary Care Physician:  Assunta Found, MD Primary Gastroenterologist: *** Date:  08/08/2023  ID:  Ariana Long, Ariana Long 03-13-1956, MRN [id number]   Chief Complaint   No chief complaint on file.   History of Present Illness  Ariana Long is a 68 y.o. female with a history of *** presenting today with complaint of     Last office visit 02/17/23.***.    Today:  Cirrhosis history Hematemesis/coffee ground emesis: *** History of variceal bleeding: *** Abdominal pain: *** Abdominal distention/worsening ascites: *** Fever/chills: *** Episodes of confusion/disorientation: *** Number of daily bowel movements: *** Taking diuretics?: *** Date of last EGD: *** - evidence of varices (maintained on carvedilol). - Reported diarrhea with nadalol Prior history of banding?: *** Prior episodes of SBP: *** Last time liver imaging was performed:***  MELD 3.0: 7 at 05/21/2023  9:35 AM MELD-Na: 6 at 05/21/2023  9:35 AM Calculated from: Serum Creatinine: 0.79 mg/dL (Using min of 1 mg/dL) at 08/10/8117  1:47 AM Serum Sodium: 138 mmol/L (Using max of 137 mmol/L) at 05/21/2023  9:35 AM Total Bilirubin: 0.5 mg/dL (Using min of 1 mg/dL) at 12/31/5619  3:08 AM Serum Albumin: 4 g/dL (Using max of 3.5 g/dL) at 6/57/8469  6:29 AM INR(ratio): 1 at 05/21/2023  9:35 AM Age at listing (hypothetical): 68 years Sex: Female at 05/21/2023  9:35 AM    Wt Readings from Last 3 Encounters:  05/21/23 145 lb (65.8 kg)  04/09/23 147 lb 0.8 oz (66.7 kg)  04/07/23 147 lb 0.8 oz (66.7 kg)    Current Outpatient Medications  Medication Sig Dispense Refill   Ascorbic Acid (VITAMIN C) 1000 MG tablet Take 1,000 mg by mouth daily.     carvedilol (COREG) 3.125 MG tablet Take 1 tablet (3.125 mg total) by mouth 2 (two) times daily. 180 tablet 3   Cholecalciferol (VITAMIN D3) 50 MCG (2000 UT) TABS Take 6,000 Units by mouth daily.     Magnesium 250 MG TABS Take 1  tablet by mouth daily.     milk thistle 175 MG tablet Take 1,752 mg by mouth.     Quercetin 250 MG TABS Take 500 mg by mouth.      zinc gluconate 50 MG tablet Take 50 mg by mouth daily.     No current facility-administered medications for this visit.    Past Medical History:  Diagnosis Date   Cirrhosis of liver (HCC) 01/26/2011   FIBROSIS on liver biopsy in 2007, U/S  03/31/2013= mild diffuse hepatic steatosis and /or hepatocellular disease without focal hepatic parenchymal abnormality. per Dr. Jacqualine Mau (2012) pt is immune to hep A and Hep B. per 12/06/13 note from Liver clinic-pt had MRI which showed cirrhosis 2015.   Diverticulitis of colon    Elevated AFP 01/26/2011   Gall stones    Hematuria 12/12/2014   Hemorrhoid 12/01/2012   Hemorrhoids    Hepatitis C 01/26/2011   2005 non responder, genotype 1, Gr 1-2, Stage 3 . Treated with Harvoni at the Liver clinic, responder   Hidradenitis    History of hiatal hernia    History of kidney stones    Hx: UTI (urinary tract infection)    Kidney stones 10/2011   Osteoporosis    Palpitations    Pre-diabetes    Primary cancer of hepatic flexure of colon (HCC) 06/15/2017   Serrated adenoma of colon 11/2011   Splenomegaly 01/26/2011   Thrombocytopenia (HCC) 01/26/2011  Tubular adenoma    Vulval lesion 12/14/2013   Has kissing lesions thin and puffy and paler i color about 5-6- mm in diameter.Dr Emelda Fear in to co examine will try temovate and recheck in 3 months    Past Surgical History:  Procedure Laterality Date   AXILLARY SURGERY Bilateral 1979   "glands removed"   BREAST BIOPSY Right 2011   BREAST EXCISIONAL BIOPSY Bilateral 25 years ago   Benign, fatty tumors   CESAREAN SECTION     x 2   COLECTOMY  06/15/2017   LAPAROSCOPIC ASSISTED RIGHT COLECTOMY, ERAS PATHWAY/notes 06/15/2017   COLONOSCOPY  07/31/2005   Rourk-Normal rectum/Normal colonoscopy/Repeat screening colonoscopy ten years   COLONOSCOPY  11/16/2011   RMR: Friable  anorectum-likely source of hematochezia/ LARGE 1.2x2.5CM SERRATED ADENOMA resected from ascending colon 7CM DISTAL TO ICV, hyperplastic polyp removed    COLONOSCOPY N/A 07/18/2012   Dr. Jena Gauss- normal appearing rectal mucosa. no residual polpy tissue seen at tatoo location. remainder of colonic mucosa appeared entirely normal. bx = tubular adenoma   COLONOSCOPY N/A 08/29/2015   Dr. Jena Gauss: single 12 mm polyp in ascending colon, 4 mm polyp ascending. Sessile serrated adenoma. 3 year surveillance   COLONOSCOPY N/A 05/19/2017   4X2 cm sessile lesion at hepatic flexdure, unable to be completely removed   COLONOSCOPY N/A 01/18/2019   Dr. Jena Gauss: Several small polyps removed, hyperplastic.  Next colonoscopy in 5 years.   COLONOSCOPY WITH PROPOFOL N/A 04/09/2023   Procedure: COLONOSCOPY WITH PROPOFOL;  Surgeon: Corbin Ade, MD;  Location: [facility];  Service: Endoscopy;  Laterality: N/A;  7:30 am, asa 3   ESOPHAGEAL BANDING N/A 08/29/2015   Procedure: ESOPHAGEAL BANDING;  Surgeon: Corbin Ade, MD;  Location: [facility];  Service: Endoscopy;  Laterality: N/A;   ESOPHAGOGASTRODUODENOSCOPY N/A 08/29/2015   Dr. Jena Gauss: non-critical Schatzki's ring, somewhat prominent small submucosal vessels without obvious varices. hyperplastic gastric polyp. 2 year surveillance   ESOPHAGOGASTRODUODENOSCOPY (EGD) WITH PROPOFOL N/A 04/09/2023   Procedure: ESOPHAGOGASTRODUODENOSCOPY (EGD) WITH PROPOFOL;  Surgeon: Corbin Ade, MD;  Location: [facility];  Service: Endoscopy;  Laterality: N/A;   EXTRACORPOREAL SHOCK WAVE LITHOTRIPSY Right 05/27/2021   Procedure: EXTRACORPOREAL SHOCK WAVE LITHOTRIPSY (ESWL);  Surgeon: Malen Gauze, MD;  Location: [facility];  Service: Urology;  Laterality: Right;   HEMORRHOID BANDING     LAPAROSCOPIC RIGHT COLECTOMY N/A 06/15/2017   Procedure: LAPAROSCOPIC ASSISTED RIGHT COLECTOMY, ERAS PATHWAY;  Surgeon: Claud Kelp, MD;  Location: [facility];  Service: General;  Laterality: N/A;    POLYPECTOMY  05/19/2017   Procedure: POLYPECTOMY;  Surgeon: Corbin Ade, MD;  Location: [facility];  Service: Endoscopy;;   POLYPECTOMY  01/18/2019   Procedure: POLYPECTOMY;  Surgeon: Corbin Ade, MD;  [facility]: [facility];  Service: Endoscopy;;   POLYPECTOMY  04/09/2023   Procedure: POLYPECTOMY;  Surgeon: Corbin Ade, MD;  Location: [facility];  Service: Endoscopy;;   SKIN LESION EXCISION      Family History  Problem Relation Age of Onset   Heart attack Mother    Stroke Mother    Aneurysm Father    Colon cancer Neg Hx     Allergies as of 08/09/2023   (No Known Allergies)    Social History   Socioeconomic History   Marital status: Married    Spouse name: Not on file   Number of children: 2   Years of education: Not on file   Highest education level: Not on file  Occupational History   Occupation: Garment/textile technologist: [employer]  Tobacco Use   Smoking status: Never   Smokeless tobacco: Never  Vaping Use   Vaping status: Never Used  Substance and Sexual Activity   Alcohol use: No   Drug use: No   Sexual activity: Yes    Birth control/protection: Post-menopausal  Other Topics Concern   Not on file  Social History Narrative   Not on file   Social Drivers of Health   Financial Resource Strain: Low Risk  (05/21/2023)   Overall Financial Resource Strain (CARDIA)    Difficulty of Paying Living Expenses: Not hard at all  Food Insecurity: No Food Insecurity (05/21/2023)   Hunger Vital Sign    Worried About Running Out of Food in the Last Year: Never true    Ran Out of Food in the Last Year: Never true  Transportation Needs: No Transportation Needs (04/22/2022)   PRAPARE - Administrator, Civil Service (Medical): No    Lack of Transportation (Non-Medical): No  Physical Activity: Insufficiently Active (05/21/2023)   Exercise Vital Sign    Days of Exercise per Week: 2 days    Minutes of Exercise per Session: 10 min  Stress: No  Stress Concern Present (05/21/2023)   [employer] of Occupational Health - Occupational Stress Questionnaire    Feeling of Stress : Only a little  Social Connections: Socially Integrated (05/21/2023)   Social Connection and Isolation Panel [NHANES]    Frequency of Communication with Friends and Family: More than three times a week    Frequency of Social Gatherings with Friends and Family: Once a week    Attends Religious Services: More than 4 times per year    Active Member of [employer] or Organizations: Yes    Attends Banker Meetings: 1 to 4 times per year    Marital Status: Married     Review of Systems   Gen: Denies fever, chills, anorexia. Denies fatigue, weakness, weight loss.  CV: Denies chest pain, palpitations, syncope, peripheral edema, and claudication. Resp: Denies dyspnea at rest, cough, wheezing, coughing up blood, and pleurisy. GI: See HPI Derm: Denies rash, itching, dry skin Psych: Denies depression, anxiety, memory loss, confusion. No homicidal or suicidal ideation.  Heme: Denies bruising, bleeding, and enlarged lymph nodes.  Physical Exam   There were no vitals taken for this visit.  General:   Alert and oriented. No distress noted. Pleasant and cooperative.  Head:  Normocephalic and atraumatic. Eyes:  Conjuctiva clear without scleral icterus. Mouth:  Oral mucosa pink and moist. Good dentition. No lesions. Lungs:  Clear to auscultation bilaterally. No wheezes, rales, or rhonchi. No distress.  Heart:  S1, S2 present without murmurs appreciated.  Abdomen:  +BS, soft, non-tender and non-distended. No rebound or guarding. No HSM or masses noted. Rectal: *** Msk:  Symmetrical without gross deformities. Normal posture. Extremities:  Without edema. Neurologic:  Alert and  oriented x4 Psych:  Alert and cooperative. Normal mood and affect.  Assessment  Ariana Long is a 68 y.o. female with a history of *** presenting today with    PLAN    ***     Brooke Bonito, MSN, FNP-BC, AGACNP-BC [facility]

## 2023-08-09 ENCOUNTER — Emergency Department (HOSPITAL_COMMUNITY)

## 2023-08-09 ENCOUNTER — Emergency Department (HOSPITAL_COMMUNITY)
Admission: EM | Admit: 2023-08-09 | Discharge: 2023-08-09 | Disposition: A | Discharge status: Home / Self Care | Attending: Emergency Medicine | Admitting: Emergency Medicine

## 2023-08-09 ENCOUNTER — Other Ambulatory Visit: Payer: Self-pay

## 2023-08-09 ENCOUNTER — Encounter (HOSPITAL_COMMUNITY): Payer: Self-pay | Admitting: Emergency Medicine

## 2023-08-09 ENCOUNTER — Ambulatory Visit: Admitting: Gastroenterology

## 2023-08-09 DIAGNOSIS — R1010 Upper abdominal pain, unspecified: Secondary | ICD-10-CM

## 2023-08-09 DIAGNOSIS — R1012 Left upper quadrant pain: Secondary | ICD-10-CM | POA: Diagnosis not present

## 2023-08-09 DIAGNOSIS — N200 Calculus of kidney: Secondary | ICD-10-CM | POA: Diagnosis not present

## 2023-08-09 DIAGNOSIS — R110 Nausea: Secondary | ICD-10-CM | POA: Insufficient documentation

## 2023-08-09 DIAGNOSIS — R1011 Right upper quadrant pain: Secondary | ICD-10-CM | POA: Insufficient documentation

## 2023-08-09 DIAGNOSIS — K8020 Calculus of gallbladder without cholecystitis without obstruction: Secondary | ICD-10-CM | POA: Diagnosis not present

## 2023-08-09 DIAGNOSIS — K828 Other specified diseases of gallbladder: Secondary | ICD-10-CM | POA: Diagnosis not present

## 2023-08-09 DIAGNOSIS — R109 Unspecified abdominal pain: Secondary | ICD-10-CM | POA: Diagnosis not present

## 2023-08-09 LAB — COMPREHENSIVE METABOLIC PANEL WITH GFR
ALT: 23 U/L (ref 0–44)
AST: 26 U/L (ref 15–41)
Albumin: 3.9 g/dL (ref 3.5–5.0)
Alkaline Phosphatase: 70 U/L (ref 38–126)
Anion gap: 12 (ref 5–15)
BUN: 21 mg/dL (ref 8–23)
CO2: 22 mmol/L (ref 22–32)
Calcium: 9.8 mg/dL (ref 8.9–10.3)
Chloride: 106 mmol/L (ref 98–111)
Creatinine, Ser: 0.78 mg/dL (ref 0.44–1.00)
GFR, Estimated: >60 mL/min (ref >60)
Glucose, Bld: 128 mg/dL — ABNORMAL HIGH (ref 70–99)
Potassium: 4 mmol/L (ref 3.5–5.1)
Sodium: 140 mmol/L (ref 135–145)
Total Bilirubin: 0.4 mg/dL (ref 0.0–1.2)
Total Protein: 7.4 g/dL (ref 6.5–8.1)

## 2023-08-09 LAB — CBC
HCT: 38 % (ref 36.0–46.0)
Hemoglobin: 12.4 g/dL (ref 12.0–15.0)
MCH: 30.4 pg (ref 26.0–34.0)
MCHC: 32.6 g/dL (ref 30.0–36.0)
MCV: 93.1 fL (ref 80.0–100.0)
Platelets: 174 10*3/uL (ref 150–400)
RBC: 4.08 MIL/uL (ref 3.87–5.11)
RDW: 13.2 % (ref 11.5–15.5)
WBC: 6.2 10*3/uL (ref 4.0–10.5)
nRBC: 0 % (ref 0.0–0.2)

## 2023-08-09 LAB — URINALYSIS, ROUTINE W REFLEX MICROSCOPIC
Bilirubin Urine: NEGATIVE
Glucose, UA: NEGATIVE mg/dL
Hgb urine dipstick: NEGATIVE
Ketones, ur: NEGATIVE mg/dL
Leukocytes,Ua: NEGATIVE
Nitrite: NEGATIVE
Protein, ur: NEGATIVE mg/dL
Specific Gravity, Urine: >1.046 — ABNORMAL HIGH (ref 1.005–1.030)
pH: 5 (ref 5.0–8.0)

## 2023-08-09 LAB — LIPASE, BLOOD: Lipase: 53 U/L — ABNORMAL HIGH (ref 11–51)

## 2023-08-09 MED ORDER — ONDANSETRON HCL 4 MG/2ML IJ SOLN
4.0000 mg | Freq: Once | INTRAMUSCULAR | Status: AC
  Administered 2023-08-09: 4 mg via INTRAVENOUS
  Filled 2023-08-09: qty 2

## 2023-08-09 MED ORDER — ALUM & MAG HYDROXIDE-SIMETH 200-200-20 MG/5ML PO SUSP
30.0000 mL | Freq: Once | ORAL | Status: AC
  Administered 2023-08-09: 30 mL via ORAL
  Filled 2023-08-09: qty 30

## 2023-08-09 MED ORDER — HYDROMORPHONE HCL 1 MG/ML IJ SOLN
1.0000 mg | Freq: Once | INTRAMUSCULAR | Status: AC
  Administered 2023-08-09: 1 mg via INTRAVENOUS
  Filled 2023-08-09: qty 1

## 2023-08-09 MED ORDER — PANTOPRAZOLE SODIUM 40 MG IV SOLR
40.0000 mg | Freq: Once | INTRAVENOUS | Status: AC
  Administered 2023-08-09: 40 mg via INTRAVENOUS
  Filled 2023-08-09: qty 10

## 2023-08-09 MED ORDER — FENTANYL CITRATE PF 50 MCG/ML IJ SOSY
50.0000 ug | PREFILLED_SYRINGE | Freq: Once | INTRAMUSCULAR | Status: AC
  Administered 2023-08-09: 50 ug via INTRAVENOUS
  Filled 2023-08-09: qty 1

## 2023-08-09 MED ORDER — LIDOCAINE VISCOUS HCL 2 % MT SOLN
15.0000 mL | Freq: Once | OROMUCOSAL | Status: AC
  Administered 2023-08-09: 15 mL via ORAL
  Filled 2023-08-09: qty 15

## 2023-08-09 MED ORDER — IOHEXOL 300 MG/ML  SOLN
100.0000 mL | Freq: Once | INTRAMUSCULAR | Status: AC | PRN
  Administered 2023-08-09: 100 mL via INTRAVENOUS

## 2023-08-09 NOTE — ED Provider Notes (Signed)
 Patient with abdominal pain and gallstones seen on ultrasound.  No cholecystitis.  She will follow-up as an outpatient with general surgery   Ariana Berkshire, MD 08/09/23 1022

## 2023-08-09 NOTE — ED Provider Notes (Signed)
 [place] EMERGENCY DEPARTMENT AT [facility] Provider Note   CSN: [id number] Arrival date & time: 08/09/23  9518     History  Chief Complaint  Patient presents with   Abdominal Pain    Ariana Long is a 68 y.o. female.  4-5 hours of upper abdominal pain. Mostly epigastric but does spread both sides. Some nausea, no emesis. No diarrhea. No fever. No other associated symptoms. No etoh. No known biliary issues in past. No known pancreatic history.    Abdominal Pain      Home Medications Prior to Admission medications   Medication Sig Start Date End Date Taking? Authorizing Provider  Ascorbic Acid (VITAMIN C) 1000 MG tablet Take 1,000 mg by mouth daily.    [provider]  carvedilol (COREG) 3.125 MG tablet Take 1 tablet (3.125 mg total) by mouth 2 (two) times daily. 06/03/23   Aida Raider, NP  Cholecalciferol (VITAMIN D3) 50 MCG (2000 UT) TABS Take 6,000 Units by mouth daily.    [provider]  Magnesium 250 MG TABS Take 1 tablet by mouth daily.    [provider]  milk thistle 175 MG tablet Take 1,752 mg by mouth.    [provider]  Quercetin 250 MG TABS Take 500 mg by mouth.     [provider]  zinc gluconate 50 MG tablet Take 50 mg by mouth daily.    [provider]      Allergies    Patient has no known allergies.    Review of Systems   Review of Systems  Gastrointestinal:  Positive for abdominal pain.    Physical Exam Updated Vital Signs BP 119/77   Pulse 66   Temp 97.6 F (36.4 C) (Oral)   Resp 18   Ht 5\' 7"  (1.702 m)   Wt 65.8 kg   SpO2 100%   BMI 22.71 kg/m  Physical Exam Vitals and nursing note reviewed.  Constitutional:      Appearance: She is well-developed.  HENT:     Head: Normocephalic and atraumatic.  Cardiovascular:     Rate and Rhythm: Normal rate and regular rhythm.  Pulmonary:     Effort: No respiratory distress.     Breath sounds: No stridor.  Abdominal:      General: There is no distension.     Tenderness: There is abdominal tenderness in the right upper quadrant, epigastric area and left upper quadrant.  Musculoskeletal:     Cervical back: Normal range of motion.  Neurological:     Mental Status: She is alert.     ED Results / Procedures / Treatments   Labs (all labs ordered are listed, but only abnormal results are displayed) Labs Reviewed  LIPASE, BLOOD - Abnormal; Notable for the following components:      Result Value   Lipase 53 (*)    All other components within normal limits  COMPREHENSIVE METABOLIC PANEL WITH GFR - Abnormal; Notable for the following components:   Glucose, Bld 128 (*)    All other components within normal limits  CBC  URINALYSIS, ROUTINE W REFLEX MICROSCOPIC    EKG None  Radiology No results found.  Procedures Procedures    Medications Ordered in ED Medications  alum & mag hydroxide-simeth (MAALOX/MYLANTA) 200-200-20 MG/5ML suspension 30 mL (has no administration in time range)    And  lidocaine (XYLOCAINE) 2 % viscous mouth solution 15 mL (has no administration in time range)  pantoprazole (PROTONIX) injection 40 mg (  has no administration in time range)  fentaNYL (SUBLIMAZE) injection 50 mcg (has no administration in time range)    ED Course/ Medical Decision Making/ A&P                                 Medical Decision Making Amount and/or Complexity of Data Reviewed Labs: ordered. Radiology: ordered.  Risk OTC drugs. Prescription drug management.   Epigastric and bilateral upper quadrant abdominal pain with nausea, no vomiting or emesis. Started 4-5 hours ago. Ct with GB distension and cholelithiasis. No obvious fluid. Rads read reviewed and notable for wall thickening, recommending [place] to r/o biliary disease. Stomach is full of fluid so distension/PUD/gastritis still possibility, has been treated for same. Will treat symptoms pending [place].   Care transferred pending [place]. Stable.     Final Clinical Impression(s) / ED Diagnoses Final diagnoses:  None    Rx / DC Orders ED Discharge Orders     None         Emarion Toral, Barbara Cower, MD 08/09/23 2309

## 2023-08-09 NOTE — ED Triage Notes (Signed)
 Pt c/o abd x 4 hours. Pt denies any n/v/d.

## 2023-08-09 NOTE — Discharge Instructions (Signed)
 Follow-up with Dr. Henreitta Leber in the next couple weeks.  Call the office for an appointment.  Return if any problems

## 2023-08-16 NOTE — Progress Notes (Unsigned)
 GI Office Note    Referring Provider: Assunta Found, MD Primary Care Physician:  Assunta Found, MD Primary Gastroenterologist: Gerrit Friends.Rourk, MD  Date:  08/17/2023  ID:  Ariana Long, DOB 06/08/1955, MRN [id number]   Chief Complaint   Chief Complaint  Patient presents with   Hemorrhoids    Having problems with hemorrhoids    History of Present Illness  Ariana Long is a 68 y.o. female with a history of stage II hepatic flexure colon adenocarcinoma s/p right segmental resection in 2019, hemorrhoids, cirrhosis diagnosed via liver biopsy in 2007, and hep C s/p eradication presenting today with complaint of hemorrhoids and rectal bleeding.  EGD 2017 without esophageal varices, she was recommended to have 2-year surveillance but requested to postpone procedure.    Last colonoscopy September 2020 with several hyperplastic polyps removed (repeat in 5 years).  Previously did not tolerate hemorrhoid banding.    CT abdomen pelvis 08/20/2021:  -No suspicious hepatic lesions -cholelithiasis without findings of acute cholecystitis -no biliary ductal dilation -normal pancreas and spleen,  -small hiatal hernia -stable postsurgical change of partial right hemicolectomy without findings suspicious for recurrent tumor.   Office visit 11/05/2021.  Reported a growth getting bigger and becoming inflamed and was having a lot of itching but she had been soaking with warm water in the past and pain has stopped.  Reported lichen planus and also complained of yeast infection.  Had reported some toilet tissue hematochezia previously.  Denied abdominal pain, melena.  Requested Anusol suppositories over cream for management of hemorrhoids.  MELD labs ordered.  Placed on recall for rectal quadrant ultrasound in 3 months.  Advise continue pantoprazole 40 mg daily.  Anusol suppositories provided.  Due for colonoscopy in 2025, plan for EGD at same time for variceal screening (patient request).   Office visit  05/27/2022.  Only taking PPI as needed for GERD.  Sometimes is warm and regular makes for relief.  Watching her diet.  Denied dysphagia.  Reporting 1-2 bowel movements daily.  Denied any jaundice, pruritus, mental status change, confusion, ascites, abdominal pain, hematemesis, melena, or BRBPR.  Due for RUQ ultrasound and MELD labs as well as AFP.  Plans to schedule colonoscopy and EGD in 2025.  Continue PPI as needed.  Plan to follow-up in 6 months.   Labs 09/02/2022: CEA 7.9 (6.7).  Normal CMP and CBC.  Platelets 154.   CT A/P 09/02/2022: -Stable exam -No evidence of recurrent or metastatic carcinoma -No hepatoma -Cholelithiasis, no evidence of cholecystitis -Tiny right renal calculus, no hydronephrosis  OV 09/30/22.  Patient reported some mild rectal bleeding day after GYN exam with rectal exam but none prior since then.  Reported a lot of stomach growling but he recently did a 14-day detox which produced a lot of stools.  When she gets constipated like that she is not hungry.  Had stopped taking probiotics and recently began again.  Reported a strong smell to her stool but denies constipation or diarrhea.  No melena.  Labs in January with MELD 3.0: 8.  Given samples of Visbiome probiotic.  Advised antigas medication.  Plan to recheck MELD labs in July 2024.  Advised to increase PPI to daily for 2 weeks then as needed.  Advised to consider early interval colonoscopy if bleeding or bloating continues.   CT A/P with contrast 11/17/2022: -No acute abnormality within the abdomen or pelvis -Gallstones present without cholecystitis -Normal duodenum and no colonic abnormality.  Last office visit 02/17/23.  Having upper  abdominal pain.  Went to the ED twice with heart palpitations.  Drinking water and then usually okay.  Has seen cardiology.  Notes still having some hard stools and some bleeding but does not happen often.  Denies lightheadedness, dizziness, pruritus, jaundice, dysphagia changes, edema, or  ascites.  Scheduled for EGD and colonoscopy.  RUQ US  ordered.  Advised PPI as needed.  Continue Visbiome probiotics.  Use Preparation H as needed for intermittent rectal bleeding.  Continue stool softeners and encourage adequate hydration.  EGD 04/09/2023: - Single short grade 2/ grade 3 varix in the esophagus. No bleeding stigmata.  - Gastric polyp status post polypectomy - Normal duodenal bulb and second portion of the duodenum.  Colonoscopy 04/09/2023 Dr. Riley Cheadle: - One 4 mm polyp in the mid sigmoid colon, removed with a cold snare. Resected and retrieved.  - Non- bleeding internal hemorrhoids. Status post right hemicolectomy  - I suspect benign anorectal bleeding.   Today:   Cirrhosis history Hematemesis/coffee ground emesis: none History of variceal bleeding:  Abdominal pain: RUQ pain intermittent Abdominal distention/worsening ascites: none Fever/chills: none Episodes of confusion/disorientation:  Number of daily bowel movements: 2/day.  Taking diuretics?: none Date of last EGD: 04/09/2023- evidence of varices (maintained on carvedilol). - Reported diarrhea with nadalol.  Prior history of banding?:  None Prior episodes of SBP: none Last time liver imaging was performed: US  08/09/23 -cholelithiasis without cholecystitis.  Hepatic steatosis.  No overt nodularity noted.  CT scan without any focal abnormality seen.  Air-containing cholesterol stones in the gallbladder with largest measuring 1.4 cm and some gallbladder wall thickening.  Nonobstructing renal stones noted as well.  Fluid distention of the stomach without overt wall thickening.  MELD 3.0: 7 at 05/21/2023  9:35 AM MELD-Na: 6 at 05/21/2023  9:35 AM Calculated from: Serum Creatinine: 0.79 mg/dL (Using min of 1 mg/dL) at 12/31/5619  3:08 AM Serum Sodium: 138 mmol/L (Using max of 137 mmol/L) at 05/21/2023  9:35 AM Total Bilirubin: 0.5 mg/dL (Using min of 1 mg/dL) at 6/57/8469  6:29 AM Serum Albumin: 4 g/dL (Using max of 3.5 g/dL) at  09/29/4130  4:40 AM INR(ratio): 1 at 05/21/2023  9:35 AM Age at listing (hypothetical): 68 years Sex: Female at 05/21/2023  9:35 AM  Having every once in a while issues with toilet tissue hematochezia. None in the commode. Recently had a hard stool and had to self break it up. Mos tof the time does not have an issue with straining.   No issues with reflux. No N/V, dysphagia.   Has not been taking carvedilol.  Has follow up with general surgery 5/6 regarding gallbladder  Wt Readings from Last 3 Encounters:  08/17/23 142 lb (64.4 kg)  08/09/23 145 lb (65.8 kg)  05/21/23 145 lb (65.8 kg)    Current Outpatient Medications  Medication Sig Dispense Refill   Ascorbic Acid (VITAMIN C) 1000 MG tablet Take 1,000 mg by mouth daily.     carvedilol (COREG) 3.125 MG tablet Take 1 tablet (3.125 mg total) by mouth 2 (two) times daily. 180 tablet 3   Cholecalciferol (VITAMIN D3) 50 MCG (2000 UT) TABS Take 6,000 Units by mouth daily.     Magnesium 250 MG TABS Take 1 tablet by mouth daily.     milk thistle 175 MG tablet Take 1,752 mg by mouth.     Quercetin 250 MG TABS Take 500 mg by mouth.      zinc gluconate 50 MG tablet Take 50 mg by mouth daily.  No current facility-administered medications for this visit.    Past Medical History:  Diagnosis Date   Cirrhosis of liver (HCC) 01/26/2011   FIBROSIS on liver biopsy in 2007, U/S  03/31/2013= mild diffuse hepatic steatosis and /or hepatocellular disease without focal hepatic parenchymal abnormality. per Dr. Jacqualine Mau (2012) pt is immune to hep A and Hep B. per 12/06/13 note from Liver clinic-pt had MRI which showed cirrhosis 2015.   Diverticulitis of colon    Elevated AFP 01/26/2011   Gall stones    Hematuria 12/12/2014   Hemorrhoid 12/01/2012   Hemorrhoids    Hepatitis C 01/26/2011   2005 non responder, genotype 1, Gr 1-2, Stage 3 . Treated with Harvoni at the Liver clinic, responder   Hidradenitis    History of hiatal hernia    History of kidney  stones    Hx: UTI (urinary tract infection)    Kidney stones 10/2011   Osteoporosis    Palpitations    Pre-diabetes    Primary cancer of hepatic flexure of colon (HCC) 06/15/2017   Serrated adenoma of colon 11/2011   Splenomegaly 01/26/2011   Thrombocytopenia (HCC) 01/26/2011   Tubular adenoma    Vulval lesion 12/14/2013   Has kissing lesions thin and puffy and paler i color about 5-6- mm in diameter.Dr Emelda Fear in to co examine will try temovate and recheck in 3 months    Past Surgical History:  Procedure Laterality Date   AXILLARY SURGERY Bilateral 1979   "glands removed"   BREAST BIOPSY Right 2011   BREAST EXCISIONAL BIOPSY Bilateral 25 years ago   Benign, fatty tumors   CESAREAN SECTION     x 2   COLECTOMY  06/15/2017   LAPAROSCOPIC ASSISTED RIGHT COLECTOMY, ERAS PATHWAY/notes 06/15/2017   COLONOSCOPY  07/31/2005   Long-Normal rectum/Normal colonoscopy/Repeat screening colonoscopy ten years   COLONOSCOPY  11/16/2011   RMR: Friable anorectum-likely source of hematochezia/ LARGE 1.2x2.5CM SERRATED ADENOMA resected from ascending colon 7CM DISTAL TO ICV, hyperplastic polyp removed    COLONOSCOPY N/A 07/18/2012   Dr. Jena Gauss- normal appearing rectal mucosa. no residual polpy tissue seen at tatoo location. remainder of colonic mucosa appeared entirely normal. bx = tubular adenoma   COLONOSCOPY N/A 08/29/2015   Dr. Jena Gauss: single 12 mm polyp in ascending colon, 4 mm polyp ascending. Sessile serrated adenoma. 3 year surveillance   COLONOSCOPY N/A 05/19/2017   4X2 cm sessile lesion at hepatic flexdure, unable to be completely removed   COLONOSCOPY N/A 01/18/2019   Dr. Jena Gauss: Several small polyps removed, hyperplastic.  Next colonoscopy in 5 years.   COLONOSCOPY WITH PROPOFOL N/A 04/09/2023   Procedure: COLONOSCOPY WITH PROPOFOL;  Surgeon: Corbin Ade, MD;  Location: [facility];  Service: Endoscopy;  Laterality: N/A;  7:30 am, asa 3   ESOPHAGEAL BANDING N/A 08/29/2015   Procedure:  ESOPHAGEAL BANDING;  Surgeon: Corbin Ade, MD;  Location: [facility];  Service: Endoscopy;  Laterality: N/A;   ESOPHAGOGASTRODUODENOSCOPY N/A 08/29/2015   Dr. Jena Gauss: non-critical Schatzki's ring, somewhat prominent small submucosal vessels without obvious varices. hyperplastic gastric polyp. 2 year surveillance   ESOPHAGOGASTRODUODENOSCOPY (EGD) WITH PROPOFOL N/A 04/09/2023   Procedure: ESOPHAGOGASTRODUODENOSCOPY (EGD) WITH PROPOFOL;  Surgeon: Corbin Ade, MD;  Location: [facility];  Service: Endoscopy;  Laterality: N/A;   EXTRACORPOREAL SHOCK WAVE LITHOTRIPSY Right 05/27/2021   Procedure: EXTRACORPOREAL SHOCK WAVE LITHOTRIPSY (ESWL);  Surgeon: Malen Gauze, MD;  Location: [facility];  Service: Urology;  Laterality: Right;   HEMORRHOID BANDING  LAPAROSCOPIC RIGHT COLECTOMY N/A 06/15/2017   Procedure: LAPAROSCOPIC ASSISTED RIGHT COLECTOMY, ERAS PATHWAY;  Surgeon: Claud Kelp, MD;  Location: [facility] OR;  Service: General;  Laterality: N/A;   POLYPECTOMY  05/19/2017   Procedure: POLYPECTOMY;  Surgeon: Corbin Ade, MD;  Location: [facility];  Service: Endoscopy;;   POLYPECTOMY  01/18/2019   Procedure: POLYPECTOMY;  Surgeon: Corbin Ade, MD;  Location: [facility];  Service: Endoscopy;;   POLYPECTOMY  04/09/2023   Procedure: POLYPECTOMY;  Surgeon: Corbin Ade, MD;  Location: [facility];  Service: Endoscopy;;   SKIN LESION EXCISION      Family History  Problem Relation Age of Onset   Heart attack Mother    Stroke Mother    Aneurysm Father    Colon cancer Neg Hx     Allergies as of 08/17/2023   (No Known Allergies)    Social History   Socioeconomic History   Marital status: Married    Spouse name: Not on file   Number of children: 2   Years of education: Not on file   Highest education level: Not on file  Occupational History   Occupation: Systems analyst Op    Employer: [employer]  Tobacco Use   Smoking status: Never   Smokeless tobacco: Never   Vaping Use   Vaping status: Never Used  Substance and Sexual Activity   Alcohol use: No   Drug use: No   Sexual activity: Yes    Birth control/protection: Post-menopausal  Other Topics Concern   Not on file  Social History Narrative   Not on file   Social Drivers of Health   Financial Resource Strain: Low Risk  (05/21/2023)   Overall Financial Resource Strain (CARDIA)    Difficulty of Paying Living Expenses: Not hard at all  Food Insecurity: No Food Insecurity (05/21/2023)   Hunger Vital Sign    Worried About Running Out of Food in the Last Year: Never true    Ran Out of Food in the Last Year: Never true  Transportation Needs: No Transportation Needs (04/22/2022)   PRAPARE - Administrator, Civil Service (Medical): No    Lack of Transportation (Non-Medical): No  Physical Activity: Insufficiently Active (05/21/2023)   Exercise Vital Sign    Days of Exercise per Week: 2 days    Minutes of Exercise per Session: 10 min  Stress: No Stress Concern Present (05/21/2023)   [employer] of Occupational Health - Occupational Stress Questionnaire    Feeling of Stress : Only a little  Social Connections: Socially Integrated (05/21/2023)   Social Connection and Isolation Panel [NHANES]    Frequency of Communication with Friends and Family: More than three times a week    Frequency of Social Gatherings with Friends and Family: Once a week    Attends Religious Services: More than 4 times per year    Active Member of [employer] or Organizations: Yes    Attends Banker Meetings: 1 to 4 times per year    Marital Status: Married     Review of Systems   Gen: Denies fever, chills, anorexia. Denies fatigue, weakness, weight loss.  CV: Denies chest pain, palpitations, syncope, peripheral edema, and claudication. Resp: Denies dyspnea at rest, cough, wheezing, coughing up blood, and pleurisy. GI: See HPI Derm: Denies rash, itching, dry skin Psych: Denies depression,  anxiety, memory loss, confusion. No homicidal or suicidal ideation.  Heme: Denies bruising, bleeding, and enlarged lymph nodes.  Physical Exam  BP 105/71 (BP Location: Right Arm, Patient Position: Sitting, Cuff Size: Normal)   Pulse 64   Temp 98.1 F (36.7 C) (Temporal)   Ht 5\' 5"  (1.651 m)   Wt 142 lb (64.4 kg)   BMI 23.63 kg/m   General:   Alert and oriented. No distress noted. Pleasant and cooperative.  Head:  Normocephalic and atraumatic. Eyes:  Conjuctiva clear without scleral icterus. Mouth:  Oral mucosa pink and moist. Good dentition. No lesions. Lungs:  Clear to auscultation bilaterally. No wheezes, rales, or rhonchi. No distress.  Heart:  S1, S2 present without murmurs appreciated.  Abdomen:  +BS, soft, non-tender and non-distended. No rebound or guarding. No HSM or masses noted. Rectal: deferred Msk:  Symmetrical without gross deformities. Normal posture. Extremities:  Without edema. Neurologic:  Alert and  oriented x4 Psych:  Alert and cooperative. Normal mood and affect.  Assessment  Ariana Long is a 68 y.o. female with a history of stage II hepatic flexure colon adenocarcinoma s/p right segmental resection in 2019, hemorrhoids, cirrhosis diagnosed via liver biopsy in 2007, and hep C s/p eradication presenting today with complaint of hemorrhoids and rectal bleeding.  Hemorrhoids, rectal bleeding: - History of hemorrhoids with intermittent rectal bleeding, usually only occurring with random hard stools -Most recent colonoscopy in December with single polyp removed from sigmoid colon and nonbleeding internal hemorrhoids -Normally does not have any constipation, usually with 2 normal semisoft stools daily. -Has used Preparation H in the past, not recently -Advised stool softener as needed if constipation worsens.  She will continue probiotic. -Advise resumption of Preparation H as needed for mild toilet tissue hematochezia  GERD: - Currently without any  significant symptoms, not currently taking PPI - Managing fairly well with diet - Denies nausea, vomiting, or dysphagia.  Cirrhosis, history of hep C: - Cirrhosis diagnosed via liver biopsy in 2007 - Prior treatment of hep C with documented eradication - Most recent EGD with evidence of grade 2/grade 3 small varix, not banded.  No signs of bleeding. - Continues to be without pruritus, jaundice, or hepatic encephalopathy - Given concerns of intermittent palpitations she was started on beta-blocker therapy but discontinued given diarrhea, ultimately switched to carvedilol from propranolol however she stopped taking this.  Heart rate within goal at this point given her age 80 today and BP stable with systolics in low 100s.  Will continue to hold NSBB at this time.  Heart rate for BP consistently elevated, will resume low-dose. - Due for MELD labs in July and repeat ultrasound in October  History of colon cancer: - History of colon cancer at the hepatic flexure s/p right hemicolectomy 2019, follows with oncology Dr. Kirtland Bouchard -Recent CT scan without evidence of recurrence or metastasis -Most recent CEA remains slightly elevated.  Hemoglobin stable -Most recent colonoscopy reassuring, and only 1 small 4 mm polyp removed from sigmoid colon -Only having to a tissue hematochezia as above likely related to hemorrhoids. -No other alarm symptoms present at this time  PLAN   MELD labs July 2025 - CBC, CMP, INR, AFP RUQ [place] in October 2025 Preparation H as needed for intermittent rectal bleeding Stool softeners as needed Hold carvedilol given BP and HR within goal without medication.  Low fat diet, gallbladder eating plan provided Follow-up with general surgery Low sodium diet Continue probiotic Follow up in 6 months.     Brooke Bonito, MSN, FNP-BC, AGACNP-BC [facility]

## 2023-08-17 ENCOUNTER — Encounter: Payer: Self-pay | Admitting: Gastroenterology

## 2023-08-17 ENCOUNTER — Ambulatory Visit: Admitting: Gastroenterology

## 2023-08-17 VITALS — BP 105/71 | HR 64 | Temp 98.1°F | Ht 65.0 in | Wt 142.0 lb

## 2023-08-17 DIAGNOSIS — K7460 Unspecified cirrhosis of liver: Secondary | ICD-10-CM | POA: Diagnosis not present

## 2023-08-17 DIAGNOSIS — Z8619 Personal history of other infectious and parasitic diseases: Secondary | ICD-10-CM

## 2023-08-17 DIAGNOSIS — K648 Other hemorrhoids: Secondary | ICD-10-CM | POA: Diagnosis not present

## 2023-08-17 DIAGNOSIS — K625 Hemorrhage of anus and rectum: Secondary | ICD-10-CM

## 2023-08-17 DIAGNOSIS — K219 Gastro-esophageal reflux disease without esophagitis: Secondary | ICD-10-CM | POA: Diagnosis not present

## 2023-08-17 DIAGNOSIS — K649 Unspecified hemorrhoids: Secondary | ICD-10-CM

## 2023-08-17 DIAGNOSIS — Z85038 Personal history of other malignant neoplasm of large intestine: Secondary | ICD-10-CM | POA: Diagnosis not present

## 2023-08-17 NOTE — Patient Instructions (Addendum)
 We will plan for liver labs in July.   Cirrhosis Lifestyle Recommendations:  High-protein diet from a primarily plant-based diet. Avoid red meat.  No raw or undercooked meat, seafood, or shellfish. Low-fat/cholesterol/carbohydrate diet. Limit sodium to no more than 2000 mg/day including everything that you eat and drink. Recommend at least 30 minutes of aerobic and resistance exercise 3 days/week. Limit Tylenol to 2000 mg daily.   Please monitor your heart rate and blood pressure.  Heart rate and BP goals below: Heart rate goal 50s-60s BP goal: Less than or equal to 130/80  If HR stays consistently above 70-80 and BP elevated please let me know.  For now avoid the carvedilol.  Follow a low-fat diet to help with your right upper quadrant pain and gallbladder disease.   If you begin to have worsening constipation again I want you to start taking a stool softener.  I want you to use Preparation H a couple times a day as needed when you are having rectal bleeding.  Follow-up in 6 months.  Sooner if needed.  It was a pleasure to see you today. I want to create trusting relationships with patients. If you receive a survey regarding your visit,  I greatly appreciate you taking time to fill this out on paper or through your MyChart. I value your feedback.  Julian Obey, MSN, FNP-BC, AGACNP-BC [facility]

## 2023-09-07 ENCOUNTER — Encounter: Payer: Self-pay | Admitting: General Surgery

## 2023-09-07 ENCOUNTER — Ambulatory Visit (INDEPENDENT_AMBULATORY_CARE_PROVIDER_SITE_OTHER): Admitting: General Surgery

## 2023-09-07 VITALS — BP 110/76 | HR 60 | Temp 97.9°F | Resp 12 | Ht 65.0 in | Wt 143.0 lb

## 2023-09-07 DIAGNOSIS — K7460 Unspecified cirrhosis of liver: Secondary | ICD-10-CM

## 2023-09-07 DIAGNOSIS — K8020 Calculus of gallbladder without cholecystitis without obstruction: Secondary | ICD-10-CM

## 2023-09-07 NOTE — Progress Notes (Signed)
 [facility] History and Physical  Reason for Referral:Gallstones Referring Physician:  ED   Chief Complaint   New Patient (Initial Visit)     Ariana Long is a 68 y.o. female.  HPI:   Discussed the use of AI scribe software for clinical note transcription with the patient, who gave verbal consent to proceed.  History of Present Illness Ariana Long is a 68 year old female with cirrhosis due to hepatitis C who presents with abdominal pain.  She experienced abdominal pain lasting about five hours, initially located in the mid abdomen around her umbilicus per her report.  The pain was described as dull and severe at the time, but she has not experienced similar pain since leaving the emergency department.  An ultrasound conducted in the emergency department revealed gallstones but no signs of cholecystitis. She has a history of gallstones noted in previous imaging studies. No fever, nausea, vomiting, diarrhea, constipation, hematochezia, or urinary symptoms were reported. She also reported no similar pain episodes since the emergency department visit.  Her past medical history includes cirrhosis due to hepatitis C, diagnosed following a liver biopsy in 2007 showing chronic hepatitis with mild activity and early fibrosis. An MRI in 2015 showed cirrhotic changes. She has been followed regularly with imaging and labs to monitor her liver condition. She underwent surgery in 2018 for colorectal cancer resection and has a history of kidney stones treated with lithotripsy.  She denies alcohol and tobacco use. She recalls eating a large amount of food, including homemade stew, chicken parmesan, baked sweet potato, and broccoli, on the day of the pain episode, which she later thought might have contributed to her symptoms.     Pathology 2007 liver biopsy:    LIVER, BIOPSIES: CHRONIC HEPATITIS WITH MILD ACTIVITY AND EARLY   BRIDGING FIBROSIS BETWEEN THE PORTAL AREAS. SEE  COMMENT.    Past Medical History:  Diagnosis Date   Cirrhosis of liver (HCC) 01/26/2011   FIBROSIS on liver biopsy in 2007, U/S  03/31/2013= mild diffuse hepatic steatosis and /or hepatocellular disease without focal hepatic parenchymal abnormality. per Dr. Delene Feinstein (2012) pt is immune to hep A and Hep B. per 12/06/13 note from Liver clinic-pt had MRI which showed cirrhosis 2015.   Diverticulitis of colon    Elevated AFP 01/26/2011   Gall stones    Hematuria 12/12/2014   Hemorrhoid 12/01/2012   Hemorrhoids    Hepatitis C 01/26/2011   2005 non responder, genotype 1, Gr 1-2, Stage 3 . Treated with Harvoni at the Liver clinic, responder   Hidradenitis    History of hiatal hernia    History of kidney stones    Hx: UTI (urinary tract infection)    Kidney stones 10/2011   Osteoporosis    Palpitations    Pre-diabetes    Primary cancer of hepatic flexure of colon (HCC) 06/15/2017   Serrated adenoma of colon 11/2011   Splenomegaly 01/26/2011   Thrombocytopenia (HCC) 01/26/2011   Tubular adenoma    Vulval lesion 12/14/2013   Has kissing lesions thin and puffy and paler i color about 5-6- mm in diameter.Dr Monty App in to co examine will try temovate  and recheck in 3 months    Past Surgical History:  Procedure Laterality Date   AXILLARY SURGERY Bilateral 1979   "glands removed"   BREAST BIOPSY Right 2011   BREAST EXCISIONAL BIOPSY Bilateral 25 years ago   Benign, fatty tumors   CESAREAN SECTION     x 2  COLECTOMY  06/15/2017   LAPAROSCOPIC ASSISTED RIGHT COLECTOMY, ERAS PATHWAY/notes 06/15/2017   COLONOSCOPY  07/31/2005   Rourk-Normal rectum/Normal colonoscopy/Repeat screening colonoscopy ten years   COLONOSCOPY  11/16/2011   RMR: Friable anorectum-likely source of hematochezia/ LARGE 1.2x2.5CM SERRATED ADENOMA resected from ascending colon 7CM DISTAL TO ICV, hyperplastic polyp removed    COLONOSCOPY N/A 07/18/2012   Dr. Riley Cheadle- normal appearing rectal mucosa. no residual polpy tissue  seen at tatoo location. remainder of colonic mucosa appeared entirely normal. bx = tubular adenoma   COLONOSCOPY N/A 08/29/2015   Dr. Riley Cheadle: single 12 mm polyp in ascending colon, 4 mm polyp ascending. Sessile serrated adenoma. 3 year surveillance   COLONOSCOPY N/A 05/19/2017   4X2 cm sessile lesion at hepatic flexdure, unable to be completely removed   COLONOSCOPY N/A 01/18/2019   Dr. Riley Cheadle: Several small polyps removed, hyperplastic.  Next colonoscopy in 5 years.   COLONOSCOPY WITH PROPOFOL  N/A 04/09/2023   Procedure: COLONOSCOPY WITH PROPOFOL ;  Surgeon: Suzette Espy, MD;  [facility];  Service: Endoscopy;  Laterality: N/A;  7:30 am, asa 3   ESOPHAGEAL BANDING N/A 08/29/2015   Procedure: ESOPHAGEAL BANDING;  Surgeon: Suzette Espy, MD;  [facility];  Service: Endoscopy;  Laterality: N/A;   ESOPHAGOGASTRODUODENOSCOPY N/A 08/29/2015   Dr. Riley Cheadle: non-critical Schatzki's ring, somewhat prominent small submucosal vessels without obvious varices. hyperplastic gastric polyp. 2 year surveillance   ESOPHAGOGASTRODUODENOSCOPY (EGD) WITH PROPOFOL  N/A 04/09/2023   Procedure: ESOPHAGOGASTRODUODENOSCOPY (EGD) WITH PROPOFOL ;  Surgeon: Suzette Espy, MD;  [facility];  Service: Endoscopy;  Laterality: N/A;   EXTRACORPOREAL SHOCK WAVE LITHOTRIPSY Right 05/27/2021   Procedure: EXTRACORPOREAL SHOCK WAVE LITHOTRIPSY (ESWL);  Surgeon: Marco Severs, MD;  Location: [facility];  Service: Urology;  Laterality: Right;   HEMORRHOID BANDING     LAPAROSCOPIC RIGHT COLECTOMY N/A 06/15/2017   Procedure: LAPAROSCOPIC ASSISTED RIGHT COLECTOMY, ERAS PATHWAY;  Surgeon: Boyce Byes, MD;  Location: [facility] OR;  Service: General;  Laterality: N/A;   POLYPECTOMY  05/19/2017   Procedure: POLYPECTOMY;  Surgeon: Suzette Espy, MD;  [facility];  Service: Endoscopy;;   POLYPECTOMY  01/18/2019   Procedure: POLYPECTOMY;  Surgeon: Suzette Espy, MD;  [facility];  Service:  Endoscopy;;   POLYPECTOMY  04/09/2023   Procedure: POLYPECTOMY;  Surgeon: Suzette Espy, MD;  [facility];  Service: Endoscopy;;   SKIN LESION EXCISION      Family History  Problem Relation Age of Onset   Heart attack Mother    Stroke Mother    Aneurysm Father    Colon cancer Neg Hx     Social History   Tobacco Use   Smoking status: Never   Smokeless tobacco: Never  Vaping Use   Vaping status: Never Used  Substance Use Topics   Alcohol use: No   Drug use: No    Medications: I have reviewed the patient's current medications. Allergies as of 09/07/2023   No Known Allergies      Medication List        Accurate as of Sep 07, 2023  9:34 AM. If you have any questions, ask your nurse or doctor.          carvedilol  3.125 MG tablet Commonly known as: COREG  Take 1 tablet (3.125 mg total) by mouth 2 (two) times daily.   celecoxib 200 MG capsule Commonly known as: CELEBREX Take 200 mg by mouth 2 (two) times daily as needed.  latanoprost 0.005 % ophthalmic solution Commonly known as: XALATAN 1 drop at bedtime.   Magnesium 250 MG Tabs Take 1 tablet by mouth daily.   milk thistle 175 MG tablet Take 1,752 mg by mouth.   Quercetin 250 MG Tabs Take 500 mg by mouth.   vitamin C 1000 MG tablet Take 1,000 mg by mouth daily.   Vitamin D3 50 MCG (2000 UT) Tabs Take 6,000 Units by mouth daily.   zinc gluconate 50 MG tablet Take 50 mg by mouth daily.         ROS:  A comprehensive review of systems was negative except for: Cardiovascular: positive for varicose veins Musculoskeletal: positive for neck pain  Blood pressure 110/76, pulse 60, temperature 97.9 F (36.6 C), temperature source Oral, resp. rate 12, height 5\' 5"  (1.651 m), weight 143 lb (64.9 kg), SpO2 96%.  Physical Exam GENERAL: Alert, cooperative, well developed, no acute distress. HEENT: Normocephalic, normal oropharynx, moist mucous membranes. CHEST: Clear to auscultation  bilaterally, no wheezes, rhonchi, or crackles. CARDIOVASCULAR: Normal heart rate and rhythm ABDOMEN: Soft, non-tender, non-distended, without organomegaly, normal bowel sounds. Midline scar from colon surgery. EXTREMITIES: No cyanosis, edema, or leg swelling. NEUROLOGICAL: Cranial nerves grossly intact, moves all extremities without gross motor or sensory deficit.  Results:  CLINICAL DATA:  [id number] Abdominal pain [id number].   EXAM: ULTRASOUND ABDOMEN LIMITED RIGHT UPPER QUADRANT   COMPARISON:  CT scan abdomen and pelvis from earlier the same day.   FINDINGS: Gallbladder:   Physiologically distended and contains small-to-moderate volume dependent gallstones. No abnormal wall thickening. No pericholecystic free fluid. Sonographic Murphy's sign was negative.   Common bile duct:   Diameter: Up to 4 mm.  No intrahepatic bile duct dilation.   Liver:   There is increased hepatic echogenicity which reduces the sensitivity of ultrasound for the detection of focal masses. That being said, no focal mass is identified. Portal vein is patent on color Doppler imaging with normal direction of blood flow towards the liver.   Other: None.   IMPRESSION: *Cholelithiasis without acute cholecystitis. *Increased hepatic echogenicity, a nonspecific finding that is most commonly seen on the basis of steatosis in the absence of known liver disease.     Electronically Signed   By: Beula Brunswick M.D.   On: 08/09/2023 09:04    Assessment and Plan: Assessment and Plan Assessment & Plan Cholelithiasis Cholelithiasis with gallstones identified. Atypical abdominal pain resolved post-ED visit. Differential includes peptic ulcer disease, gastritis, GERD. - Monitor symptoms and dietary triggers, especially greasy or fatty foods. - I Will Communicate with Julian Obey, PA and Dr Riley Cheadle regarding cirrhosis and potential surgery. - Advise to report pain recurrence, especially postprandial, and contact  clinic if symptoms worsen.  Cirrhosis of liver Cirrhosis with chronic hepatitis and mild fibrosis on biopsy in 2007. Liver condition complicates surgical interventions due to bleeding risk and decompensation.  - Will discuss with GI  Will see patient back in 1 month and see if any biliary colic symptoms. Likely GI will want a repeat liver biopsy if we end up and need to do a cholecystectomy.     All questions were answered to the satisfaction of the patient.  Future Appointments  Date Time Provider Department Center  10/05/2023  9:30 AM Awilda Bogus, MD [facility] None  03/10/2024  8:15 AM [facility] None  03/17/2024  8:30 AM Aurther Blue, NP [facility] None   Awilda Bogus 09/07/2023, 9:34 AM

## 2023-09-07 NOTE — Patient Instructions (Signed)
Cholelithiasis  Cholelithiasis is a disease in which gallstones form in the gallbladder. The gallbladder is an organ that stores bile. Bile is a fluid that helps to digest fats. Gallstones begin as small crystals and can slowly grow into stones. They may cause no symptoms until they block the gallbladder duct, or cystic duct, when the gallbladder tightens, or contracts, after food is eaten. This can cause pain and is known as a gallbladder attack, or biliary colic. There are two main types of gallstones: Cholesterol stones. These are the most common type of gallstone. These stones are made of hardened cholesterol and are usually yellow-green in color. Pigment stones. These are dark in color and are made of a red-yellow substance, called bilirubin,that forms when hemoglobin from red blood cells breaks down. What are the causes? This condition may be caused by too little or too much of the substances that are in bile. This can happen if the bile: Has too much bilirubin. This can happen in certain blood diseases, such as sickle cell anemia. Has too much cholesterol. Does not have enough bile salts. These salts help the body absorb and digest fats. It can also happen if the gallbladder is not emptying completely. This is common during pregnancy. What increases the risk? The following factors may make you more likely to develop this condition: Being older than 70 years of age. Eating a diet that is heavy in fried foods, fat, and refined carbohydrates, such as white bread and white rice. Being female. Having multiple pregnancies. Using medicines that contain female hormones (estrogen) for a long time. Having certain medical problems, such as: Diabetes mellitus. Obesity. Cystic fibrosis. Crohn's disease. Cirrhosis or other long-term (chronic) liver disease. Certain blood diseases, such as sickle cell anemia or leukemia. Having a family history of gallstones. Losing weight quickly. What are the  signs or symptoms? In many cases, having gallstones causes no symptoms. These are called silent gallstones. If a gallstone blocks your bile duct, it can cause a gallbladder attack. The main symptom of a gallbladder attack is sudden pain in the upper right part of the abdomen. The pain: Usually comes at night or after eating. Can last for one hour or more. Can spread to your right shoulder, back, or chest. Can feel like indigestion. This is discomfort, burning, or fullness in your upper abdomen. If the bile duct is blocked for more than a few hours, it can cause an infection or inflammation of your gallbladder (cholecystitis), liver, or pancreas. This can cause: Nausea or vomiting. Bloating. Pain in your abdomen that lasts for 5 hours or longer. Tenderness in your upper abdomen, often in the upper right section and under your rib cage. Fever or chills. Skin or the white parts of your eyes turning yellow (jaundice). This usually happens when a stone has blocked bile from passing through the bile duct. Dark pee (urine) or light-colored poop (stools). How is this diagnosed? This condition may be diagnosed based on: A physical exam. Your medical history. Ultrasound. CT scan. MRI. You may also have other tests, including: Blood tests to check for infection or inflammation. The HIDA scan to see the gallbladder and the bile ducts. An endoscope to check for blockage in the bile ducts. How is this treated? Treatment depends on the severity of your symptoms. Silent gallstones do not need treatment. You may need treatment if a blockage causes a gallbladder attack or other symptoms. Treatment may include: If symptoms are mild, you may care for yourself at home.  For mild symptoms: Stop eating and drinking for 12-24 hours. You may drink water and clear liquids. This helps to "cool down" your gallbladder. After 1 or 2 days, eat a diet of simple or clear foods, such as broths and crackers. Take medicines  for pain or nausea. Take antibiotics if you have an infection. If symptoms are severe, you may: Stay in the hospital for pain control or to treat severe infection. Have surgery to remove the gallbladder (cholecystectomy). This is the most common treatment if all other treatments have not worked. Take medicines to break up gallstones. Medicines may be used for up to 6-12 months. Have an procedure to capture and remove gallstones. Follow these instructions at home: Medicines Take over-the-counter and prescription medicines only as told by your health care provider. If you were prescribed antibiotics, take them as told by your provider. Do not stop using the antibiotic even if you start to feel better. Ask your provider if the medicine prescribed to you requires you to avoid driving or using machinery. Eating and drinking Drink enough fluid to keep your pee pale yellow. This is important during a gallbladder attack. Water and clear liquids are preferred. Follow a healthy diet. This includes: Reducing fatty foods, such as fried food and foods high in cholesterol. Reducing refined carbohydrates, such as white bread and white rice. Eating more fiber. Aim for foods such as almonds, fruit, and beans. General instructions Do not use any products that contain nicotine or tobacco. These products include cigarettes, chewing tobacco, and vaping devices, such as e-cigarettes. If you need help quitting, ask your provider. Maintain a healthy weight. Keep all follow-up visits. These may include seeing a specialist or a Careers adviser. Where to find more information [employer] of Diabetes and Digestive and Kidney Diseases: [email or web address] Contact a health care provider if: You think you have had a gallbladder attack. You have been diagnosed with silent gallstones and you develop indigestion or pain in your abdomen. You have pain from a gallbladder attack that lasts for more than 2 hours. You begin to have  attacks more often. You have nausea. You have dark pee or light-colored poop. Get help right away if: You have pain in your abdomen that lasts for more than 5 hours or is getting worse. You have a fever or chills. You have vomiting that does not go away. You develop jaundice. This information is not intended to replace advice given to you by your health care provider. Make sure you discuss any questions you have with your health care provider. Document Revised: 02/02/2022 Document Reviewed: 02/02/2022 Elsevier Patient Education  2024 [employer].

## 2023-09-17 DIAGNOSIS — M159 Polyosteoarthritis, unspecified: Secondary | ICD-10-CM | POA: Diagnosis not present

## 2023-09-17 DIAGNOSIS — Z6822 Body mass index (BMI) 22.0-22.9, adult: Secondary | ICD-10-CM | POA: Diagnosis not present

## 2023-09-17 DIAGNOSIS — M62838 Other muscle spasm: Secondary | ICD-10-CM | POA: Diagnosis not present

## 2023-09-28 DIAGNOSIS — M2560 Stiffness of unspecified joint, not elsewhere classified: Secondary | ICD-10-CM | POA: Diagnosis not present

## 2023-09-28 DIAGNOSIS — M6281 Muscle weakness (generalized): Secondary | ICD-10-CM | POA: Diagnosis not present

## 2023-09-28 DIAGNOSIS — M62838 Other muscle spasm: Secondary | ICD-10-CM | POA: Diagnosis not present

## 2023-09-29 DIAGNOSIS — M2560 Stiffness of unspecified joint, not elsewhere classified: Secondary | ICD-10-CM | POA: Diagnosis not present

## 2023-09-29 DIAGNOSIS — M62838 Other muscle spasm: Secondary | ICD-10-CM | POA: Diagnosis not present

## 2023-09-29 DIAGNOSIS — M6281 Muscle weakness (generalized): Secondary | ICD-10-CM | POA: Diagnosis not present

## 2023-10-04 ENCOUNTER — Ambulatory Visit: Attending: Cardiovascular Disease | Admitting: Cardiovascular Disease

## 2023-10-04 VITALS — BP 106/74 | HR 78 | Ht 65.0 in | Wt 143.0 lb

## 2023-10-04 DIAGNOSIS — Z87898 Personal history of other specified conditions: Secondary | ICD-10-CM | POA: Insufficient documentation

## 2023-10-04 DIAGNOSIS — M62838 Other muscle spasm: Secondary | ICD-10-CM | POA: Diagnosis not present

## 2023-10-04 DIAGNOSIS — M6281 Muscle weakness (generalized): Secondary | ICD-10-CM | POA: Diagnosis not present

## 2023-10-04 DIAGNOSIS — R002 Palpitations: Secondary | ICD-10-CM | POA: Insufficient documentation

## 2023-10-04 DIAGNOSIS — Z01810 Encounter for preprocedural cardiovascular examination: Secondary | ICD-10-CM | POA: Insufficient documentation

## 2023-10-04 DIAGNOSIS — M2560 Stiffness of unspecified joint, not elsewhere classified: Secondary | ICD-10-CM | POA: Diagnosis not present

## 2023-10-04 NOTE — Progress Notes (Signed)
   Cardiology Office Note    Date:  10/04/2023  ID:  Ariana Long, DOB 02-03-1956, MRN [id number] Cardiologist: Janelle Mediate, MD    History of Present Illness:    Ariana Long is a 68 y.o. female with past medical history of palpitations (prior monitor in 2020 showing PVC's but no significant arrhythmias), cirrhosis, treated Hepatitis C and colon cancer (s/p resection in 2019) Last seen by me in 2022   Her symptoms had improved since reducing her caffeine intake and she had not utilized Metoprolol  recently.   Pesented to [facility] ED on 01/26/2023 for evaluation of chest pain and shortness of breath while getting ready for bed. W/U was negative and d/c home   Seen by PA 02/25/23 more frequent palpitations Not drinking caffeinated beverages and does not consume alcohol. Reports that she has been under increased stress as she keeps her 68-year-old grandson on a daily basis. Had run out of lopressor  Restarted on Toprol  12.5 mg daily No monitor ordered.   Seen in ED 08/09/23 with biliary cholic. May need surgery and repeat liver biopsy for her cirrhosis   She is retired from [employer] but wants to find part time job 4 grand kids 2 youngest she helps watch.     Studies Reviewed:   EKG: EKG is not ordered today. EKG from 01/26/2023 is reviewed and shows NSR, HR 68 with baseline artifact and no acute ST changes.   NST: 02/2016 Blood pressure demonstrated a hypertensive response to exercise. The study is normal. This is a low risk study. Nuclear stress EF: 80%.  Event Monitor: 12/2018 NSR Symptomatic PVC;s No significant NSVT or PaF   Physical Exam:   VS:  BP 106/74 (BP Location: Left Arm, Patient Position: Sitting, Cuff Size: Normal)   Pulse 78   Ht 5\' 5"  (1.651 m)   Wt 143 lb (64.9 kg)   SpO2 99%   BMI 23.80 kg/m    Wt Readings from Last 3 Encounters:  10/04/23 143 lb (64.9 kg)  09/07/23 143 lb (64.9 kg)  08/17/23 142 lb (64.4 kg)     GEN: Well nourished, well developed  female appearing in no acute distress NECK: No JVD; No carotid bruits CARDIAC: RRR, no murmurs, rubs, gallops RESPIRATORY:  Clear to auscultation without rales, wheezing or rhonchi  ABDOMEN: Appears non-distended. No obvious abdominal masses. EXTREMITIES: No clubbing or cyanosis. No pitting edema.  Distal pedal pulses are 2+ bilaterally.   Assessment and Plan:   1. Palpitations - Benign history of PVC;s  Updated TTE   Continue Toprol    2. History of Chest Pain - Prior NST in 2017 was low-risk and showed no evidence of ischemia. She denies any recent chest pain.  - Discussed utility of calcium score - ECG is normal   3. Cirrhosis/Hep C:  - diagnosed 2007 post colon resection 2018 for cancer History of gallstones. Possible need for repeat liver biopsy and cholecystectomy f/u GI From cardiac perspective ok to proceed with surgery    TTE Calcium score  F/U in a year   Signed, Janelle Mediate, MD

## 2023-10-04 NOTE — Patient Instructions (Signed)
 Medication Instructions:  Your physician recommends that you continue on your current medications as directed. Please refer to the Current Medication list given to you today.  *If you need a refill on your cardiac medications before your next appointment, please call your pharmacy*  Lab Work: NONE   If you have labs (blood work) drawn today and your tests are completely normal, you will receive your results only by: MyChart Message (if you have MyChart) OR A paper copy in the mail If you have any lab test that is abnormal or we need to change your treatment, we will call you to review the results.  Testing/Procedures: Your physician has requested that you have an echocardiogram. Echocardiography is a painless test that uses sound waves to create images of your heart. It provides your doctor with information about the size and shape of your heart and how well your heart's chambers and valves are working. This procedure takes approximately one hour. There are no restrictions for this procedure. Please do NOT wear cologne, perfume, aftershave, or lotions (deodorant is allowed). Please arrive 15 minutes prior to your appointment time.  Please note: We ask at that you not bring children with you during ultrasound (echo/ vascular) testing. Due to room size and safety concerns, children are not allowed in the ultrasound rooms during exams. Our front office staff cannot provide observation of children in our lobby area while testing is being conducted. An adult accompanying a patient to their appointment will only be allowed in the ultrasound room at the discretion of the ultrasound technician under special circumstances. We apologize for any inconvenience.  Calcium Score    Follow-Up: At [facility], you and your health needs are our priority.  As part of our continuing mission to provide you with exceptional heart care, our providers are all part of one team.  This team includes your  primary Cardiologist (physician) and Advanced Practice Providers or APPs (Physician Assistants and Nurse Practitioners) who all work together to provide you with the care you need, when you need it.  Your next appointment:   1 year(s)  Provider:   You may see Janelle Mediate, MD or one of the following Advanced Practice Providers on your designated Care Team:   Woodfin Hays, PA-C  [place], [place] Theotis Flake, [place]     We recommend signing up for the patient portal called "MyChart".  Sign up information is provided on this After Visit Summary.  MyChart is used to connect with patients for Virtual Visits (Telemedicine).  Patients are able to view lab/test results, encounter notes, upcoming appointments, etc.  Non-urgent messages can be sent to your provider as well.   To learn more about what you can do with MyChart, go to [email or web address].   Other Instructions Thank you for choosing [facility]!

## 2023-10-05 ENCOUNTER — Encounter: Payer: Self-pay | Admitting: General Surgery

## 2023-10-05 ENCOUNTER — Ambulatory Visit (INDEPENDENT_AMBULATORY_CARE_PROVIDER_SITE_OTHER): Admitting: General Surgery

## 2023-10-05 VITALS — BP 119/77 | HR 62 | Temp 97.9°F | Resp 14 | Ht 65.0 in | Wt 144.0 lb

## 2023-10-05 DIAGNOSIS — K7460 Unspecified cirrhosis of liver: Secondary | ICD-10-CM

## 2023-10-05 DIAGNOSIS — K8020 Calculus of gallbladder without cholecystitis without obstruction: Secondary | ICD-10-CM

## 2023-10-05 NOTE — Patient Instructions (Signed)
 Call with changes in your symptoms or issues. I will let Julian Obey, NP know that you are holding on any surgery for how.

## 2023-10-06 NOTE — Progress Notes (Signed)
 [facility]  Patient has kept up with her diet and is trying to eat better. She has had no RUQ Pain. She is feeling otherwise well.  Discussed her case with GI and they had said if patient was symptomatic and wanted a cholecystectomy it would be safe for [facility] and recommended a liver biopsy given her history of stable cirrhosis.  BP 119/77   Pulse 62   Temp 97.9 F (36.6 C) (Oral)   Resp 14   Ht 5\' 5"  (1.651 m)   Wt 144 lb (65.3 kg)   SpO2 90%   BMI 23.96 kg/m  Soft nontender nondistended  Patient with gallstones and cirrhosis that is stable.    Call with changes in your symptoms or issues. I will let Julian Obey, NP know that you are holding on any surgery for how  PRN Follow up  Future Appointments  Date Time Provider Department Center  10/19/2023  9:30 AM [facility] - ECHO 1 OUTPATIENT [facility] [facility]  11/08/2023  8:00 AM AP-CT 1 [facility]CT [facility] [facility]  03/10/2024  8:15 AM [facility] None  03/17/2024  9:45 AM Burns, Cosette Dinning, NP [facility] None     Deena Farrier, MD [facility] [address] Anise Barlow [place], [place] [address] [phone] (office)

## 2023-10-07 DIAGNOSIS — M62838 Other muscle spasm: Secondary | ICD-10-CM | POA: Diagnosis not present

## 2023-10-07 DIAGNOSIS — M2560 Stiffness of unspecified joint, not elsewhere classified: Secondary | ICD-10-CM | POA: Diagnosis not present

## 2023-10-07 DIAGNOSIS — M6281 Muscle weakness (generalized): Secondary | ICD-10-CM | POA: Diagnosis not present

## 2023-10-12 DIAGNOSIS — M6281 Muscle weakness (generalized): Secondary | ICD-10-CM | POA: Diagnosis not present

## 2023-10-12 DIAGNOSIS — M2560 Stiffness of unspecified joint, not elsewhere classified: Secondary | ICD-10-CM | POA: Diagnosis not present

## 2023-10-12 DIAGNOSIS — M62838 Other muscle spasm: Secondary | ICD-10-CM | POA: Diagnosis not present

## 2023-10-14 DIAGNOSIS — M2560 Stiffness of unspecified joint, not elsewhere classified: Secondary | ICD-10-CM | POA: Diagnosis not present

## 2023-10-14 DIAGNOSIS — M6281 Muscle weakness (generalized): Secondary | ICD-10-CM | POA: Diagnosis not present

## 2023-10-14 DIAGNOSIS — M62838 Other muscle spasm: Secondary | ICD-10-CM | POA: Diagnosis not present

## 2023-10-19 ENCOUNTER — Ambulatory Visit (HOSPITAL_COMMUNITY)
Admission: RE | Admit: 2023-10-19 | Discharge: 2023-10-19 | Disposition: A | Discharge status: Home / Self Care | Source: Ambulatory Visit | Attending: Cardiovascular Disease | Admitting: Cardiovascular Disease

## 2023-10-19 ENCOUNTER — Ambulatory Visit: Payer: Self-pay | Admitting: Cardiovascular Disease

## 2023-10-19 DIAGNOSIS — Z01810 Encounter for preprocedural cardiovascular examination: Secondary | ICD-10-CM | POA: Insufficient documentation

## 2023-10-19 DIAGNOSIS — Z79899 Other long term (current) drug therapy: Secondary | ICD-10-CM

## 2023-10-19 DIAGNOSIS — R002 Palpitations: Secondary | ICD-10-CM

## 2023-10-19 DIAGNOSIS — Z87898 Personal history of other specified conditions: Secondary | ICD-10-CM | POA: Insufficient documentation

## 2023-10-19 LAB — ECHOCARDIOGRAM COMPLETE
Area-P 1/2: 2.83 cm2
S' Lateral: 2.3 cm

## 2023-10-19 NOTE — Telephone Encounter (Signed)
Patient is returning LPN's call. Please advise. 

## 2023-10-19 NOTE — Progress Notes (Signed)
*  PRELIMINARY RESULTS* Echocardiogram 2D Echocardiogram has been performed.  Bernis Brisker 10/19/2023, 10:08 AM

## 2023-10-20 DIAGNOSIS — M6281 Muscle weakness (generalized): Secondary | ICD-10-CM | POA: Diagnosis not present

## 2023-10-20 DIAGNOSIS — M2560 Stiffness of unspecified joint, not elsewhere classified: Secondary | ICD-10-CM | POA: Diagnosis not present

## 2023-10-20 DIAGNOSIS — M62838 Other muscle spasm: Secondary | ICD-10-CM | POA: Diagnosis not present

## 2023-11-01 DIAGNOSIS — Z6822 Body mass index (BMI) 22.0-22.9, adult: Secondary | ICD-10-CM | POA: Diagnosis not present

## 2023-11-01 DIAGNOSIS — M25551 Pain in right hip: Secondary | ICD-10-CM | POA: Diagnosis not present

## 2023-11-01 DIAGNOSIS — M159 Polyosteoarthritis, unspecified: Secondary | ICD-10-CM | POA: Diagnosis not present

## 2023-11-08 ENCOUNTER — Ambulatory Visit (HOSPITAL_COMMUNITY)
Admission: RE | Admit: 2023-11-08 | Discharge: 2023-11-08 | Disposition: A | Discharge status: Home / Self Care | Source: Ambulatory Visit | Attending: Cardiovascular Disease | Admitting: Cardiovascular Disease

## 2023-11-08 DIAGNOSIS — Z87898 Personal history of other specified conditions: Secondary | ICD-10-CM | POA: Insufficient documentation

## 2023-11-09 ENCOUNTER — Other Ambulatory Visit: Payer: Self-pay

## 2023-11-09 ENCOUNTER — Emergency Department (HOSPITAL_COMMUNITY)

## 2023-11-09 ENCOUNTER — Emergency Department (HOSPITAL_COMMUNITY)
Admission: EM | Admit: 2023-11-09 | Discharge: 2023-11-09 | Disposition: A | Discharge status: Home / Self Care | Attending: Emergency Medicine | Admitting: Emergency Medicine

## 2023-11-09 ENCOUNTER — Ambulatory Visit: Admitting: Adult Health

## 2023-11-09 ENCOUNTER — Encounter (HOSPITAL_COMMUNITY): Payer: Self-pay | Admitting: Emergency Medicine

## 2023-11-09 DIAGNOSIS — Z85038 Personal history of other malignant neoplasm of large intestine: Secondary | ICD-10-CM | POA: Diagnosis not present

## 2023-11-09 DIAGNOSIS — R0789 Other chest pain: Secondary | ICD-10-CM | POA: Insufficient documentation

## 2023-11-09 DIAGNOSIS — R079 Chest pain, unspecified: Secondary | ICD-10-CM | POA: Diagnosis not present

## 2023-11-09 LAB — CBC
HCT: 39.2 % (ref 36.0–46.0)
Hemoglobin: 12.8 g/dL (ref 12.0–15.0)
MCH: 30.1 pg (ref 26.0–34.0)
MCHC: 32.7 g/dL (ref 30.0–36.0)
MCV: 92.2 fL (ref 80.0–100.0)
Platelets: 160 K/uL (ref 150–400)
RBC: 4.25 MIL/uL (ref 3.87–5.11)
RDW: 13.3 % (ref 11.5–15.5)
WBC: 4.9 K/uL (ref 4.0–10.5)
nRBC: 0 % (ref 0.0–0.2)

## 2023-11-09 LAB — BASIC METABOLIC PANEL WITH GFR
Anion gap: 11 (ref 5–15)
BUN: 14 mg/dL (ref 8–23)
CO2: 23 mmol/L (ref 22–32)
Calcium: 9.2 mg/dL (ref 8.9–10.3)
Chloride: 107 mmol/L (ref 98–111)
Creatinine, Ser: 0.81 mg/dL (ref 0.44–1.00)
GFR, Estimated: >60 mL/min (ref >60)
Glucose, Bld: 89 mg/dL (ref 70–99)
Potassium: 3.5 mmol/L (ref 3.5–5.1)
Sodium: 141 mmol/L (ref 135–145)

## 2023-11-09 LAB — TROPONIN I (HIGH SENSITIVITY)
Troponin I (High Sensitivity): 3 ng/L (ref <18)
Troponin I (High Sensitivity): 3 ng/L (ref <18)

## 2023-11-09 MED ORDER — OMEPRAZOLE 20 MG PO CPDR: 20.0000 mg | DELAYED_RELEASE_CAPSULE | Freq: Every day | ORAL | 0 refills | Status: DC

## 2023-11-09 NOTE — Discharge Instructions (Signed)
 Your workup was reassuring.  Take the new stomach medicine to see if that helps with the symptoms.  Follow-up with your doctor.

## 2023-11-09 NOTE — ED Provider Notes (Signed)
 [place] EMERGENCY DEPARTMENT AT [facility] Provider Note   CSN: [id number] Arrival date & time: 11/09/23  1730     Patient presents with: Chest Pain   Ariana Long is a 68 y.o. female.    Chest Pain Patient presents with chest pain.  Anterior chest.  Mild shortness of breath.  Has had episodes of this for a while now.  Tends to be worse in the mornings.  Also at night when she lays down.  Not associate eating.  Not exertional.  Has had recent echocardiogram and calcium scoring.  Both considered reassuring and thought that cardiac cause of the pain was unlikely.  Does have history of cirrhosis and biliary disease.  States this does not feel like that however.    Past Medical History:  Diagnosis Date   Cirrhosis of liver (HCC) 01/26/2011   FIBROSIS on liver biopsy in 2007, U/S  03/31/2013= mild diffuse hepatic steatosis and /or hepatocellular disease without focal hepatic parenchymal abnormality. per Dr. Noralyn (2012) pt is immune to hep A and Hep B. per 12/06/13 note from Liver clinic-pt had MRI which showed cirrhosis 2015.   Diverticulitis of colon    Elevated AFP 01/26/2011   Gall stones    Hematuria 12/12/2014   Hemorrhoid 12/01/2012   Hemorrhoids    Hepatitis C 01/26/2011   2005 non responder, genotype 1, Gr 1-2, Stage 3 . Treated with Harvoni at the Liver clinic, responder   Hidradenitis    History of hiatal hernia    History of kidney stones    Hx: UTI (urinary tract infection)    Kidney stones 10/2011   Osteoporosis    Palpitations    Pre-diabetes    Primary cancer of hepatic flexure of colon (HCC) 06/15/2017   Serrated adenoma of colon 11/2011   Splenomegaly 01/26/2011   Thrombocytopenia (HCC) 01/26/2011   Tubular adenoma    Vulval lesion 12/14/2013   Has kissing lesions thin and puffy and paler i color about 5-6- mm in diameter.Dr ferguson in to co examine will try temovate  and recheck in 3 months    Prior to Admission medications   Medication Sig  Start Date End Date Taking? Authorizing Provider  Cholecalciferol (VITAMIN D3) 50 MCG (2000 UT) TABS Take 6,000 Units by mouth daily.   Yes [provider]  latanoprost (XALATAN) 0.005 % ophthalmic solution Place 1 drop into both eyes daily. 07/28/23  Yes [provider]  Magnesium 250 MG TABS Take 1 tablet by mouth daily.   Yes [provider]  milk thistle 175 MG tablet Take 175 mg by mouth daily.   Yes [provider]  omeprazole  (PRILOSEC) 20 MG capsule Take 1 capsule (20 mg total) by mouth daily. 11/09/23  Yes [name], MD  POTASSIUM CHLORIDE  PO Take 1 tablet by mouth daily.   Yes [provider]  Quercetin 250 MG TABS Take 500 mg by mouth daily.   Yes [provider]    Allergies: Patient has no known allergies.    Review of Systems  Cardiovascular:  Positive for chest pain.    Updated Vital Signs BP 108/75 (BP Location: Right Arm)   Pulse 61   Temp 98.1 F (36.7 C) (Oral)   Resp 12   Ht 5' 5 (1.651 m)   Wt 65.8 kg   SpO2 98%   BMI 24.13 kg/m   Physical Exam Vitals and nursing note reviewed.  Cardiovascular:     Rate and Rhythm: Regular rhythm.  Pulmonary:     Breath sounds: No decreased breath sounds or wheezing.  Chest:     Chest wall: Tenderness present.     Comments: Mild tenderness to anterior chest. Abdominal:     Tenderness: There is no abdominal tenderness.  Musculoskeletal:     Right lower leg: No edema.     Left lower leg: No edema.  Skin:    Capillary Refill: Capillary refill takes less than 2 seconds.  Neurological:     Mental Status: She is alert.     (all labs ordered are listed, but only abnormal results are displayed) Labs Reviewed  BASIC METABOLIC PANEL WITH GFR  CBC  TROPONIN I (HIGH SENSITIVITY)  TROPONIN I (HIGH SENSITIVITY)    EKG: EKG Interpretation Date/Time:  Tuesday November 09 2023 17:50:18 EDT Ventricular Rate:  73 PR Interval:  162 QRS Duration:  76 QT  Interval:  394 QTC Calculation: 434 R Axis:   56  Text Interpretation: Normal sinus rhythm Low voltage QRS Borderline ECG When compared with ECG of 26-Jan-2023 00:44, No significant change was found Confirmed by [name] [phone]) on 11/09/2023 6:26:19 PM  Radiology: [facility] Chest 2 View Result Date: 11/09/2023 CLINICAL DATA:  Chest pain. EXAM: CHEST - 2 VIEW COMPARISON:  January 26, 2023. FINDINGS: The heart size and mediastinal contours are within normal limits. Both lungs are clear. The visualized skeletal structures are unremarkable. IMPRESSION: No active cardiopulmonary disease. Electronically Signed   By: Lynwood Landy Raddle M.D.   On: 11/09/2023 18:07   CT CARDIAC SCORING (SELF PAY ONLY) Addendum Date: 11/08/2023 ADDENDUM REPORT: 11/08/2023 15:45 EXAM: OVER-READ INTERPRETATION  CT CHEST The following report is an over-read performed by radiologist Dr. Andrea Gasman of [facility] on 11/08/2023. This over-read does not include interpretation of cardiac or coronary anatomy or pathology. The coronary calcium score interpretation by the cardiologist is attached. COMPARISON:  Chest CT 07/27/2017 FINDINGS: Vascular: The included aorta is normal in caliber. Mediastinum/nodes: No adenopathy or mass. Unremarkable esophagus. Lungs: No focal airspace disease. No pulmonary nodule. No pleural fluid. The included airways are patent. Upper abdomen: No acute or unexpected findings. Musculoskeletal: There are no acute or suspicious osseous abnormalities. Thoracic spondylosis. IMPRESSION: No acute or unexpected extracardiac findings. Electronically Signed   By: Andrea Gasman M.D.   On: 11/08/2023 15:45   Result Date: 11/08/2023 CLINICAL DATA:  57F for cardiovascular disease risk stratification EXAM: Coronary Calcium Score TECHNIQUE: A gated, non-contrast computed tomography scan of the heart was performed using 3mm slice thickness. Axial images were analyzed on a dedicated workstation. Calcium scoring  of the coronary arteries was performed using the Agatston method. FINDINGS: Coronary Calcium Score: Left main: 0 Left anterior descending artery: 44.4 Left circumflex artery: 0 Right coronary artery: 1.05 Total: 45.5 Percentile: 69th Pericardium: Normal. Mild atherosclerosis of the aortic root. Non-cardiac: See separate report from [facility]. IMPRESSION: 1. Coronary calcium score of 45.5. This was 69th percentile for age-, race-, and sex-matched controls. 2. Mild atherosclerosis of the aortic root. RECOMMENDATIONS: Coronary artery calcium (CAC) score is a strong predictor of incident coronary heart disease (CHD) and provides predictive information beyond traditional risk factors. CAC scoring is reasonable to use in the decision to withhold, postpone, or initiate statin therapy in intermediate-risk or selected borderline-risk asymptomatic adults (age 43-75 years and LDL-C >=70 to <190 mg/dL) who do not have diabetes or established atherosclerotic cardiovascular disease (ASCVD).* In intermediate-risk (10-year ASCVD risk >=7.5% to <20%) adults or selected borderline-risk (10-year ASCVD risk >=5% to <  7.5%) adults in whom a CAC score is measured for the purpose of making a treatment decision the following recommendations have been made: If CAC=0, it is reasonable to withhold statin therapy and reassess in 5 to 10 years, as long as higher risk conditions are absent (diabetes mellitus, family history of premature CHD in first degree relatives (males <55 years; females <65 years), cigarette smoking, or LDL >=190 mg/dL). If CAC is 1 to 99, it is reasonable to initiate statin therapy for patients >=46 years of age. If CAC is >=100 or >=75th percentile, it is reasonable to initiate statin therapy at any age. Cardiology referral should be considered for patients with CAC scores >=400 or >=75th percentile. *2018 AHA/ACC/AACVPR/AAPA/ABC/ACPM/ADA/AGS/APhA/ASPC/NLA/PCNA Guideline on the Management of Blood Cholesterol: A  Report of the American College of Cardiology/American Heart Association Task Force on Clinical Practice Guidelines. J Am Coll Cardiol. 2019;73(24):3168-3209. Annabella Scarce, MD Electronically Signed: By: Annabella Scarce M.D. On: 11/08/2023 10:50     Procedures   Medications Ordered in the ED - No data to display                                  Medical Decision Making Amount and/or Complexity of Data Reviewed Labs: ordered. Radiology: ordered.  Risk Prescription drug management.   Patient with chest pain.  Anterior chest.  Does have history of biliary disease but this does not appear to be food related.  Also more often chest and abdomen nontender.   Will get x-ray and blood work but also also had recent cardiac workup that was reassuring.  Blood work reassuring here.  Troponin negative.  With symptoms worse when laying down does raise thought of cause such as reflux.  Will start on Prilosec and fat follow-up with PCP.  Will discharge home     Final diagnoses:  Nonspecific chest pain    ED Discharge Orders          Ordered    omeprazole  (PRILOSEC) 20 MG capsule  Daily        11/09/23 2106               [name], MD 11/09/23 2314

## 2023-11-09 NOTE — ED Triage Notes (Signed)
 Pt presents with mid-sternal CP with SOB, approx 45 minutes ago.

## 2023-11-09 NOTE — Telephone Encounter (Signed)
-----   Message from Maude Emmer sent at 11/08/2023 11:44 AM EDT ----- Calcium score above average for age Not bad enough to warrant ETT Needs updated lipid and liver Start statin if LDL > 100 ----- Message ----- From: Interface, Rad Results In Sent: 11/08/2023  10:53 AM EDT To: Maude JAYSON Emmer, MD

## 2023-11-09 NOTE — Telephone Encounter (Signed)
 Patient will have fasting lab done at [facility]    The patient has been notified of the result and verbalized understanding.  All questions (if any) were answered. Bernett Dorothyann LABOR, RN 11/09/2023 9:15 AM

## 2023-11-11 DIAGNOSIS — H25813 Combined forms of age-related cataract, bilateral: Secondary | ICD-10-CM | POA: Diagnosis not present

## 2023-11-11 DIAGNOSIS — H40013 Open angle with borderline findings, low risk, bilateral: Secondary | ICD-10-CM | POA: Diagnosis not present

## 2023-11-16 ENCOUNTER — Other Ambulatory Visit (HOSPITAL_COMMUNITY): Payer: Self-pay | Admitting: Family Medicine

## 2023-11-16 DIAGNOSIS — M5030 Other cervical disc degeneration, unspecified cervical region: Secondary | ICD-10-CM | POA: Diagnosis not present

## 2023-11-16 DIAGNOSIS — Z6822 Body mass index (BMI) 22.0-22.9, adult: Secondary | ICD-10-CM | POA: Diagnosis not present

## 2023-11-17 ENCOUNTER — Other Ambulatory Visit: Payer: Self-pay | Admitting: *Deleted

## 2023-11-17 DIAGNOSIS — K7460 Unspecified cirrhosis of liver: Secondary | ICD-10-CM

## 2023-11-17 DIAGNOSIS — K219 Gastro-esophageal reflux disease without esophagitis: Secondary | ICD-10-CM

## 2023-11-17 NOTE — Addendum Note (Signed)
 Addended by: Bladimir Auman L on: 11/17/2023 04:45 PM   Modules accepted: Orders

## 2023-11-19 DIAGNOSIS — H25811 Combined forms of age-related cataract, right eye: Secondary | ICD-10-CM | POA: Diagnosis not present

## 2023-11-23 DIAGNOSIS — K219 Gastro-esophageal reflux disease without esophagitis: Secondary | ICD-10-CM | POA: Diagnosis not present

## 2023-11-23 DIAGNOSIS — K7460 Unspecified cirrhosis of liver: Secondary | ICD-10-CM | POA: Diagnosis not present

## 2023-11-24 ENCOUNTER — Encounter (HOSPITAL_COMMUNITY): Payer: Self-pay

## 2023-11-24 ENCOUNTER — Ambulatory Visit: Payer: Self-pay | Admitting: Gastroenterology

## 2023-11-24 ENCOUNTER — Ambulatory Visit (HOSPITAL_COMMUNITY)

## 2023-11-24 LAB — CBC WITH DIFFERENTIAL/PLATELET
Basophils Absolute: 0 x10E3/uL (ref 0.0–0.2)
Basos: 0 %
EOS (ABSOLUTE): 0.1 x10E3/uL (ref 0.0–0.4)
Eos: 3 %
Hematocrit: 44.2 % (ref 34.0–46.6)
Hemoglobin: 14.2 g/dL (ref 11.1–15.9)
Immature Grans (Abs): 0 x10E3/uL (ref 0.0–0.1)
Immature Granulocytes: 0 %
Lymphocytes Absolute: 1.7 x10E3/uL (ref 0.7–3.1)
Lymphs: 29 %
MCH: 30 pg (ref 26.6–33.0)
MCHC: 32.1 g/dL (ref 31.5–35.7)
MCV: 93 fL (ref 79–97)
Monocytes Absolute: 0.6 x10E3/uL (ref 0.1–0.9)
Monocytes: 10 %
Neutrophils Absolute: 3.3 x10E3/uL (ref 1.4–7.0)
Neutrophils: 57 %
Platelets: 198 x10E3/uL (ref 150–450)
RBC: 4.74 x10E6/uL (ref 3.77–5.28)
RDW: 13.2 % (ref 11.7–15.4)
WBC: 5.7 x10E3/uL (ref 3.4–10.8)

## 2023-11-24 LAB — COMPREHENSIVE METABOLIC PANEL WITH GFR
ALT: 39 IU/L — ABNORMAL HIGH (ref 0–32)
AST: 28 IU/L (ref 0–40)
Albumin: 4.5 g/dL (ref 3.9–4.9)
Alkaline Phosphatase: 99 IU/L (ref 44–121)
BUN/Creatinine Ratio: 14 (ref 12–28)
BUN: 13 mg/dL (ref 8–27)
Bilirubin Total: 0.4 mg/dL (ref 0.0–1.2)
CO2: 21 mmol/L (ref 20–29)
Calcium: 9.7 mg/dL (ref 8.7–10.3)
Chloride: 105 mmol/L (ref 96–106)
Creatinine, Ser: 0.96 mg/dL (ref 0.57–1.00)
Globulin, Total: 2.9 g/dL (ref 1.5–4.5)
Glucose: 90 mg/dL (ref 70–99)
Potassium: 4.1 mmol/L (ref 3.5–5.2)
Sodium: 143 mmol/L (ref 134–144)
Total Protein: 7.4 g/dL (ref 6.0–8.5)
eGFR: 64 mL/min/1.73 (ref >59)

## 2023-11-24 LAB — PROTIME-INR
INR: 1 (ref 0.9–1.2)
Prothrombin Time: 10.9 s (ref 9.1–12.0)

## 2023-11-24 LAB — AFP TUMOR MARKER: AFP, Serum, Tumor Marker: 8.1 ng/mL (ref 0.0–9.2)

## 2023-11-25 ENCOUNTER — Other Ambulatory Visit: Payer: Self-pay | Admitting: *Deleted

## 2023-11-25 DIAGNOSIS — K7460 Unspecified cirrhosis of liver: Secondary | ICD-10-CM

## 2023-11-27 ENCOUNTER — Ambulatory Visit (HOSPITAL_COMMUNITY)

## 2023-12-09 ENCOUNTER — Ambulatory Visit
Admission: RE | Admit: 2023-12-09 | Discharge: 2023-12-09 | Disposition: A | Discharge status: Home / Self Care | Source: Ambulatory Visit | Attending: Family Medicine | Admitting: Family Medicine

## 2023-12-09 DIAGNOSIS — M5030 Other cervical disc degeneration, unspecified cervical region: Secondary | ICD-10-CM

## 2023-12-09 DIAGNOSIS — M50221 Other cervical disc displacement at C4-C5 level: Secondary | ICD-10-CM | POA: Diagnosis not present

## 2023-12-09 DIAGNOSIS — M50321 Other cervical disc degeneration at C4-C5 level: Secondary | ICD-10-CM | POA: Diagnosis not present

## 2023-12-09 DIAGNOSIS — M47812 Spondylosis without myelopathy or radiculopathy, cervical region: Secondary | ICD-10-CM | POA: Diagnosis not present

## 2023-12-17 DIAGNOSIS — M5030 Other cervical disc degeneration, unspecified cervical region: Secondary | ICD-10-CM | POA: Diagnosis not present

## 2023-12-17 DIAGNOSIS — Z6821 Body mass index (BMI) 21.0-21.9, adult: Secondary | ICD-10-CM | POA: Diagnosis not present

## 2023-12-19 DIAGNOSIS — H2512 Age-related nuclear cataract, left eye: Secondary | ICD-10-CM | POA: Diagnosis not present

## 2023-12-31 DIAGNOSIS — H25812 Combined forms of age-related cataract, left eye: Secondary | ICD-10-CM | POA: Diagnosis not present

## 2024-01-13 ENCOUNTER — Encounter: Payer: Self-pay | Admitting: Internal Medicine

## 2024-01-14 DIAGNOSIS — Z6822 Body mass index (BMI) 22.0-22.9, adult: Secondary | ICD-10-CM | POA: Diagnosis not present

## 2024-01-14 DIAGNOSIS — M5030 Other cervical disc degeneration, unspecified cervical region: Secondary | ICD-10-CM | POA: Diagnosis not present

## 2024-01-20 ENCOUNTER — Other Ambulatory Visit: Payer: Self-pay | Admitting: *Deleted

## 2024-01-20 ENCOUNTER — Encounter: Payer: Self-pay | Admitting: *Deleted

## 2024-01-20 DIAGNOSIS — K7460 Unspecified cirrhosis of liver: Secondary | ICD-10-CM

## 2024-01-20 NOTE — Telephone Encounter (Signed)
 Pt informed of [place] appt date, time and instructions. Verbalized understanding.

## 2024-02-09 ENCOUNTER — Other Ambulatory Visit: Payer: Self-pay | Admitting: *Deleted

## 2024-02-09 DIAGNOSIS — K7460 Unspecified cirrhosis of liver: Secondary | ICD-10-CM

## 2024-02-14 ENCOUNTER — Ambulatory Visit (HOSPITAL_COMMUNITY)
Admission: RE | Admit: 2024-02-14 | Discharge: 2024-02-14 | Disposition: A | Discharge status: Home / Self Care | Source: Ambulatory Visit | Attending: Gastroenterology | Admitting: Gastroenterology

## 2024-02-14 DIAGNOSIS — K7460 Unspecified cirrhosis of liver: Secondary | ICD-10-CM | POA: Diagnosis not present

## 2024-02-14 DIAGNOSIS — K8020 Calculus of gallbladder without cholecystitis without obstruction: Secondary | ICD-10-CM | POA: Diagnosis not present

## 2024-02-16 ENCOUNTER — Ambulatory Visit: Payer: Self-pay | Admitting: Gastroenterology

## 2024-02-18 DIAGNOSIS — K7460 Unspecified cirrhosis of liver: Secondary | ICD-10-CM | POA: Diagnosis not present

## 2024-02-19 LAB — HEPATIC FUNCTION PANEL
ALT: 23 IU/L (ref 0–32)
AST: 30 IU/L (ref 0–40)
Albumin: 4.5 g/dL (ref 3.9–4.9)
Alkaline Phosphatase: 84 IU/L (ref 49–135)
Bilirubin Total: 0.6 mg/dL (ref 0.0–1.2)
Bilirubin, Direct: 0.15 mg/dL (ref 0.00–0.40)
Total Protein: 7.4 g/dL (ref 6.0–8.5)

## 2024-02-20 ENCOUNTER — Ambulatory Visit: Payer: Self-pay | Admitting: Gastroenterology

## 2024-02-21 ENCOUNTER — Ambulatory Visit (INDEPENDENT_AMBULATORY_CARE_PROVIDER_SITE_OTHER): Admitting: Internal Medicine

## 2024-02-21 ENCOUNTER — Encounter: Payer: Self-pay | Admitting: Internal Medicine

## 2024-02-21 VITALS — BP 134/79 | HR 66 | Temp 98.2°F | Ht 66.0 in | Wt 144.2 lb

## 2024-02-21 DIAGNOSIS — K7460 Unspecified cirrhosis of liver: Secondary | ICD-10-CM

## 2024-02-21 DIAGNOSIS — Z8619 Personal history of other infectious and parasitic diseases: Secondary | ICD-10-CM

## 2024-02-21 NOTE — Progress Notes (Unsigned)
 Gastroenterology Progress Note    Primary Care Physician:  Marvine Rush, MD Primary Gastroenterologist:  Dr.   Pre-Procedure History & Physical: HPI:  Ariana Long is a 68 y.o. female here for follow-up well compensated HCV related cirrhosis.  Had a bout of abdominal pain in June.  Seen by surgery.  Some concern for biliary colic.  No plans for cholecystectomy;  she has known gallstones no evidence of cholecystitis on prior imaging.  Labs all look good except for slightly elevated ALT of 39.  She is actively being screened for hepatoma with ultrasound every 6 months.  Short grade 2 / grade 3 varix last year.  Intolerant to beta-blocker therapy due to hypotension.  Moving forward, she will be surveilled with periodic EGD  History of colon cancer 1 small adenoma removed last year.  Likely cured with resection years ago.  Due for surveillance colonoscopy 2029.  Seeing Dr. Nishan for palpitations. Was having issues with hemorrhoids.  She has good bowel function this time and no hemorrhoid symptoms.   She does not consume alcohol.  Past Medical History:  Diagnosis Date   Cirrhosis of liver (HCC) 01/26/2011   FIBROSIS on liver biopsy in 2007, U/S  03/31/2013= mild diffuse hepatic steatosis and /or hepatocellular disease without focal hepatic parenchymal abnormality. per Dr. Noralyn (2012) pt is immune to hep A and Hep B. per 12/06/13 note from Liver clinic-pt had MRI which showed cirrhosis 2015.   Diverticulitis of colon    Elevated AFP 01/26/2011   Gall stones    Hematuria 12/12/2014   Hemorrhoid 12/01/2012   Hemorrhoids    Hepatitis C 01/26/2011   2005 non responder, genotype 1, Gr 1-2, Stage 3 . Treated with Harvoni at the Liver clinic, responder   Hidradenitis    History of hiatal hernia    History of kidney stones    Hx: UTI (urinary tract infection)    Kidney stones 10/2011   Osteoporosis    Palpitations    Pre-diabetes    Primary cancer of hepatic flexure of colon (HCC)  06/15/2017   Serrated adenoma of colon 11/2011   Splenomegaly 01/26/2011   Thrombocytopenia 01/26/2011   Tubular adenoma    Vulval lesion 12/14/2013   Has kissing lesions thin and puffy and paler i color about 5-6- mm in diameter.Dr edsel in to co examine will try temovate  and recheck in 3 months    Past Surgical History:  Procedure Laterality Date   AXILLARY SURGERY Bilateral 1979   glands removed   BREAST BIOPSY Right 2011   BREAST EXCISIONAL BIOPSY Bilateral 25 years ago   Benign, fatty tumors   CESAREAN SECTION     x 2   COLECTOMY  06/15/2017   LAPAROSCOPIC ASSISTED RIGHT COLECTOMY, ERAS PATHWAY/notes 06/15/2017   COLONOSCOPY  07/31/2005   Cass Edinger-Normal rectum/Normal colonoscopy/Repeat screening colonoscopy ten years   COLONOSCOPY  11/16/2011   RMR: Friable anorectum-likely source of hematochezia/ LARGE 1.2x2.5CM SERRATED ADENOMA resected from ascending colon 7CM DISTAL TO ICV, hyperplastic polyp removed    COLONOSCOPY N/A 07/18/2012   Dr. Shaaron- normal appearing rectal mucosa. no residual polpy tissue seen at tatoo location. remainder of colonic mucosa appeared entirely normal. bx = tubular adenoma   COLONOSCOPY N/A 08/29/2015   Dr. Shaaron: single 12 mm polyp in ascending colon, 4 mm polyp ascending. Sessile serrated adenoma. 3 year surveillance   COLONOSCOPY N/A 05/19/2017   4X2 cm sessile lesion at hepatic flexdure, unable to be completely removed   COLONOSCOPY N/A  01/18/2019   Dr. Shaaron: Several small polyps removed, hyperplastic.  Next colonoscopy in 5 years.   COLONOSCOPY WITH PROPOFOL  N/A 04/09/2023   Procedure: COLONOSCOPY WITH PROPOFOL ;  Surgeon: Shaaron Lamar HERO, MD;  Location: [facility];  Service: Endoscopy;  Laterality: N/A;  7:30 am, asa 3   ESOPHAGEAL BANDING N/A 08/29/2015   Procedure: ESOPHAGEAL BANDING;  Surgeon: Lamar HERO Shaaron, MD;  Location: [facility];  Service: Endoscopy;  Laterality: N/A;   ESOPHAGOGASTRODUODENOSCOPY N/A 08/29/2015   Dr. Shaaron:  non-critical Schatzki's ring, somewhat prominent small submucosal vessels without obvious varices. hyperplastic gastric polyp. 2 year surveillance   ESOPHAGOGASTRODUODENOSCOPY (EGD) WITH PROPOFOL  N/A 04/09/2023   Procedure: ESOPHAGOGASTRODUODENOSCOPY (EGD) WITH PROPOFOL ;  Surgeon: Shaaron Lamar HERO, MD;  Location: [facility];  Service: Endoscopy;  Laterality: N/A;   EXTRACORPOREAL SHOCK WAVE LITHOTRIPSY Right 05/27/2021   Procedure: EXTRACORPOREAL SHOCK WAVE LITHOTRIPSY (ESWL);  Surgeon: Sherrilee Belvie CROME, MD;  Location: [facility];  Service: Urology;  Laterality: Right;   HEMORRHOID BANDING     LAPAROSCOPIC RIGHT COLECTOMY N/A 06/15/2017   Procedure: LAPAROSCOPIC ASSISTED RIGHT COLECTOMY, ERAS PATHWAY;  Surgeon: Gail Favorite, MD;  Location: [facility] OR;  Service: General;  Laterality: N/A;   POLYPECTOMY  05/19/2017   Procedure: POLYPECTOMY;  Surgeon: Shaaron Lamar HERO, MD;  Location: [facility];  Service: Endoscopy;;   POLYPECTOMY  01/18/2019   Procedure: POLYPECTOMY;  Surgeon: Shaaron Lamar HERO, MD;  Location: [facility];  Service: Endoscopy;;   POLYPECTOMY  04/09/2023   Procedure: POLYPECTOMY;  Surgeon: Shaaron Lamar HERO, MD;  Location: [facility];  Service: Endoscopy;;   SKIN LESION EXCISION      Prior to Admission medications   Medication Sig Start Date End Date Taking? Authorizing Provider  Cholecalciferol (VITAMIN D3) 50 MCG (2000 UT) TABS Take 6,000 Units by mouth daily.   Yes [provider]  fluticasone (CUTIVATE) 0.05 % cream Apply 1 Application topically 2 (two) times daily as needed. 01/28/24  Yes [provider]  latanoprost (XALATAN) 0.005 % ophthalmic solution Place 1 drop into both eyes daily. 07/28/23  Yes [provider]  Magnesium 250 MG TABS Take 1 tablet by mouth daily.   Yes [provider]  milk thistle 175 MG tablet Take 175 mg by mouth daily.   Yes [provider]  POTASSIUM CHLORIDE  PO Take 1 tablet by mouth daily.   Yes [provider]  prednisoLONE acetate (PRED FORTE) 1 % ophthalmic suspension Place 1 drop into the left eye 4 (four) times daily. 01/27/24  Yes [provider]  Quercetin 250 MG TABS Take 500 mg by mouth daily.   Yes [provider]    Allergies as of 02/21/2024   (No Known Allergies)    Family History  Problem Relation Age of Onset   Heart attack Mother    Stroke Mother    Aneurysm Father    Colon cancer Neg Hx     Social History   Socioeconomic History   Marital status: Married    Spouse name: Not on file   Number of children: 2   Years of education: Not on file   Highest education level: Not on file  Occupational History   Occupation: Systems analyst Op    Employer: [employer]  Tobacco Use   Smoking status: Never   Smokeless tobacco: Never  Vaping Use   Vaping status: Never Used  Substance and Sexual Activity   Alcohol use: No   Drug use: No  Sexual activity: Yes    Birth control/protection: Post-menopausal  Other Topics Concern   Not on file  Social History Narrative   Not on file   Social Drivers of Health   Financial Resource Strain: Low Risk  (05/21/2023)   Overall Financial Resource Strain (CARDIA)    Difficulty of Paying Living Expenses: Not hard at all  Food Insecurity: No Food Insecurity (05/21/2023)   Hunger Vital Sign    Worried About Running Out of Food in the Last Year: Never true    Ran Out of Food in the Last Year: Never true  Transportation Needs: No Transportation Needs (04/22/2022)   PRAPARE - Administrator, Civil Service (Medical): No    Lack of Transportation (Non-Medical): No  Physical Activity: Insufficiently Active (05/21/2023)   Exercise Vital Sign    Days of Exercise per Week: 2 days    Minutes of Exercise per Session: 10 min  Stress: No Stress Concern Present (05/21/2023)   [employer] of Occupational Health - Occupational Stress Questionnaire    Feeling of Stress : Only a little  Social  Connections: Socially Integrated (05/21/2023)   Social Connection and Isolation Panel    Frequency of Communication with Friends and Family: More than three times a week    Frequency of Social Gatherings with Friends and Family: Once a week    Attends Religious Services: More than 4 times per year    Active Member of [employer] or Organizations: Yes    Attends Banker Meetings: 1 to 4 times per year    Marital Status: Married  Catering manager Violence: Not At Risk (05/21/2023)   Humiliation, Afraid, Rape, and Kick questionnaire    Fear of Current or Ex-Partner: No    Emotionally Abused: No    Physically Abused: No    Sexually Abused: No    Review of Systems   See HPI, otherwise negative ROS  Physical Exam: BP 134/79 (BP Location: Right Arm, Patient Position: Sitting, Cuff Size: Normal)   Pulse 66   Temp 98.2 F (36.8 C) (Oral)   Ht 5' 6 (1.676 m)   Wt 144 lb 3.2 oz (65.4 kg)   SpO2 98%   BMI 23.27 kg/m  General:   Alert,  Well-developed, well-nourished, pleasant and cooperative in NAD Skin: No cutaneous stigmata of chronic liver disease. clear, no icterus.   Conjunctiva pink. Neck:  Supple; no masses or thyromegaly. No significant cervical adenopathy. Lungs:  Clear throughout to auscultation.   No wheezes, crackles, or rhonchi. No acute distress. Heart:  Regular rate and rhythm; no murmurs, clicks, rubs,  or gallops. Abdomen: Non-distended, normal bowel sounds.  Soft and nontender without appreciable mass or hepatosplenomegaly.  Pulses:  Normal pulses noted. Extremities:  Without clubbing or edema.   Impression/Plan:   Very pleasant 68 year old lady with well compensated HCV related cirrhosis.  Had chronic HCV for decades likely resulting in well compensated cirrhosis.  She does have evidence of portal hypertension with a small varix seen previously and some splenomegaly.  More recently all of her labs looked great.  She had a slightly elevated ALT back in  June.  Recent bout of  abdominal pain may or may not be related to biliary colic.  At this time,no plans for cholecystectomy.  Intolerant of beta-blockade therapy therefore, we will check her esophagus with a periodic EGD  History of colon cancer.  Due for surveillance 2029.  Recommendations:  Liver disease is stable.  Liver continues to be  functioning well  -  well compensated  At this time no indication for cholecystectomy.  However, if symptoms recur consistent with biliary colic would get her back to see Dr. Kallie.  1 to 2 cups of black coffee daily are may actually have a positive effect on your liver  We will continue checking your liver via ultrasound every 6 months.  Updated labs today c-Met, CBC, alpha-fetoprotein and INR  1 year follow-up appointment with us .  May have repeat EGD to be determined.  Surveillance colonoscopy 2029       Notice: This dictation was prepared with Dragon dictation along with smaller phrase technology. Any transcriptional errors that result from this process are unintentional and may not be corrected upon review.

## 2024-02-21 NOTE — Patient Instructions (Signed)
 Good to see you again today!  Liver disease is stable.  Liver continues to be functioning well  -  well compensated  At this time no indication for cholecystectomy.  However, if symptoms recur consistent with biliary colic would get her back to see Dr. Kallie.  1 to 2 cups of black coffee daily are may actually have a positive effect on your liver  We will continue checking your liver via ultrasound every 6 months.  Updated labs today c-Met, CBC, alpha-fetoprotein and INR  1 year follow-up appointment with us .  May have repeat EGD to be determined.  Surveillance colonoscopy 2029

## 2024-02-22 DIAGNOSIS — M542 Cervicalgia: Secondary | ICD-10-CM | POA: Diagnosis not present

## 2024-02-22 LAB — CBC WITH DIFFERENTIAL/PLATELET
Basophils Absolute: 0.1 x10E3/uL (ref 0.0–0.2)
Basos: 1 %
EOS (ABSOLUTE): 0.1 x10E3/uL (ref 0.0–0.4)
Eos: 2 %
Hematocrit: 41.2 % (ref 34.0–46.6)
Hemoglobin: 13.7 g/dL (ref 11.1–15.9)
Immature Grans (Abs): 0 x10E3/uL (ref 0.0–0.1)
Immature Granulocytes: 0 %
Lymphocytes Absolute: 1.5 x10E3/uL (ref 0.7–3.1)
Lymphs: 36 %
MCH: 31.1 pg (ref 26.6–33.0)
MCHC: 33.3 g/dL (ref 31.5–35.7)
MCV: 94 fL (ref 79–97)
Monocytes Absolute: 0.5 x10E3/uL (ref 0.1–0.9)
Monocytes: 12 %
Neutrophils Absolute: 2 x10E3/uL (ref 1.4–7.0)
Neutrophils: 49 %
Platelets: 164 x10E3/uL (ref 150–450)
RBC: 4.4 x10E6/uL (ref 3.77–5.28)
RDW: 13.1 % (ref 11.7–15.4)
WBC: 4.1 x10E3/uL (ref 3.4–10.8)

## 2024-02-22 LAB — COMPREHENSIVE METABOLIC PANEL WITH GFR
ALT: 31 IU/L (ref 0–32)
AST: 32 IU/L (ref 0–40)
Albumin: 4.7 g/dL (ref 3.9–4.9)
Alkaline Phosphatase: 92 IU/L (ref 49–135)
BUN/Creatinine Ratio: 14 (ref 12–28)
BUN: 11 mg/dL (ref 8–27)
Bilirubin Total: 0.4 mg/dL (ref 0.0–1.2)
CO2: 24 mmol/L (ref 20–29)
Calcium: 9.7 mg/dL (ref 8.7–10.3)
Chloride: 103 mmol/L (ref 96–106)
Creatinine, Ser: 0.78 mg/dL (ref 0.57–1.00)
Globulin, Total: 2.9 g/dL (ref 1.5–4.5)
Glucose: 91 mg/dL (ref 70–99)
Potassium: 4.4 mmol/L (ref 3.5–5.2)
Sodium: 141 mmol/L (ref 134–144)
Total Protein: 7.6 g/dL (ref 6.0–8.5)
eGFR: 82 mL/min/1.73 (ref >59)

## 2024-02-22 LAB — AFP TUMOR MARKER: AFP, Serum, Tumor Marker: 6.2 ng/mL (ref 0.0–9.2)

## 2024-02-22 LAB — PROTIME-INR
INR: 1 (ref 0.9–1.2)
Prothrombin Time: 10.5 s (ref 9.1–12.0)

## 2024-03-06 ENCOUNTER — Ambulatory Visit: Admitting: Internal Medicine

## 2024-03-10 ENCOUNTER — Inpatient Hospital Stay: Payer: Medicare Other | Attending: Oncology

## 2024-03-10 DIAGNOSIS — K7460 Unspecified cirrhosis of liver: Secondary | ICD-10-CM | POA: Diagnosis not present

## 2024-03-10 DIAGNOSIS — Z85038 Personal history of other malignant neoplasm of large intestine: Secondary | ICD-10-CM | POA: Insufficient documentation

## 2024-03-10 DIAGNOSIS — R970 Elevated carcinoembryonic antigen [CEA]: Secondary | ICD-10-CM | POA: Insufficient documentation

## 2024-03-10 DIAGNOSIS — C183 Malignant neoplasm of hepatic flexure: Secondary | ICD-10-CM

## 2024-03-10 DIAGNOSIS — Z8619 Personal history of other infectious and parasitic diseases: Secondary | ICD-10-CM | POA: Diagnosis not present

## 2024-03-10 DIAGNOSIS — Z9049 Acquired absence of other specified parts of digestive tract: Secondary | ICD-10-CM | POA: Insufficient documentation

## 2024-03-10 DIAGNOSIS — M810 Age-related osteoporosis without current pathological fracture: Secondary | ICD-10-CM | POA: Insufficient documentation

## 2024-03-10 DIAGNOSIS — Z08 Encounter for follow-up examination after completed treatment for malignant neoplasm: Secondary | ICD-10-CM | POA: Diagnosis not present

## 2024-03-10 LAB — COMPREHENSIVE METABOLIC PANEL WITH GFR
ALT: 42 U/L (ref 0–44)
AST: 38 U/L (ref 15–41)
Albumin: 4.7 g/dL (ref 3.5–5.0)
Alkaline Phosphatase: 95 U/L (ref 38–126)
Anion gap: 10 (ref 5–15)
BUN: 14 mg/dL (ref 8–23)
CO2: 28 mmol/L (ref 22–32)
Calcium: 9.7 mg/dL (ref 8.9–10.3)
Chloride: 106 mmol/L (ref 98–111)
Creatinine, Ser: 0.78 mg/dL (ref 0.44–1.00)
GFR, Estimated: >60 mL/min (ref >60)
Glucose, Bld: 88 mg/dL (ref 70–99)
Potassium: 3.8 mmol/L (ref 3.5–5.1)
Sodium: 144 mmol/L (ref 135–145)
Total Bilirubin: 0.5 mg/dL (ref 0.0–1.2)
Total Protein: 8.1 g/dL (ref 6.5–8.1)

## 2024-03-10 LAB — CBC WITH DIFFERENTIAL/PLATELET
Abs Immature Granulocytes: 0.01 K/uL (ref 0.00–0.07)
Basophils Absolute: 0 K/uL (ref 0.0–0.1)
Basophils Relative: 1 %
Eosinophils Absolute: 0.1 K/uL (ref 0.0–0.5)
Eosinophils Relative: 3 %
HCT: 43.9 % (ref 36.0–46.0)
Hemoglobin: 14.6 g/dL (ref 12.0–15.0)
Immature Granulocytes: 0 %
Lymphocytes Relative: 33 %
Lymphs Abs: 1.5 K/uL (ref 0.7–4.0)
MCH: 30.8 pg (ref 26.0–34.0)
MCHC: 33.3 g/dL (ref 30.0–36.0)
MCV: 92.6 fL (ref 80.0–100.0)
Monocytes Absolute: 0.4 K/uL (ref 0.1–1.0)
Monocytes Relative: 10 %
Neutro Abs: 2.5 K/uL (ref 1.7–7.7)
Neutrophils Relative %: 53 %
Platelets: 166 K/uL (ref 150–400)
RBC: 4.74 MIL/uL (ref 3.87–5.11)
RDW: 13.2 % (ref 11.5–15.5)
WBC: 4.6 K/uL (ref 4.0–10.5)
nRBC: 0 % (ref 0.0–0.2)

## 2024-03-11 LAB — CEA: CEA: 7.4 ng/mL — ABNORMAL HIGH (ref 0.0–4.7)

## 2024-03-17 ENCOUNTER — Inpatient Hospital Stay: Payer: Medicare Other | Admitting: Oncology

## 2024-03-17 DIAGNOSIS — M810 Age-related osteoporosis without current pathological fracture: Secondary | ICD-10-CM | POA: Diagnosis not present

## 2024-03-17 DIAGNOSIS — C183 Malignant neoplasm of hepatic flexure: Secondary | ICD-10-CM | POA: Diagnosis not present

## 2024-03-17 DIAGNOSIS — Z8619 Personal history of other infectious and parasitic diseases: Secondary | ICD-10-CM | POA: Diagnosis not present

## 2024-03-17 DIAGNOSIS — Z85038 Personal history of other malignant neoplasm of large intestine: Secondary | ICD-10-CM | POA: Diagnosis not present

## 2024-03-17 DIAGNOSIS — Z9049 Acquired absence of other specified parts of digestive tract: Secondary | ICD-10-CM | POA: Diagnosis not present

## 2024-03-17 DIAGNOSIS — Z08 Encounter for follow-up examination after completed treatment for malignant neoplasm: Secondary | ICD-10-CM | POA: Diagnosis not present

## 2024-03-17 DIAGNOSIS — R970 Elevated carcinoembryonic antigen [CEA]: Secondary | ICD-10-CM | POA: Diagnosis not present

## 2024-03-17 NOTE — Progress Notes (Signed)
 Ariana Long [address], [place] [address]    Clinic Day:  03/17/2024  Referring physician: Marvine Rush, MD  Patient Care Team: Ariana Rush, MD as PCP - General (Family Medicine) Ariana Maude BROCKS, MD as PCP - Cardiology (Cardiology) Ariana Lamar HERO, MD (Gastroenterology)   ASSESSMENT & PLAN:   Assessment: 1.  Stage II (T2N0) hepatic flexure colon adenocarcinoma: -Status post right colon segmental resection on 06/15/2017, 3.2 cm tumor, 0/12 lymph nodes positive.  MMR normal/MSI low. -CEA a month after surgery was elevated at 6.3.  CT of the chest did not show any metastatic disease. -PET/CT scan on 08/16/2017 did not show any evidence of recurrence or metastatic disease.  Her CEA elevation is likely from her liver disease.  She has a history of hepatitis C and was treated for it. -CT scan of the abdomen pelvis on 02/23/2018 showed postoperative changes of partial right colectomy without definite evidence of local or metastatic disease.  There is mild narrowing in the region of hepatic flexure, presumably at surgical anastomosis. -CEA on 06/01/2018 has increased to 8.9.  Prior CEA 3 months ago was 7.7. -PET CT scan on 06/22/2018 showed no findings of recurrent or metastatic colon cancer.  Asymmetric palatine tonsillar activity, left greater than right. -CT of the abdomen pelvis on 01/11/2019 was negative for any recurrent or metastatic disease. -Patient had a colonoscopy on 01/18/2019 they found 2 polyps that were negative for malignancy. -CEA slightly elevated at 8.0.  We will continue to monitor this closely. -Labs done on 05/31/2019 showed CEA level 8.8. -CT AP done on 09/05/2019 showed no evidence of recurrent malignancy. -Last colonoscopy was on 01/18/2019.   2.  Hepatitis C: -Patient had a blood transfusion before 1974 was told she contracted hepatitis C. -She was treated 5 years ago for hepatitis C.    Plan: 1.  Stage II (T2N0) hepatic flexure colon  adenocarcinoma: - She reports bleeding per rectum when she is constipated. - Labs from 03/10/2024 show unremarkable CBC and CMP with normal LFTs.  CEA is slightly elevated at 7.4 but improved from 1 year ago. - Colonoscopy from 04/09/2023 showed status post right hemicolectomy, one 4 mm polyp in mid sigmoid colon and nonbleeding internal hemorrhoids.  EGD showed single short grade 2 grade 3 varix in the esophagus.  No bleeding stigmata.  Gastric polyp post polypectomy with normal duodenum. - Recommend follow-up in 1 year with repeat CEA and other labs.  Imaging will be done if clinical condition dictates.   2.  Hepatitis C: - She was treated for hep C in the past.    Orders Placed This Encounter  Procedures   CEA    Standing Status:   Future    Expected Date:   03/13/2025    Expiration Date:   03/17/2025   Comprehensive metabolic panel    Standing Status:   Future    Expected Date:   03/13/2025    Expiration Date:   03/17/2025   CBC with Differential    Standing Status:   Future    Expected Date:   03/13/2025    Expiration Date:   03/17/2025    Ariana FORBES Hope, NP   11/14/20259:28 AM  CHIEF COMPLAINT:   Diagnosis: stage II colon cancer    Cancer Staging  Primary cancer of hepatic flexure of colon [facility]) Staging form: Colon and Rectum - Neuroendocine Tumors, AJCC 8th Edition - Clinical stage from 06/15/2017: Stage IIA (cT2, cN0, cM0) - Signed  by Ariana Hai, MD on 08/18/2017    Prior Therapy: Right colon segmental resection on 06/15/2017   Current Therapy:  surveillance    HISTORY OF PRESENT ILLNESS:   Oncology History  Primary cancer of hepatic flexure of colon (HCC)  06/15/2017 Initial Diagnosis   Primary cancer of hepatic flexure of colon (HCC)   06/15/2017 Cancer Staging   Staging form: Colon and Rectum - Neuroendocine Tumors, AJCC 8th Edition - Clinical stage from 06/15/2017: Stage IIA (cT2, cN0, cM0) - Signed by Ariana Hai, MD on 08/18/2017       INTERVAL HISTORY:   [name] is a 68 y.o. female presenting to clinic today for follow up of stage II colon cancer.   Patient had follow-up EGD/colonoscopy on 04/08/2024 which showed status post right hemicolectomy, one 4 mm polyp in mid sigmoid colon and nonbleeding internal hemorrhoids.  EGD showed single short grade 2 grade 3 varix in the esophagus.  No bleeding stigmata.  Gastric polyp post polypectomy with normal duodenum.  Is any interval hospitalizations, surgeries or changes to baseline health.  She met with GI Dr. Shaaron on 02/21/2024 for compensated HCV related cirrhosis.  Everything appeared stable.  She was evaluated in the ED on 11/09/2023 for chest pain.  She had a 2D echo on 10/19/2023 which showed a EF of 60 to 65%.  She was seen on 08/09/2023 and ED for abdominal pain which revealed gallstones.  She followed up with Ariana Long shortly after and she was advised to monitor for symptoms.  Avoid greasy foods.  Would need cholecystectomy if she developed cholecystitis.  Today, she states that she is doing well overall. Her appetite level is at 100%. Her energy level is at 100%.  PAST MEDICAL HISTORY:   Past Medical History: Past Medical History:  Diagnosis Date   Cirrhosis of liver (HCC) 01/26/2011   FIBROSIS on liver biopsy in 2007, U/S  03/31/2013= mild diffuse hepatic steatosis and /or hepatocellular disease without focal hepatic parenchymal abnormality. per Dr. Noralyn (2012) pt is immune to hep A and Hep B. per 12/06/13 note from Liver clinic-pt had MRI which showed cirrhosis 2015.   Diverticulitis of colon    Elevated AFP 01/26/2011   Gall stones    Hematuria 12/12/2014   Hemorrhoid 12/01/2012   Hemorrhoids    Hepatitis C 01/26/2011   2005 non responder, genotype 1, Gr 1-2, Stage 3 . Treated with Harvoni at the Liver clinic, responder   Hidradenitis    History of hiatal hernia    History of kidney stones    Hx: UTI (urinary tract infection)    Kidney stones 10/2011    Osteoporosis    Palpitations    Pre-diabetes    Primary cancer of hepatic flexure of colon (HCC) 06/15/2017   Serrated adenoma of colon 11/2011   Splenomegaly 01/26/2011   Thrombocytopenia 01/26/2011   Tubular adenoma    Vulval lesion 12/14/2013   Has kissing lesions thin and puffy and paler i color about 5-6- mm in diameter.Dr edsel in to co examine will try temovate  and recheck in 3 months    Surgical History: Past Surgical History:  Procedure Laterality Date   AXILLARY SURGERY Bilateral 1979   glands removed   BREAST BIOPSY Right 2011   BREAST EXCISIONAL BIOPSY Bilateral 25 years ago   Benign, fatty tumors   CESAREAN SECTION     x 2   COLECTOMY  06/15/2017   LAPAROSCOPIC ASSISTED RIGHT COLECTOMY, ERAS PATHWAY/notes 06/15/2017   COLONOSCOPY  07/31/2005  Rourk-Normal rectum/Normal colonoscopy/Repeat screening colonoscopy ten years   COLONOSCOPY  11/16/2011   RMR: Friable anorectum-likely source of hematochezia/ LARGE 1.2x2.5CM SERRATED ADENOMA resected from ascending colon 7CM DISTAL TO ICV, hyperplastic polyp removed    COLONOSCOPY N/A 07/18/2012   Dr. Shaaron- normal appearing rectal mucosa. no residual polpy tissue seen at tatoo location. remainder of colonic mucosa appeared entirely normal. bx = tubular adenoma   COLONOSCOPY N/A 08/29/2015   Dr. Shaaron: single 12 mm polyp in ascending colon, 4 mm polyp ascending. Sessile serrated adenoma. 3 year surveillance   COLONOSCOPY N/A 05/19/2017   4X2 cm sessile lesion at hepatic flexdure, unable to be completely removed   COLONOSCOPY N/A 01/18/2019   Dr. Shaaron: Several small polyps removed, hyperplastic.  Next colonoscopy in 5 years.   COLONOSCOPY WITH PROPOFOL  N/A 04/09/2023   Procedure: COLONOSCOPY WITH PROPOFOL ;  Surgeon: Ariana Lamar HERO, MD;  [facility];  Service: Endoscopy;  Laterality: N/A;  7:30 am, asa 3   ESOPHAGEAL BANDING N/A 08/29/2015   Procedure: ESOPHAGEAL BANDING;  Surgeon: Lamar Long Shaaron, MD;  [facility];  Service: Endoscopy;  Laterality: N/A;   ESOPHAGOGASTRODUODENOSCOPY N/A 08/29/2015   Dr. Shaaron: non-critical Schatzki's ring, somewhat prominent small submucosal vessels without obvious varices. hyperplastic gastric polyp. 2 year surveillance   ESOPHAGOGASTRODUODENOSCOPY (EGD) WITH PROPOFOL  N/A 04/09/2023   Procedure: ESOPHAGOGASTRODUODENOSCOPY (EGD) WITH PROPOFOL ;  Surgeon: Ariana Lamar HERO, MD;  [facility];  Service: Endoscopy;  Laterality: N/A;   EXTRACORPOREAL SHOCK WAVE LITHOTRIPSY Right 05/27/2021   Procedure: EXTRACORPOREAL SHOCK WAVE LITHOTRIPSY (ESWL);  Surgeon: Sherrilee Belvie CROME, MD;  Location: [facility];  Service: Urology;  Laterality: Right;   HEMORRHOID BANDING     LAPAROSCOPIC RIGHT COLECTOMY N/A 06/15/2017   Procedure: LAPAROSCOPIC ASSISTED RIGHT COLECTOMY, ERAS PATHWAY;  Surgeon: Gail Favorite, MD;  Location: [facility] OR;  Service: General;  Laterality: N/A;   POLYPECTOMY  05/19/2017   Procedure: POLYPECTOMY;  Surgeon: Ariana Lamar HERO, MD;  [facility];  Service: Endoscopy;;   POLYPECTOMY  01/18/2019   Procedure: POLYPECTOMY;  Surgeon: Ariana Lamar HERO, MD;  [facility];  Service: Endoscopy;;   POLYPECTOMY  04/09/2023   Procedure: POLYPECTOMY;  Surgeon: Ariana Lamar HERO, MD;  [facility];  Service: Endoscopy;;   SKIN LESION EXCISION      Social History: Social History   Socioeconomic History   Marital status: Married    Spouse name: Not on file   Number of children: 2   Years of education: Not on file   Highest education level: Not on file  Occupational History   Occupation: Systems Analyst Op    Employer: [employer]  Tobacco Use   Smoking status: Never   Smokeless tobacco: Never  Vaping Use   Vaping status: Never Used  Substance and Sexual Activity   Alcohol use: No   Drug use: No   Sexual activity: Yes    Birth control/protection: Post-menopausal  Other Topics Concern   Not on file  Social History Narrative    Not on file   Social Drivers of Health   Financial Resource Strain: Low Risk  (05/21/2023)   Overall Financial Resource Strain (CARDIA)    Difficulty of Paying Living Expenses: Not hard at all  Food Insecurity: No Food Insecurity (05/21/2023)   Hunger Vital Sign    Worried About Running Out of Food in the Last Year: Never true    Ran Out of Food in the Last Year: Never  true  Transportation Needs: No Transportation Needs (04/22/2022)   PRAPARE - Administrator, Civil Service (Medical): No    Lack of Transportation (Non-Medical): No  Physical Activity: Insufficiently Active (05/21/2023)   Exercise Vital Sign    Days of Exercise per Week: 2 days    Minutes of Exercise per Session: 10 min  Stress: No Stress Concern Present (05/21/2023)   [employer] of Occupational Health - Occupational Stress Questionnaire    Feeling of Stress : Only a little  Social Connections: Socially Integrated (05/21/2023)   Social Connection and Isolation Panel    Frequency of Communication with Friends and Family: More than three times a week    Frequency of Social Gatherings with Friends and Family: Once a week    Attends Religious Services: More than 4 times per year    Active Member of [employer] or Organizations: Yes    Attends Banker Meetings: 1 to 4 times per year    Marital Status: Married  Catering Manager Violence: Not At Risk (05/21/2023)   Humiliation, Afraid, Rape, and Kick questionnaire    Fear of Current or Ex-Partner: No    Emotionally Abused: No    Physically Abused: No    Sexually Abused: No    Family History: Family History  Problem Relation Age of Onset   Heart attack Mother    Stroke Mother    Aneurysm Father    Colon cancer Neg Hx     Current Medications:  Current Outpatient Medications:    Cholecalciferol (VITAMIN D3) 50 MCG (2000 UT) TABS, Take 6,000 Units by mouth daily., Disp: , Rfl:    fluticasone (CUTIVATE) 0.05 % cream, Apply 1 Application  topically 2 (two) times daily as needed., Disp: , Rfl:    latanoprost (XALATAN) 0.005 % ophthalmic solution, Place 1 drop into both eyes daily., Disp: , Rfl:    Magnesium 250 MG TABS, Take 1 tablet by mouth daily., Disp: , Rfl:    milk thistle 175 MG tablet, Take 175 mg by mouth daily., Disp: , Rfl:    POTASSIUM CHLORIDE  PO, Take 1 tablet by mouth daily., Disp: , Rfl:    prednisoLONE acetate (PRED FORTE) 1 % ophthalmic suspension, Place 1 drop into the left eye 4 (four) times daily., Disp: , Rfl:    Quercetin 250 MG TABS, Take 500 mg by mouth daily., Disp: , Rfl:    Allergies: No Known Allergies  REVIEW OF SYSTEMS:   Review of Systems  Psychiatric/Behavioral:  Positive for sleep disturbance.      VITALS:   There were no vitals taken for this visit.  Wt Readings from Last 3 Encounters:  02/21/24 144 lb 3.2 oz (65.4 kg)  11/09/23 145 lb (65.8 kg)  10/05/23 144 lb (65.3 kg)    There is no height or weight on file to calculate BMI.  Performance status (ECOG): 1 - Symptomatic but completely ambulatory  PHYSICAL EXAM:   Physical Exam Constitutional:      Appearance: Normal appearance.  HENT:     Head: Normocephalic and atraumatic.  Eyes:     Pupils: Pupils are equal, round, and reactive to light.  Cardiovascular:     Rate and Rhythm: Normal rate and regular rhythm.     Heart sounds: Normal heart sounds. No murmur heard. Pulmonary:     Effort: Pulmonary effort is normal.     Breath sounds: Normal breath sounds. No wheezing.  Abdominal:     General: Bowel sounds are normal.  There is no distension.     Palpations: Abdomen is soft.     Tenderness: There is no abdominal tenderness.  Musculoskeletal:        General: Normal range of motion.     Cervical back: Normal range of motion.  Skin:    General: Skin is warm and dry.     Findings: No rash.  Neurological:     Mental Status: She is alert and oriented to person, place, and time.  Psychiatric:        Judgment: Judgment  normal.     LABS:      Latest Ref Rng & Units 03/10/2024    8:02 AM 02/21/2024    9:33 AM 11/23/2023   11:15 AM  CBC  WBC 4.0 - 10.5 K/uL 4.6  4.1  5.7   Hemoglobin 12.0 - 15.0 g/dL 85.3  86.2  85.7   Hematocrit 36.0 - 46.0 % 43.9  41.2  44.2   Platelets 150 - 400 K/uL 166  164  198       Latest Ref Rng & Units 03/10/2024    8:02 AM 02/21/2024    9:33 AM 02/18/2024   12:08 PM  CMP  Glucose 70 - 99 mg/dL 88  91    BUN 8 - 23 mg/dL 14  11    Creatinine 9.55 - 1.00 mg/dL 9.21  9.21    Sodium 864 - 145 mmol/L 144  141    Potassium 3.5 - 5.1 mmol/L 3.8  4.4    Chloride 98 - 111 mmol/L 106  103    CO2 22 - 32 mmol/L 28  24    Calcium 8.9 - 10.3 mg/dL 9.7  9.7    Total Protein 6.5 - 8.1 g/dL 8.1  7.6  7.4   Total Bilirubin 0.0 - 1.2 mg/dL 0.5  0.4  0.6   Alkaline Phos 38 - 126 U/L 95  92  84   AST 15 - 41 U/L 38  32  30   ALT 0 - 44 U/L 42  31  23      Lab Results  Component Value Date   CEA1 7.4 (H) 03/10/2024   /  CEA  Date Value Ref Range Status  03/10/2024 7.4 (H) 0.0 - 4.7 ng/mL Final    Comment:    (NOTE)                             Nonsmokers          <3.9                             Smokers             <5.6 Roche [employer]ostics Electrochemiluminescence Immunoassay (ECLIA) Values obtained with different assay methods or kits cannot be used interchangeably.  Results cannot be interpreted as absolute evidence of the presence or absence of malignant disease. Performed At: [facility] [address] [place], [place] [id number] Jennette Shorter MD Ey:[phone]    No results found for: PSA1 No results found for: CAN199 No results found for: CAN125  No results found for: STEPHANY RINGS, A1GS, A2GS, BETS, BETA2SER, GAMS, MSPIKE, SPEI Lab Results  Component Value Date   TIBC 331 06/01/2018   TIBC 393 02/16/2018   TIBC 353 11/08/2017   FERRITIN 56 06/01/2018   FERRITIN 37 02/16/2018   FERRITIN 42 11/08/2017  IRONPCTSAT 24 06/01/2018   IRONPCTSAT 20 02/16/2018   IRONPCTSAT 16 11/08/2017   Lab Results  Component Value Date   LDH 182 01/15/2020   LDH 173 09/05/2019     STUDIES:   No results found.

## 2024-04-06 ENCOUNTER — Ambulatory Visit: Admitting: Family Medicine

## 2024-04-06 ENCOUNTER — Encounter: Payer: Self-pay | Admitting: Family Medicine

## 2024-04-06 VITALS — BP 105/67 | HR 74 | Ht 65.0 in | Wt 144.0 lb

## 2024-04-06 DIAGNOSIS — R7301 Impaired fasting glucose: Secondary | ICD-10-CM

## 2024-04-06 DIAGNOSIS — E038 Other specified hypothyroidism: Secondary | ICD-10-CM

## 2024-04-06 DIAGNOSIS — E559 Vitamin D deficiency, unspecified: Secondary | ICD-10-CM

## 2024-04-06 DIAGNOSIS — K219 Gastro-esophageal reflux disease without esophagitis: Secondary | ICD-10-CM | POA: Diagnosis not present

## 2024-04-06 DIAGNOSIS — E782 Mixed hyperlipidemia: Secondary | ICD-10-CM

## 2024-04-06 NOTE — Progress Notes (Signed)
 New Patient Office Visit  Subjective:  Patient ID: Ariana Long, female    DOB: Feb 15, 1956  Age: 68 y.o. MRN: [id number]  CC:  Chief Complaint  Patient presents with   Establish Care    HPI Ariana Long is a 68 y.o. female with past medical history of , Anal Fissure, GERD presents for establishing care.  For the details of today's visit, please refer to the assessment and plan.     Past Medical History:  Diagnosis Date   Cirrhosis of liver (HCC) 01/26/2011   FIBROSIS on liver biopsy in 2007, U/S  03/31/2013= mild diffuse hepatic steatosis and /or hepatocellular disease without focal hepatic parenchymal abnormality. per Dr. Noralyn (2012) pt is immune to hep A and Hep B. per 12/06/13 note from Liver clinic-pt had MRI which showed cirrhosis 2015.   Diverticulitis of colon    Elevated AFP 01/26/2011   Gall stones    Hematuria 12/12/2014   Hemorrhoid 12/01/2012   Hemorrhoids    Hepatitis C 01/26/2011   2005 non responder, genotype 1, Gr 1-2, Stage 3 . Treated with Harvoni at the Liver clinic, responder   Hidradenitis    History of hiatal hernia    History of kidney stones    Hx: UTI (urinary tract infection)    Kidney stones 10/2011   Osteoporosis    Palpitations    Pre-diabetes    Primary cancer of hepatic flexure of colon (HCC) 06/15/2017   Serrated adenoma of colon 11/2011   Splenomegaly 01/26/2011   Thrombocytopenia 01/26/2011   Tubular adenoma    Vulval lesion 12/14/2013   Has kissing lesions thin and puffy and paler i color about 5-6- mm in diameter.Dr edsel in to co examine will try temovate  and recheck in 3 months    Past Surgical History:  Procedure Laterality Date   AXILLARY SURGERY Bilateral 1979   glands removed   BREAST BIOPSY Right 2011   BREAST EXCISIONAL BIOPSY Bilateral 25 years ago   Benign, fatty tumors   CESAREAN SECTION     x 2   COLECTOMY  06/15/2017   LAPAROSCOPIC ASSISTED RIGHT COLECTOMY, ERAS PATHWAY/notes 06/15/2017   COLONOSCOPY   07/31/2005   Rourk-Normal rectum/Normal colonoscopy/Repeat screening colonoscopy ten years   COLONOSCOPY  11/16/2011   RMR: Friable anorectum-likely source of hematochezia/ LARGE 1.2x2.5CM SERRATED ADENOMA resected from ascending colon 7CM DISTAL TO ICV, hyperplastic polyp removed    COLONOSCOPY N/A 07/18/2012   Dr. Shaaron- normal appearing rectal mucosa. no residual polpy tissue seen at tatoo location. remainder of colonic mucosa appeared entirely normal. bx = tubular adenoma   COLONOSCOPY N/A 08/29/2015   Dr. Shaaron: single 12 mm polyp in ascending colon, 4 mm polyp ascending. Sessile serrated adenoma. 3 year surveillance   COLONOSCOPY N/A 05/19/2017   4X2 cm sessile lesion at hepatic flexdure, unable to be completely removed   COLONOSCOPY N/A 01/18/2019   Dr. Shaaron: Several small polyps removed, hyperplastic.  Next colonoscopy in 5 years.   COLONOSCOPY WITH PROPOFOL  N/A 04/09/2023   Procedure: COLONOSCOPY WITH PROPOFOL ;  Surgeon: Shaaron Lamar CHRISTELLA, MD;  [facility];  Service: Endoscopy;  Laterality: N/A;  7:30 am, asa 3   ESOPHAGEAL BANDING N/A 08/29/2015   Procedure: ESOPHAGEAL BANDING;  Surgeon: Lamar CHRISTELLA Shaaron, MD;  [facility];  Service: Endoscopy;  Laterality: N/A;   ESOPHAGOGASTRODUODENOSCOPY N/A 08/29/2015   Dr. Shaaron: non-critical Schatzki's ring, somewhat prominent small submucosal vessels without obvious varices. hyperplastic gastric polyp. 2 year surveillance  ESOPHAGOGASTRODUODENOSCOPY (EGD) WITH PROPOFOL  N/A 04/09/2023   Procedure: ESOPHAGOGASTRODUODENOSCOPY (EGD) WITH PROPOFOL ;  Surgeon: Shaaron Lamar HERO, MD;  [facility];  Service: Endoscopy;  Laterality: N/A;   EXTRACORPOREAL SHOCK WAVE LITHOTRIPSY Right 05/27/2021   Procedure: EXTRACORPOREAL SHOCK WAVE LITHOTRIPSY (ESWL);  Surgeon: Sherrilee Belvie CROME, MD;  Location: [facility];  Service: Urology;  Laterality: Right;   HEMORRHOID BANDING     LAPAROSCOPIC RIGHT COLECTOMY N/A 06/15/2017   Procedure:  LAPAROSCOPIC ASSISTED RIGHT COLECTOMY, ERAS PATHWAY;  Surgeon: Gail Favorite, MD;  Location: [facility] [place] [facility];  Service: General;  Laterality: N/A;   POLYPECTOMY  05/19/2017   Procedure: POLYPECTOMY;  Surgeon: Shaaron Lamar HERO, MD;  [facility];  Service: Endoscopy;;   POLYPECTOMY  01/18/2019   Procedure: POLYPECTOMY;  Surgeon: Shaaron Lamar HERO, MD;  [facility];  Service: Endoscopy;;   POLYPECTOMY  04/09/2023   Procedure: POLYPECTOMY;  Surgeon: Shaaron Lamar HERO, MD;  [facility];  Service: Endoscopy;;   SKIN LESION EXCISION      Family History  Problem Relation Age of Onset   Heart attack Mother    Stroke Mother    Aneurysm Father    Colon cancer Neg Hx     Social History   Socioeconomic History   Marital status: Married    Spouse name: Not on file   Number of children: 2   Years of education: Not on file   Highest education level: Not on file  Occupational History   Occupation: Systems Analyst Op    Employer: [employer]  Tobacco Use   Smoking status: Never   Smokeless tobacco: Never  Vaping Use   Vaping status: Never Used  Substance and Sexual Activity   Alcohol use: No   Drug use: No   Sexual activity: Yes    Birth control/protection: Post-menopausal  Other Topics Concern   Not on file  Social History Narrative   Not on file   Social Drivers of Health   Financial Resource Strain: Low Risk  (05/21/2023)   Overall Financial Resource Strain (CARDIA)    Difficulty of Paying Living Expenses: Not hard at all  Food Insecurity: No Food Insecurity (05/21/2023)   Hunger Vital Sign    Worried About Running Out of Food in the Last Year: Never true    Ran Out of Food in the Last Year: Never true  Transportation Needs: No Transportation Needs (04/22/2022)   PRAPARE - Administrator, Civil Service (Medical): No    Lack of Transportation (Non-Medical): No  Physical Activity: Insufficiently Active (05/21/2023)   Exercise Vital Sign    Days of  Exercise per Week: 2 days    Minutes of Exercise per Session: 10 min  Stress: No Stress Concern Present (05/21/2023)   [employer] of Occupational Health - Occupational Stress Questionnaire    Feeling of Stress : Only a little  Social Connections: Socially Integrated (05/21/2023)   Social Connection and Isolation Panel    Frequency of Communication with Friends and Family: More than three times a week    Frequency of Social Gatherings with Friends and Family: Once a week    Attends Religious Services: More than 4 times per year    Active Member of [employer] or Organizations: Yes    Attends Banker Meetings: 1 to 4 times per year    Marital Status: Married  Catering Manager Violence: Not At Risk (05/21/2023)   Humiliation, Afraid, Rape, and Kick questionnaire  Fear of Current or Ex-Partner: No    Emotionally Abused: No    Physically Abused: No    Sexually Abused: No    ROS Review of Systems  Constitutional:  Negative for chills and fever.  Eyes:  Negative for visual disturbance.  Respiratory:  Negative for chest tightness and shortness of breath.   Neurological:  Negative for dizziness and headaches.    Objective:   Today's Vitals: BP 105/67   Pulse 74   Ht 5' 5 (1.651 m)   Wt 144 lb (65.3 kg)   SpO2 95%   BMI 23.96 kg/m   Physical Exam HENT:     Head: Normocephalic.     Mouth/Throat:     Mouth: Mucous membranes are moist.  Cardiovascular:     Rate and Rhythm: Normal rate.     Heart sounds: Normal heart sounds.  Pulmonary:     Effort: Pulmonary effort is normal.     Breath sounds: Normal breath sounds.  Neurological:     Mental Status: She is alert.      Assessment & Plan:   Gastroesophageal reflux disease without esophagitis Assessment & Plan: GERD diet encouraged   IFG (impaired fasting glucose) -     Hemoglobin A1c  Vitamin D deficiency -     VITAMIN D 25 Hydroxy (Vit-D Deficiency, Fractures)  TSH (thyroid -stimulating hormone  deficiency) -     TSH + free T4  Mixed hyperlipidemia -     Lipid panel  Note: This chart has been completed using Engineer, Civil (consulting) software, and while attempts have been made to ensure accuracy, certain words and phrases may not be transcribed as intended.     Follow-up: No follow-ups on file.   Emrah Ariola  Z Bacchus, FNP

## 2024-04-06 NOTE — Assessment & Plan Note (Signed)
 GERD diet encouraged

## 2024-04-06 NOTE — Patient Instructions (Addendum)
 I appreciate the opportunity to provide care to you today!    Follow up:  5 months  Fasting Labs: please stop by the lab during the week to get your blood drawn (TSH, Lipid profile, HgA1c, Vit D)  Schedule annual wellness  For a Healthier YOU, I Recommend: Reducing your intake of sugar, sodium, carbohydrates, and saturated fats. Increasing your fiber intake by incorporating more whole grains, fruits, and vegetables into your meals. Setting healthy goals with a focus on lowering your consumption of carbs, sugar, and unhealthy fats. Adding variety to your diet by including a wide range of fruits and vegetables. Cutting back on soda and limiting processed foods as much as possible. Staying active: In addition to taking your weight loss medication, aim for at least 150 minutes of moderate-intensity physical activity each week for optimal results.    Please follow up if your symptoms worsen or fail to improve.    Please continue to a heart-healthy diet and increase your physical activities. Try to exercise for at least five days a week.    It was a pleasure to see you and I look forward to continuing to work together on your health and well-being. Please do not hesitate to call the office if you need care or have questions about your care.  In case of emergency, please visit the Emergency Department for urgent care, or contact our clinic at [phone] to schedule an appointment. We're here to help you!   Have a wonderful day and week. With Gratitude, Meade JENEANE Gerlach MSN, FNP-BC, PMHNP-BC

## 2024-04-11 DIAGNOSIS — R7301 Impaired fasting glucose: Secondary | ICD-10-CM | POA: Diagnosis not present

## 2024-04-11 DIAGNOSIS — E038 Other specified hypothyroidism: Secondary | ICD-10-CM | POA: Diagnosis not present

## 2024-04-11 DIAGNOSIS — E559 Vitamin D deficiency, unspecified: Secondary | ICD-10-CM | POA: Diagnosis not present

## 2024-04-11 DIAGNOSIS — E782 Mixed hyperlipidemia: Secondary | ICD-10-CM | POA: Diagnosis not present

## 2024-04-12 LAB — VITAMIN D 25 HYDROXY (VIT D DEFICIENCY, FRACTURES): Vit D, 25-Hydroxy: 44.2 ng/mL (ref 30.0–100.0)

## 2024-04-12 LAB — TSH+FREE T4
Free T4: 0.89 ng/dL (ref 0.82–1.77)
TSH: 1.76 u[IU]/mL (ref 0.450–4.500)

## 2024-04-12 LAB — HEMOGLOBIN A1C
Est. average glucose Bld gHb Est-mCnc: 120 mg/dL
Hgb A1c MFr Bld: 5.8 % — ABNORMAL HIGH (ref 4.8–5.6)

## 2024-04-12 LAB — LIPID PANEL
Chol/HDL Ratio: 4.1 ratio (ref 0.0–4.4)
Cholesterol, Total: 242 mg/dL — ABNORMAL HIGH (ref 100–199)
HDL: 59 mg/dL (ref >39)
LDL Chol Calc (NIH): 152 mg/dL — ABNORMAL HIGH (ref 0–99)
Triglycerides: 172 mg/dL — ABNORMAL HIGH (ref 0–149)
VLDL Cholesterol Cal: 31 mg/dL (ref 5–40)

## 2024-04-29 ENCOUNTER — Ambulatory Visit: Payer: Self-pay | Admitting: Family Medicine

## 2024-04-29 DIAGNOSIS — E7849 Other hyperlipidemia: Secondary | ICD-10-CM

## 2024-04-29 MED ORDER — ROSUVASTATIN CALCIUM 10 MG PO TABS: 10.0000 mg | ORAL_TABLET | Freq: Every day | ORAL | 3 refills | Status: AC

## 2024-05-01 ENCOUNTER — Encounter: Payer: Self-pay | Admitting: *Deleted

## 2024-05-24 ENCOUNTER — Other Ambulatory Visit: Payer: Self-pay | Admitting: *Deleted

## 2024-05-24 DIAGNOSIS — K7460 Unspecified cirrhosis of liver: Secondary | ICD-10-CM

## 2024-05-30 ENCOUNTER — Ambulatory Visit: Admitting: Adult Health

## 2024-06-21 ENCOUNTER — Ambulatory Visit: Admitting: Adult Health

## 2024-09-05 ENCOUNTER — Ambulatory Visit: Payer: Self-pay

## 2025-03-09 ENCOUNTER — Inpatient Hospital Stay

## 2025-03-16 ENCOUNTER — Inpatient Hospital Stay: Admitting: Oncology
